# Patient Record
Sex: Female | Born: 1990
Health system: Southern US, Community
[De-identification: ages and names within clinical notes are randomized; demographics above are authoritative.]

## PROBLEM LIST (undated history)

## (undated) DIAGNOSIS — G709 Myoneural disorder, unspecified: Secondary | ICD-10-CM

## (undated) DIAGNOSIS — D573 Sickle-cell trait: Secondary | ICD-10-CM

## (undated) DIAGNOSIS — N189 Chronic kidney disease, unspecified: Secondary | ICD-10-CM

## (undated) DIAGNOSIS — E119 Type 2 diabetes mellitus without complications: Secondary | ICD-10-CM

## (undated) DIAGNOSIS — D649 Anemia, unspecified: Secondary | ICD-10-CM

## (undated) DIAGNOSIS — O1490 Unspecified pre-eclampsia, unspecified trimester: Secondary | ICD-10-CM

## (undated) DIAGNOSIS — O1400 Mild to moderate pre-eclampsia, unspecified trimester: Secondary | ICD-10-CM

## (undated) HISTORY — PX: WISDOM TOOTH EXTRACTION: SHX21

## (undated) HISTORY — DX: Sickle-cell trait: D57.3

## (undated) HISTORY — DX: Mild to moderate pre-eclampsia, unspecified trimester: O14.00

## (undated) HISTORY — DX: Unspecified pre-eclampsia, unspecified trimester: O14.90

## (undated) HISTORY — DX: Type 2 diabetes mellitus without complications: E11.9

---

## 2010-02-20 ENCOUNTER — Emergency Department (HOSPITAL_COMMUNITY): Admission: EM | Admit: 2010-02-20 | Discharge: 2010-02-20 | Payer: Self-pay | Admitting: Emergency Medicine

## 2011-05-17 ENCOUNTER — Inpatient Hospital Stay (HOSPITAL_COMMUNITY): Payer: PRIVATE HEALTH INSURANCE

## 2011-05-17 ENCOUNTER — Inpatient Hospital Stay (HOSPITAL_COMMUNITY)
Admission: EM | Admit: 2011-05-17 | Discharge: 2011-05-23 | DRG: 638 | Disposition: A | Discharge status: Home / Self Care | Payer: PRIVATE HEALTH INSURANCE | Attending: Internal Medicine | Admitting: Internal Medicine

## 2011-05-17 ENCOUNTER — Emergency Department (HOSPITAL_COMMUNITY): Payer: PRIVATE HEALTH INSURANCE

## 2011-05-17 DIAGNOSIS — R112 Nausea with vomiting, unspecified: Secondary | ICD-10-CM | POA: Diagnosis present

## 2011-05-17 DIAGNOSIS — Z794 Long term (current) use of insulin: Secondary | ICD-10-CM

## 2011-05-17 DIAGNOSIS — E871 Hypo-osmolality and hyponatremia: Secondary | ICD-10-CM | POA: Diagnosis present

## 2011-05-17 DIAGNOSIS — E869 Volume depletion, unspecified: Secondary | ICD-10-CM | POA: Diagnosis present

## 2011-05-17 DIAGNOSIS — E875 Hyperkalemia: Secondary | ICD-10-CM | POA: Diagnosis present

## 2011-05-17 DIAGNOSIS — K5900 Constipation, unspecified: Secondary | ICD-10-CM | POA: Diagnosis present

## 2011-05-17 DIAGNOSIS — E1010 Type 1 diabetes mellitus with ketoacidosis without coma: Principal | ICD-10-CM | POA: Diagnosis present

## 2011-05-17 DIAGNOSIS — I10 Essential (primary) hypertension: Secondary | ICD-10-CM | POA: Diagnosis not present

## 2011-05-17 LAB — DIFFERENTIAL
Basophils Absolute: 0 10*3/uL (ref 0.0–0.1)
Basophils Relative: 0 % (ref 0–1)
Lymphocytes Relative: 5 % — ABNORMAL LOW (ref 12–46)
Monocytes Relative: 3 % (ref 3–12)
Neutro Abs: 14.4 10*3/uL — ABNORMAL HIGH (ref 1.7–7.7)
Neutrophils Relative %: 92 % — ABNORMAL HIGH (ref 43–77)

## 2011-05-17 LAB — GLUCOSE, CAPILLARY
Glucose-Capillary: 487 mg/dL — ABNORMAL HIGH (ref 70–99)
Glucose-Capillary: 327 mg/dL — ABNORMAL HIGH (ref 70–99)
Glucose-Capillary: 205 mg/dL — ABNORMAL HIGH (ref 70–99)

## 2011-05-17 LAB — BASIC METABOLIC PANEL
BUN: 8 mg/dL (ref 6–23)
CO2: 12 mEq/L — ABNORMAL LOW (ref 19–32)
Chloride: 105 mEq/L (ref 96–112)
Creatinine, Ser: 0.47 mg/dL — ABNORMAL LOW (ref 0.50–1.10)
GFR calc Af Amer: >60 mL/min (ref >60)
GFR calc non Af Amer: >60 mL/min (ref >60)
Glucose, Bld: 513 mg/dL — ABNORMAL HIGH (ref 70–99)
Potassium: 4.5 mEq/L (ref 3.5–5.1)
Sodium: 131 mEq/L — ABNORMAL LOW (ref 135–145)

## 2011-05-17 LAB — CBC
Hemoglobin: 13.5 g/dL (ref 12.0–15.0)
RBC: 5.03 MIL/uL (ref 3.87–5.11)

## 2011-05-17 LAB — URINALYSIS, ROUTINE W REFLEX MICROSCOPIC
Glucose, UA: >1000 mg/dL — AB
Specific Gravity, Urine: 1.027 (ref 1.005–1.030)
pH: 5 (ref 5.0–8.0)

## 2011-05-17 LAB — URINE MICROSCOPIC-ADD ON

## 2011-05-18 LAB — CARDIAC PANEL(CRET KIN+CKTOT+MB+TROPI)
CK, MB: 1.1 ng/mL (ref 0.3–4.0)
Relative Index: INVALID (ref 0.0–2.5)
Troponin I: <0.3 ng/mL (ref <0.30)

## 2011-05-18 LAB — BASIC METABOLIC PANEL
BUN: 4 mg/dL — ABNORMAL LOW (ref 6–23)
CO2: 19 mEq/L (ref 19–32)
CO2: 22 mEq/L (ref 19–32)
CO2: 23 mEq/L (ref 19–32)
CO2: 24 mEq/L (ref 19–32)
CO2: 24 mEq/L (ref 19–32)
Calcium: 9 mg/dL (ref 8.4–10.5)
Chloride: 101 mEq/L (ref 96–112)
Chloride: 99 mEq/L (ref 96–112)
Chloride: 98 mEq/L (ref 96–112)
Chloride: 98 mEq/L (ref 96–112)
Chloride: 98 mEq/L (ref 96–112)
Creatinine, Ser: <0.47 mg/dL — ABNORMAL LOW (ref 0.50–1.10)
Creatinine, Ser: <0.47 mg/dL — ABNORMAL LOW (ref 0.50–1.10)
Creatinine, Ser: <0.47 mg/dL — ABNORMAL LOW (ref 0.50–1.10)
Glucose, Bld: 138 mg/dL — ABNORMAL HIGH (ref 70–99)
Glucose, Bld: 145 mg/dL — ABNORMAL HIGH (ref 70–99)
Potassium: 3.5 mEq/L (ref 3.5–5.1)
Potassium: 3 mEq/L — ABNORMAL LOW (ref 3.5–5.1)
Potassium: 2.8 mEq/L — ABNORMAL LOW (ref 3.5–5.1)
Sodium: 135 mEq/L (ref 135–145)
Sodium: 134 mEq/L — ABNORMAL LOW (ref 135–145)
Sodium: 133 mEq/L — ABNORMAL LOW (ref 135–145)
Sodium: 132 mEq/L — ABNORMAL LOW (ref 135–145)

## 2011-05-18 LAB — GLUCOSE, CAPILLARY
Glucose-Capillary: 183 mg/dL — ABNORMAL HIGH (ref 70–99)
Glucose-Capillary: 164 mg/dL — ABNORMAL HIGH (ref 70–99)
Glucose-Capillary: 157 mg/dL — ABNORMAL HIGH (ref 70–99)
Glucose-Capillary: 183 mg/dL — ABNORMAL HIGH (ref 70–99)
Glucose-Capillary: 138 mg/dL — ABNORMAL HIGH (ref 70–99)
Glucose-Capillary: 174 mg/dL — ABNORMAL HIGH (ref 70–99)
Glucose-Capillary: 155 mg/dL — ABNORMAL HIGH (ref 70–99)
Glucose-Capillary: 145 mg/dL — ABNORMAL HIGH (ref 70–99)
Glucose-Capillary: 137 mg/dL — ABNORMAL HIGH (ref 70–99)
Glucose-Capillary: 181 mg/dL — ABNORMAL HIGH (ref 70–99)

## 2011-05-18 LAB — HEMOGLOBIN A1C
Hgb A1c MFr Bld: 12.2 % — ABNORMAL HIGH (ref <5.7)
Mean Plasma Glucose: 303 mg/dL — ABNORMAL HIGH (ref <117)

## 2011-05-18 LAB — COMPREHENSIVE METABOLIC PANEL
AST: 19 U/L (ref 0–37)
Albumin: 3.2 g/dL — ABNORMAL LOW (ref 3.5–5.2)
Calcium: 8.6 mg/dL (ref 8.4–10.5)
Creatinine, Ser: 0.52 mg/dL (ref 0.50–1.10)

## 2011-05-18 LAB — DIFFERENTIAL
Basophils Absolute: 0 10*3/uL (ref 0.0–0.1)
Basophils Relative: 0 % (ref 0–1)
Eosinophils Absolute: 0 10*3/uL (ref 0.0–0.7)
Eosinophils Relative: 0 % (ref 0–5)
Lymphocytes Relative: 16 % (ref 12–46)

## 2011-05-18 LAB — TSH: TSH: 0.89 u[IU]/mL (ref 0.350–4.500)

## 2011-05-18 LAB — CBC
Platelets: 285 10*3/uL (ref 150–400)
RDW: 13.3 % (ref 11.5–15.5)
WBC: 11.7 10*3/uL — ABNORMAL HIGH (ref 4.0–10.5)

## 2011-05-18 LAB — MAGNESIUM: Magnesium: 1.8 mg/dL (ref 1.5–2.5)

## 2011-05-19 LAB — GLUCOSE, CAPILLARY
Glucose-Capillary: 113 mg/dL — ABNORMAL HIGH (ref 70–99)
Glucose-Capillary: 131 mg/dL — ABNORMAL HIGH (ref 70–99)
Glucose-Capillary: 145 mg/dL — ABNORMAL HIGH (ref 70–99)
Glucose-Capillary: 146 mg/dL — ABNORMAL HIGH (ref 70–99)
Glucose-Capillary: 150 mg/dL — ABNORMAL HIGH (ref 70–99)
Glucose-Capillary: 158 mg/dL — ABNORMAL HIGH (ref 70–99)
Glucose-Capillary: 207 mg/dL — ABNORMAL HIGH (ref 70–99)
Glucose-Capillary: 271 mg/dL — ABNORMAL HIGH (ref 70–99)
Glucose-Capillary: 315 mg/dL — ABNORMAL HIGH (ref 70–99)
Glucose-Capillary: 41 mg/dL — CL (ref 70–99)
Glucose-Capillary: 183 mg/dL — ABNORMAL HIGH (ref 70–99)
Glucose-Capillary: 142 mg/dL — ABNORMAL HIGH (ref 70–99)
Glucose-Capillary: 143 mg/dL — ABNORMAL HIGH (ref 70–99)
Glucose-Capillary: 162 mg/dL — ABNORMAL HIGH (ref 70–99)
Glucose-Capillary: 175 mg/dL — ABNORMAL HIGH (ref 70–99)
Glucose-Capillary: 187 mg/dL — ABNORMAL HIGH (ref 70–99)
Glucose-Capillary: 190 mg/dL — ABNORMAL HIGH (ref 70–99)
Glucose-Capillary: 257 mg/dL — ABNORMAL HIGH (ref 70–99)

## 2011-05-19 LAB — BASIC METABOLIC PANEL WITH GFR
BUN: 3 mg/dL — ABNORMAL LOW (ref 6–23)
BUN: 3 mg/dL — ABNORMAL LOW (ref 6–23)
BUN: <3 mg/dL — ABNORMAL LOW (ref 6–23)
BUN: <3 mg/dL — ABNORMAL LOW (ref 6–23)
CO2: 24 meq/L (ref 19–32)
CO2: 24 meq/L (ref 19–32)
CO2: 24 meq/L (ref 19–32)
CO2: 25 meq/L (ref 19–32)
Calcium: 8.4 mg/dL (ref 8.4–10.5)
Calcium: 8.5 mg/dL (ref 8.4–10.5)
Calcium: 9.1 mg/dL (ref 8.4–10.5)
Calcium: 9.4 mg/dL (ref 8.4–10.5)
Chloride: 95 meq/L — ABNORMAL LOW (ref 96–112)
Chloride: 96 meq/L (ref 96–112)
Chloride: 96 meq/L (ref 96–112)
Chloride: 98 meq/L (ref 96–112)
Creatinine, Ser: <0.47 mg/dL — ABNORMAL LOW (ref 0.50–1.10)
Creatinine, Ser: <0.47 mg/dL — ABNORMAL LOW (ref 0.50–1.10)
Creatinine, Ser: <0.47 mg/dL — ABNORMAL LOW (ref 0.50–1.10)
Creatinine, Ser: <0.47 mg/dL — ABNORMAL LOW (ref 0.50–1.10)
Glucose, Bld: 150 mg/dL — ABNORMAL HIGH (ref 70–99)
Glucose, Bld: 157 mg/dL — ABNORMAL HIGH (ref 70–99)
Glucose, Bld: 167 mg/dL — ABNORMAL HIGH (ref 70–99)
Glucose, Bld: 176 mg/dL — ABNORMAL HIGH (ref 70–99)
Potassium: 2.9 meq/L — ABNORMAL LOW (ref 3.5–5.1)
Potassium: 3 meq/L — ABNORMAL LOW (ref 3.5–5.1)
Potassium: 3.1 meq/L — ABNORMAL LOW (ref 3.5–5.1)
Potassium: 3.3 meq/L — ABNORMAL LOW (ref 3.5–5.1)
Sodium: 130 meq/L — ABNORMAL LOW (ref 135–145)
Sodium: 132 meq/L — ABNORMAL LOW (ref 135–145)
Sodium: 132 meq/L — ABNORMAL LOW (ref 135–145)
Sodium: 133 meq/L — ABNORMAL LOW (ref 135–145)

## 2011-05-19 LAB — BASIC METABOLIC PANEL
BUN: 3 mg/dL — ABNORMAL LOW (ref 6–23)
BUN: <3 mg/dL — ABNORMAL LOW (ref 6–23)
Chloride: 95 mEq/L — ABNORMAL LOW (ref 96–112)
Chloride: 94 mEq/L — ABNORMAL LOW (ref 96–112)
GFR calc non Af Amer: >60 mL/min (ref >60)
Glucose, Bld: 259 mg/dL — ABNORMAL HIGH (ref 70–99)
Potassium: 2.9 mEq/L — ABNORMAL LOW (ref 3.5–5.1)
Potassium: 2.7 mEq/L — CL (ref 3.5–5.1)
Sodium: 129 mEq/L — ABNORMAL LOW (ref 135–145)

## 2011-05-20 ENCOUNTER — Inpatient Hospital Stay (HOSPITAL_COMMUNITY): Payer: PRIVATE HEALTH INSURANCE

## 2011-05-20 LAB — BASIC METABOLIC PANEL
BUN: <3 mg/dL — ABNORMAL LOW (ref 6–23)
BUN: <3 mg/dL — ABNORMAL LOW (ref 6–23)
BUN: <3 mg/dL — ABNORMAL LOW (ref 6–23)
BUN: <3 mg/dL — ABNORMAL LOW (ref 6–23)
CO2: 23 mEq/L (ref 19–32)
CO2: 23 mEq/L (ref 19–32)
CO2: 20 mEq/L (ref 19–32)
CO2: 23 mEq/L (ref 19–32)
Calcium: 8.2 mg/dL — ABNORMAL LOW (ref 8.4–10.5)
Calcium: 8.2 mg/dL — ABNORMAL LOW (ref 8.4–10.5)
Calcium: 8.4 mg/dL (ref 8.4–10.5)
Chloride: 94 mEq/L — ABNORMAL LOW (ref 96–112)
Chloride: 96 mEq/L (ref 96–112)
Creatinine, Ser: <0.47 mg/dL — ABNORMAL LOW (ref 0.50–1.10)
Creatinine, Ser: <0.47 mg/dL — ABNORMAL LOW (ref 0.50–1.10)
Creatinine, Ser: <0.47 mg/dL — ABNORMAL LOW (ref 0.50–1.10)
Glucose, Bld: 172 mg/dL — ABNORMAL HIGH (ref 70–99)
Glucose, Bld: 208 mg/dL — ABNORMAL HIGH (ref 70–99)
Glucose, Bld: 223 mg/dL — ABNORMAL HIGH (ref 70–99)
Potassium: 3.1 mEq/L — ABNORMAL LOW (ref 3.5–5.1)
Potassium: 3.3 mEq/L — ABNORMAL LOW (ref 3.5–5.1)
Sodium: 131 mEq/L — ABNORMAL LOW (ref 135–145)
Sodium: 132 mEq/L — ABNORMAL LOW (ref 135–145)

## 2011-05-20 LAB — GLUCOSE, CAPILLARY
Glucose-Capillary: 220 mg/dL — ABNORMAL HIGH (ref 70–99)
Glucose-Capillary: 161 mg/dL — ABNORMAL HIGH (ref 70–99)
Glucose-Capillary: 174 mg/dL — ABNORMAL HIGH (ref 70–99)
Glucose-Capillary: 209 mg/dL — ABNORMAL HIGH (ref 70–99)
Glucose-Capillary: 224 mg/dL — ABNORMAL HIGH (ref 70–99)
Glucose-Capillary: 149 mg/dL — ABNORMAL HIGH (ref 70–99)
Glucose-Capillary: 258 mg/dL — ABNORMAL HIGH (ref 70–99)
Glucose-Capillary: 176 mg/dL — ABNORMAL HIGH (ref 70–99)
Glucose-Capillary: 143 mg/dL — ABNORMAL HIGH (ref 70–99)

## 2011-05-20 LAB — CBC
MCH: 26.7 pg (ref 26.0–34.0)
MCV: 74.3 fL — ABNORMAL LOW (ref 78.0–100.0)
Platelets: 305 10*3/uL (ref 150–400)
RDW: 13.2 % (ref 11.5–15.5)

## 2011-05-20 LAB — LIPASE, BLOOD: Lipase: 16 U/L (ref 11–59)

## 2011-05-21 LAB — GLUCOSE, CAPILLARY
Glucose-Capillary: 130 mg/dL — ABNORMAL HIGH (ref 70–99)
Glucose-Capillary: 136 mg/dL — ABNORMAL HIGH (ref 70–99)
Glucose-Capillary: 145 mg/dL — ABNORMAL HIGH (ref 70–99)
Glucose-Capillary: 149 mg/dL — ABNORMAL HIGH (ref 70–99)
Glucose-Capillary: 196 mg/dL — ABNORMAL HIGH (ref 70–99)
Glucose-Capillary: 158 mg/dL — ABNORMAL HIGH (ref 70–99)
Glucose-Capillary: 172 mg/dL — ABNORMAL HIGH (ref 70–99)
Glucose-Capillary: 196 mg/dL — ABNORMAL HIGH (ref 70–99)
Glucose-Capillary: 162 mg/dL — ABNORMAL HIGH (ref 70–99)

## 2011-05-21 LAB — BASIC METABOLIC PANEL
BUN: 4 mg/dL — ABNORMAL LOW (ref 6–23)
BUN: 3 mg/dL — ABNORMAL LOW (ref 6–23)
CO2: 25 mEq/L (ref 19–32)
CO2: 19 mEq/L (ref 19–32)
Calcium: 8.3 mg/dL — ABNORMAL LOW (ref 8.4–10.5)
Calcium: 8.9 mg/dL (ref 8.4–10.5)
Chloride: 97 mEq/L (ref 96–112)
Chloride: 97 mEq/L (ref 96–112)
Chloride: 98 mEq/L (ref 96–112)
Chloride: 97 mEq/L (ref 96–112)
Creatinine, Ser: 0.54 mg/dL (ref 0.50–1.10)
Creatinine, Ser: <0.47 mg/dL — ABNORMAL LOW (ref 0.50–1.10)
Glucose, Bld: 314 mg/dL — ABNORMAL HIGH (ref 70–99)
Potassium: 2.9 mEq/L — ABNORMAL LOW (ref 3.5–5.1)
Potassium: 3.8 mEq/L (ref 3.5–5.1)
Sodium: 131 mEq/L — ABNORMAL LOW (ref 135–145)

## 2011-05-21 LAB — CBC
HCT: 33.8 % — ABNORMAL LOW (ref 36.0–46.0)
MCH: 26.9 pg (ref 26.0–34.0)
MCV: 74.6 fL — ABNORMAL LOW (ref 78.0–100.0)
RDW: 13 % (ref 11.5–15.5)
WBC: 11.1 10*3/uL — ABNORMAL HIGH (ref 4.0–10.5)

## 2011-05-22 LAB — GLUCOSE, CAPILLARY
Glucose-Capillary: 101 mg/dL — ABNORMAL HIGH (ref 70–99)
Glucose-Capillary: 113 mg/dL — ABNORMAL HIGH (ref 70–99)
Glucose-Capillary: 149 mg/dL — ABNORMAL HIGH (ref 70–99)
Glucose-Capillary: 165 mg/dL — ABNORMAL HIGH (ref 70–99)
Glucose-Capillary: 215 mg/dL — ABNORMAL HIGH (ref 70–99)
Glucose-Capillary: 474 mg/dL — ABNORMAL HIGH (ref 70–99)
Glucose-Capillary: 512 mg/dL — ABNORMAL HIGH (ref 70–99)

## 2011-05-22 LAB — BASIC METABOLIC PANEL
CO2: 23 mEq/L (ref 19–32)
Chloride: 101 mEq/L (ref 96–112)
Glucose, Bld: 144 mg/dL — ABNORMAL HIGH (ref 70–99)
Potassium: 2.8 mEq/L — ABNORMAL LOW (ref 3.5–5.1)
Sodium: 136 mEq/L (ref 135–145)

## 2011-05-23 LAB — GLUCOSE, CAPILLARY
Glucose-Capillary: 154 mg/dL — ABNORMAL HIGH (ref 70–99)
Glucose-Capillary: 271 mg/dL — ABNORMAL HIGH (ref 70–99)

## 2011-05-23 LAB — BASIC METABOLIC PANEL
Calcium: 9.1 mg/dL (ref 8.4–10.5)
Calcium: 9 mg/dL (ref 8.4–10.5)
Chloride: 100 mEq/L (ref 96–112)
Creatinine, Ser: <0.47 mg/dL — ABNORMAL LOW (ref 0.50–1.10)
Sodium: 132 mEq/L — ABNORMAL LOW (ref 135–145)
Sodium: 131 mEq/L — ABNORMAL LOW (ref 135–145)

## 2011-05-26 NOTE — H&P (Signed)
Kimberly [name], Kimberly [name]             ACCOUNT NO.:  [id number]  MEDICAL RECORD NO.:  [id number]  LOCATION:  [id number]                         FACILITY:  [facility]  PHYSICIAN:  Tujuana Kilmartin, DO         DATE OF BIRTH:  1990-09-26  DATE OF ADMISSION:  05/17/2011 DATE OF DISCHARGE:                             HISTORY & PHYSICAL   CHIEF COMPLAINT:  Abdominal pain, nausea, and vomiting.  HISTORY OF PRESENT ILLNESS:  Patient is a 20 year old female, who has type 1 diabetes mellitus.  She woke this morning with nausea, vomiting, has developed abdominal pain.  Patient denies any fevers, chills, hematemesis, coffee-ground emesis, constipation, or diarrhea.  PAST MEDICAL HISTORY:  Significant only for type 1 diabetes mellitus.  PAST SURGICAL HISTORY:  None.  MEDICATIONS AT HOME:  Include: 1. Tylenol Extra Strength 500 mg 2 p.o. q.6 hours p.r.n., pain. 2. NovoLog 30 to 40 units subcu 4 times daily. 3. Lantus 26 units subcu at 6 p.m. daily.  ALLERGIES:  NKDA.  SOCIAL:  No tobacco, no alcohol, no recreational drug use.  FAMILY HISTORY:  Positive for type 1 diabetes mellitus.  REVIEW OF SYSTEMS:  CONSTITUTIONAL:  Negative for fever.  Negative for chills.  Negative for weakness.  Negative for fatigue.  CNS:  No headaches, no seizures, or limb weakness.  ENT:  No nasal congestion. No throat pain.  No coryza.  CARDIOVASCULAR:  No chest pain.  No palpitations or orthopnea.  RESPIRATORY:  No cough.  No shortness of breath.  No wheezing.  GASTROINTESTINAL:  Positive for nausea.  Positive for vomiting.  Positive for abdominal pain.  Negative for constipation. Negative for diarrhea.  GENITOURINARY:  No dysuria, no hematuria, no urinary frequency.  RENAL:  No flank pain, no swelling, no pruritus. SKIN:  No rashes, no sores or lesions.  HEMATOLOGIC:  No easy bruising. No purpura or clots.  LYMPHS:  No lymphadenopathy.  No palpable lymph nodes.  No specific lymph swelling.  PSYCHIATRIC:  No anxiety.   No depression or insomnia.  PHYSICAL EXAMINATION:  VITAL SIGNS:  Temperature 98.4, pulse 96, respiratory rate 18, blood pressure 150/92. GENERAL:  Patient is awake, alert, and ordered x3, in no acute distress. EYES:  Pupils are equal, round, and reactive to light and accommodation. External ocular movements are bilaterally intact.  Sclerae nonicteric, noninjected. MOUTH:  Oral mucous is dry.  No lesions or sores.  Pharynx is clean.  No erythema.  No exudate. NECK:  Negative for JVD.  Negative for thyromegaly.  Negative for lymphadenopathy. HEART:  Regular rate and rhythm at 90 beats per minute without murmur, ectopy, or gallops.  No lateral PMI.  No thrills. LUNGS:  Clear to auscultation bilaterally without wheezes, rales, or rhonchi.  No increase work of breathing.  No tactile fremitus. ABDOMEN:  Soft, nontender, nondistended.  Positive bowel sounds.  No hepatosplenomegaly.  No hernias palpated. EXTREMITIES:  Negative for cyanosis, clubbing, or edema.  Patient has positive dorsalis pedis and popliteal pulses bilaterally.  No carotid bruits bilaterally. NEUROLOGIC:  Cranial nerves II through XII gross intact.  Motor and sensory intact.  LABORATORY DATA:  WBC is 15.7, hemoglobin 13.5, hematocrit 38.8, platelets 292.  Sodium 131,  potassium 4.5, chloride 93, CO2 is 12, glucose is 513, creatinine 0.57, calcium 9.1.  Urinalysis shows greater than 1000 urine glucose, 80 ketones, trace of blood, no nitrites, no leukocyte esterase.  Chest x-ray shows no active cardiopulmonary disease.  ASSESSMENT: 1. Diabetic ketoacidosis, patient attributes this to a recent change     and her insulin from Humalog to NovoLog. 2. Severe metabolic acidosis. 3. Volume depletion. 4. Hyponatremia.  PLAN: 1. Admit to Step-Down. 2. IV insulin as per DKA protocol. 3. IV fluids. 4. Monitor electrolytes.  I spent 32 minutes on this admission.          ______________________________ Karie Kirks,  DO     AS/MEDQ  D:  05/17/2011  T:  05/18/2011  Job:  [id number]  cc:   Dr. Toney Rakes. [place], [place].  Electronically Signed by Karie Kirks DO on 05/26/2011 02:21:42 PM

## 2011-06-05 NOTE — Discharge Summary (Addendum)
Kimberly [name], Kimberly [name] NO.:  [id number]  MEDICAL RECORD NO.:  [id number]  LOCATION:  [id number]                         FACILITY:  [facility]  PHYSICIAN:  Leana Gamer, MDDATE OF BIRTH:  1991-01-16  DATE OF ADMISSION:  05/17/2011 DATE OF DISCHARGE:  05/23/2011                        DISCHARGE SUMMARY - REFERRING   PRIMARY CARE PROVIDER:  Jeanann Lewandowsky, MD, with Endocrinology.  DISCHARGE DIAGNOSES: 1. Diabetic ketoacidosis. 2. Persistent nausea, vomiting. 3. Hyperkalemia. 4. Hyponatremia. 5. Metabolic acidosis. 6. Gastric bloating secondary to constipation. 7. Hypertension.  DISCHARGE MEDICATIONS: 1. Insulin NovoLog FlexPen 1 unit per 10 g of carbohydrates meal     coverage.  Subcutaneously t.i.d. with meals. 2. NovoLog FlexPen 1-20 units subcutaneously 3 times a day as follows     CBG 70-120 zero units, CBG 121-150 give 3 units, CBG 151-200 give 4     units, CBG 201-250 give 7 units, CBG 251-300 give 11 units, CBG 301-     350 give 15 units, CBG 351-400 give 20 units, CBG greater than 400     call physician. 3. MiraLax 17 g p.o. daily as needed for constipation. 4. Lantus 26 units subcutaneously at 6 p.m. daily. 5. Tylenol Extra Strength 500 mg 2 tablets p.o. every 6 hours as     needed.  DIAGNOSTIC LABORATORY DATA:  WBC 15.7, hemoglobin 13.5, hematocrit 38.8, MCV 77.1, platelets 292, neutrophils 92, absolute neutrophils 14.4. Sodium 131, potassium 4.5, chloride 93, CO2 of 12, BUN 10, creatinine 0.57, glucose 513.  Urinalysis shows greater than 80 ketones, greater than 1000 glucose, otherwise negative.  Urine pregnancy test is negative.  Magnesium is 1.8, total CK 51, CK-MB 1.1, troponin 1 less than 0.30.  BNP 381.1.  TSH is 0.890.  Hemoglobin A1c is 12.2.  Amylase is 37, lipase is 16.  DIAGNOSTIC IMAGING: 1. Chest x-ray on September 18th, yields no active cardiopulmonary     disease. 2. Acute abdominal series done on September 21st, yields benign-   appearing abdomen.  No evidence to suggest ileus.  CONSULTATIONS:  None.  PROCEDURES:  None.  BRIEF HISTORY:  Kimberly [name] is a 20 year old female with a history of type 1 diabetes.  She awakened on the morning of September 18th feeling nausea, abdominal pain, and reports vomiting several times.  She denied fever, chills, coffee-ground emesis, constipation, or diarrhea.  Her symptoms persisted and worsened, so she came to the emergency room and [facility] were asked to admit for further evaluation and treatment.  HOSPITAL COURSE BY PROBLEM: 1. Diabetic ketoacidosis, presumably secondary to recent change in her     insulin from Humalog to NovoLog due to stipulations of the     insurance company.  The patient's glucose greater than 500 and she     was in metabolic acidosis.  She was admitted to Telemetry, started     on the Glucommander.  Her labs were followed closely.  She remained     on Glucommander until September 22nd secondary to persistent nausea     and vomiting and inability to eat.  On September 23rd, she was     transitioned over to 26 units of Lantus meal coverage of 2 units  per 15 of carb and a moderate sliding scale.  At the time of     discharge, the patient's coverage includes 26 of Lantus     subcutaneous meal coverage of 1 unit per 10 g of carbs and a     resistant sliding scale insulin regimen, dictated above.  The     patient will follow up with Dr. Carlis Abbott in 1 week. 2. Persistent nausea and vomiting secondary to diabetic ketoacidosis     as well as constipation.  On September 22nd, the patient complained     of some bloating and reported no BM for 5 days.  She was provided     with laxatives with good results.  Shortly thereafter, her nausea     subsided and her diet was advanced.  She had no further episodes of     nausea or vomiting. 3. Hypercalcemia secondary to diabetic ketoacidosis.  Potassium was     monitored closely and repleted according to the  DKA protocol. 4. Hyponatremia secondary to diabetic ketoacidosis.  Fluids managed     per DKA protocol.  At the time of discharge, the patient's sodium     is 132. 5. Gastric bloating secondary to constipation.  The patient was     provided with Dulcolax suppository with good results.  She will be     sent home on MiraLax 17 g p.o. p.r.n. constipation.  She had been     educated to proper diet to avoid constipation. 6. Hypertension.  During her hospitalization, the patient had a few     episodes of hypertension.  At the time of discharge that is     resolved.  No antihypertensives are needed at the time of     discharge.  The patient to follow up with Dr. Carlis Abbott for further     monitoring of her blood pressure.  PHYSICAL EXAMINATION:  VITAL SIGNS:  Temperature 98.0, blood pressure 104/68, heart rate 88, respiratory rate 16, saturation 100% on room air. GENERAL:  Awake, alert, well nourished, no acute distress. CV:  Heart, regular rate and rhythm.  No murmur, gallop, or rub.  No lower extremity edema. RESPIRATORY:  Normal effort.  Breath sounds clear to auscultation bilaterally.  No rhonchi, wheezes, or rales. ABDOMEN:  Round, soft, positive bowel sounds throughout, nontender to palpation. NEURO:  Alert and oriented x3.  Speech clear.  Facial symmetry.  DIET:  Carb modified.  ACTIVITY:  As tolerated.  FOLLOWUP:  The patient has an appointment with Dr. Jeanann Lewandowsky on September 26th at 10 a.m.  DISPOSITION:  The patient is medically stable and ready for discharge.  Time spent on this discharge 40 minutes.     Radene Gunning, NP   ______________________________ Leana Gamer, MD    KMB/MEDQ  D:  05/23/2011  T:  05/23/2011  Job:  [id number]  cc:   Jeanann Lewandowsky, M.D. Fax: [phone]  Electronically Signed by Dyanne Carrel  on 06/05/2011 08:25:50 AM Electronically Signed by Liston Alba MD on 06/09/2011 07:27:41 AM

## 2011-06-09 ENCOUNTER — Emergency Department (HOSPITAL_COMMUNITY): Payer: No Typology Code available for payment source

## 2011-06-09 ENCOUNTER — Emergency Department (HOSPITAL_COMMUNITY)
Admission: EM | Admit: 2011-06-09 | Discharge: 2011-06-10 | Disposition: A | Discharge status: Home / Self Care | Payer: No Typology Code available for payment source | Attending: Emergency Medicine | Admitting: Emergency Medicine

## 2011-06-09 DIAGNOSIS — Z794 Long term (current) use of insulin: Secondary | ICD-10-CM | POA: Insufficient documentation

## 2011-06-09 DIAGNOSIS — E119 Type 2 diabetes mellitus without complications: Secondary | ICD-10-CM | POA: Insufficient documentation

## 2011-06-09 DIAGNOSIS — S335XXA Sprain of ligaments of lumbar spine, initial encounter: Secondary | ICD-10-CM | POA: Insufficient documentation

## 2011-06-09 DIAGNOSIS — S139XXA Sprain of joints and ligaments of unspecified parts of neck, initial encounter: Secondary | ICD-10-CM | POA: Insufficient documentation

## 2011-06-09 DIAGNOSIS — Y92410 Unspecified street and highway as the place of occurrence of the external cause: Secondary | ICD-10-CM | POA: Insufficient documentation
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
## 2013-04-30 ENCOUNTER — Ambulatory Visit (INDEPENDENT_AMBULATORY_CARE_PROVIDER_SITE_OTHER): Payer: PRIVATE HEALTH INSURANCE | Admitting: Physician Assistant

## 2013-04-30 VITALS — BP 132/70 | HR 90 | Temp 98.9°F | Resp 18 | Ht 65.0 in | Wt 165.0 lb

## 2013-04-30 DIAGNOSIS — N912 Amenorrhea, unspecified: Secondary | ICD-10-CM

## 2013-04-30 NOTE — Progress Notes (Signed)
  Subjective:    Patient ID: Kimberly [name], female    DOB: [birth year], 22 y.o.   MRN: [id number]  HPI This 22 y.o. female presents for pregnancy test.  LMP was 03/28/2013. She performed 2 tests at home, one was positive, one was negative.  She developed cramping and vaginal bleeding today.    Was not planning pregnancy. Reports consistent condom use.  Isn't sure she wants to stay pregnant.   She has type 1 DM, managed on Lantus 32 units QD and sliding scale Novolog.  Sees Dr. Jeanann Lewandowsky.  No increased fatigue, nausea, breast tenderness.  Review of Systems No chest pain, SOB, HA, dizziness, vision change, diarrhea, constipation, dysuria, urinary urgency or frequency, myalgias, arthralgias or rash.     Objective:   Physical Exam Blood pressure 132/70, pulse 90, temperature 98.9 F (37.2 C), temperature source Oral, resp. rate 18, height 5\' 5"  (1.651 m), weight 165 lb (74.844 kg), last menstrual period 03/28/2013, SpO2 100.00%. Body mass index is 27.46 kg/(m^2). Well-developed, well nourished BF who is awake, alert and oriented, in NAD. HEENT: [place]/AT, sclera and conjunctiva are clear.   Neck: supple, non-tender, no lymphadenopathy, thyromegaly. Heart: RRR, no murmur Lungs: normal effort, CTA Extremities: no cyanosis, clubbing or edema. Skin: warm and dry without rash. Psychologic: good mood and appropriate affect, normal speech and behavior.   Results for orders placed in visit on 04/30/13  POCT URINE PREGNANCY      Result Value Range   Preg Test, Ur Positive         Assessment & Plan:  Amenorrhea - Plan: POCT urine pregnancy, hCG, quantitative, pregnancy; possible early miscarriage.  RTC 48 hours for repeat serum Quant hCG.   Information for pregnancy services and pregnancy termination options. Stop Lantus. Call Dr. Carlis Abbott in the morning. If she chooses to remain pregnant, advised to establish with OBGYN ([facility] recommended) and start prenatal  vitamin.  Fara Chute, PA-C Physician Assistant-Certified [facility]

## 2013-04-30 NOTE — Patient Instructions (Addendum)
Based on the date of your last menstrual period, today you are 4 weeks and 4 days pregnant. The estimated date of delivery is Jan 01, 2014. It is possible that the bleeding that you are experiencing is due to a miscarriage. Some women experience monthly bleeding during pregnancy.  If you choose to remain pregnant, you need to select an OB/GYN. You have many excellent choices in [place], and generally do not need a referral.  The [facility] a the [facility] is the best choice for high risk pregnancies. Please begin taking a daily prenatal vitamin. Stop the Lantus Insulin and use the Novolog sliding scale.  Call Dr. Ainsley Spinner office tomorrow to discuss management of your diabetes.

## 2013-05-04 ENCOUNTER — Ambulatory Visit (INDEPENDENT_AMBULATORY_CARE_PROVIDER_SITE_OTHER): Payer: PRIVATE HEALTH INSURANCE | Admitting: Physician Assistant

## 2013-05-04 VITALS — BP 116/74 | HR 89 | Temp 97.5°F | Resp 16 | Ht 64.25 in | Wt 164.0 lb

## 2013-05-04 DIAGNOSIS — N949 Unspecified condition associated with female genital organs and menstrual cycle: Secondary | ICD-10-CM

## 2013-05-04 DIAGNOSIS — O209 Hemorrhage in early pregnancy, unspecified: Secondary | ICD-10-CM

## 2013-05-04 DIAGNOSIS — O4690 Antepartum hemorrhage, unspecified, unspecified trimester: Secondary | ICD-10-CM

## 2013-05-04 DIAGNOSIS — R102 Pelvic and perineal pain: Secondary | ICD-10-CM

## 2013-05-04 DIAGNOSIS — N912 Amenorrhea, unspecified: Secondary | ICD-10-CM

## 2013-05-04 NOTE — Patient Instructions (Signed)
I will contact you with your lab results as soon as they are available.   If you have not heard from me in 5 days, please contact me.  The fastest way to get your results is to register for My Chart (see the instructions on the last page of this printout).

## 2013-05-04 NOTE — Progress Notes (Signed)
  Subjective:    Patient ID: Kimberly [name], female    DOB: [birth year], 22 y.o.   MRN: [id number]  HPI  This 22 y.o. female presents for evaluation of serum Quant HCG.  Was seen 04/30/2013 with missed menses.  UCG was positive and she noted menstrual cramping and spotting.  She had no nausea, breast tenderness or increased fatigue.  She was advised to stop Lantus and talk with her endocrinologist about alternative treatments, as well as the risks of pregnancy given her type 1 DM.  Results for orders placed in visit on 04/30/13  HCG, QUANTITATIVE, PREGNANCY      Result Value Range   hCG, Beta Chain, Quant, S 71491.1    POCT URINE PREGNANCY      Result Value Range   Preg Test, Ur Positive     She was not planning pregnancy, and did not desire pregnancy.  She has discussed the risk of pregnancy with Dr. Carlis Abbott, who has advised her that she is high risk with pregnancy.  She is still having pelvic cramping and intermittent vaginal bleeding.   No nausea, vomiting, HA, dizziness.  No increased fatigue or breast tenderness.   Review of Systems As above.    Objective:   Physical Exam Blood pressure 116/74, pulse 89, temperature 97.5 F (36.4 C), temperature source Oral, resp. rate 16, height 5' 4.25" (1.632 m), weight 164 lb (74.39 kg), last menstrual period 03/28/2013, SpO2 99.00%. Body mass index is 27.93 kg/(m^2). Well-developed, well nourished BF who is awake, alert and oriented, in NAD. HEENT: [place]/AT, sclera and conjunctiva are clear.   Heart: Regular rate Lungs: normal effort Extremities: no cyanosis, clubbing or edema. Skin: warm and dry without rash. Psychologic: good mood and appropriate affect, normal speech and behavior.        Assessment & Plan:  Amenorrhea - Plan: hCG, quantitative, pregnancy  Pelvic cramping  Vaginal bleeding in pregnancy, first trimester  Suspect miscarriage.  Await repeat serum Quant HCG.  If not doubled, will refer to OBGYN to discuss D&C. If the  level has doubled, she plans to terminate the pregnancy and already has a list of clinics.  Fara Chute, PA-C Physician Assistant-Certified [facility]

## 2013-05-06 NOTE — Addendum Note (Signed)
Addended by: Fara Chute on: 05/06/2013 04:51 PM   Modules accepted: Orders

## 2013-05-07 ENCOUNTER — Encounter: Payer: Self-pay | Admitting: Physician Assistant

## 2013-05-07 ENCOUNTER — Encounter: Payer: Self-pay | Admitting: Obstetrics & Gynecology

## 2013-05-09 ENCOUNTER — Encounter: Payer: Self-pay | Admitting: *Deleted

## 2013-05-20 ENCOUNTER — Ambulatory Visit (HOSPITAL_COMMUNITY)
Admission: RE | Admit: 2013-05-20 | Discharge: 2013-05-20 | Disposition: A | Discharge status: Home / Self Care | Payer: PRIVATE HEALTH INSURANCE | Source: Ambulatory Visit | Attending: Obstetrics & Gynecology | Admitting: Obstetrics & Gynecology

## 2013-05-20 ENCOUNTER — Other Ambulatory Visit: Payer: Self-pay | Admitting: Obstetrics & Gynecology

## 2013-05-20 ENCOUNTER — Ambulatory Visit (INDEPENDENT_AMBULATORY_CARE_PROVIDER_SITE_OTHER): Payer: Medicaid Other | Admitting: Obstetrics & Gynecology

## 2013-05-20 ENCOUNTER — Encounter: Payer: PRIVATE HEALTH INSURANCE | Attending: Obstetrics & Gynecology | Admitting: *Deleted

## 2013-05-20 ENCOUNTER — Encounter: Payer: Self-pay | Admitting: Obstetrics & Gynecology

## 2013-05-20 DIAGNOSIS — E109 Type 1 diabetes mellitus without complications: Secondary | ICD-10-CM | POA: Insufficient documentation

## 2013-05-20 DIAGNOSIS — E119 Type 2 diabetes mellitus without complications: Secondary | ICD-10-CM | POA: Insufficient documentation

## 2013-05-20 DIAGNOSIS — O24311 Unspecified pre-existing diabetes mellitus in pregnancy, first trimester: Secondary | ICD-10-CM

## 2013-05-20 DIAGNOSIS — O24919 Unspecified diabetes mellitus in pregnancy, unspecified trimester: Secondary | ICD-10-CM

## 2013-05-20 DIAGNOSIS — O3680X0 Pregnancy with inconclusive fetal viability, not applicable or unspecified: Secondary | ICD-10-CM | POA: Insufficient documentation

## 2013-05-20 DIAGNOSIS — Z713 Dietary counseling and surveillance: Secondary | ICD-10-CM | POA: Insufficient documentation

## 2013-05-20 DIAGNOSIS — O24319 Unspecified pre-existing diabetes mellitus in pregnancy, unspecified trimester: Secondary | ICD-10-CM | POA: Insufficient documentation

## 2013-05-20 DIAGNOSIS — Z3689 Encounter for other specified antenatal screening: Secondary | ICD-10-CM | POA: Insufficient documentation

## 2013-05-20 LAB — POCT URINALYSIS DIP (DEVICE)
Bilirubin Urine: NEGATIVE
Glucose, UA: 500 mg/dL — AB
Ketones, ur: NEGATIVE mg/dL
Specific Gravity, Urine: >=1.03 (ref 1.005–1.030)

## 2013-05-20 LAB — HIV ANTIBODY (ROUTINE TESTING W REFLEX): HIV: NONREACTIVE

## 2013-05-20 NOTE — Progress Notes (Signed)
DIABETES: Type I DM since age of 22yo. Is presently using Lantus  and Novolog. Has [place] today and met with me to discuss insulin management. We are attempting to go forward with obtaining [employer] Insulin Pump for indefinite DM management. I spoke with Roxann with [employer] and qualifying labs were ordered. Patient spoke with Roxann and insurance information exchanged. Patient is presently covered on her father's insurance. He is not aware of her pregnancy. She intends to discuss pregnancy and pump with him today.I will see her next Monday to discuss management of Lantus. She will need to see Nutrition and Social Work at that time. Reviewed dietary quidelines. Patient states she does the best she can with financial constraints.  Plan:  Aim for 2 Carb Choices per meal (30 grams) +/- 1 either way for breakfast, 3 Carb choices for lunch and dinner Aim for 0-1 Carbs per snack if hungry  Consider reading food labels for Total Carbohydrate and Fat Grams of foods Consider checking BG fasting and 2hpp per day as directed by MD

## 2013-05-20 NOTE — Patient Instructions (Signed)
Return to clinic for any obstetric concerns or go to [facility] for evaluation  

## 2013-05-20 NOTE — Progress Notes (Signed)
New ob packet given  Weight gain 25-35lbs

## 2013-05-20 NOTE — Progress Notes (Signed)
Patient did not see MD today, will be evaluated next visit. DM education, intake, labs today. Of note, she had OB u/s today that showed [redacted]w[redacted]d IUP.

## 2013-05-21 ENCOUNTER — Ambulatory Visit (INDEPENDENT_AMBULATORY_CARE_PROVIDER_SITE_OTHER): Payer: Medicaid Other | Admitting: Obstetrics & Gynecology

## 2013-05-21 ENCOUNTER — Other Ambulatory Visit: Payer: Self-pay | Admitting: Obstetrics & Gynecology

## 2013-05-21 ENCOUNTER — Encounter: Payer: Self-pay | Admitting: Obstetrics & Gynecology

## 2013-05-21 VITALS — BP 127/89 | Temp 98.0°F | Wt 166.9 lb

## 2013-05-21 DIAGNOSIS — O24319 Unspecified pre-existing diabetes mellitus in pregnancy, unspecified trimester: Secondary | ICD-10-CM

## 2013-05-21 DIAGNOSIS — E119 Type 2 diabetes mellitus without complications: Secondary | ICD-10-CM

## 2013-05-21 DIAGNOSIS — O24919 Unspecified diabetes mellitus in pregnancy, unspecified trimester: Secondary | ICD-10-CM

## 2013-05-21 LAB — OBSTETRIC PANEL
Basophils Absolute: 0 10*3/uL (ref 0.0–0.1)
Eosinophils Relative: 1 % (ref 0–5)
Lymphocytes Relative: 22 % (ref 12–46)
MCV: 77.9 fL — ABNORMAL LOW (ref 78.0–100.0)
Neutro Abs: 6.3 10*3/uL (ref 1.7–7.7)
Platelets: 308 10*3/uL (ref 150–400)
RDW: 14.1 % (ref 11.5–15.5)
Rubella: 0.42 Index (ref <0.90)
WBC: 8.9 10*3/uL (ref 4.0–10.5)

## 2013-05-21 LAB — COMPREHENSIVE METABOLIC PANEL
Albumin: 3.5 g/dL (ref 3.5–5.2)
BUN: 9 mg/dL (ref 6–23)
CO2: 22 mEq/L (ref 19–32)
Calcium: 9.1 mg/dL (ref 8.4–10.5)
Chloride: 101 mEq/L (ref 96–112)
Glucose, Bld: 178 mg/dL — ABNORMAL HIGH (ref 70–99)
Potassium: 4.2 mEq/L (ref 3.5–5.3)

## 2013-05-21 LAB — CULTURE, OB URINE: Colony Count: 30000

## 2013-05-21 LAB — HEMOGLOBIN A1C
Hgb A1c MFr Bld: 9 % — ABNORMAL HIGH (ref <5.7)
Mean Plasma Glucose: 212 mg/dL — ABNORMAL HIGH (ref <117)

## 2013-05-21 LAB — POCT URINALYSIS DIP (DEVICE)
Ketones, ur: NEGATIVE mg/dL
Leukocytes, UA: NEGATIVE
Nitrite: NEGATIVE
Protein, ur: 100 mg/dL — AB
Urobilinogen, UA: 1 mg/dL (ref 0.0–1.0)

## 2013-05-21 LAB — OB RESULTS CONSOLE GC/CHLAMYDIA
CHLAMYDIA, DNA PROBE: NEGATIVE
GC PROBE AMP, GENITAL: NEGATIVE

## 2013-05-21 LAB — C-PEPTIDE: C-Peptide: <0.1 ng/mL — ABNORMAL LOW (ref 0.80–3.90)

## 2013-05-21 NOTE — Progress Notes (Signed)
   Subjective:    Kimberly [name] is a G56P0 [redacted]w[redacted]d being seen today for her first obstetrical visit.  Her obstetrical history is significant for Type one DM--difficult to control. Patient does intend to breast feed. Pregnancy history fully reviewed.  Patient reports no complaints.  Filed Vitals:   05/21/13 1010  BP: 127/89  Temp: 98 F (36.7 C)  Weight: 166 lb 14.4 oz (75.705 kg)    HISTORY: OB History  Gravida Para Term Preterm AB SAB TAB Ectopic Multiple Living  1             # Outcome Date GA Lbr Len/2nd Weight Sex Delivery Anes PTL Lv  1 CUR              Past Medical History  Diagnosis Date  . Diabetes mellitus without complication   . Sickle cell trait    Past Surgical History  Procedure Laterality Date  . Wisdom tooth extraction     Family History  Problem Relation Age of Onset  . Lupus Mother   . Aneurysm Mother      Exam    Uterus:     Pelvic Exam:    Perineum: No Hemorrhoids   Vulva: normal   Vagina:  normal mucosa   pH: n/a   Cervix: nulliparous, bleeding after pap smear   Adnexa: normal adnexa   Bony Pelvis: average  System: Breast:  normal appearance, no masses or tenderness   Skin: normal coloration and turgor, no rashes    Neurologic: oriented, normal mood   Extremities: no erythema, induration, or nodules   HEENT sclera clear, anicteric, oropharynx clear, no lesions, neck supple with midline trachea and thyroid without masses   Mouth/Teeth mucous membranes moist, pharynx normal without lesions and dental hygiene good   Neck supple and no masses   Cardiovascular: regular rate and rhythm   Respiratory:  appears well, vitals normal, no respiratory distress, acyanotic, normal RR, chest clear, no wheezing, crepitations, rhonchi, normal symmetric air entry   Abdomen: soft, non-tender; bowel sounds normal; no masses,  no organomegaly   Urinary: urethral meatus normal      Assessment:    Pregnancy: G1P0 Patient Active Problem List   Diagnosis Date Noted  . Preexisting diabetes complicating pregnancy, antepartum 05/20/2013  . Diabetes mellitus type 1 04/30/2013        Plan:     Initial labs drawn. Prenatal vitamins. Problem list reviewed and updated. Genetic Screening discussed First Screen: declined.  Ultrasound discussed; fetal survey: willorder at next visit..  Follow up in 1 weeks. Pap smear, cultures Pt having CBG from 56-286.  Pt does carb counting and adjsutments, but she still remains high.  Pt is applying for a pump and wil meet with Gwen Pounds on Monday or see Dr. Bubba Camp who will manage her pump.   24 hour urine TSH CMP optho Echo at 22 weeks Pt declines genetic testing at this time.   Bina Veenstra H. 05/21/2013

## 2013-05-21 NOTE — Progress Notes (Signed)
Pulse 95 Pt reports she had some spotting a few weeks ago but none since then.  Was seen last week for intake and diabetes education. Not seen by MD.

## 2013-05-22 LAB — TSH: TSH: 1.618 u[IU]/mL (ref 0.350–4.500)

## 2013-05-22 LAB — GC/CHLAMYDIA PROBE AMP: GC Probe RNA: NEGATIVE

## 2013-05-23 ENCOUNTER — Encounter: Payer: Self-pay | Admitting: *Deleted

## 2013-05-24 LAB — PROTEIN, URINE, 24 HOUR: Protein, Urine: 42 mg/dL

## 2013-05-25 ENCOUNTER — Encounter: Payer: Self-pay | Admitting: Obstetrics & Gynecology

## 2013-05-25 DIAGNOSIS — E1021 Type 1 diabetes mellitus with diabetic nephropathy: Secondary | ICD-10-CM | POA: Insufficient documentation

## 2013-05-27 ENCOUNTER — Encounter: Payer: PRIVATE HEALTH INSURANCE | Admitting: *Deleted

## 2013-05-27 ENCOUNTER — Ambulatory Visit: Payer: Medicaid Other | Admitting: *Deleted

## 2013-05-27 ENCOUNTER — Telehealth: Payer: Self-pay | Admitting: General Practice

## 2013-05-27 DIAGNOSIS — O24919 Unspecified diabetes mellitus in pregnancy, unspecified trimester: Secondary | ICD-10-CM

## 2013-05-27 MED ORDER — INSULIN NPH (HUMAN) (ISOPHANE) 100 UNIT/ML ~~LOC~~ SUSP: SUBCUTANEOUS | Status: DC

## 2013-05-27 NOTE — Telephone Encounter (Signed)
Patient called and left message stating she was supposed to get started on NPH insulin tomorrow but when she went to pick it up from her pharmacy she was told the medication needed a pre-auth and to call us. Patient would like a call back once this is done.

## 2013-05-27 NOTE — Progress Notes (Signed)
DIABETES Patient is T1DM since a child.  She awoke at 0400 this date with a glucose reading of 50 mg/dl. She had 8 0z juice, at 0749 her glucose of 55 mg/dl. She ate 2 waffles and had a 2hpp of 253. On other  Dates she treats her low FBS with 8oz of OJ resulting  Glucose of 343. We discussed treating low readings Rule of 15. She verbalized understanding. I consulted Rosita Kea RN, CDE, Diabetes Coordinator and the following changes have been initiated with the approval of Dr. Gala Romney. Referral pending to Dr. Chalmers Cater. Pt to be contacted at [phone] Discontinue Lantus. NPH 10U AM & HS, she will also utilize this with 2hpp readings. 91-120 3u 121-160 4u 161-200 6u 201-240 9u 241-280 12u 281-320 15u 321-360 18u 361-400 21u > 400 24u and Call MD  Cover Meals with 1:8 ratio Nutrition instructions: NO fruit juice except to treat low reading!, No fruit until after lunch, treat lows with 4 0z of juice or regular soda.

## 2013-05-27 NOTE — Progress Notes (Signed)
Phoned Dr. Nash Dimmer office to request appointment.  Unable to speak with anyone.  Left a message to please call me with appointment and I would notify patient.  Records faxed to Dr. Nash Dimmer office for review.  Called back to Dr. Almetta Lovely office and left another message.  Still unable to speak with anyone.  This morning 05/28/13 received a fax with the appointment for the patient.  Appt scheduled for 05/28/13 at 2 pm.  Have phoned patient.  Unable to speak to her.  Left a voicemail message of appointment date/time.  Asked she call me back to let me know she received my message.

## 2013-05-28 ENCOUNTER — Telehealth: Payer: Self-pay | Admitting: *Deleted

## 2013-05-28 ENCOUNTER — Telehealth: Payer: Self-pay | Admitting: General Practice

## 2013-05-28 NOTE — Telephone Encounter (Signed)
Patient called and left another message requesting test results and asking what they mean for her and her pregnancy and the baby. Called patient back and informed her of recent test results as requested. Also told patient that I am working with her insurance company now to get the insulin authorized and we are hoping to hear something back from them soon and were trying to expedite the process as quickly as possible since the insulin is an urgent need for her. Patient verbalized understanding to all and had no further questions

## 2013-05-28 NOTE — Telephone Encounter (Signed)
Patient phoned to make me aware she had received my message regarding appointment with Dr. Chalmers Cater at 2 pm today.  States Dr. Almetta Lovely office had phoned her yesterday with appointment.  Patient states she has a couple of questions.  States we changed her insulin regime yesterday to include NPH.  States she was told by her pharmacy that her insurance requires a pre-authorization.  I explained the pharmacy faxed [place] some paperwork to complete for the pre-authorization and we are working to try to obtain approval for that today.  Patient states she took her lantus last night so she would have coverage.  Asks if she should continue with her previous regime until the NPH is approved.  Encouraged patient to do so.  Patient also requests results from 24 hour urine test.    Spoke with Dr. Elly Modena who reviewed results.  Telephoned patient to review results with her.  Left message for patient to return my call.  Patient returned by call.  Explained I had spoken with Dr. Elly Modena who had reviewed her results.  Explained that she did have some kidney involvement though her kidneys were still functioning very well.  Patient states understanding.  Reminded again of appointment for next week at 9:15 am.  Patient states understanding and agrees to call back if she has additional questions or concerns in the meantime.

## 2013-05-28 NOTE — Telephone Encounter (Signed)
Opened in error

## 2013-05-28 NOTE — Progress Notes (Signed)
Patient returned by call.  Stated Dr. Almetta Lovely office had notified her of appointment yesterday 05/27/13.

## 2013-05-29 ENCOUNTER — Telehealth: Payer: Self-pay | Admitting: Obstetrics and Gynecology

## 2013-05-29 ENCOUNTER — Telehealth: Payer: Self-pay | Admitting: *Deleted

## 2013-05-29 ENCOUNTER — Encounter: Payer: Self-pay | Admitting: *Deleted

## 2013-05-29 NOTE — Telephone Encounter (Signed)
Late Entry -  Spoke to patient via telephone yesterday 05/28/13 to let her know we were still trying to obtain pre-authorization for her NPH insulin.  Patient states she has seen Dr. Debbora Presto and that she intends to contact our office.  Patient states Dr. Debbora Presto wants her to continue Lantus instead of going to NPH.  Told patient I would make her providers aware.  Spoke with Dr. Gala Romney via phone to make her aware of above.  Dr. Gala Romney requests I get in touch with Rosita Kea and ask her to contact Dr. Debbora Presto about management of patient.  Intend of referral to Dr. Debbora Presto was to discuss possibility of insulin pump.    Was able to speak with Rosita Kea this morning 05/29/13.  Lelon Frohlich states she will follow-up with Dr. Debbora Presto and will let us know the outcome of the discussion.

## 2013-05-29 NOTE — Telephone Encounter (Signed)
Patient called requesting advice on which medicine to take for cold symptoms. Returned call and left message on VM that we are returning her call and that we'll try to contact her again.

## 2013-05-30 ENCOUNTER — Encounter: Payer: Self-pay | Admitting: *Deleted

## 2013-05-30 NOTE — Telephone Encounter (Signed)
Called patient stating I was returning her phone call from yesterday and reviewed approved medications with her for cold symptoms & which ones to avoid. Patient verbalized understanding and stated that she saw Dr Bubba Camp yesterday and was told that we could take over during her pregnancy in regards to controlling her blood sugars and whatever we decided was fine for treatment of her blood sugars. Told patient to make sure she reviews this with the provider she sees on Monday 10/6 and to continue with what Dr Bubba Camp had her on till then. Patient verbalized understanding to all and had no further questions

## 2013-05-30 NOTE — Progress Notes (Signed)
Received a call from Rosita Kea, RN.  She was able to speak with Dr. Almetta Lovely nurse.  Dr. Chalmers Cater doesn't want to manage patient through pregnancy.  Will follow-up with patient after pregnancy.  Ann and Ulyses Jarred, RN will follow-up on the patient's pump and supplies for pump to determine when this might be approved.  I explained that we did receive approval for NPH insulin.  I have emailed the [employer] representative, Melinda Crutch and asked her to be in clinic for this patient's appointment on Monday.

## 2013-06-03 ENCOUNTER — Encounter: Payer: Self-pay | Admitting: Obstetrics and Gynecology

## 2013-06-03 ENCOUNTER — Encounter: Payer: PRIVATE HEALTH INSURANCE | Attending: Obstetrics & Gynecology | Admitting: *Deleted

## 2013-06-03 ENCOUNTER — Ambulatory Visit (INDEPENDENT_AMBULATORY_CARE_PROVIDER_SITE_OTHER): Payer: PRIVATE HEALTH INSURANCE | Admitting: Obstetrics and Gynecology

## 2013-06-03 VITALS — BP 129/86 | Temp 98.2°F | Wt 171.7 lb

## 2013-06-03 DIAGNOSIS — E119 Type 2 diabetes mellitus without complications: Secondary | ICD-10-CM | POA: Diagnosis not present

## 2013-06-03 DIAGNOSIS — E1021 Type 1 diabetes mellitus with diabetic nephropathy: Secondary | ICD-10-CM

## 2013-06-03 DIAGNOSIS — N058 Unspecified nephritic syndrome with other morphologic changes: Secondary | ICD-10-CM

## 2013-06-03 DIAGNOSIS — E1029 Type 1 diabetes mellitus with other diabetic kidney complication: Secondary | ICD-10-CM

## 2013-06-03 DIAGNOSIS — O24919 Unspecified diabetes mellitus in pregnancy, unspecified trimester: Secondary | ICD-10-CM

## 2013-06-03 DIAGNOSIS — O24312 Unspecified pre-existing diabetes mellitus in pregnancy, second trimester: Secondary | ICD-10-CM

## 2013-06-03 DIAGNOSIS — Z713 Dietary counseling and surveillance: Secondary | ICD-10-CM | POA: Insufficient documentation

## 2013-06-03 DIAGNOSIS — Z23 Encounter for immunization: Secondary | ICD-10-CM

## 2013-06-03 DIAGNOSIS — E109 Type 1 diabetes mellitus without complications: Secondary | ICD-10-CM

## 2013-06-03 LAB — POCT URINALYSIS DIP (DEVICE)
Bilirubin Urine: NEGATIVE
Ketones, ur: NEGATIVE mg/dL
Leukocytes, UA: NEGATIVE
Protein, ur: 30 mg/dL — AB
Urobilinogen, UA: 0.2 mg/dL (ref 0.0–1.0)
pH: 7 (ref 5.0–8.0)

## 2013-06-03 NOTE — Progress Notes (Signed)
Pulse- 84 Patient reports lower abdominal/pelvic pressure

## 2013-06-03 NOTE — Progress Notes (Signed)
DIABETES: [employer] pump has been approved through [employer]. Orders are complete. Pump to be delivered directly to patient home. Expect by end of the week. Will schedule for appointment next Monday and arrange for Melinda Crutch to initiate pump. Patient to email me as soon as pump arrives. She will also contact [employer] to confirm sensor and infusion set delivery. Patient has supply of Novolog at home. Modifications per Dr. Chalmers Cater: Lantus 28units @ hs Novolog before meals 1 unit for 5gm of carbs meal coverage 1 unit for every 25 pts above 125 (correction) 125-150 + 1unit 151-175 + 2 units 176- 200 + 3 units 201- 225 +4 units

## 2013-06-03 NOTE — Progress Notes (Signed)
Patient doing well. Was seen by Dr. Debbora Presto on 9/30 and he made adjustment to her Lantus. Patient has been approved for insulin pump. He didn't want to put her on NPH due to the fact that she would be started on pump shortly. CBGs vary widely from 86-336. Patient states that she feels she has been running low on this new regimen and attributes the high values to a URI. Patient to meet with diabetic educator regarding pump placement and further management.

## 2013-06-10 ENCOUNTER — Ambulatory Visit (INDEPENDENT_AMBULATORY_CARE_PROVIDER_SITE_OTHER): Payer: PRIVATE HEALTH INSURANCE | Admitting: Obstetrics & Gynecology

## 2013-06-10 VITALS — BP 131/92 | Temp 98.0°F | Wt 175.3 lb

## 2013-06-10 DIAGNOSIS — N058 Unspecified nephritic syndrome with other morphologic changes: Secondary | ICD-10-CM

## 2013-06-10 DIAGNOSIS — O24312 Unspecified pre-existing diabetes mellitus in pregnancy, second trimester: Secondary | ICD-10-CM

## 2013-06-10 DIAGNOSIS — E1021 Type 1 diabetes mellitus with diabetic nephropathy: Secondary | ICD-10-CM

## 2013-06-10 DIAGNOSIS — O24919 Unspecified diabetes mellitus in pregnancy, unspecified trimester: Secondary | ICD-10-CM

## 2013-06-10 DIAGNOSIS — E1029 Type 1 diabetes mellitus with other diabetic kidney complication: Secondary | ICD-10-CM

## 2013-06-10 LAB — POCT URINALYSIS DIP (DEVICE)
Bilirubin Urine: NEGATIVE
Leukocytes, UA: NEGATIVE
Nitrite: NEGATIVE
Protein, ur: 100 mg/dL — AB
Specific Gravity, Urine: 1.025 (ref 1.005–1.030)
Urobilinogen, UA: 1 mg/dL (ref 0.0–1.0)
pH: 8 (ref 5.0–8.0)

## 2013-06-10 MED ORDER — INSULIN ASPART 100 UNIT/ML ~~LOC~~ SOLN: SUBCUTANEOUS | Status: DC

## 2013-06-10 NOTE — Progress Notes (Signed)
P= 89 C/o of intermittent pressure/pain in left lower abdomen/pelvic area.

## 2013-06-10 NOTE — Progress Notes (Signed)
Insulin pump started today. Quad screen.

## 2013-06-10 NOTE — Patient Instructions (Signed)
Pregnancy - Second Trimester The second trimester is the period between 13 to 27 weeks of your pregnancy. It is important to follow your doctor's instructions. HOME CARE   Do not smoke.  Do not drink alcohol or use drugs.  Only take medicine as told by your doctor.  Take prenatal vitamins as told. The vitamin should contain 1 milligram of folic acid.  Exercise.  Eat healthy foods. Eat regular, well-balanced meals.  You can have sex (intercourse) if there are no other problems with the pregnancy.  Do not use hot tubs, steam rooms, or saunas.  Wear a seat belt while driving.  Avoid raw meat, uncooked cheese, and litter boxes and soil used by cats.  Visit your dentist. Adah Salvage are okay. GET HELP RIGHT AWAY IF:   You have a temperature by mouth above 102 F (38.9 C), not controlled by medicine.  Fluid is coming from your vagina.  Blood is coming from your vagina. Light spotting is common, especially after sex (intercourse).  You have a bad smelling fluid (discharge) coming from the vagina. The fluid changes from clear to white.  You still feel sick to your stomach (nauseous).  You throw up (vomit) blood.  You lose or gain more than 2 pounds (0.9 kilograms) of weight in a week, or as suggested by your doctor.  Your face, hands, feet, or legs get puffy (swell).  You get exposed to [place] measles and have never had them.  You get exposed to fifth disease or chickenpox.  You have belly (abdominal) pain.  You have a bad headache that will not go away.  You have watery poop (diarrhea), pain when you pee (urinate), or have shortness of breath.  You start to have problems seeing (blurry or double vision).  You fall, are in a car accident, or have any kind of trauma.  There is mental or physical violence at home.  You have any concerns or worries during your pregnancy. MAKE SURE YOU:   Understand these instructions.  Will watch your condition.  Will get help  right away if you are not doing well or get worse. Document Released: 11/09/2009 Document Revised: 11/07/2011 Document Reviewed: 11/09/2009 [facility] Patient Information 2014 [place], [place].

## 2013-06-11 LAB — AFP, QUAD SCREEN
AFP: 15.8 IU/mL
Curr Gest Age: 15.4 wks.days
HCG, Total: 31818 m[IU]/mL
MoM for AFP: 0.67
MoM for INH: 0.76
Open Spina bifida: NEGATIVE
Tri 18 Scr Risk Est: NEGATIVE
Trisomy 18 (Edward) Syndrome Interp.: 1:3140 {titer}
uE3 Value: 0.2 ng/mL

## 2013-06-14 ENCOUNTER — Encounter: Payer: Self-pay | Admitting: *Deleted

## 2013-06-14 DIAGNOSIS — E1021 Type 1 diabetes mellitus with diabetic nephropathy: Secondary | ICD-10-CM

## 2013-06-14 DIAGNOSIS — O24319 Unspecified pre-existing diabetes mellitus in pregnancy, unspecified trimester: Secondary | ICD-10-CM

## 2013-06-17 ENCOUNTER — Ambulatory Visit (INDEPENDENT_AMBULATORY_CARE_PROVIDER_SITE_OTHER): Payer: PRIVATE HEALTH INSURANCE | Admitting: Obstetrics and Gynecology

## 2013-06-17 DIAGNOSIS — O24912 Unspecified diabetes mellitus in pregnancy, second trimester: Secondary | ICD-10-CM

## 2013-06-17 DIAGNOSIS — O24919 Unspecified diabetes mellitus in pregnancy, unspecified trimester: Secondary | ICD-10-CM

## 2013-06-17 MED ORDER — BAYER CONTOUR NEXT EZ W/DEVICE KIT: 1.0000 | PACK | Freq: Every day | Status: AC

## 2013-06-17 MED ORDER — GLUCOSE BLOOD VI STRP: ORAL_STRIP | Status: DC

## 2013-06-17 NOTE — Progress Notes (Signed)
DIABETES:  Pump f/u with Melinda Crutch, RN, BSN, CDE. Only change was to weaken sensitivity from 1:35 to 1: 39 due to lows. CareLink upload scanned to chart by Claiborne Billings L.

## 2013-06-17 NOTE — Progress Notes (Signed)
Patient met with Melinda Crutch, [employer] Insulin Pump Representative.  Insulin Pump Update for today 06/17/13 scanned to patient chart under media tab.

## 2013-06-17 NOTE — Progress Notes (Signed)
Patient seen bt Becky hardy for insulin pump management

## 2013-06-17 NOTE — Progress Notes (Signed)
DIABETES:  Patient started on [employer] insulin pump per Flossie Dibble. Jacqlyn Larsen will continue to manage pump at [facility] until delivery.

## 2013-06-21 ENCOUNTER — Telehealth: Payer: Self-pay | Admitting: Obstetrics and Gynecology

## 2013-06-21 NOTE — Telephone Encounter (Signed)
Patient called in with small rectal bleed after a hard bowel movement. Advised patient to increase fluid intake and may take fibercon or citrucil (sugar free) to soften her bowels. Patient states understanding and satisfied. She also mentions that her test strips are low and pharmacy couldn't dispense until we get a PRE-AUTHORIZATION on it. Spoke to pharmacy on file, they will fax the authorization form to [place] to fill. Awaiting for this fax.

## 2013-06-24 ENCOUNTER — Ambulatory Visit (INDEPENDENT_AMBULATORY_CARE_PROVIDER_SITE_OTHER): Payer: PRIVATE HEALTH INSURANCE | Admitting: Obstetrics and Gynecology

## 2013-06-24 ENCOUNTER — Ambulatory Visit: Payer: PRIVATE HEALTH INSURANCE | Admitting: Obstetrics and Gynecology

## 2013-06-24 ENCOUNTER — Encounter: Payer: Self-pay | Admitting: Obstetrics and Gynecology

## 2013-06-24 VITALS — BP 120/84 | Temp 97.4°F | Wt 177.9 lb

## 2013-06-24 DIAGNOSIS — O24312 Unspecified pre-existing diabetes mellitus in pregnancy, second trimester: Secondary | ICD-10-CM

## 2013-06-24 DIAGNOSIS — N058 Unspecified nephritic syndrome with other morphologic changes: Secondary | ICD-10-CM

## 2013-06-24 DIAGNOSIS — E1029 Type 1 diabetes mellitus with other diabetic kidney complication: Secondary | ICD-10-CM

## 2013-06-24 DIAGNOSIS — O24919 Unspecified diabetes mellitus in pregnancy, unspecified trimester: Secondary | ICD-10-CM

## 2013-06-24 DIAGNOSIS — E1021 Type 1 diabetes mellitus with diabetic nephropathy: Secondary | ICD-10-CM

## 2013-06-24 LAB — POCT URINALYSIS DIP (DEVICE)
Bilirubin Urine: NEGATIVE
Glucose, UA: NEGATIVE mg/dL
Ketones, ur: NEGATIVE mg/dL
Leukocytes, UA: NEGATIVE
Specific Gravity, Urine: >=1.03 (ref 1.005–1.030)
pH: 5.5 (ref 5.0–8.0)

## 2013-06-24 NOTE — Progress Notes (Signed)
Ultrasound for anatomy scheduled for 7:30 am on 07/08/13.

## 2013-06-24 NOTE — Progress Notes (Signed)
Patient doing well without complaints. Has been suffering for URI symptoms. CBG fasting 60's to 100's. 2hr pp most within range but a few as high as 350. Patient to meet with Izora Gala today for pump adjustement. Will schedule anatomy ultrasound for week of Nov 3

## 2013-06-24 NOTE — Progress Notes (Signed)
DIABETES:  Pump f/u with Melinda Crutch, RN, BSN, CDE with [employer]. Weakened sensitivity from 1:39 to 1:41 due to lows. CareLink upload scanned to chart by Claiborne Billings L. Working on Biochemist, clinical for glucometer strips. Sample of #25 strips given per [facility]. Upload on [facility]Link scheduled for 06/26/13 for evaluation by [facility]. Scheduled for Sensor Start Class on 07/02/13. Pt scheduled to return to clinic in 2 weeks.

## 2013-06-24 NOTE — Progress Notes (Signed)
Pulse: 89

## 2013-06-25 ENCOUNTER — Telehealth: Payer: Self-pay | Admitting: *Deleted

## 2013-06-25 NOTE — Telephone Encounter (Signed)
Telephoned patient and left message.  Explained that patient may receive a call from [employer].  Explained I am working with them to get approval for the CBG testing supplies needed for her medtronic insulin pump.    Completed paperwork from [employer] and faxed back.  Copy scanned under media tab.

## 2013-06-26 NOTE — Telephone Encounter (Signed)
Yesterday 06/25/13 spoke with Delana Meyer at [employer] to try to get emergency pre-authorization for patient's testing supplies.  Jasmine stated that within in 24 hours we would receive a form that must be completed and faxed back to [employer] and then it could take up to 72 hours after they receive the fax before a determination is made.    Received a voicemail today from [place] with [employer].  Fredric Dine said she had gotten the pre-authorization form from [employer] and was faxing to me to complete and get MD signature.  Asked that I fax back to [employer] or to her and she would fax.  I received the form completed it and faxed to both [place].  Copies of the form and the fax confirmation have been scanned to the patient's chart under the media tab.  Will await further communication from Engineer, materials.

## 2013-06-28 ENCOUNTER — Encounter: Payer: Self-pay | Admitting: *Deleted

## 2013-06-28 ENCOUNTER — Telehealth: Payer: Self-pay | Admitting: *Deleted

## 2013-06-28 NOTE — Telephone Encounter (Signed)
Contacted [employer] for pre-authorization for testing supplies.  At approximately 3pm got approval.  Notified patient via phone to go to her pharmacy to pickup supplies.  Contacted [facility] to be sure pre-authorization had been received.  Pharmacist reports prescription is authorized.  Explained patient had been notified to come in to pickup.

## 2013-06-28 NOTE — Telephone Encounter (Signed)
See Tel Enc from [employer].

## 2013-07-08 ENCOUNTER — Other Ambulatory Visit: Payer: Self-pay | Admitting: Obstetrics and Gynecology

## 2013-07-08 ENCOUNTER — Ambulatory Visit (INDEPENDENT_AMBULATORY_CARE_PROVIDER_SITE_OTHER): Payer: PRIVATE HEALTH INSURANCE | Admitting: Family Medicine

## 2013-07-08 ENCOUNTER — Ambulatory Visit (HOSPITAL_COMMUNITY)
Admission: RE | Admit: 2013-07-08 | Discharge: 2013-07-08 | Disposition: A | Discharge status: Home / Self Care | Payer: PRIVATE HEALTH INSURANCE | Source: Ambulatory Visit | Attending: Obstetrics and Gynecology | Admitting: Obstetrics and Gynecology

## 2013-07-08 ENCOUNTER — Encounter: Payer: Medicaid Other | Attending: Obstetrics & Gynecology | Admitting: *Deleted

## 2013-07-08 VITALS — BP 136/92 | Wt 176.6 lb

## 2013-07-08 DIAGNOSIS — O24312 Unspecified pre-existing diabetes mellitus in pregnancy, second trimester: Secondary | ICD-10-CM

## 2013-07-08 DIAGNOSIS — E1021 Type 1 diabetes mellitus with diabetic nephropathy: Secondary | ICD-10-CM

## 2013-07-08 DIAGNOSIS — O24919 Unspecified diabetes mellitus in pregnancy, unspecified trimester: Secondary | ICD-10-CM | POA: Insufficient documentation

## 2013-07-08 DIAGNOSIS — O358XX0 Maternal care for other (suspected) fetal abnormality and damage, not applicable or unspecified: Secondary | ICD-10-CM | POA: Insufficient documentation

## 2013-07-08 DIAGNOSIS — Z1389 Encounter for screening for other disorder: Secondary | ICD-10-CM | POA: Insufficient documentation

## 2013-07-08 DIAGNOSIS — E1129 Type 2 diabetes mellitus with other diabetic kidney complication: Secondary | ICD-10-CM | POA: Insufficient documentation

## 2013-07-08 DIAGNOSIS — Z363 Encounter for antenatal screening for malformations: Secondary | ICD-10-CM | POA: Insufficient documentation

## 2013-07-08 DIAGNOSIS — N058 Unspecified nephritic syndrome with other morphologic changes: Secondary | ICD-10-CM

## 2013-07-08 DIAGNOSIS — E1029 Type 1 diabetes mellitus with other diabetic kidney complication: Secondary | ICD-10-CM

## 2013-07-08 DIAGNOSIS — E109 Type 1 diabetes mellitus without complications: Secondary | ICD-10-CM | POA: Insufficient documentation

## 2013-07-08 DIAGNOSIS — Z713 Dietary counseling and surveillance: Secondary | ICD-10-CM | POA: Insufficient documentation

## 2013-07-08 LAB — POCT URINALYSIS DIP (DEVICE)
Glucose, UA: NEGATIVE mg/dL
Ketones, ur: NEGATIVE mg/dL
Leukocytes, UA: NEGATIVE
Specific Gravity, Urine: 1.02 (ref 1.005–1.030)
Urobilinogen, UA: 0.2 mg/dL (ref 0.0–1.0)

## 2013-07-08 NOTE — Progress Notes (Signed)
Fetal Echo scheduled 07/29/13 at 9 am with Dr. Filbert Schilder.

## 2013-07-08 NOTE — Progress Notes (Signed)
DIABETES: Patient see by Levada Schilling, RN, BSN, CDE ([employer](. Doing very well. Avg BG last 7 days 129-/-69. Total daily dose average 37units . Using Sensor. Alerts set today. She inserted new sensor. Reminded to bolus ALL carbs. To upload pump 06-09-13. Documents to be uploaded.

## 2013-07-08 NOTE — Progress Notes (Signed)
Pulse: 88

## 2013-07-08 NOTE — Progress Notes (Signed)
Discussed pumps--nephropathy--need for tight control so this doesn't progress. BS doing well--usually around 120 To see opthalmology in January Anatomy u/s today WNL except didn't get good views of heart Needs fetal ECHO--will schedule

## 2013-07-08 NOTE — Patient Instructions (Addendum)
Second Trimester of Pregnancy The second trimester is from week 13 through week 28, months 4 through 6. The second trimester is often a time when you feel your best. Your body has also adjusted to being pregnant, and you begin to feel better physically. Usually, morning sickness has lessened or quit completely, you may have more energy, and you may have an increase in appetite. The second trimester is also a time when the fetus is growing rapidly. At the end of the sixth month, the fetus is about 9 inches long and weighs about 1 pounds. You will likely begin to feel the baby move (quickening) between 18 and 20 weeks of the pregnancy. BODY CHANGES Your body goes through many changes during pregnancy. The changes vary from woman to woman.   Your weight will continue to increase. You will notice your lower abdomen bulging out.  You may begin to get stretch marks on your hips, abdomen, and breasts.  You may develop headaches that can be relieved by medicines approved by your caregiver.  You may urinate more often because the fetus is pressing on your bladder.  You may develop or continue to have heartburn as a result of your pregnancy.  You may develop constipation because certain hormones are causing the muscles that push waste through your intestines to slow down.  You may develop hemorrhoids or swollen, bulging veins (varicose veins).  You may have back pain because of the weight gain and pregnancy hormones relaxing your joints between the bones in your pelvis and as a result of a shift in weight and the muscles that support your balance.  Your breasts will continue to grow and be tender.  Your gums may bleed and may be sensitive to brushing and flossing.  Dark spots or blotches (chloasma, mask of pregnancy) may develop on your face. This will likely fade after the baby is born.  A dark line from your belly button to the pubic area (linea nigra) may appear. This will likely fade after  the baby is born. WHAT TO EXPECT AT YOUR PRENATAL VISITS During a routine prenatal visit:  You will be weighed to make sure you and the fetus are growing normally.  Your blood pressure will be taken.  Your abdomen will be measured to track your baby's growth.  The fetal heartbeat will be listened to.  Any test results from the previous visit will be discussed. Your caregiver may ask you:  How you are feeling.  If you are feeling the baby move.  If you have had any abnormal symptoms, such as leaking fluid, bleeding, severe headaches, or abdominal cramping.  If you have any questions. Other tests that may be performed during your second trimester include:  Blood tests that check for:  Low iron levels (anemia).  Gestational diabetes (between 24 and 28 weeks).  Rh antibodies.  Urine tests to check for infections, diabetes, or protein in the urine.  An ultrasound to confirm the proper growth and development of the baby.  An amniocentesis to check for possible genetic problems.  Fetal screens for spina bifida and Down syndrome. HOME CARE INSTRUCTIONS   Avoid all smoking, herbs, alcohol, and unprescribed drugs. These chemicals affect the formation and growth of the baby.  Follow your caregiver's instructions regarding medicine use. There are medicines that are either safe or unsafe to take during pregnancy.  Exercise only as directed by your caregiver. Experiencing uterine cramps is a good sign to stop exercising.  Continue to eat regular,  healthy meals.  Wear a good support bra for breast tenderness.  Do not use hot tubs, steam rooms, or saunas.  Wear your seat belt at all times when driving.  Avoid raw meat, uncooked cheese, cat litter boxes, and soil used by cats. These carry germs that can cause birth defects in the baby.  Take your prenatal vitamins.  Try taking a stool softener (if your caregiver approves) if you develop constipation. Eat more high-fiber  foods, such as fresh vegetables or fruit and whole grains. Drink plenty of fluids to keep your urine clear or pale yellow.  Take warm sitz baths to soothe any pain or discomfort caused by hemorrhoids. Use hemorrhoid cream if your caregiver approves.  If you develop varicose veins, wear support hose. Elevate your feet for 15 minutes, 3 4 times a day. Limit salt in your diet.  Avoid heavy lifting, wear low heel shoes, and practice good posture.  Rest with your legs elevated if you have leg cramps or low back pain.  Visit your dentist if you have not gone yet during your pregnancy. Use a soft toothbrush to brush your teeth and be gentle when you floss.  A sexual relationship may be continued unless your caregiver directs you otherwise.  Continue to go to all your prenatal visits as directed by your caregiver. SEEK MEDICAL CARE IF:   You have dizziness.  You have mild pelvic cramps, pelvic pressure, or nagging pain in the abdominal area.  You have persistent nausea, vomiting, or diarrhea.  You have a bad smelling vaginal discharge.  You have pain with urination. SEEK IMMEDIATE MEDICAL CARE IF:   You have a fever.  You are leaking fluid from your vagina.  You have spotting or bleeding from your vagina.  You have severe abdominal cramping or pain.  You have rapid weight gain or loss.  You have shortness of breath with chest pain.  You notice sudden or extreme swelling of your face, hands, ankles, feet, or legs.  You have not felt your baby move in over an hour.  You have severe headaches that do not go away with medicine.  You have vision changes. Document Released: 08/09/2001 Document Revised: 04/17/2013 Document Reviewed: 10/16/2012 [facility] Patient Information 2014 [place].  Breastfeeding Deciding to breastfeed is one of the best choices you can make for you and your baby. A change in hormones during pregnancy causes your breast tissue to grow and increases the  number and size of your milk ducts. These hormones also allow proteins, sugars, and fats from your blood supply to make breast milk in your milk-producing glands. Hormones prevent breast milk from being released before your baby is born as well as prompt milk flow after birth. Once breastfeeding has begun, thoughts of your baby, as well as his or her sucking or crying, can stimulate the release of milk from your milk-producing glands.  BENEFITS OF BREASTFEEDING For Your Baby  Your first milk (colostrum) helps your baby's digestive system function better.   There are antibodies in your milk that help your baby fight off infections.   Your baby has a lower incidence of asthma, allergies, and sudden infant death syndrome.   The nutrients in breast milk are better for your baby than infant formulas and are designed uniquely for your baby's needs.   Breast milk improves your baby's brain development.   Your baby is less likely to develop other conditions, such as childhood obesity, asthma, or type 2 diabetes mellitus.  For  You   Breastfeeding helps to create a very special bond between you and your baby.   Breastfeeding is convenient. Breast milk is always available at the correct temperature and costs nothing.   Breastfeeding helps to burn calories and helps you lose the weight gained during pregnancy.   Breastfeeding makes your uterus contract to its prepregnancy size faster and slows bleeding (lochia) after you give birth.   Breastfeeding helps to lower your risk of developing type 2 diabetes mellitus, osteoporosis, and breast or ovarian cancer later in life. SIGNS THAT YOUR BABY IS HUNGRY Early Signs of Hunger  Increased alertness or activity.  Stretching.  Movement of the head from side to side.  Movement of the head and opening of the mouth when the corner of the mouth or cheek is stroked (rooting).  Increased sucking sounds, smacking lips, cooing, sighing, or  squeaking.  Hand-to-mouth movements.  Increased sucking of fingers or hands. Late Signs of Hunger  Fussing.  Intermittent crying. Extreme Signs of Hunger Signs of extreme hunger will require calming and consoling before your baby will be able to breastfeed successfully. Do not wait for the following signs of extreme hunger to occur before you initiate breastfeeding:   Restlessness.  A loud, strong cry.   Screaming. BREASTFEEDING BASICS Breastfeeding Initiation  Find a comfortable place to sit or lie down, with your neck and back well supported.  Place a pillow or rolled up blanket under your baby to bring him or her to the level of your breast (if you are seated). Nursing pillows are specially designed to help support your arms and your baby while you breastfeed.  Make sure that your baby's abdomen is facing your abdomen.   Gently massage your breast. With your fingertips, massage from your chest wall toward your nipple in a circular motion. This encourages milk flow. You may need to continue this action during the feeding if your milk flows slowly.  Support your breast with 4 fingers underneath and your thumb above your nipple. Make sure your fingers are well away from your nipple and your baby's mouth.   Stroke your baby's lips gently with your finger or nipple.   When your baby's mouth is open wide enough, quickly bring your baby to your breast, placing your entire nipple and as much of the colored area around your nipple (areola) as possible into your baby's mouth.   More areola should be visible above your baby's upper lip than below the lower lip.   Your baby's tongue should be between his or her lower gum and your breast.   Ensure that your baby's mouth is correctly positioned around your nipple (latched). Your baby's lips should create a seal on your breast and be turned out (everted).  It is common for your baby to suck about 2 3 minutes in order to start the  flow of breast milk. Latching Teaching your baby how to latch on to your breast properly is very important. An improper latch can cause nipple pain and decreased milk supply for you and poor weight gain in your baby. Also, if your baby is not latched onto your nipple properly, he or she may swallow some air during feeding. This can make your baby fussy. Burping your baby when you switch breasts during the feeding can help to get rid of the air. However, teaching your baby to latch on properly is still the best way to prevent fussiness from swallowing air while breastfeeding. Signs that your baby has  successfully latched on to your nipple:    Silent tugging or silent sucking, without causing you pain.   Swallowing heard between every 3 4 sucks.    Muscle movement above and in front of his or her ears while sucking.  Signs that your baby has not successfully latched on to nipple:   Sucking sounds or smacking sounds from your baby while breastfeeding.  Nipple pain. If you think your baby has not latched on correctly, slip your finger into the corner of your baby's mouth to break the suction and place it between your baby's gums. Attempt breastfeeding initiation again. Signs of Successful Breastfeeding Signs from your baby:   A gradual decrease in the number of sucks or complete cessation of sucking.   Falling asleep.   Relaxation of his or her body.   Retention of a small amount of milk in his or her mouth.   Letting go of your breast by himself or herself. Signs from you:  Breasts that have increased in firmness, weight, and size 1 3 hours after feeding.   Breasts that are softer immediately after breastfeeding.  Increased milk volume, as well as a change in milk consistency and color by the 5th day of breastfeeding.   Nipples that are not sore, cracked, or bleeding. Signs That Your [employer] is Getting Enough Milk  Wetting at least 3 diapers in a 24-hour period. The urine  should be clear and pale yellow by age 64 days.  At least 3 stools in a 24-hour period by age 64 days. The stool should be soft and yellow.  At least 3 stools in a 24-hour period by age 616 days. The stool should be seedy and yellow.  No loss of weight greater than 10% of birth weight during the first 91 days of age.  Average weight gain of 4 7 ounces (120 210 mL) per week after age 61 days.  Consistent daily weight gain by age 65 days, without weight loss after the age of 2 weeks. After a feeding, your baby may spit up a small amount. This is common. BREASTFEEDING FREQUENCY AND DURATION Frequent feeding will help you make more milk and can prevent sore nipples and breast engorgement. Breastfeed when you feel the need to reduce the fullness of your breasts or when your baby shows signs of hunger. This is called "breastfeeding on demand." Avoid introducing a pacifier to your baby while you are working to establish breastfeeding (the first 4 6 weeks after your baby is born). After this time you may choose to use a pacifier. Research has shown that pacifier use during the first year of a baby's life decreases the risk of sudden infant death syndrome (SIDS). Allow your baby to feed on each breast as long as he or she wants. Breastfeed until your baby is finished feeding. When your baby unlatches or falls asleep while feeding from the first breast, offer the second breast. Because newborns are often sleepy in the first few weeks of life, you may need to awaken your baby to get him or her to feed. Breastfeeding times will vary from baby to baby. However, the following rules can serve as a guide to help you ensure that your baby is properly fed:  Newborns (babies 53 weeks of age or younger) may breastfeed every 1 3 hours.  Newborns should not go longer than 3 hours during the day or 5 hours during the night without breastfeeding.  You should breastfeed your baby a minimum of  8 times in a 24-hour period until  you begin to introduce solid foods to your baby at around 55 months of age. BREAST MILK PUMPING Pumping and storing breast milk allows you to ensure that your baby is exclusively fed your breast milk, even at times when you are unable to breastfeed. This is especially important if you are going back to work while you are still breastfeeding or when you are not able to be present during feedings. Your lactation consultant can give you guidelines on how long it is safe to store breast milk.  A breast pump is a machine that allows you to pump milk from your breast into a sterile bottle. The pumped breast milk can then be stored in a refrigerator or freezer. Some breast pumps are operated by hand, while others use electricity. Ask your lactation consultant which type will work best for you. Breast pumps can be purchased, but some hospitals and breastfeeding support groups lease breast pumps on a monthly basis. A lactation consultant can teach you how to hand express breast milk, if you prefer not to use a pump.  CARING FOR YOUR BREASTS WHILE YOU BREASTFEED Nipples can become dry, cracked, and sore while breastfeeding. The following recommendations can help keep your breasts moisturized and healthy:  Avoid using soap on your nipples.   Wear a supportive bra. Although not required, special nursing bras and tank tops are designed to allow access to your breasts for breastfeeding without taking off your entire bra or top. Avoid wearing underwire style bras or extremely tight bras.  Air dry your nipples for 3 4minutes after each feeding.   Use only cotton bra pads to absorb leaked breast milk. Leaking of breast milk between feedings is normal.   Use lanolin on your nipples after breastfeeding. Lanolin helps to maintain your skin's normal moisture barrier. If you use pure lanolin you do not need to wash it off before feeding your baby again. Pure lanolin is not toxic to your baby. You may also hand express a  few drops of breast milk and gently massage that milk into your nipples and allow the milk to air dry. In the first few weeks after giving birth, some women experience extremely full breasts (engorgement). Engorgement can make your breasts feel heavy, warm, and tender to the touch. Engorgement peaks within 3 5 days after you give birth. The following recommendations can help ease engorgement:  Completely empty your breasts while breastfeeding or pumping. You may want to start by applying warm, moist heat (in the shower or with warm water-soaked hand towels) just before feeding or pumping. This increases circulation and helps the milk flow. If your baby does not completely empty your breasts while breastfeeding, pump any extra milk after he or she is finished.  Wear a snug bra (nursing or regular) or tank top for 1 2 days to signal your body to slightly decrease milk production.  Apply ice packs to your breasts, unless this is too uncomfortable for you.  Make sure that your baby is latched on and positioned properly while breastfeeding. If engorgement persists after 48 hours of following these recommendations, contact your health care provider or a Science writer. OVERALL HEALTH CARE RECOMMENDATIONS WHILE BREASTFEEDING  Eat healthy foods. Alternate between meals and snacks, eating 3 of each per day. Because what you eat affects your breast milk, some of the foods may make your baby more irritable than usual. Avoid eating these foods if you are sure that they are  negatively affecting your baby.  Drink milk, fruit juice, and water to satisfy your thirst (about 10 glasses a day).   Rest often, relax, and continue to take your prenatal vitamins to prevent fatigue, stress, and anemia.  Continue breast self-awareness checks.  Avoid chewing and smoking tobacco.  Avoid alcohol and drug use. Some medicines that may be harmful to your baby can pass through breast milk. It is important to ask your  health care provider before taking any medicine, including all over-the-counter and prescription medicine as well as vitamin and herbal supplements. It is possible to become pregnant while breastfeeding. If birth control is desired, ask your health care provider about options that will be safe for your baby. SEEK MEDICAL CARE IF:   You feel like you want to stop breastfeeding or have become frustrated with breastfeeding.  You have painful breasts or nipples.  Your nipples are cracked or bleeding.  Your breasts are red, tender, or warm.  You have a swollen area on either breast.  You have a fever or chills.  You have nausea or vomiting.  You have drainage other than breast milk from your nipples.  Your breasts do not become full before feedings by the 5th day after you give birth.  You feel sad and depressed.  Your baby is too sleepy to eat well.  Your baby is having trouble sleeping.   Your baby is wetting less than 3 diapers in a 24-hour period.  Your baby has less than 3 stools in a 24-hour period.  Your baby's skin or the white part of his or her eyes becomes yellow.   Your baby is not gaining weight by 75 days of age. SEEK IMMEDIATE MEDICAL CARE IF:   Your baby is overly tired (lethargic) and does not want to wake up and feed.  Your baby develops an unexplained fever. Document Released: 08/15/2005 Document Revised: 04/17/2013 Document Reviewed: 02/06/2013 [facility] Patient Information 2014 [place]. Diabetic Nephropathy Diabetic nephropathy is a complication of diabetes that leads to damaged kidneys. It develops slowly. The function of healthy kidneys is to filter and clean blood. Kidneys also get rid of body waste products and extra fluid. When the kidney filters are damaged, there is protein loss in the urine, a decline in kidney function, a buildup of kidney waste products and fluid, and high blood pressure. The damage progresses until the kidneys fail.  RISK  FACTORS  High blood pressure (hypertension).  High blood sugar (hyperglycemia).  Family history.  Aging.  Obstruction problems affecting the kidneys, the tubes that drain the kidneys (ureters), or the bladder.  Taking certain drugs or medicines. SYMPTOMS  Symptoms may not be seen or felt for many years. You may not notice any signs of kidney failure until your kidneys have lost much of their ability to function. An early sign of damage is when small amounts of protein (albumin) leak into the urine. However, this can only be found through a urine test. Without physical symptoms, a urine test is often not performed. When the kidneys fail, you may feel one or more of the following:  Swelling of the hands and feet from the extra fluid in your body.  Constant upset stomach.  Constant fatigue. DIAGNOSIS When someone has diabetes, screening tests are done to look for any early signs of problems before symptoms develop and before damage has already been done. These tests may include:   Annual urine tests to screen for trace amounts of protein in the urine (  microalbuminuria).  Urine collectionover 24 hours to measure kidney function.  Blood tests that measure kidney function. Your caregiver is aware that problems other than diabetes can damage kidneys. If screening tests show early kidney damage, but it is thought that a different problem is causing the damage, other tests may be performed. Examples of these tests include:  An ultrasound of your kidney.  Taking a tissue sample (biopsy) from the kidney. TREATMENT The goal of treatment is to prevent or slow down damage to your kidneys. Controlling hypertension and hyperglycemia is critical. Your goal is to maintain a blood pressure below 120/80. If you have certain other medical problems, this goal may be different. Talk to your caregiver to make sure that your blood pressure goal is right for your needs. Regular testing of your blood  glucose at home is important. Your goal is to have a normal blood glucose (110 or less when fasting) as often as possible.  In addition, maintaining your hemoglobin A1c level at less than 7% reduces your risk for complications, including kidney damage. Common treatments include:  Dieting by controlling what you eat as well as the portion sizes.  Exercising to control blood pressure and blood glucose.  Taking medicines.  Giving yourself insulin injections if your caregiver feels that it is necessary.  Getting early treatment for urinary tract infections.  Regularly following up with your caregiver. If your disease progresses to end-stage kidney failure, you will need dialysis or a transplant. Dialysis can be done in 1 of 2 ways:  Hemodialysis. Your blood flows from a tube in your arm through a machine. The machine filters waste and extra fluid. The clean blood flows back into your arm.  Peritoneal dialysis. Your abdomen is filled with a special fluid. The fluid collects waste products and extra fluid from your blood. The fluid is then drained from your abdomen and discarded. SEEK MEDICAL CARE IF:   You are having problems keeping your blood glucose in the goal range.  You have swelling of the hands or feet.  You have weakness.  You have muscles spasms.  You have a constant upset stomach.  You feel tired all the time and this is not normal for you. SEEK IMMEDIATE MEDICAL CARE IF:  You have unusual dizziness or weakness.  You have excessive sleepiness.  You have a seizure or convulsion.  You have severe, painful muscle spasms.  You have shortness of breath or trouble breathing.  You pass out or have a fainting episode.  You have chest pains. MAKE SURE YOU:  Understand these instructions.  Will watch your condition.  Will get help right away if you are not doing well or get worse. Document Released: 09/04/2007 Document Revised: 04/17/2013 Document Reviewed:  04/06/2011 [facility] Patient Information 2014 [place], [place].

## 2013-07-12 ENCOUNTER — Encounter: Payer: Self-pay | Admitting: *Deleted

## 2013-07-17 ENCOUNTER — Encounter: Payer: Self-pay | Admitting: *Deleted

## 2013-07-22 ENCOUNTER — Ambulatory Visit (INDEPENDENT_AMBULATORY_CARE_PROVIDER_SITE_OTHER): Payer: PRIVATE HEALTH INSURANCE | Admitting: Obstetrics & Gynecology

## 2013-07-22 VITALS — BP 129/87 | Wt 182.9 lb

## 2013-07-22 DIAGNOSIS — E1029 Type 1 diabetes mellitus with other diabetic kidney complication: Secondary | ICD-10-CM

## 2013-07-22 DIAGNOSIS — N058 Unspecified nephritic syndrome with other morphologic changes: Secondary | ICD-10-CM

## 2013-07-22 DIAGNOSIS — O24919 Unspecified diabetes mellitus in pregnancy, unspecified trimester: Secondary | ICD-10-CM

## 2013-07-22 DIAGNOSIS — O24312 Unspecified pre-existing diabetes mellitus in pregnancy, second trimester: Secondary | ICD-10-CM

## 2013-07-22 DIAGNOSIS — E1021 Type 1 diabetes mellitus with diabetic nephropathy: Secondary | ICD-10-CM

## 2013-07-22 LAB — POCT URINALYSIS DIP (DEVICE)
Bilirubin Urine: NEGATIVE
Glucose, UA: NEGATIVE mg/dL
Ketones, ur: NEGATIVE mg/dL
Protein, ur: 30 mg/dL — AB
Specific Gravity, Urine: 1.025 (ref 1.005–1.030)

## 2013-07-22 NOTE — Progress Notes (Signed)
U/S scheduled 08/26/13 at 830 am with MFM.

## 2013-07-22 NOTE — Progress Notes (Signed)
Pulse: 89

## 2013-07-22 NOTE — Progress Notes (Signed)
Insulin pump managed by Melinda Crutch, CDE, RN; evaluated weekly Fetal ECHO on 07/29/13 OB follow up ultrasound with MFM ordered No other complaints or concerns.  Routine obstetric precautions reviewed.

## 2013-07-22 NOTE — Patient Instructions (Signed)
Return to clinic for any obstetric concerns or go to [facility] for evaluation  

## 2013-08-05 ENCOUNTER — Encounter: Payer: Self-pay | Admitting: Obstetrics and Gynecology

## 2013-08-05 ENCOUNTER — Encounter: Payer: Medicaid Other | Attending: Obstetrics & Gynecology | Admitting: *Deleted

## 2013-08-05 ENCOUNTER — Ambulatory Visit (INDEPENDENT_AMBULATORY_CARE_PROVIDER_SITE_OTHER): Payer: Medicaid Other | Admitting: Obstetrics and Gynecology

## 2013-08-05 VITALS — BP 123/85 | Temp 98.1°F | Wt 184.1 lb

## 2013-08-05 DIAGNOSIS — E109 Type 1 diabetes mellitus without complications: Secondary | ICD-10-CM | POA: Insufficient documentation

## 2013-08-05 DIAGNOSIS — O24312 Unspecified pre-existing diabetes mellitus in pregnancy, second trimester: Secondary | ICD-10-CM

## 2013-08-05 DIAGNOSIS — O24919 Unspecified diabetes mellitus in pregnancy, unspecified trimester: Secondary | ICD-10-CM | POA: Insufficient documentation

## 2013-08-05 LAB — POCT URINALYSIS DIP (DEVICE)
Bilirubin Urine: NEGATIVE
Glucose, UA: NEGATIVE mg/dL
Ketones, ur: NEGATIVE mg/dL
Protein, ur: NEGATIVE mg/dL
Specific Gravity, Urine: 1.025 (ref 1.005–1.030)
Urobilinogen, UA: 0.2 mg/dL (ref 0.0–1.0)

## 2013-08-05 NOTE — Patient Instructions (Signed)
Second Trimester of Pregnancy The second trimester is from week 13 through week 28, months 4 through 6. The second trimester is often a time when you feel your best. Your body has also adjusted to being pregnant, and you begin to feel better physically. Usually, morning sickness has lessened or quit completely, you may have more energy, and you may have an increase in appetite. The second trimester is also a time when the fetus is growing rapidly. At the end of the sixth month, the fetus is about 9 inches long and weighs about 1 pounds. You will likely begin to feel the baby move (quickening) between 18 and 20 weeks of the pregnancy. BODY CHANGES Your body goes through many changes during pregnancy. The changes vary from woman to woman.   Your weight will continue to increase. You will notice your lower abdomen bulging out.  You may begin to get stretch marks on your hips, abdomen, and breasts.  You may develop headaches that can be relieved by medicines approved by your caregiver.  You may urinate more often because the fetus is pressing on your bladder.  You may develop or continue to have heartburn as a result of your pregnancy.  You may develop constipation because certain hormones are causing the muscles that push waste through your intestines to slow down.  You may develop hemorrhoids or swollen, bulging veins (varicose veins).  You may have back pain because of the weight gain and pregnancy hormones relaxing your joints between the bones in your pelvis and as a result of a shift in weight and the muscles that support your balance.  Your breasts will continue to grow and be tender.  Your gums may bleed and may be sensitive to brushing and flossing.  Dark spots or blotches (chloasma, mask of pregnancy) may develop on your face. This will likely fade after the baby is born.  A dark line from your belly button to the pubic area (linea nigra) may appear. This will likely fade after the  baby is born. WHAT TO EXPECT AT YOUR PRENATAL VISITS During a routine prenatal visit:  You will be weighed to make sure you and the fetus are growing normally.  Your blood pressure will be taken.  Your abdomen will be measured to track your baby's growth.  The fetal heartbeat will be listened to.  Any test results from the previous visit will be discussed. Your caregiver may ask you:  How you are feeling.  If you are feeling the baby move.  If you have had any abnormal symptoms, such as leaking fluid, bleeding, severe headaches, or abdominal cramping.  If you have any questions. Other tests that may be performed during your second trimester include:  Blood tests that check for:  Low iron levels (anemia).  Gestational diabetes (between 24 and 28 weeks).  Rh antibodies.  Urine tests to check for infections, diabetes, or protein in the urine.  An ultrasound to confirm the proper growth and development of the baby.  An amniocentesis to check for possible genetic problems.  Fetal screens for spina bifida and Down syndrome. HOME CARE INSTRUCTIONS   Avoid all smoking, herbs, alcohol, and unprescribed drugs. These chemicals affect the formation and growth of the baby.  Follow your caregiver's instructions regarding medicine use. There are medicines that are either safe or unsafe to take during pregnancy.  Exercise only as directed by your caregiver. Experiencing uterine cramps is a good sign to stop exercising.  Continue to eat regular,   healthy meals.  Wear a good support bra for breast tenderness.  Do not use hot tubs, steam rooms, or saunas.  Wear your seat belt at all times when driving.  Avoid raw meat, uncooked cheese, cat litter boxes, and soil used by cats. These carry germs that can cause birth defects in the baby.  Take your prenatal vitamins.  Try taking a stool softener (if your caregiver approves) if you develop constipation. Eat more high-fiber foods,  such as fresh vegetables or fruit and whole grains. Drink plenty of fluids to keep your urine clear or pale yellow.  Take warm sitz baths to soothe any pain or discomfort caused by hemorrhoids. Use hemorrhoid cream if your caregiver approves.  If you develop varicose veins, wear support hose. Elevate your feet for 15 minutes, 3 4 times a day. Limit salt in your diet.  Avoid heavy lifting, wear low heel shoes, and practice good posture.  Rest with your legs elevated if you have leg cramps or low back pain.  Visit your dentist if you have not gone yet during your pregnancy. Use a soft toothbrush to brush your teeth and be gentle when you floss.  A sexual relationship may be continued unless your caregiver directs you otherwise.  Continue to go to all your prenatal visits as directed by your caregiver. SEEK MEDICAL CARE IF:   You have dizziness.  You have mild pelvic cramps, pelvic pressure, or nagging pain in the abdominal area.  You have persistent nausea, vomiting, or diarrhea.  You have a bad smelling vaginal discharge.  You have pain with urination. SEEK IMMEDIATE MEDICAL CARE IF:   You have a fever.  You are leaking fluid from your vagina.  You have spotting or bleeding from your vagina.  You have severe abdominal cramping or pain.  You have rapid weight gain or loss.  You have shortness of breath with chest pain.  You notice sudden or extreme swelling of your face, hands, ankles, feet, or legs.  You have not felt your baby move in over an hour.  You have severe headaches that do not go away with medicine.  You have vision changes. Document Released: 08/09/2001 Document Revised: 04/17/2013 Document Reviewed: 10/16/2012 ExitCare Patient Information 2014 [employer].  

## 2013-08-05 NOTE — Progress Notes (Signed)
P= 86

## 2013-08-05 NOTE — Progress Notes (Signed)
Trace heme on dipsticks: C&S sent. Fetal echo normal. Per endocrine, CBGs improving well, still adjusting. Reviewed note with Dr. Kennon Rounds. No complaints. Good FM. 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

## 2013-08-09 LAB — CULTURE, OB URINE: Colony Count: >=100000

## 2013-08-19 ENCOUNTER — Ambulatory Visit (INDEPENDENT_AMBULATORY_CARE_PROVIDER_SITE_OTHER): Payer: Medicaid Other | Admitting: Obstetrics & Gynecology

## 2013-08-19 ENCOUNTER — Encounter: Payer: Medicaid Other | Admitting: *Deleted

## 2013-08-19 VITALS — BP 127/83 | Temp 97.2°F | Wt 185.2 lb

## 2013-08-19 DIAGNOSIS — E1021 Type 1 diabetes mellitus with diabetic nephropathy: Secondary | ICD-10-CM

## 2013-08-19 DIAGNOSIS — N058 Unspecified nephritic syndrome with other morphologic changes: Secondary | ICD-10-CM

## 2013-08-19 DIAGNOSIS — O24919 Unspecified diabetes mellitus in pregnancy, unspecified trimester: Secondary | ICD-10-CM

## 2013-08-19 DIAGNOSIS — O24312 Unspecified pre-existing diabetes mellitus in pregnancy, second trimester: Secondary | ICD-10-CM

## 2013-08-19 DIAGNOSIS — E1029 Type 1 diabetes mellitus with other diabetic kidney complication: Secondary | ICD-10-CM

## 2013-08-19 LAB — POCT URINALYSIS DIP (DEVICE)
Bilirubin Urine: NEGATIVE
Glucose, UA: NEGATIVE mg/dL
Nitrite: NEGATIVE
Specific Gravity, Urine: 1.025 (ref 1.005–1.030)
Urobilinogen, UA: 0.2 mg/dL (ref 0.0–1.0)

## 2013-08-19 NOTE — Progress Notes (Signed)
DIABETES: Pump uploaded, reviewed by Melinda Crutch RN, CDE [employer]. Kimberly [name] is only testing on an average of 3X daily. This is not providing adequate information for good management. 1. 8AM alert set to check FBG 2. Ck BG Reminder turned on so she can get it after bolus to ck 2 hpp 3. She is goint to ck FBS, before mealds, 2hpp, HS and occassional midsleep this week. She returns to clinic on 08/26/13 @ 8AM will upload then and decide on any rate adjustments. Darla Lesches, RN, CDE

## 2013-08-19 NOTE — Patient Instructions (Signed)
Return to clinic for any obstetric concerns or go to [facility] for evaluation  

## 2013-08-19 NOTE — Progress Notes (Signed)
On insulin pump, Ulyses Jarred RN, CDE will download blood sugars to evaluate them and help with titrating regimen of insulin pump.  No other complaints or concerns.  Fetal movement and labor precautions reviewed.

## 2013-08-19 NOTE — Progress Notes (Signed)
Pulse: 99 

## 2013-08-24 ENCOUNTER — Encounter: Payer: Self-pay | Admitting: *Deleted

## 2013-08-26 ENCOUNTER — Encounter: Payer: Self-pay | Admitting: Obstetrics & Gynecology

## 2013-08-26 ENCOUNTER — Telehealth: Payer: Self-pay | Admitting: *Deleted

## 2013-08-26 ENCOUNTER — Ambulatory Visit (HOSPITAL_COMMUNITY)
Admission: RE | Admit: 2013-08-26 | Discharge: 2013-08-26 | Disposition: A | Discharge status: Home / Self Care | Payer: PRIVATE HEALTH INSURANCE | Source: Ambulatory Visit | Attending: Obstetrics & Gynecology | Admitting: Obstetrics & Gynecology

## 2013-08-26 VITALS — BP 120/69 | HR 102 | Wt 186.5 lb

## 2013-08-26 DIAGNOSIS — Z3689 Encounter for other specified antenatal screening: Secondary | ICD-10-CM | POA: Insufficient documentation

## 2013-08-26 DIAGNOSIS — O24919 Unspecified diabetes mellitus in pregnancy, unspecified trimester: Secondary | ICD-10-CM | POA: Insufficient documentation

## 2013-08-26 DIAGNOSIS — E1021 Type 1 diabetes mellitus with diabetic nephropathy: Secondary | ICD-10-CM

## 2013-08-26 DIAGNOSIS — E1129 Type 2 diabetes mellitus with other diabetic kidney complication: Secondary | ICD-10-CM | POA: Insufficient documentation

## 2013-08-26 DIAGNOSIS — O24312 Unspecified pre-existing diabetes mellitus in pregnancy, second trimester: Secondary | ICD-10-CM

## 2013-08-26 NOTE — Telephone Encounter (Signed)
Left message about restrictions concerning restriction regarding anyone under the age of 54 due to the flu.

## 2013-08-29 NOTE — L&D Delivery Note (Signed)
Operative Delivery Note At 5:19 PM a viable female was delivered via Vaginal, Vacuum Neurosurgeon).  Presentation: vertex; Position: Left,, Occiput,, Anterior; Station: +2.  Verbal consent: obtained from patient.  Risks and benefits discussed in detail.  Risks include, but are not limited to the risks of anesthesia, bleeding, infection, damage to maternal tissues, fetal cephalhematoma.  There is also the risk of inability to effect vaginal delivery of the head, or shoulder dystocia that cannot be resolved by established maneuvers, leading to the need for emergency cesarean section.  APGAR: 6, 8; weight 5 lb 3.6 oz (2370 g).   Placenta status: Intact, Spontaneous.   Cord: 3 vessels with the following complications: None.  Cord pH: 7.15  Anesthesia: Local Epidural  Instruments: KIWI Vacuum Episiotomy: none Lacerations: partial 3rd degree Suture Repair: 3.0 vicryl Est. Blood Loss (mL): 300  Mom to postpartum.  Baby to Couplet care / Skin to Skin.  Patient was examined and found to be fully dilated with fetal station of +2.  Persistent late decels noted on the tracing increasing in length with minimal variability.  Given this, it was felt necessary to proceed with vacuum delivery.  NICU team called and was standing by.  The soft vacuum soft cup was positioned over the sagittal suture 3 cm anterior to posterior fontanelle.  Pressure was then increased to 500 mmHg, and the patient was instructed to push.  Pulling was administered along the pelvic curve.  3 pulls were administered over 3 contractions, one popoffs.  The infant was then delivered atraumatically, noted to be a viable female infant, Apgars of 6 and 8, weight was 5lb  pounds 3.6 ounces.  There was spontaneous placental delivery, intact with three-vessel cord.  Partial 3rd degree tear repaired in the usual fashion using alice clamps to grasp the capsule. A rectal exam at the end of the repair was normal with good sphincter tone. EBL300, epidural  anesthesia.   Sponge, instrument and needle counts were correct x2.  The patient and baby were stable after delivery.  Dr. Ihor Dow aws present for delivery of this newborn and Dr. Glo Herring was present for the repair.   Dayna Alia L 10/23/2013, 6:56 PM

## 2013-08-29 NOTE — L&D Delivery Note (Signed)
`````  Attestation of Attending Supervision of Advanced Practitioner: Evaluation and management procedures were performed by the PA/NP/CNM/OB Fellow under my supervision/collaboration. Chart reviewed and agree with management and plan.  Jonnie Kind 10/23/2013 7:47 PM

## 2013-09-02 ENCOUNTER — Encounter: Payer: Self-pay | Admitting: *Deleted

## 2013-09-02 ENCOUNTER — Ambulatory Visit (INDEPENDENT_AMBULATORY_CARE_PROVIDER_SITE_OTHER): Payer: PRIVATE HEALTH INSURANCE | Admitting: Obstetrics & Gynecology

## 2013-09-02 ENCOUNTER — Encounter: Payer: PRIVATE HEALTH INSURANCE | Attending: Obstetrics & Gynecology | Admitting: *Deleted

## 2013-09-02 VITALS — BP 120/84 | Temp 97.3°F | Wt 190.6 lb

## 2013-09-02 DIAGNOSIS — O24919 Unspecified diabetes mellitus in pregnancy, unspecified trimester: Secondary | ICD-10-CM

## 2013-09-02 DIAGNOSIS — Z713 Dietary counseling and surveillance: Secondary | ICD-10-CM | POA: Insufficient documentation

## 2013-09-02 DIAGNOSIS — E1021 Type 1 diabetes mellitus with diabetic nephropathy: Secondary | ICD-10-CM

## 2013-09-02 DIAGNOSIS — Z23 Encounter for immunization: Secondary | ICD-10-CM

## 2013-09-02 DIAGNOSIS — O99810 Abnormal glucose complicating pregnancy: Secondary | ICD-10-CM | POA: Insufficient documentation

## 2013-09-02 DIAGNOSIS — N058 Unspecified nephritic syndrome with other morphologic changes: Secondary | ICD-10-CM

## 2013-09-02 DIAGNOSIS — E1029 Type 1 diabetes mellitus with other diabetic kidney complication: Secondary | ICD-10-CM

## 2013-09-02 DIAGNOSIS — O24312 Unspecified pre-existing diabetes mellitus in pregnancy, second trimester: Secondary | ICD-10-CM

## 2013-09-02 LAB — HIV ANTIBODY (ROUTINE TESTING W REFLEX): HIV: NONREACTIVE

## 2013-09-02 LAB — COMPREHENSIVE METABOLIC PANEL
AST: 11 U/L (ref 0–37)
Albumin: 3.1 g/dL — ABNORMAL LOW (ref 3.5–5.2)
Alkaline Phosphatase: 66 U/L (ref 39–117)
BILIRUBIN TOTAL: 0.3 mg/dL (ref 0.3–1.2)
BUN: 8 mg/dL (ref 6–23)
CHLORIDE: 105 meq/L (ref 96–112)
CO2: 22 meq/L (ref 19–32)
Calcium: 8 mg/dL — ABNORMAL LOW (ref 8.4–10.5)
Creat: 0.39 mg/dL — ABNORMAL LOW (ref 0.50–1.10)
Glucose, Bld: 172 mg/dL — ABNORMAL HIGH (ref 70–99)
Potassium: 4.1 mEq/L (ref 3.5–5.3)
Sodium: 136 mEq/L (ref 135–145)
TOTAL PROTEIN: 5.8 g/dL — AB (ref 6.0–8.3)

## 2013-09-02 LAB — POCT URINALYSIS DIP (DEVICE)
BILIRUBIN URINE: NEGATIVE
Glucose, UA: NEGATIVE mg/dL
KETONES UR: NEGATIVE mg/dL
NITRITE: NEGATIVE
PH: 5.5 (ref 5.0–8.0)
Protein, ur: NEGATIVE mg/dL
Specific Gravity, Urine: 1.02 (ref 1.005–1.030)
Urobilinogen, UA: 0.2 mg/dL (ref 0.0–1.0)

## 2013-09-02 LAB — RPR

## 2013-09-02 LAB — CBC
HCT: 32.2 % — ABNORMAL LOW (ref 36.0–46.0)
HEMOGLOBIN: 11.1 g/dL — AB (ref 12.0–15.0)
MCH: 25.8 pg — AB (ref 26.0–34.0)
MCHC: 34.5 g/dL (ref 30.0–36.0)
MCV: 74.9 fL — AB (ref 78.0–100.0)
Platelets: 206 10*3/uL (ref 150–400)
RBC: 4.3 MIL/uL (ref 3.87–5.11)
RDW: 14.5 % (ref 11.5–15.5)
WBC: 9.2 10*3/uL (ref 4.0–10.5)

## 2013-09-02 LAB — HEMOGLOBIN A1C
Hgb A1c MFr Bld: 6.9 % — ABNORMAL HIGH (ref <5.7)
MEAN PLASMA GLUCOSE: 151 mg/dL — AB (ref <117)

## 2013-09-02 MED ORDER — TETANUS-DIPHTH-ACELL PERTUSSIS 5-2.5-18.5 LF-MCG/0.5 IM SUSP
0.5000 mL | Freq: Once | INTRAMUSCULAR | Status: AC
  Administered 2013-09-02: 0.5 mL via INTRAMUSCULAR

## 2013-09-02 NOTE — Progress Notes (Signed)
Pulse: 93

## 2013-09-02 NOTE — Progress Notes (Signed)
DIABETES: Pump upload and reviewed.Advised to change infusion set every 3 days. Monitor FBS. If consistently >100 for next three days, upload to [place] and text Izora Gala for review. If up during the night.testing BG before having a snack and always add protein to snack.

## 2013-09-02 NOTE — Progress Notes (Signed)
Patient reports BS in 120-150 range, but had elevated value of 205 this morning.  Insulin pump in place, readings uploaded to [employer] and will be reviewed/managed by Melinda Crutch, RN.  08/26/13 EFW 49%.  Will start weekly BPP at 28-32 weeks, then twice/week testing.  Third trimester labs, CMET, HgAIC, surveillance urine protein/Cr to be checked today.  Counseled about Tdap, will be given today.   No other complaints or concerns.  Fetal movement and labor precautions reviewed.

## 2013-09-02 NOTE — Patient Instructions (Signed)
Return to clinic for any obstetric concerns or go to [facility] for evaluation  

## 2013-09-02 NOTE — Progress Notes (Signed)
BPP scheduled 09/09/13 at 9 am in [facility].

## 2013-09-03 LAB — PROTEIN / CREATININE RATIO, URINE
CREATININE, URINE: 87.9 mg/dL
Protein Creatinine Ratio: 0.23 — ABNORMAL HIGH (ref <0.15)
Total Protein, Urine: 20 mg/dL

## 2013-09-05 ENCOUNTER — Telehealth: Payer: Self-pay | Admitting: *Deleted

## 2013-09-09 ENCOUNTER — Other Ambulatory Visit: Payer: Self-pay | Admitting: Obstetrics & Gynecology

## 2013-09-09 ENCOUNTER — Ambulatory Visit (HOSPITAL_COMMUNITY)
Admission: RE | Admit: 2013-09-09 | Discharge: 2013-09-09 | Disposition: A | Discharge status: Home / Self Care | Payer: PRIVATE HEALTH INSURANCE | Source: Ambulatory Visit | Attending: Obstetrics & Gynecology | Admitting: Obstetrics & Gynecology

## 2013-09-09 DIAGNOSIS — E1129 Type 2 diabetes mellitus with other diabetic kidney complication: Secondary | ICD-10-CM | POA: Insufficient documentation

## 2013-09-09 DIAGNOSIS — O24919 Unspecified diabetes mellitus in pregnancy, unspecified trimester: Secondary | ICD-10-CM | POA: Insufficient documentation

## 2013-09-09 DIAGNOSIS — N058 Unspecified nephritic syndrome with other morphologic changes: Secondary | ICD-10-CM | POA: Insufficient documentation

## 2013-09-09 DIAGNOSIS — O24312 Unspecified pre-existing diabetes mellitus in pregnancy, second trimester: Secondary | ICD-10-CM

## 2013-09-09 NOTE — Consult Note (Signed)
DIABETES: pump upload and review by Melinda Crutch, RN, BSN, CDE ([employer]) Encouraged increase of glucose checking 1-2 more times daily to a total of 5-6 per day in order to better adjust rates. -No more low readings Recent A1c 6.9% Basal Change: 0:00   0.90u 4:00   1.10u 8:00    0.950u 18:00  0.900u Sensitivity change to 45 from 5:00p-MN Report scanned to chart

## 2013-09-10 ENCOUNTER — Encounter: Payer: Self-pay | Admitting: *Deleted

## 2013-09-12 ENCOUNTER — Other Ambulatory Visit: Payer: Self-pay | Admitting: Obstetrics & Gynecology

## 2013-09-12 DIAGNOSIS — O24312 Unspecified pre-existing diabetes mellitus in pregnancy, second trimester: Secondary | ICD-10-CM

## 2013-09-12 DIAGNOSIS — O24919 Unspecified diabetes mellitus in pregnancy, unspecified trimester: Secondary | ICD-10-CM

## 2013-09-16 ENCOUNTER — Ambulatory Visit (INDEPENDENT_AMBULATORY_CARE_PROVIDER_SITE_OTHER): Payer: PRIVATE HEALTH INSURANCE | Admitting: Obstetrics & Gynecology

## 2013-09-16 VITALS — BP 133/83 | Temp 97.3°F | Wt 190.3 lb

## 2013-09-16 DIAGNOSIS — O24319 Unspecified pre-existing diabetes mellitus in pregnancy, unspecified trimester: Secondary | ICD-10-CM

## 2013-09-16 DIAGNOSIS — E119 Type 2 diabetes mellitus without complications: Secondary | ICD-10-CM

## 2013-09-16 DIAGNOSIS — O24919 Unspecified diabetes mellitus in pregnancy, unspecified trimester: Secondary | ICD-10-CM

## 2013-09-16 LAB — POCT URINALYSIS DIP (DEVICE)
Bilirubin Urine: NEGATIVE
Glucose, UA: NEGATIVE mg/dL
KETONES UR: NEGATIVE mg/dL
Nitrite: NEGATIVE
PROTEIN: 30 mg/dL — AB
Urobilinogen, UA: 1 mg/dL (ref 0.0–1.0)
pH: 6 (ref 5.0–8.0)

## 2013-09-16 NOTE — Progress Notes (Signed)
Pulse: 97

## 2013-09-16 NOTE — Progress Notes (Signed)
DIABETES: Pump upload, reviewed, modifications in regimen made by Melinda Crutch, RN, BSN, CDE ([employer]) Reports and modifications scanned to chart by [employer].

## 2013-09-16 NOTE — Progress Notes (Signed)
Encouraged pt to pick pediatrician.  Pump analysis by diabetes education. Has eye exam scheduled for March.  Reviewed birth control options--nexplanon and mirena encouraged.

## 2013-09-16 NOTE — Patient Instructions (Signed)
Etonogestrel implant What is this medicine? ETONOGESTREL (et oh noe JES trel) is a contraceptive (birth control) device. It is used to prevent pregnancy. It can be used for up to 3 years. This medicine may be used for other purposes; ask your health care provider or pharmacist if you have questions. COMMON BRAND NAME(S): Implanon, Nexplanon  What should I tell my health care provider before I take this medicine? They need to know if you have any of these conditions: -abnormal vaginal bleeding -blood vessel disease or blood clots -cancer of the breast, cervix, or liver -depression -diabetes -gallbladder disease -headaches -heart disease or recent heart attack -high blood pressure -high cholesterol -kidney disease -liver disease -renal disease -seizures -tobacco smoker -an unusual or allergic reaction to etonogestrel, other hormones, anesthetics or antiseptics, medicines, foods, dyes, or preservatives -pregnant or trying to get pregnant -breast-feeding How should I use this medicine? This device is inserted just under the skin on the inner side of your upper arm by a health care professional. Talk to your pediatrician regarding the use of this medicine in children. Special care may be needed. Overdosage: If you think you've taken too much of this medicine contact a poison control center or emergency room at once. Overdosage: If you think you have taken too much of this medicine contact a poison control center or emergency room at once. NOTE: This medicine is only for you. Do not share this medicine with others. What if I miss a dose? This does not apply. What may interact with this medicine? Do not take this medicine with any of the following medications: -amprenavir -bosentan -fosamprenavir This medicine may also interact with the following medications: -barbiturate medicines for inducing sleep or treating seizures -certain medicines for fungal infections like ketoconazole and  itraconazole -griseofulvin -medicines to treat seizures like carbamazepine, felbamate, oxcarbazepine, phenytoin, topiramate -modafinil -phenylbutazone -rifampin -some medicines to treat HIV infection like atazanavir, indinavir, lopinavir, nelfinavir, tipranavir, ritonavir -St. John's wort This list may not describe all possible interactions. Give your health care provider a list of all the medicines, herbs, non-prescription drugs, or dietary supplements you use. Also tell them if you smoke, drink alcohol, or use illegal drugs. Some items may interact with your medicine. What should I watch for while using this medicine? This product does not protect you against HIV infection (AIDS) or other sexually transmitted diseases. You should be able to feel the implant by pressing your fingertips over the skin where it was inserted. Tell your doctor if you cannot feel the implant. What side effects may I notice from receiving this medicine? Side effects that you should report to your doctor or health care professional as soon as possible: -allergic reactions like skin rash, itching or hives, swelling of the face, lips, or tongue -breast lumps -changes in vision -confusion, trouble speaking or understanding -dark urine -depressed mood -general ill feeling or flu-like symptoms -light-colored stools -loss of appetite, nausea -right upper belly pain -severe headaches -severe pain, swelling, or tenderness in the abdomen -shortness of breath, chest pain, swelling in a leg -signs of pregnancy -sudden numbness or weakness of the face, arm or leg -trouble walking, dizziness, loss of balance or coordination -unusual vaginal bleeding, discharge -unusually weak or tired -yellowing of the eyes or skin Side effects that usually do not require medical attention (Report these to your doctor or health care professional if they continue or are bothersome.): -acne -breast pain -changes in  weight -cough -fever or chills -headache -irregular menstrual bleeding -itching, burning,   and vaginal discharge -pain or difficulty passing urine -sore throat This list may not describe all possible side effects. Call your doctor for medical advice about side effects. You may report side effects to FDA at 1-800-FDA-1088. Where should I keep my medicine? This drug is given in a hospital or clinic and will not be stored at home. NOTE: This sheet is a summary. It may not cover all possible information. If you have questions about this medicine, talk to your doctor, pharmacist, or health care provider.  2014, Elsevier/Gold Standard. (2012-02-20 15:37:45) Levonorgestrel intrauterine device (IUD) What is this medicine? LEVONORGESTREL IUD (LEE voe nor jes trel) is a contraceptive (birth control) device. The device is placed inside the uterus by a healthcare professional. It is used to prevent pregnancy and can also be used to treat heavy bleeding that occurs during your period. Depending on the device, it can be used for 3 to 5 years. This medicine may be used for other purposes; ask your health care provider or pharmacist if you have questions. COMMON BRAND NAME(S): Jerral Bonito What should I tell my health care provider before I take this medicine? They need to know if you have any of these conditions: -abnormal Pap smear -cancer of the breast, uterus, or cervix -diabetes -endometritis -genital or pelvic infection now or in the past -have more than one sexual partner or your partner has more than one partner -heart disease -history of an ectopic or tubal pregnancy -immune system problems -IUD in place -liver disease or tumor -problems with blood clots or take blood-thinners -use intravenous drugs -uterus of unusual shape -vaginal bleeding that has not been explained -an unusual or allergic reaction to levonorgestrel, other hormones, silicone, or polyethylene, medicines, foods, dyes,  or preservatives -pregnant or trying to get pregnant -breast-feeding How should I use this medicine? This device is placed inside the uterus by a health care professional. Talk to your pediatrician regarding the use of this medicine in children. Special care may be needed. Overdosage: If you think you have taken too much of this medicine contact a poison control center or emergency room at once. NOTE: This medicine is only for you. Do not share this medicine with others. What if I miss a dose? This does not apply. What may interact with this medicine? Do not take this medicine with any of the following medications: -amprenavir -bosentan -fosamprenavir This medicine may also interact with the following medications: -aprepitant -barbiturate medicines for inducing sleep or treating seizures -bexarotene -griseofulvin -medicines to treat seizures like carbamazepine, ethotoin, felbamate, oxcarbazepine, phenytoin, topiramate -modafinil -pioglitazone -rifabutin -rifampin -rifapentine -some medicines to treat HIV infection like atazanavir, indinavir, lopinavir, nelfinavir, tipranavir, ritonavir -St. John's wort -warfarin This list may not describe all possible interactions. Give your health care provider a list of all the medicines, herbs, non-prescription drugs, or dietary supplements you use. Also tell them if you smoke, drink alcohol, or use illegal drugs. Some items may interact with your medicine. What should I watch for while using this medicine? Visit your doctor or health care professional for regular check ups. See your doctor if you or your partner has sexual contact with others, becomes HIV positive, or gets a sexual transmitted disease. This product does not protect you against HIV infection (AIDS) or other sexually transmitted diseases. You can check the placement of the IUD yourself by reaching up to the top of your vagina with clean fingers to feel the threads. Do not pull on  the threads. It is a good  habit to check placement after each menstrual period. Call your doctor right away if you feel more of the IUD than just the threads or if you cannot feel the threads at all. The IUD may come out by itself. You may become pregnant if the device comes out. If you notice that the IUD has come out use a backup birth control method like condoms and call your health care provider. Using tampons will not change the position of the IUD and are okay to use during your period. What side effects may I notice from receiving this medicine? Side effects that you should report to your doctor or health care professional as soon as possible: -allergic reactions like skin rash, itching or hives, swelling of the face, lips, or tongue -fever, flu-like symptoms -genital sores -high blood pressure -no menstrual period for 6 weeks during use -pain, swelling, warmth in the leg -pelvic pain or tenderness -severe or sudden headache -signs of pregnancy -stomach cramping -sudden shortness of breath -trouble with balance, talking, or walking -unusual vaginal bleeding, discharge -yellowing of the eyes or skin Side effects that usually do not require medical attention (report to your doctor or health care professional if they continue or are bothersome): -acne -breast pain -change in sex drive or performance -changes in weight -cramping, dizziness, or faintness while the device is being inserted -headache -irregular menstrual bleeding within first 3 to 6 months of use -nausea This list may not describe all possible side effects. Call your doctor for medical advice about side effects. You may report side effects to FDA at 1-800-FDA-1088. Where should I keep my medicine? This does not apply. NOTE: This sheet is a summary. It may not cover all possible information. If you have questions about this medicine, talk to your doctor, pharmacist, or health care provider.  2014, Elsevier/Gold  Standard. (2011-09-15 13:54:04)

## 2013-09-17 ENCOUNTER — Ambulatory Visit (HOSPITAL_COMMUNITY)
Admission: RE | Admit: 2013-09-17 | Discharge: 2013-09-17 | Disposition: A | Discharge status: Home / Self Care | Payer: PRIVATE HEALTH INSURANCE | Source: Ambulatory Visit | Attending: Obstetrics & Gynecology | Admitting: Obstetrics & Gynecology

## 2013-09-17 DIAGNOSIS — O24919 Unspecified diabetes mellitus in pregnancy, unspecified trimester: Secondary | ICD-10-CM | POA: Insufficient documentation

## 2013-09-17 DIAGNOSIS — N058 Unspecified nephritic syndrome with other morphologic changes: Secondary | ICD-10-CM | POA: Insufficient documentation

## 2013-09-17 DIAGNOSIS — E1129 Type 2 diabetes mellitus with other diabetic kidney complication: Secondary | ICD-10-CM | POA: Insufficient documentation

## 2013-09-23 ENCOUNTER — Ambulatory Visit (INDEPENDENT_AMBULATORY_CARE_PROVIDER_SITE_OTHER): Payer: PRIVATE HEALTH INSURANCE | Admitting: Obstetrics & Gynecology

## 2013-09-23 ENCOUNTER — Ambulatory Visit (HOSPITAL_COMMUNITY)
Admission: RE | Admit: 2013-09-23 | Discharge: 2013-09-23 | Disposition: A | Discharge status: Home / Self Care | Payer: PRIVATE HEALTH INSURANCE | Source: Ambulatory Visit | Attending: Obstetrics & Gynecology | Admitting: Obstetrics & Gynecology

## 2013-09-23 VITALS — BP 133/84 | Wt 192.7 lb

## 2013-09-23 DIAGNOSIS — E1021 Type 1 diabetes mellitus with diabetic nephropathy: Secondary | ICD-10-CM

## 2013-09-23 DIAGNOSIS — O24919 Unspecified diabetes mellitus in pregnancy, unspecified trimester: Secondary | ICD-10-CM | POA: Insufficient documentation

## 2013-09-23 DIAGNOSIS — O24319 Unspecified pre-existing diabetes mellitus in pregnancy, unspecified trimester: Secondary | ICD-10-CM

## 2013-09-23 DIAGNOSIS — O24312 Unspecified pre-existing diabetes mellitus in pregnancy, second trimester: Secondary | ICD-10-CM

## 2013-09-23 DIAGNOSIS — N058 Unspecified nephritic syndrome with other morphologic changes: Secondary | ICD-10-CM

## 2013-09-23 DIAGNOSIS — E1029 Type 1 diabetes mellitus with other diabetic kidney complication: Secondary | ICD-10-CM

## 2013-09-23 DIAGNOSIS — E119 Type 2 diabetes mellitus without complications: Secondary | ICD-10-CM

## 2013-09-23 LAB — POCT URINALYSIS DIP (DEVICE)
Bilirubin Urine: NEGATIVE
Glucose, UA: 250 mg/dL — AB
Ketones, ur: NEGATIVE mg/dL
Leukocytes, UA: NEGATIVE
Nitrite: NEGATIVE
PH: 6 (ref 5.0–8.0)
PROTEIN: 100 mg/dL — AB
UROBILINOGEN UA: 0.2 mg/dL (ref 0.0–1.0)

## 2013-09-23 NOTE — Progress Notes (Signed)
On insulin pump, CBG analysis done with help of diabetes education 09/23/13 scan at [facility] EFW 45%, BPP 8/8, AFI 9 cm. Continue antenatal testing, weekly BPP until 32 weeks then 2x/week testing No other complaints or concerns.  Fetal movement and labor precautions reviewed.

## 2013-09-23 NOTE — Patient Instructions (Signed)
Return to clinic for any obstetric concerns or go to [facility] for evaluation  

## 2013-09-23 NOTE — Progress Notes (Signed)
Pulse-105 

## 2013-09-23 NOTE — Progress Notes (Signed)
DIABETES: Pump uploaded by Dara Lords, reviewed by Melinda Crutch, RN, BSN, CDE ([employer]) Patient notified by [facility] of pump setting changes to address 0400 hypoglycemia. Report scanned to chart by Danton Sewer RN

## 2013-09-25 ENCOUNTER — Encounter: Payer: Self-pay | Admitting: *Deleted

## 2013-09-26 ENCOUNTER — Other Ambulatory Visit (HOSPITAL_COMMUNITY): Payer: Self-pay | Admitting: Maternal and Fetal Medicine

## 2013-09-26 DIAGNOSIS — E109 Type 1 diabetes mellitus without complications: Secondary | ICD-10-CM

## 2013-09-26 DIAGNOSIS — O24019 Pre-existing diabetes mellitus, type 1, in pregnancy, unspecified trimester: Secondary | ICD-10-CM

## 2013-09-30 ENCOUNTER — Encounter: Payer: PRIVATE HEALTH INSURANCE | Attending: Obstetrics & Gynecology | Admitting: *Deleted

## 2013-09-30 ENCOUNTER — Ambulatory Visit (INDEPENDENT_AMBULATORY_CARE_PROVIDER_SITE_OTHER): Payer: PRIVATE HEALTH INSURANCE | Admitting: Obstetrics & Gynecology

## 2013-09-30 VITALS — BP 127/86 | Temp 97.7°F | Wt 192.7 lb

## 2013-09-30 DIAGNOSIS — O24919 Unspecified diabetes mellitus in pregnancy, unspecified trimester: Secondary | ICD-10-CM

## 2013-09-30 DIAGNOSIS — E1029 Type 1 diabetes mellitus with other diabetic kidney complication: Secondary | ICD-10-CM

## 2013-09-30 DIAGNOSIS — O99810 Abnormal glucose complicating pregnancy: Secondary | ICD-10-CM | POA: Insufficient documentation

## 2013-09-30 DIAGNOSIS — E1021 Type 1 diabetes mellitus with diabetic nephropathy: Secondary | ICD-10-CM

## 2013-09-30 DIAGNOSIS — O24319 Unspecified pre-existing diabetes mellitus in pregnancy, unspecified trimester: Secondary | ICD-10-CM

## 2013-09-30 DIAGNOSIS — N058 Unspecified nephritic syndrome with other morphologic changes: Secondary | ICD-10-CM

## 2013-09-30 DIAGNOSIS — E119 Type 2 diabetes mellitus without complications: Secondary | ICD-10-CM

## 2013-09-30 DIAGNOSIS — Z713 Dietary counseling and surveillance: Secondary | ICD-10-CM | POA: Insufficient documentation

## 2013-09-30 LAB — POCT URINALYSIS DIP (DEVICE)
Glucose, UA: 500 mg/dL — AB
Ketones, ur: NEGATIVE mg/dL
Leukocytes, UA: NEGATIVE
NITRITE: NEGATIVE
Protein, ur: 100 mg/dL — AB
Specific Gravity, Urine: >=1.03 (ref 1.005–1.030)
UROBILINOGEN UA: 1 mg/dL (ref 0.0–1.0)
pH: 6 (ref 5.0–8.0)

## 2013-09-30 NOTE — Patient Instructions (Signed)
Return to clinic for any obstetric concerns or go to [facility] for evaluation  

## 2013-09-30 NOTE — Progress Notes (Signed)
P-89 

## 2013-09-30 NOTE — Progress Notes (Signed)
Scheduled for BPP tomorrow (10/01/13), will start 2x/week testing next week here in clinic.   On insulin pump, BS analysis done with help of diabetes education (Ulyses Jarred, RN, CDE)  and Melinda Crutch ([employer]) No other complaints or concerns.  Fetal movement and labor precautions reviewed.

## 2013-09-30 NOTE — Progress Notes (Signed)
DIABETES: Pump uploaded, No setting changes were made. Report scanned to chart by Danton Sewer RN

## 2013-10-01 ENCOUNTER — Other Ambulatory Visit (HOSPITAL_COMMUNITY): Payer: Self-pay | Admitting: Maternal and Fetal Medicine

## 2013-10-01 ENCOUNTER — Ambulatory Visit (HOSPITAL_COMMUNITY)
Admission: RE | Admit: 2013-10-01 | Discharge: 2013-10-01 | Disposition: A | Discharge status: Home / Self Care | Payer: PRIVATE HEALTH INSURANCE | Source: Ambulatory Visit | Attending: Obstetrics & Gynecology | Admitting: Obstetrics & Gynecology

## 2013-10-01 DIAGNOSIS — O24919 Unspecified diabetes mellitus in pregnancy, unspecified trimester: Secondary | ICD-10-CM | POA: Insufficient documentation

## 2013-10-01 DIAGNOSIS — E109 Type 1 diabetes mellitus without complications: Secondary | ICD-10-CM

## 2013-10-01 DIAGNOSIS — E1129 Type 2 diabetes mellitus with other diabetic kidney complication: Secondary | ICD-10-CM | POA: Insufficient documentation

## 2013-10-07 ENCOUNTER — Ambulatory Visit (HOSPITAL_COMMUNITY)
Admission: RE | Admit: 2013-10-07 | Discharge: 2013-10-07 | Disposition: A | Discharge status: Home / Self Care | Payer: PRIVATE HEALTH INSURANCE | Source: Ambulatory Visit | Attending: Family Medicine | Admitting: Family Medicine

## 2013-10-07 ENCOUNTER — Ambulatory Visit (INDEPENDENT_AMBULATORY_CARE_PROVIDER_SITE_OTHER): Payer: PRIVATE HEALTH INSURANCE | Admitting: Obstetrics & Gynecology

## 2013-10-07 VITALS — BP 142/92 | Temp 98.7°F | Wt 200.9 lb

## 2013-10-07 DIAGNOSIS — E1021 Type 1 diabetes mellitus with diabetic nephropathy: Secondary | ICD-10-CM

## 2013-10-07 DIAGNOSIS — E1029 Type 1 diabetes mellitus with other diabetic kidney complication: Secondary | ICD-10-CM | POA: Diagnosis not present

## 2013-10-07 DIAGNOSIS — O24319 Unspecified pre-existing diabetes mellitus in pregnancy, unspecified trimester: Secondary | ICD-10-CM

## 2013-10-07 DIAGNOSIS — N058 Unspecified nephritic syndrome with other morphologic changes: Secondary | ICD-10-CM

## 2013-10-07 DIAGNOSIS — O139 Gestational [pregnancy-induced] hypertension without significant proteinuria, unspecified trimester: Secondary | ICD-10-CM

## 2013-10-07 DIAGNOSIS — E119 Type 2 diabetes mellitus without complications: Secondary | ICD-10-CM

## 2013-10-07 DIAGNOSIS — O24919 Unspecified diabetes mellitus in pregnancy, unspecified trimester: Secondary | ICD-10-CM

## 2013-10-07 DIAGNOSIS — O163 Unspecified maternal hypertension, third trimester: Secondary | ICD-10-CM

## 2013-10-07 DIAGNOSIS — O1400 Mild to moderate pre-eclampsia, unspecified trimester: Secondary | ICD-10-CM | POA: Insufficient documentation

## 2013-10-07 DIAGNOSIS — E109 Type 1 diabetes mellitus without complications: Secondary | ICD-10-CM

## 2013-10-07 DIAGNOSIS — E1129 Type 2 diabetes mellitus with other diabetic kidney complication: Secondary | ICD-10-CM | POA: Insufficient documentation

## 2013-10-07 LAB — POCT URINALYSIS DIP (DEVICE)
BILIRUBIN URINE: NEGATIVE
GLUCOSE, UA: NEGATIVE mg/dL
KETONES UR: NEGATIVE mg/dL
LEUKOCYTES UA: NEGATIVE
Nitrite: NEGATIVE
PH: 7 (ref 5.0–8.0)
Protein, ur: 100 mg/dL — AB
Specific Gravity, Urine: >=1.03 (ref 1.005–1.030)
Urobilinogen, UA: 1 mg/dL (ref 0.0–1.0)

## 2013-10-07 LAB — COMPREHENSIVE METABOLIC PANEL
ALT: 13 U/L (ref 0–35)
AST: 25 U/L (ref 0–37)
Albumin: 2.9 g/dL — ABNORMAL LOW (ref 3.5–5.2)
Alkaline Phosphatase: 98 U/L (ref 39–117)
BILIRUBIN TOTAL: 0.2 mg/dL (ref 0.2–1.2)
BUN: 11 mg/dL (ref 6–23)
CO2: 18 mEq/L — ABNORMAL LOW (ref 19–32)
CREATININE: 0.48 mg/dL — AB (ref 0.50–1.10)
Calcium: 8.3 mg/dL — ABNORMAL LOW (ref 8.4–10.5)
Chloride: 105 mEq/L (ref 96–112)
GLUCOSE: 133 mg/dL — AB (ref 70–99)
Potassium: 4.2 mEq/L (ref 3.5–5.3)
SODIUM: 134 meq/L — AB (ref 135–145)
TOTAL PROTEIN: 5.5 g/dL — AB (ref 6.0–8.3)

## 2013-10-07 LAB — CBC
HCT: 33.8 % — ABNORMAL LOW (ref 36.0–46.0)
Hemoglobin: 11.5 g/dL — ABNORMAL LOW (ref 12.0–15.0)
MCH: 25.8 pg — ABNORMAL LOW (ref 26.0–34.0)
MCHC: 34 g/dL (ref 30.0–36.0)
MCV: 75.8 fL — ABNORMAL LOW (ref 78.0–100.0)
Platelets: 214 10*3/uL (ref 150–400)
RBC: 4.46 MIL/uL (ref 3.87–5.11)
RDW: 13.8 % (ref 11.5–15.5)
WBC: 9.2 10*3/uL (ref 4.0–10.5)

## 2013-10-07 NOTE — Progress Notes (Signed)
[facility] ultrasound  Indication: 23 yr old G1P0 at [redacted]w[redacted]d with type I diabetes for BPP.  Findings: 1. Single intrauterine pregnancy. 2. Posterior placenta without evidence of previa. 3. Normal amniotic fluid volume. 4. Normal biophysical profile of 8/8.  Recommendations: 1. Diabetes: - previously counseled - on insulin pump - had normal fetal echocardiogram - recommend fetal growth every 4 weeks; due in 2 weeks - recommend continue antenatal testing; normal BPP today; to start twice weekly NSTs and weekly AFI next week - recommend delivery by estimated due date 2. Patient had elevated BP today: - was seen by primary OB who ordered labs - patient is asymptomatic - per review of the chart patient had elevated diastolic blood pressures early in the second trimester therefore suspect this is chronic hypertension - however, recommend close surveillance for the development of signs/symptoms of preeclampsia - management based on diagnosis  [place], MD

## 2013-10-07 NOTE — Progress Notes (Signed)
P= 88 BP done manually.  Edema in hands.

## 2013-10-07 NOTE — Patient Instructions (Signed)
Return to clinic for any obstetric concerns or go to [facility] for evaluation  

## 2013-10-07 NOTE — Progress Notes (Signed)
Diabetes: pump uploaded. Changes made to settings. Reports and recommendations scanned to chart per Arcola Jansky RN

## 2013-10-07 NOTE — ED Notes (Signed)
Pt seen in clinic this morning.  Kimberly [name] labs drawn.

## 2013-10-07 NOTE — Progress Notes (Signed)
BP 142/92, denies any preeclmapsia symptoms.  Will check labs today.  Continue antenatal testing as per [facility].   On insulin pump, BS analysis done with help of Ulyses Jarred, RN, CDE (Diabetes education) and Melinda Crutch, RN ([employer])  No other complaints or concerns. Preeclampsia, fetal movement and labor precautions reviewed

## 2013-10-08 LAB — PROTEIN / CREATININE RATIO, URINE
Creatinine, Urine: 124 mg/dL
Protein Creatinine Ratio: 0.75 — ABNORMAL HIGH (ref <0.15)
TOTAL PROTEIN, URINE: 93 mg/dL

## 2013-10-10 ENCOUNTER — Encounter: Payer: Self-pay | Admitting: Obstetrics & Gynecology

## 2013-10-14 ENCOUNTER — Ambulatory Visit (INDEPENDENT_AMBULATORY_CARE_PROVIDER_SITE_OTHER): Payer: PRIVATE HEALTH INSURANCE | Admitting: Obstetrics & Gynecology

## 2013-10-14 ENCOUNTER — Ambulatory Visit (HOSPITAL_COMMUNITY)
Admission: RE | Admit: 2013-10-14 | Discharge: 2013-10-14 | Disposition: A | Discharge status: Home / Self Care | Payer: PRIVATE HEALTH INSURANCE | Source: Ambulatory Visit | Attending: Obstetrics & Gynecology | Admitting: Obstetrics & Gynecology

## 2013-10-14 VITALS — BP 137/91 | Wt 202.0 lb

## 2013-10-14 DIAGNOSIS — E1129 Type 2 diabetes mellitus with other diabetic kidney complication: Secondary | ICD-10-CM | POA: Insufficient documentation

## 2013-10-14 DIAGNOSIS — O24919 Unspecified diabetes mellitus in pregnancy, unspecified trimester: Secondary | ICD-10-CM | POA: Insufficient documentation

## 2013-10-14 DIAGNOSIS — IMO0002 Reserved for concepts with insufficient information to code with codable children: Secondary | ICD-10-CM

## 2013-10-14 DIAGNOSIS — O368390 Maternal care for abnormalities of the fetal heart rate or rhythm, unspecified trimester, not applicable or unspecified: Secondary | ICD-10-CM

## 2013-10-14 DIAGNOSIS — O24319 Unspecified pre-existing diabetes mellitus in pregnancy, unspecified trimester: Secondary | ICD-10-CM

## 2013-10-14 DIAGNOSIS — N058 Unspecified nephritic syndrome with other morphologic changes: Secondary | ICD-10-CM

## 2013-10-14 DIAGNOSIS — E119 Type 2 diabetes mellitus without complications: Secondary | ICD-10-CM

## 2013-10-14 DIAGNOSIS — E1029 Type 1 diabetes mellitus with other diabetic kidney complication: Secondary | ICD-10-CM

## 2013-10-14 DIAGNOSIS — E1021 Type 1 diabetes mellitus with diabetic nephropathy: Secondary | ICD-10-CM

## 2013-10-14 DIAGNOSIS — O1400 Mild to moderate pre-eclampsia, unspecified trimester: Secondary | ICD-10-CM

## 2013-10-14 DIAGNOSIS — O289 Unspecified abnormal findings on antenatal screening of mother: Secondary | ICD-10-CM | POA: Insufficient documentation

## 2013-10-14 LAB — POCT URINALYSIS DIP (DEVICE)
BILIRUBIN URINE: NEGATIVE
Glucose, UA: NEGATIVE mg/dL
Ketones, ur: NEGATIVE mg/dL
LEUKOCYTES UA: NEGATIVE
NITRITE: NEGATIVE
Protein, ur: 100 mg/dL — AB
Specific Gravity, Urine: >=1.03 (ref 1.005–1.030)
UROBILINOGEN UA: 0.2 mg/dL (ref 0.0–1.0)
pH: 5.5 (ref 5.0–8.0)

## 2013-10-14 NOTE — Progress Notes (Signed)
Diabetes: upload pump, reviewed by Melinda Crutch RN, CDE ([employer]) report scanned to chart by Danton Sewer. With recommendations noted.

## 2013-10-14 NOTE — Progress Notes (Signed)
Pulse: 93

## 2013-10-14 NOTE — Progress Notes (Signed)
BP 137/91, denies any preeclampsia symptoms. Meets criteria for preeclampsia with no severe features for now.  Labs on 10/07/13 showed urine Pr:Cr 0.75, normal CBC, normal CMET. If remains without severe features, delivery indicated at 37 weeks.  If severe features, may need delivery earlier.  NST is not reactive today, had prolonged variable deceleration,  will obtain BPP and continue 2x/week testing. On insulin pump, BS analysis done with help of Ulyses Jarred, RN, CDE (Diabetes education) and Melinda Crutch, RN ([employer])  No other complaints or concerns. Preeclampsia, fetal movement and labor precautions reviewed Addendum: BPP 8/8, AFI 18 cm. Return for NST later this week.

## 2013-10-14 NOTE — Patient Instructions (Signed)
Preeclampsia and Eclampsia Preeclampsia is a condition of high blood pressure during pregnancy. It can happen at 20 weeks or later in pregnancy. If high blood pressure occurs in the second half of pregnancy with no other symptoms, it is called gestational hypertension and goes away after the baby is born. If any of the symptoms listed below develop with gestational hypertension, it is then called preeclampsia. Eclampsia (convulsions) may follow preeclampsia. This is one of the reasons for regular prenatal checkups. Early diagnosis and treatment are very important to prevent eclampsia. CAUSES  There is no known cause of preeclampsia/eclampsia in pregnancy. There are several known conditions that may put the pregnant woman at risk, such as:  The first pregnancy.  Having preeclampsia in a past pregnancy.  Having lasting (chronic) high blood pressure.  Having multiples (twins, triplets).  Being age 35 or older.  African American ethnic background.  Having kidney disease or diabetes.  Medical conditions such as lupus or blood diseases.  Being overweight (obese). SYMPTOMS   High blood pressure.  Headaches.  Sudden weight gain.  Swelling of hands, face, legs, and feet.  Protein in the urine.  Feeling sick to your stomach (nauseous) and throwing up (vomiting).  Vision problems (blurred or double vision).  Numbness in the face, arms, legs, and feet.  Dizziness.  Slurred speech.  Preeclampsia can cause growth retardation in the fetus.  Separation (abruption) of the placenta.  Not enough fluid in the amniotic sac (oligohydramnios).  Sensitivity to bright lights.  Belly (abdominal) pain. DIAGNOSIS  If protein is found in the urine in the second half of pregnancy, this is considered preeclampsia. Other symptoms mentioned above may also be present. TREATMENT  It is necessary to treat this.  Your caregiver may prescribe bed rest early in this condition. Plenty of rest and  salt restriction may be all that is needed.  Medicines may be necessary to lower blood pressure if the condition does not respond to more conservative measures.  In more severe cases, hospitalization may be needed:  For treatment of blood pressure.  To control fluid retention.  To monitor the baby to see if the condition is causing harm to the baby.  Hospitalization is the best way to treat the first sign of preeclampsia. This is so the mother and baby can be watched closely and blood tests can be done effectively and correctly.  If the condition becomes severe, it may be necessary to induce labor or to remove the infant by surgical means (cesarean section). The best cure for preeclampsia/eclampsia is to deliver the baby. Preeclampsia and eclampsia involve risks to mother and infant. Your caregiver will discuss these risks with you. Together, you can work out the best possible approach to your problems. Make sure you keep your prenatal visits as scheduled. Not keeping appointments could result in a chronic or permanent injury, pain, disability to you, and death or injury to you or your unborn baby. If there is any problem keeping the appointment, you must call to reschedule. HOME CARE INSTRUCTIONS   Keep your prenatal appointments and tests as scheduled.  Tell your caregiver if you have any of the above risk factors.  Get plenty of rest and sleep.  Eat a balanced diet that is low in salt, and do not add salt to your food.  Avoid stressful situations.  Only take over-the-counter and prescriptions medicines for pain, discomfort, or fever as directed by your caregiver. SEEK IMMEDIATE MEDICAL CARE IF:   You develop severe swelling   anywhere in the body. This usually occurs in the legs.  You gain 05 lb/2.3 kg or more in a week.  You develop a severe headache, dizziness, problems with your vision, or confusion.  You have abdominal pain, nausea, or vomiting.  You have a seizure.  You  have trouble moving any part of your body, or you develop numbness or problems speaking.  You have bruising or abnormal bleeding from anywhere in the body.  You develop a stiff neck.  You pass out. MAKE SURE YOU:   Understand these instructions.  Will watch your condition.  Will get help right away if you are not doing well or get worse. Document Released: 08/12/2000 Document Revised: 11/07/2011 Document Reviewed: 03/28/2008 [employer] Patient Information 2014 [place].   Hypertension During Pregnancy Hypertension is also called high blood pressure. It can occur at any time in life and during pregnancy. When you have hypertension, there is extra pressure inside your blood vessels that carry blood from the heart to the rest of your body (arteries). Hypertension during pregnancy can cause problems for you and your baby. Your baby might not weigh as much as it should at birth or might be born early (premature). Very bad cases of hypertension during pregnancy can be life threatening.  Different types of hypertension can occur during pregnancy.   Chronic hypertension. This happens when a woman has hypertension before pregnancy and it continues during pregnancy.  Gestational hypertension. This is when hypertension develops during pregnancy.  Preeclampsia or toxemia of pregnancy. This is a very serious type of hypertension that develops only during pregnancy. It is a disease that affects the whole body (systemic) and can be very dangerous for both mother and baby.  Gestational hypertension and preeclampsia usually go away after your baby is born. Blood pressure generally stabilizes within 6 weeks. Women who have hypertension during pregnancy have a greater chance of developing hypertension later in life or with future pregnancies. RISK FACTORS Some factors make you more likely to develop hypertension during pregnancy. Risk factors include:  Having hypertension before  pregnancy.  Having hypertension during a previous pregnancy.  Being overweight.  Being older than 40.  Being pregnant with more than one baby (multiples).  Having diabetes or kidney problems. SIGNS AND SYMPTOMS Chronic and gestational hypertension rarely cause symptoms. Preeclampsia has symptoms, which may include:  Increased protein in your urine. Your health care provider will check for this at every prenatal visit.  Swelling of your hands and face.  Rapid weight gain.  Headaches.  Visual changes.  Being bothered by light.  Abdominal pain, especially in the right upper area.  Chest pain.  Shortness of breath.  Increased reflexes.  Seizures. Seizures occur with a more severe form of preeclampsia, called eclampsia. DIAGNOSIS   You may be diagnosed with hypertension during a regular prenatal exam. At each visit, tests may include:  Blood pressure checks.  A urine test to check for protein in your urine.  The type of hypertension you are diagnosed with depends on when you developed it. It also depends on your specific blood pressure reading.  Developing hypertension before 20 weeks of pregnancy is consistent with chronic hypertension.  Developing hypertension after 20 weeks of pregnancy is consistent with gestational hypertension.  Hypertension with increased urinary protein is diagnosed as preeclampsia.  Blood pressure measurements that stay above 0000000 systolic or [id number] diastolic are a sign of severe preeclampsia. TREATMENT Treatment for hypertension during pregnancy varies. Treatment depends on the type of hypertension  and how serious it is.  If you take medicine for chronic hypertension, you may need to switch medicines.  Drugs called ACE inhibitors should not be taken during pregnancy.  Low-dose aspirin may be suggested for women who have risk factors for preeclampsia.  If you have gestational hypertension, you may need to take a blood pressure medicine  that is safe during pregnancy. Your health care provider will recommend the appropriate medicine.  If you have severe preeclampsia, you may need to be in the hospital. Health care providers will watch you and the baby very closely. You also may need to take medicine (magnesium sulfate) to prevent seizures and lower blood pressure.  Sometimes an early delivery is needed. This may be the case if the condition worsens. It would be done to protect you and the baby. The only cure for preeclampsia is delivery. HOME CARE INSTRUCTIONS  Schedule and keep all of your regular appointments for prenatal care.  Only take over-the-counter or prescription medicines as directed by your health care provider. Tell your health care provider about all medicines you take.  Eat as little salt as possible.  Get regular exercise.  Do not drink alcohol.  Do not use tobacco products.  Do not drink products with caffeine.  Lie on your left side when resting. SEEK IMMEDIATE MEDICAL CARE IF:  You have severe abdominal pain.  You have sudden swelling in the hands, ankles, or face.  You gain 4 pounds (1.8 kg) or more in 1 week.  You vomit repeatedly.  You have vaginal bleeding.  You do not feel the baby moving as much.  You have a headache.  You have blurred or double vision.  You have muscle twitching or spasms.  You have shortness of breath.  You have blue fingernails and lips.  You have blood in your urine. MAKE SURE YOU:  Understand these instructions.  Will watch your condition.  Will get help right away if you are not doing well or get worse. Document Released: 05/03/2011 Document Revised: 06/05/2013 Document Reviewed: 03/14/2013 [facility] Patient Information 2014 [place], [place].

## 2013-10-15 ENCOUNTER — Encounter: Payer: Self-pay | Admitting: *Deleted

## 2013-10-17 ENCOUNTER — Other Ambulatory Visit (HOSPITAL_COMMUNITY): Payer: Self-pay | Admitting: Maternal and Fetal Medicine

## 2013-10-17 ENCOUNTER — Ambulatory Visit (INDEPENDENT_AMBULATORY_CARE_PROVIDER_SITE_OTHER): Payer: PRIVATE HEALTH INSURANCE | Admitting: *Deleted

## 2013-10-17 ENCOUNTER — Other Ambulatory Visit: Payer: PRIVATE HEALTH INSURANCE

## 2013-10-17 DIAGNOSIS — O1400 Mild to moderate pre-eclampsia, unspecified trimester: Secondary | ICD-10-CM

## 2013-10-17 DIAGNOSIS — E119 Type 2 diabetes mellitus without complications: Secondary | ICD-10-CM

## 2013-10-17 DIAGNOSIS — O24319 Unspecified pre-existing diabetes mellitus in pregnancy, unspecified trimester: Secondary | ICD-10-CM

## 2013-10-17 DIAGNOSIS — O24919 Unspecified diabetes mellitus in pregnancy, unspecified trimester: Secondary | ICD-10-CM

## 2013-10-17 DIAGNOSIS — E109 Type 1 diabetes mellitus without complications: Secondary | ICD-10-CM

## 2013-10-17 DIAGNOSIS — IMO0002 Reserved for concepts with insufficient information to code with codable children: Secondary | ICD-10-CM

## 2013-10-17 NOTE — Progress Notes (Signed)
Pt has [place] @ MFM on 2/23 for growth & BPP

## 2013-10-17 NOTE — Progress Notes (Signed)
NST performed today was reviewed and was found to be reactive.  Continue recommended antenatal testing and prenatal care.  

## 2013-10-21 ENCOUNTER — Encounter: Payer: Self-pay | Admitting: *Deleted

## 2013-10-21 ENCOUNTER — Other Ambulatory Visit: Payer: PRIVATE HEALTH INSURANCE

## 2013-10-21 ENCOUNTER — Inpatient Hospital Stay (HOSPITAL_COMMUNITY)
Admission: AD | Admit: 2013-10-21 | Discharge: 2013-10-25 | DRG: 774 | Disposition: A | Discharge status: Home / Self Care | Payer: PRIVATE HEALTH INSURANCE | Source: Ambulatory Visit | Attending: Obstetrics & Gynecology | Admitting: Obstetrics & Gynecology

## 2013-10-21 ENCOUNTER — Encounter (HOSPITAL_COMMUNITY): Payer: Self-pay | Admitting: *Deleted

## 2013-10-21 ENCOUNTER — Ambulatory Visit (HOSPITAL_COMMUNITY)
Admission: RE | Admit: 2013-10-21 | Discharge: 2013-10-21 | Disposition: A | Discharge status: Home / Self Care | Payer: PRIVATE HEALTH INSURANCE | Source: Ambulatory Visit | Attending: Maternal and Fetal Medicine | Admitting: Maternal and Fetal Medicine

## 2013-10-21 ENCOUNTER — Ambulatory Visit (HOSPITAL_COMMUNITY)
Admission: RE | Admit: 2013-10-21 | Discharge: 2013-10-21 | Disposition: A | Discharge status: Home / Self Care | Payer: PRIVATE HEALTH INSURANCE | Source: Ambulatory Visit | Attending: Family Medicine | Admitting: Family Medicine

## 2013-10-21 ENCOUNTER — Encounter: Payer: PRIVATE HEALTH INSURANCE | Admitting: Family Medicine

## 2013-10-21 DIAGNOSIS — O24319 Unspecified pre-existing diabetes mellitus in pregnancy, unspecified trimester: Secondary | ICD-10-CM

## 2013-10-21 DIAGNOSIS — O9902 Anemia complicating childbirth: Secondary | ICD-10-CM | POA: Diagnosis present

## 2013-10-21 DIAGNOSIS — O99892 Other specified diseases and conditions complicating childbirth: Secondary | ICD-10-CM | POA: Diagnosis present

## 2013-10-21 DIAGNOSIS — Z3490 Encounter for supervision of normal pregnancy, unspecified, unspecified trimester: Secondary | ICD-10-CM

## 2013-10-21 DIAGNOSIS — E109 Type 1 diabetes mellitus without complications: Secondary | ICD-10-CM | POA: Diagnosis present

## 2013-10-21 DIAGNOSIS — Z794 Long term (current) use of insulin: Secondary | ICD-10-CM

## 2013-10-21 DIAGNOSIS — D649 Anemia, unspecified: Secondary | ICD-10-CM | POA: Diagnosis present

## 2013-10-21 DIAGNOSIS — Z22330 Carrier of Group B streptococcus: Secondary | ICD-10-CM

## 2013-10-21 DIAGNOSIS — O9989 Other specified diseases and conditions complicating pregnancy, childbirth and the puerperium: Secondary | ICD-10-CM

## 2013-10-21 DIAGNOSIS — IMO0002 Reserved for concepts with insufficient information to code with codable children: Secondary | ICD-10-CM | POA: Diagnosis present

## 2013-10-21 DIAGNOSIS — O2432 Unspecified pre-existing diabetes mellitus in childbirth: Secondary | ICD-10-CM | POA: Diagnosis present

## 2013-10-21 DIAGNOSIS — O1400 Mild to moderate pre-eclampsia, unspecified trimester: Secondary | ICD-10-CM

## 2013-10-21 DIAGNOSIS — O1490 Unspecified pre-eclampsia, unspecified trimester: Secondary | ICD-10-CM | POA: Diagnosis present

## 2013-10-21 DIAGNOSIS — D573 Sickle-cell trait: Secondary | ICD-10-CM | POA: Diagnosis present

## 2013-10-21 LAB — COMPREHENSIVE METABOLIC PANEL
ALK PHOS: 119 U/L — AB (ref 39–117)
ALT: 12 U/L (ref 0–35)
AST: 27 U/L (ref 0–37)
Albumin: 2.4 g/dL — ABNORMAL LOW (ref 3.5–5.2)
BUN: 9 mg/dL (ref 6–23)
CO2: 23 meq/L (ref 19–32)
Calcium: 8.4 mg/dL (ref 8.4–10.5)
Chloride: 104 mEq/L (ref 96–112)
Creatinine, Ser: 0.56 mg/dL (ref 0.50–1.10)
GFR calc non Af Amer: >90 mL/min (ref >90)
Glucose, Bld: 53 mg/dL — ABNORMAL LOW (ref 70–99)
POTASSIUM: 4 meq/L (ref 3.7–5.3)
SODIUM: 137 meq/L (ref 137–147)
Total Bilirubin: <0.2 mg/dL — ABNORMAL LOW (ref 0.3–1.2)
Total Protein: 6.8 g/dL (ref 6.0–8.3)

## 2013-10-21 LAB — OB RESULTS CONSOLE GBS: STREP GROUP B AG: POSITIVE

## 2013-10-21 LAB — TYPE AND SCREEN
ABO/RH(D): A POS
Antibody Screen: NEGATIVE

## 2013-10-21 LAB — GLUCOSE, CAPILLARY
GLUCOSE-CAPILLARY: 129 mg/dL — AB (ref 70–99)
GLUCOSE-CAPILLARY: 132 mg/dL — AB (ref 70–99)
GLUCOSE-CAPILLARY: 179 mg/dL — AB (ref 70–99)
Glucose-Capillary: 102 mg/dL — ABNORMAL HIGH (ref 70–99)
Glucose-Capillary: 106 mg/dL — ABNORMAL HIGH (ref 70–99)
Glucose-Capillary: 193 mg/dL — ABNORMAL HIGH (ref 70–99)
Glucose-Capillary: 164 mg/dL — ABNORMAL HIGH (ref 70–99)
Glucose-Capillary: 85 mg/dL (ref 70–99)
Glucose-Capillary: 84 mg/dL (ref 70–99)
Glucose-Capillary: 77 mg/dL (ref 70–99)
Glucose-Capillary: 79 mg/dL (ref 70–99)

## 2013-10-21 LAB — PROTEIN / CREATININE RATIO, URINE
Creatinine, Urine: 194.6 mg/dL
Protein Creatinine Ratio: 1.72 — ABNORMAL HIGH (ref 0.00–0.15)
Total Protein, Urine: 335.1 mg/dL

## 2013-10-21 LAB — CBC
HCT: 36.1 % (ref 36.0–46.0)
HEMOGLOBIN: 12.7 g/dL (ref 12.0–15.0)
MCH: 25.9 pg — ABNORMAL LOW (ref 26.0–34.0)
MCHC: 35.2 g/dL (ref 30.0–36.0)
MCV: 73.7 fL — ABNORMAL LOW (ref 78.0–100.0)
PLATELETS: 174 10*3/uL (ref 150–400)
RBC: 4.9 MIL/uL (ref 3.87–5.11)
RDW: 13.3 % (ref 11.5–15.5)
WBC: 9.5 10*3/uL (ref 4.0–10.5)

## 2013-10-21 LAB — GROUP B STREP BY PCR: Group B strep by PCR: POSITIVE — AB

## 2013-10-21 LAB — RPR: RPR Ser Ql: NONREACTIVE

## 2013-10-21 LAB — ABO/RH: ABO/RH(D): A POS

## 2013-10-21 MED ORDER — MISOPROSTOL 25 MCG QUARTER TABLET
25.0000 ug | ORAL_TABLET | ORAL | Status: DC | PRN
Start: 2013-10-21 — End: 2013-10-23
  Administered 2013-10-21 (×2): 25 ug via VAGINAL
  Filled 2013-10-21: qty 1
  Filled 2013-10-21 (×3): qty 0.25

## 2013-10-21 MED ORDER — DEXTROSE IN LACTATED RINGERS 5 % IV SOLN
INTRAVENOUS | Status: DC
  Administered 2013-10-21 – 2013-10-23 (×5): via INTRAVENOUS

## 2013-10-21 MED ORDER — ACETAMINOPHEN 325 MG PO TABS: 650.0000 mg | ORAL_TABLET | ORAL | Status: DC | PRN

## 2013-10-21 MED ORDER — OXYTOCIN 40 UNITS IN LACTATED RINGERS INFUSION - SIMPLE MED: 62.5000 mL/h | INTRAVENOUS | Status: DC

## 2013-10-21 MED ORDER — MAGNESIUM SULFATE 40 G IN LACTATED RINGERS - SIMPLE
2.0000 g/h | INTRAVENOUS | Status: DC
  Administered 2013-10-21 – 2013-10-23 (×3): 2 g/h via INTRAVENOUS
  Filled 2013-10-21 (×4): qty 500

## 2013-10-21 MED ORDER — TERBUTALINE SULFATE 1 MG/ML IJ SOLN: 0.2500 mg | Freq: Once | INTRAMUSCULAR | Status: AC | PRN

## 2013-10-21 MED ORDER — CITRIC ACID-SODIUM CITRATE 334-500 MG/5ML PO SOLN: 30.0000 mL | ORAL | Status: DC | PRN

## 2013-10-21 MED ORDER — SODIUM CHLORIDE 0.9 % IV SOLN
INTRAVENOUS | Status: DC
  Administered 2013-10-21: 0.4 [IU]/h via INTRAVENOUS
  Administered 2013-10-22: 1.1 [IU]/h via INTRAVENOUS
  Administered 2013-10-22: 1.7 [IU]/h via INTRAVENOUS
  Administered 2013-10-23: 2.7 [IU]/h via INTRAVENOUS
  Filled 2013-10-21 (×2): qty 1

## 2013-10-21 MED ORDER — PENICILLIN G POTASSIUM 5000000 UNITS IJ SOLR
5.0000 10*6.[IU] | Freq: Once | INTRAVENOUS | Status: AC
  Administered 2013-10-21: 5 10*6.[IU] via INTRAVENOUS
  Filled 2013-10-21: qty 5

## 2013-10-21 MED ORDER — IBUPROFEN 600 MG PO TABS: 600.0000 mg | ORAL_TABLET | Freq: Four times a day (QID) | ORAL | Status: DC | PRN

## 2013-10-21 MED ORDER — LACTATED RINGERS IV SOLN
INTRAVENOUS | Status: DC
  Administered 2013-10-21: 13:00:00 via INTRAVENOUS

## 2013-10-21 MED ORDER — MAGNESIUM SULFATE BOLUS VIA INFUSION
4.0000 g | Freq: Once | INTRAVENOUS | Status: AC
  Administered 2013-10-21: 4 g via INTRAVENOUS
  Filled 2013-10-21: qty 500

## 2013-10-21 MED ORDER — LACTATED RINGERS IV SOLN: 500.0000 mL | INTRAVENOUS | Status: DC | PRN

## 2013-10-21 MED ORDER — LIDOCAINE HCL (PF) 1 % IJ SOLN
30.0000 mL | INTRAMUSCULAR | Status: DC | PRN
Start: 2013-10-21 — End: 2013-10-21

## 2013-10-21 MED ORDER — ONDANSETRON HCL 4 MG/2ML IJ SOLN: 4.0000 mg | Freq: Four times a day (QID) | INTRAMUSCULAR | Status: DC | PRN

## 2013-10-21 MED ORDER — OXYTOCIN 40 UNITS IN LACTATED RINGERS INFUSION - SIMPLE MED
62.5000 mL/h | INTRAVENOUS | Status: DC
  Administered 2013-10-23: 62.5 mL/h via INTRAVENOUS
  Filled 2013-10-21: qty 1000

## 2013-10-21 MED ORDER — LACTATED RINGERS IV SOLN
INTRAVENOUS | Status: DC
  Administered 2013-10-22 (×2): via INTRAVENOUS

## 2013-10-21 MED ORDER — LABETALOL HCL 5 MG/ML IV SOLN
20.0000 mg | INTRAVENOUS | Status: DC | PRN
  Administered 2013-10-21 – 2013-10-23 (×5): 20 mg via INTRAVENOUS
  Filled 2013-10-21 (×5): qty 4

## 2013-10-21 MED ORDER — LIDOCAINE HCL (PF) 1 % IJ SOLN
30.0000 mL | INTRAMUSCULAR | Status: AC | PRN
  Administered 2013-10-23 (×2): 5 mL via SUBCUTANEOUS

## 2013-10-21 MED ORDER — OXYCODONE-ACETAMINOPHEN 5-325 MG PO TABS: 1.0000 | ORAL_TABLET | ORAL | Status: DC | PRN

## 2013-10-21 MED ORDER — DEXTROSE 5 % IV SOLN
2.5000 10*6.[IU] | INTRAVENOUS | Status: DC
  Administered 2013-10-21 – 2013-10-23 (×12): 2.5 10*6.[IU] via INTRAVENOUS
  Filled 2013-10-21 (×16): qty 2.5

## 2013-10-21 MED ORDER — ONDANSETRON HCL 4 MG/2ML IJ SOLN
4.0000 mg | Freq: Four times a day (QID) | INTRAMUSCULAR | Status: DC | PRN
  Administered 2013-10-23: 4 mg via INTRAVENOUS
  Filled 2013-10-21: qty 2

## 2013-10-21 MED ORDER — OXYTOCIN BOLUS FROM INFUSION: 500.0000 mL | INTRAVENOUS | Status: DC

## 2013-10-21 NOTE — MAU Provider Note (Signed)
History     CSN: [id number]  Arrival date and time: 10/21/13 1011      Chief Complaint  Patient presents with  . NR NST, BPP 6/8, PIH    HPI  Kimberly [name] was transferred to MAU from MFM following a non-reactive NST, and a BPP of 6/8. Pr:Cr = 0.75 on 10/07/13. She is having elevated BP (145/100), MFM sent her to MAU for evaluation. Previous diagnosis of pre-eclampsia last week. She denies contractions, vaginal bleeding, LOF, and reports good fetal movement today. She denies dizziness, HA, N/V, fever, abdominal pain, and dysuria. She is DM with an insulin pump (novalog). Oral medications include prenatal vitamins. No history of abdominal surgeries.   OB History   Grav Para Term Preterm Abortions TAB SAB Ect Mult Living   1         0      Past Medical History  Diagnosis Date  . Diabetes mellitus without complication   . Sickle cell trait     Past Surgical History  Procedure Laterality Date  . Wisdom tooth extraction      Family History  Problem Relation Age of Onset  . Lupus Mother   . Aneurysm Mother     History  Substance Use Topics  . Smoking status: Never Smoker   . Smokeless tobacco: Never Used  . Alcohol Use: No    Allergies: No Known Allergies  Prescriptions prior to admission  Medication Sig Dispense Refill  . Blood Glucose Monitoring Suppl (CONTOUR NEXT EZ MONITOR) W/DEVICE KIT 1 each by Does not apply route 6 (six) times daily. Provide lancets for testing six times daily. Type I DM with diabetes on Insulin Pump  DX 648.03  1 kit  6  . glucose blood (BAYER CONTOUR NEXT TEST) test strip Use as instructed  100 each  12  . insulin aspart (NOVOLOG) 100 UNIT/ML injection Use as directed for insulin pump  3 vial  5  . Prenatal Vit-Fe Fumarate-FA (MULTIVITAMIN-PRENATAL) 27-0.8 MG TABS tablet Take 1 tablet by mouth daily at 12 noon.        Review of Systems  Constitutional: Negative for fever and chills.  Eyes: Negative for blurred vision and double vision.   Respiratory: Negative for shortness of breath and wheezing.   Cardiovascular: Positive for leg swelling. Negative for chest pain and palpitations.  Gastrointestinal: Negative for heartburn, nausea, vomiting, abdominal pain, diarrhea and constipation.  Genitourinary: Negative for dysuria, urgency and hematuria.  Neurological: Negative for dizziness, tingling, sensory change, weakness and headaches.   Physical Exam   Blood pressure 167/99, pulse 82, temperature 98 F (36.7 C), temperature source Oral, resp. rate 18, last menstrual period 03/28/2013.  Physical Exam  Constitutional: She is oriented to person, place, and time. She appears well-developed and well-nourished.  Eyes: EOM are normal.  Neck: No JVD present.  Cardiovascular: Normal rate and regular rhythm.   Respiratory: Effort normal and breath sounds normal.  GI: Soft. Bowel sounds are normal.  Musculoskeletal: She exhibits edema.  Mild pitting edema in LE bilaterally  Neurological: She is alert and oriented to person, place, and time.  Skin: Skin is warm and dry.  Psychiatric: She has a normal mood and affect.    FHM: baseline 130s, occasional accel, no decels or variables Ctx pattern: 4-5 min apart  MAU Course  Procedures  MDM   Assessment and Plan  1. Pregnancy 2. Pre-eclampsia with HTN in the severe range and elevated Pr:Cr 3. DM Type 1 - insulin pump  with novalog  Kimberly [name] is a 23 y.o. G1P0 at 13w4din MAU for observation for pre-eclampsia and DM complicating pregnancy.  Fetal monitor.    DMinette Brine2/23/2015, 10:37 AM    Evaluation and management procedures were performed by PA-s under my supervision/collaboration. Chart reviewed, patient examined by me and I agree with management and plan.  G1 at 34.4 wks EFM now reactive with baseline 125-130. UCs irregular, mild (BPP 8/10). Last growth scan 42%ile, normal AFV, symmetric on 10/14/13 Baseline BP 1st trimester 127/89 Baseline 24 hr protein 9549m  2/9/ P:C .75 Recent CBGs low and now 59> given juice (24 Gm carb) and pump off  Filed Vitals:   10/21/13 1027 10/21/13 1033 10/21/13 1037  BP: 158/102 152/109 167/99  Pulse: 84 92 82  Temp: 98 F (36.7 C)    TempSrc: Oral    Resp: 18

## 2013-10-21 NOTE — H&P (Signed)
Attestation of Attending Supervision of Advanced Practitioner (CNM/NP): Evaluation and management procedures were performed by the Advanced Practitioner under my supervision and collaboration.  I have reviewed the Advanced Practitioner's note and chart, and I agree with the management and plan.  Case was discussed with Dr. Nelda Severe (MFM attending) and decision was made to proceed to delivery secondary to preeclampsia with severe features  Kimberly [name] 10/21/2013 2:25 PM

## 2013-10-21 NOTE — Progress Notes (Signed)
CBG 61 per patient with her equipment. Diabetic RN at bedside with patient.

## 2013-10-21 NOTE — Progress Notes (Signed)
Kimberly [name] is a 23 y.o. G1P0 at [redacted]w[redacted]d  admitted for induction of labor due to Diabetes and Pre-eclamptic toxemia of pregnancy..  Subjective:   Objective: BP 154/95  Pulse 84  Temp(Src) 98.2 F (36.8 C) (Oral)  Resp 16  Ht 5\' 5"  (1.651 m)  Wt 94.802 kg (209 lb)  BMI 34.78 kg/m2  LMP 03/28/2013 I/O last 3 completed shifts: In: 1802.2 [P.O.:240; I.V.:1212.2; IV Piggyback:350] Out: 575 [Urine:575] Total I/O In: -  Out: 42 [Urine:725]  FHT:  FHR: 145 bpm, variability: moderate,  accelerations:  Abscent,  decelerations:  Present variables.  UC:   Irregular q 2-8 mins SVE:   Dilation: 1 Effacement (%): 70 Station: -2 Exam by:: Dr. Elly Modena  Labs: Lab Results  Component Value Date   WBC 9.5 10/21/2013   HGB 12.7 10/21/2013   HCT 36.1 10/21/2013   MCV 73.7* 10/21/2013   PLT 174 10/21/2013    Assessment / Plan: Induction of labor due to preeclampsia and gestational diabetes,  progressing well on pitocin  Labor: Progressing normally Preeclampsia:  on magnesium sulfate Fetal Wellbeing:  Category II Pain Control:  Labor support without medications I/D:  n/a Anticipated MOD:  NSVD  Will hold this dose of cytotec at this time. Consider foley bulb once FHR pattern looks better.  Mathis Bud 10/21/2013, 10:07 PM

## 2013-10-21 NOTE — MAU Provider Note (Signed)
Attestation of Attending Supervision of Advanced Practitioner (CNM/NP): Evaluation and management procedures were performed by the Advanced Practitioner under my supervision and collaboration.  I have reviewed the Advanced Practitioner's note and chart, and I agree with the management and plan.  Shalan Neault 10/21/2013 2:27 PM

## 2013-10-21 NOTE — H&P (Signed)
Kimberly [name] is a 23 y.o. female G1 at [redacted]w[redacted]d  presenting to MAU from MFM where she had NR-NST, BPP 6/10 (-2FBM) . PNC at [facility] due to: Type 1 DM with baseline proteinuria 956 mg, now on pump. CBGs low<70-80 over past week. GTN/PreE with baseline BP 127/89 in early pregnancy and P:C ratio .75 on 10/07/13.   Maternal Medical History:  Reason for admission: Nausea.  Fetal activity: Last perceived fetal movement was within the past hour.      OB History   Grav Para Term Preterm Abortions TAB SAB Ect Mult Living   1         0     Past Medical History  Diagnosis Date  . Diabetes mellitus without complication   . Sickle cell trait    Past Surgical History  Procedure Laterality Date  . Wisdom tooth extraction     Family History: family history includes Aneurysm in her mother; Lupus in her mother. Social History:  reports that she has never smoked. She has never used smokeless tobacco. She reports that she does not drink alcohol or use illicit drugs.   Prenatal Transfer Tool  Maternal Diabetes: Yes:  Diabetes Type:  Insulin/Medication controlled Genetic Screening: Normal Maternal Ultrasounds/Referrals: Normal 42nd%ile with nl AFV today Fetal Ultrasounds or other Referrals:  Fetal echo normal Maternal Substance Abuse:  No Significant Maternal Medications:  Meds include: Other: Glucose stabilizer, Magnesium sulfate Significant Maternal Lab Results:  Lab values include: Other: Rapid GBS pending Other Comments:  None  Review of Systems  Constitutional: Negative for fever and malaise/fatigue.  Eyes: Negative for blurred vision, double vision, photophobia and pain.  Respiratory: Negative for cough.   Cardiovascular: Positive for leg swelling. Negative for chest pain.  Gastrointestinal: Negative for nausea, vomiting and abdominal pain.  Genitourinary: Negative for dysuria.  Neurological: Negative for dizziness, weakness and headaches.  Psychiatric/Behavioral: Negative for depression  and substance abuse. The patient is not nervous/anxious.     Dilation: Closed Effacement (%): Thick Exam by:: D. Darold Miley, CNM Blood pressure 166/105, pulse 89, temperature 98 F (36.7 C), temperature source Oral, resp. rate 18, last menstrual period 03/28/2013. Cx soft post, ext FTP, head ballotable  Maternal Exam:  Uterine Assessment: Contraction strength is mild.  Contraction frequency is rare.   Abdomen: Estimated fetal weight is EFW 5#.   Fetal presentation: vertex  Introitus: Normal vulva. Ferning test: not done.  Nitrazine test: not done. Amniotic fluid character: not assessed.  Pelvis: adequate for delivery.   Cervix: Cervix evaluated by digital exam.     Fetal Exam Fetal Monitor Review: Mode: ultrasound.   Baseline rate: 125-130.  Variability: moderate (6-25 bpm).   Pattern: accelerations present and no decelerations.    Fetal State Assessment: Category I - tracings are normal.     Filed Vitals:   10/21/13 1042 10/21/13 1052 10/21/13 1102 10/21/13 1218  BP: 166/98 165/99 168/105 166/105  Pulse: 86 82 88 89  Temp:      TempSrc:      Resp:        Results for orders placed during the hospital encounter of 10/21/13 (from the past 24 hour(s))  CBC     Status: Abnormal   Collection Time    10/21/13 11:05 AM      Result Value Ref Range   WBC 9.5  4.0 - 10.5 K/uL   RBC 4.90  3.87 - 5.11 MIL/uL   Hemoglobin 12.7  12.0 - 15.0 g/dL   HCT 36.1  36.0 - 46.0 %   MCV 73.7 (*) 78.0 - 100.0 fL   MCH 25.9 (*) 26.0 - 34.0 pg   MCHC 35.2  30.0 - 36.0 g/dL   RDW 13.3  11.5 - 15.5 %   Platelets 174  150 - 400 K/uL  COMPREHENSIVE METABOLIC PANEL     Status: Abnormal   Collection Time    10/21/13 11:05 AM      Result Value Ref Range   Sodium 137  137 - 147 mEq/L   Potassium 4.0  3.7 - 5.3 mEq/L   Chloride 104  96 - 112 mEq/L   CO2 23  19 - 32 mEq/L   Glucose, Bld 53 (*) 70 - 99 mg/dL   BUN 9  6 - 23 mg/dL   Creatinine, Ser 0.56  0.50 - 1.10 mg/dL   Calcium 8.4  8.4 -  10.5 mg/dL   Total Protein 6.8  6.0 - 8.3 g/dL   Albumin 2.4 (*) 3.5 - 5.2 g/dL   AST 27  0 - 37 U/L   ALT 12  0 - 35 U/L   Alkaline Phosphatase 119 (*) 39 - 117 U/L   Total Bilirubin <0.2 (*) 0.3 - 1.2 mg/dL   GFR calc non Af Amer >90  >90 mL/min   GFR calc Af Amer >90  >90 mL/min   Physical Exam  Constitutional: She appears well-developed and well-nourished.  HENT:  Head: Normocephalic.  Eyes: Pupils are equal, round, and reactive to light.  Neck: Normal range of motion. Neck supple. No thyromegaly present.  Cardiovascular: Normal rate, regular rhythm and normal heart sounds.   Respiratory: Effort normal and breath sounds normal.  GI: There is no tenderness.  Genitourinary: Vagina normal.  Musculoskeletal: Normal range of motion. She exhibits edema.  2+ pretibial edema; DTRs 2+  Neurological: She has normal reflexes.  Skin: Skin is warm and dry.  Psychiatric: She has a normal mood and affect. Her behavior is normal. Judgment and thought content normal.    Prenatal labs: ABO, Rh: A/POS/-- (09/22 1224) Antibody: NEG (09/22 1224) Rubella: 0.42 (09/22 1224) RPR: NON REAC (01/05 0859)  HBsAg: NEGATIVE (09/22 1224)  HIV: NON REACTIVE (01/05 0859)  GBS:   Rapid GBS sent CBG 59 in MAU> juice (24 Gm) given and pump off  Assessment/Plan: C/WDr. Constant, severe range SBPs and hypoglycemic, FHR now reactive> She conferred with MFM and decision made to proceed with IOL Plan: Cytotec, rapid GBS, MgSO4, glucose stabilizer  Aldred Mase 10/21/2013, 12:21 PM

## 2013-10-21 NOTE — MAU Note (Signed)
Scheduled appointment at [facility]. Elevated BP, BPP 6/8. Sent to MAU for further eval.

## 2013-10-22 ENCOUNTER — Ambulatory Visit (HOSPITAL_COMMUNITY): Payer: PRIVATE HEALTH INSURANCE

## 2013-10-22 LAB — GLUCOSE, CAPILLARY
GLUCOSE-CAPILLARY: 102 mg/dL — AB (ref 70–99)
GLUCOSE-CAPILLARY: 127 mg/dL — AB (ref 70–99)
GLUCOSE-CAPILLARY: 164 mg/dL — AB (ref 70–99)
GLUCOSE-CAPILLARY: 89 mg/dL (ref 70–99)
GLUCOSE-CAPILLARY: 64 mg/dL — AB (ref 70–99)
GLUCOSE-CAPILLARY: 125 mg/dL — AB (ref 70–99)
GLUCOSE-CAPILLARY: 148 mg/dL — AB (ref 70–99)
GLUCOSE-CAPILLARY: 125 mg/dL — AB (ref 70–99)
GLUCOSE-CAPILLARY: 145 mg/dL — AB (ref 70–99)
GLUCOSE-CAPILLARY: 141 mg/dL — AB (ref 70–99)
Glucose-Capillary: 105 mg/dL — ABNORMAL HIGH (ref 70–99)
Glucose-Capillary: 106 mg/dL — ABNORMAL HIGH (ref 70–99)
Glucose-Capillary: 113 mg/dL — ABNORMAL HIGH (ref 70–99)
Glucose-Capillary: 116 mg/dL — ABNORMAL HIGH (ref 70–99)
Glucose-Capillary: 136 mg/dL — ABNORMAL HIGH (ref 70–99)
Glucose-Capillary: 149 mg/dL — ABNORMAL HIGH (ref 70–99)
Glucose-Capillary: 167 mg/dL — ABNORMAL HIGH (ref 70–99)
Glucose-Capillary: 55 mg/dL — ABNORMAL LOW (ref 70–99)
Glucose-Capillary: 71 mg/dL (ref 70–99)
Glucose-Capillary: 75 mg/dL (ref 70–99)
Glucose-Capillary: 79 mg/dL (ref 70–99)
Glucose-Capillary: 89 mg/dL (ref 70–99)
Glucose-Capillary: 90 mg/dL (ref 70–99)
Glucose-Capillary: 97 mg/dL (ref 70–99)

## 2013-10-22 MED ORDER — TERBUTALINE SULFATE 1 MG/ML IJ SOLN: 0.2500 mg | Freq: Once | INTRAMUSCULAR | Status: AC | PRN

## 2013-10-22 MED ORDER — OXYTOCIN 40 UNITS IN LACTATED RINGERS INFUSION - SIMPLE MED
1.0000 m[IU]/min | INTRAVENOUS | Status: DC
  Administered 2013-10-22: 10 m[IU]/min via INTRAVENOUS
  Administered 2013-10-22: 12 m[IU]/min via INTRAVENOUS
  Administered 2013-10-22: 14 m[IU]/min via INTRAVENOUS
  Administered 2013-10-22: 2 m[IU]/min via INTRAVENOUS
  Filled 2013-10-22: qty 1000

## 2013-10-22 NOTE — Progress Notes (Signed)
Kimberly [name] is a 23 y.o. G1P0 at [redacted]w[redacted]d  admitted for induction of labor due to Diabetes and Pre-eclamptic toxemia of pregnancy..  Subjective: No complaints, contractions mostly gone now.   Objective: BP 130/91  Pulse 89  Temp(Src) 98.1 F (36.7 C) (Oral)  Resp 16  Ht 5\' 5"  (1.651 m)  Wt 94.802 kg (209 lb)  BMI 34.78 kg/m2  LMP 03/28/2013 I/O last 3 completed shifts: In: 1802.2 [P.O.:240; I.V.:1212.2; IV Piggyback:350] Out: 575 [Urine:575] Total I/O In: 468.7 [P.O.:240; I.V.:128.7; IV Piggyback:100] Out: 1025 [Urine:1025]  FHT:  FHR: 130 bpm, variability: minimal ,  accelerations:  Abscent,  decelerations:  Absent UC:   [place] SVE:   Dilation: 1 Effacement (%): 70 Station: -2 Exam by:: Kenn File  Labs: Lab Results  Component Value Date   WBC 9.5 10/21/2013   HGB 12.7 10/21/2013   HCT 36.1 10/21/2013   MCV 73.7* 10/21/2013   PLT 174 10/21/2013    Assessment / Plan: Induction of labor due to preeclampsia and gestational diabetes  Labor: cytotec held for prolonged decels which ahve now resolved, foley bulb now placed.  Preeclampsia:  on magnesium sulfate Fetal Wellbeing:  Category II Pain Control:  Labor support without medications I/D:  n/a Anticipated MOD:  NSVD   Kenn File 10/22/2013, 12:23 AM

## 2013-10-22 NOTE — Progress Notes (Signed)
Kimberly [name] is a 23 y.o. G1P0 at [redacted]w[redacted]d  admitted for induction of labor due to Diabetes and Pre-eclamptic toxemia of pregnancy..  Subjective: No complaints, contractions mostly gone now. FB still in place.  Objective: BP 151/92  Pulse 87  Temp(Src) 97.5 F (36.4 C) (Oral)  Resp 20  Ht 5\' 5"  (1.651 m)  Wt 94.802 kg (209 lb)  BMI 34.78 kg/m2  LMP 03/28/2013 I/O last 3 completed shifts: In: [id number] [P.O.:480; I.V.:3191; IV Piggyback:650] Out: 2500 [Urine:2500] Total I/O In: 1842.9 [P.O.:360; I.V.:1382.9; IV Piggyback:100] Out: 1400 [id number]  Filed Vitals:   10/22/13 1014 10/22/13 1015 10/22/13 1108 10/22/13 1201  BP: 144/102 142/97  151/92  Pulse: 87 87  87  Temp:    97.5 F (36.4 C)  TempSrc:    Oral  Resp:   18 20  Height:      Weight:         FHT:  FHR: 120s bpm, variability: moderate,  accelerations:  Abscent,  decelerations:  Absent UC:   None  SVE:   Dilation: 1 Effacement (%): 70 Station: -2 Exam by:: Kenn File  Labs: Lab Results  Component Value Date   WBC 9.5 10/21/2013   HGB 12.7 10/21/2013   HCT 36.1 10/21/2013   MCV 73.7* 10/21/2013   PLT 174 10/21/2013    Assessment / Plan: Induction of labor due to preeclampsia and gestational diabetes  Labor:  foley bulb now placed. Start on Pit 2x2 up to 6 Preeclampsia:  on magnesium sulfate Fetal Wellbeing:  Cat I Pain Control:  Labor support without medications I/D:  GBS+ on PCN Anticipated MOD:  NSVD   Kimberly [name] 10/22/2013, 1:38 PM

## 2013-10-22 NOTE — Progress Notes (Signed)
Kimberly [name] is a 23 y.o. G1P0 at [redacted]w[redacted]d  admitted for induction of labor due to Diabetes and Pre-eclamptic toxemia of pregnancy..  Subjective: No complaints, contractions mostly gone now. FB now out Objective: BP 148/98  Pulse 89  Temp(Src) 97.6 F (36.4 C) (Oral)  Resp 20  Ht 5\' 5"  (1.651 m)  Wt 94.802 kg (209 lb)  BMI 34.78 kg/m2  LMP 03/28/2013 I/O last 3 completed shifts: In: [id number] [P.O.:480; I.V.:3191; IV Piggyback:650] Out: 2500 [Urine:2500] Total I/O In: 3360.4 [P.O.:840; I.V.:2320.4; IV Piggyback:200] Out: 2550 [Urine:2550]  Filed Vitals:   10/22/13 1605 10/22/13 1606 10/22/13 1709 10/22/13 1803  BP:  165/92 153/96 148/98  Pulse:  81 94 89  Temp:    97.6 F (36.4 C)  TempSrc:    Oral  Resp: 20  20 20   Height:      Weight:         FHT:  FHR: 120s bpm, variability: moderate,  accelerations:  Abscent,  decelerations:  Absent UC:   q4-71min on pit  SVE:   Dilation: 4 Effacement (%): 50 Station: -2 Exam by:: dr. Leslie Andrea  Labs: Lab Results  Component Value Date   WBC 9.5 10/21/2013   HGB 12.7 10/21/2013   HCT 36.1 10/21/2013   MCV 73.7* 10/21/2013   PLT 174 10/21/2013    Assessment / Plan: Induction of labor due to preeclampsia and gestational diabetes  Labor:  FB, continue pit for adequate ctx Preeclampsia:  on magnesium sulfate Fetal Wellbeing:  Cat I Pain Control:  Labor support without medications I/D:  GBS+ on PCN Anticipated MOD:  NSVD DM1F: cont glucostablaizer  Kimberly [name] RYAN 10/22/2013, 6:16 PM

## 2013-10-22 NOTE — Progress Notes (Signed)
10/22/13 1200  Clinical Encounter Type  Visited With Patient  Visit Type Initial;Spiritual support;Social support  Spiritual Encounters  Spiritual Needs Emotional;Prayer;Sacred text (Bible per pt request)  Stress Factors  Patient Stress Factors Health changes (preeclampsia-->induction/early delivery)   Kimberly [name] was in good spirits this morning, welcoming prayer for baby Raelen Quincy's safe arrival and for her health as she begins induction.  Introduced [facility] and chaplain availability throughout her stay (and baby's, if in NICU), offering emotional support and providing Bible per request.  She reports good support from boyfriend Aileen Pilot (spelling?), mom, dad, and MGM, all of whom plan to be here later today (weather permitting; roads are snowy).  Please page as further needs arise:  [phone].  Thank you.  [place], [place]

## 2013-10-22 NOTE — Progress Notes (Signed)
Kimberly [name] is a 23 y.o. G1P0 at [redacted]w[redacted]d  admitted for induction of labor due to Diabetes and Pre-eclamptic toxemia of pregnancy..  Subjective: No complaints, contractions mostly gone now.   Objective: BP 142/97  Pulse 87  Temp(Src) 97.9 F (36.6 C) (Oral)  Resp 18  Ht 5\' 5"  (1.651 m)  Wt 94.802 kg (209 lb)  BMI 34.78 kg/m2  LMP 03/28/2013 I/O last 3 completed shifts: In: [id number] [P.O.:480; I.V.:3191; IV Piggyback:650] Out: 2500 [Urine:2500] Total I/O In: 1378.7 [P.O.:360; I.V.:918.7; IV Piggyback:100] Out: 650 [Urine:650]  Filed Vitals:   10/22/13 1008 10/22/13 1014 10/22/13 1015 10/22/13 1108  BP:  144/102 142/97   Pulse:  87 87   Temp: 97.9 F (36.6 C)     TempSrc: Oral     Resp: 20   18  Height:      Weight:         FHT:  FHR: 130 bpm, variability: minimal ,  accelerations:  Abscent,  decelerations:  Absent UC:   None  SVE:   Dilation: 1 Effacement (%): 70 Station: -2 Exam by:: Kenn File  Labs: Lab Results  Component Value Date   WBC 9.5 10/21/2013   HGB 12.7 10/21/2013   HCT 36.1 10/21/2013   MCV 73.7* 10/21/2013   PLT 174 10/21/2013    Assessment / Plan: Induction of labor due to preeclampsia and gestational diabetes  Labor:  foley bulb now placed.  Preeclampsia:  on magnesium sulfate Fetal Wellbeing:  Category II Pain Control:  Labor support without medications I/D:  GBS+ on PCN Anticipated MOD:  NSVD   Davie Claud RYAN 10/22/2013, 11:52 AM

## 2013-10-22 NOTE — Progress Notes (Signed)
Added PRN labetalol to treatment for any severe range B/Ps  I have seen the patient with the resident/student and agree with the above.  Mathis Bud

## 2013-10-23 ENCOUNTER — Encounter (HOSPITAL_COMMUNITY): Payer: Self-pay | Admitting: *Deleted

## 2013-10-23 ENCOUNTER — Inpatient Hospital Stay (HOSPITAL_COMMUNITY): Payer: PRIVATE HEALTH INSURANCE | Admitting: Anesthesiology

## 2013-10-23 ENCOUNTER — Encounter (HOSPITAL_COMMUNITY): Payer: PRIVATE HEALTH INSURANCE | Admitting: Anesthesiology

## 2013-10-23 ENCOUNTER — Encounter: Payer: Self-pay | Admitting: *Deleted

## 2013-10-23 DIAGNOSIS — IMO0002 Reserved for concepts with insufficient information to code with codable children: Secondary | ICD-10-CM

## 2013-10-23 DIAGNOSIS — Z3490 Encounter for supervision of normal pregnancy, unspecified, unspecified trimester: Secondary | ICD-10-CM

## 2013-10-23 DIAGNOSIS — O2432 Unspecified pre-existing diabetes mellitus in childbirth: Secondary | ICD-10-CM

## 2013-10-23 LAB — GLUCOSE, CAPILLARY
GLUCOSE-CAPILLARY: 119 mg/dL — AB (ref 70–99)
GLUCOSE-CAPILLARY: 120 mg/dL — AB (ref 70–99)
GLUCOSE-CAPILLARY: 129 mg/dL — AB (ref 70–99)
GLUCOSE-CAPILLARY: 136 mg/dL — AB (ref 70–99)
GLUCOSE-CAPILLARY: 155 mg/dL — AB (ref 70–99)
GLUCOSE-CAPILLARY: 186 mg/dL — AB (ref 70–99)
GLUCOSE-CAPILLARY: 64 mg/dL — AB (ref 70–99)
GLUCOSE-CAPILLARY: 80 mg/dL (ref 70–99)
GLUCOSE-CAPILLARY: 81 mg/dL (ref 70–99)
GLUCOSE-CAPILLARY: 87 mg/dL (ref 70–99)
GLUCOSE-CAPILLARY: 91 mg/dL (ref 70–99)
GLUCOSE-CAPILLARY: 93 mg/dL (ref 70–99)
Glucose-Capillary: 88 mg/dL (ref 70–99)
Glucose-Capillary: 199 mg/dL — ABNORMAL HIGH (ref 70–99)
Glucose-Capillary: 152 mg/dL — ABNORMAL HIGH (ref 70–99)
Glucose-Capillary: 128 mg/dL — ABNORMAL HIGH (ref 70–99)
Glucose-Capillary: 91 mg/dL (ref 70–99)
Glucose-Capillary: 93 mg/dL (ref 70–99)
Glucose-Capillary: 107 mg/dL — ABNORMAL HIGH (ref 70–99)
Glucose-Capillary: 179 mg/dL — ABNORMAL HIGH (ref 70–99)
Glucose-Capillary: 186 mg/dL — ABNORMAL HIGH (ref 70–99)
Glucose-Capillary: 175 mg/dL — ABNORMAL HIGH (ref 70–99)
Glucose-Capillary: 167 mg/dL — ABNORMAL HIGH (ref 70–99)
Glucose-Capillary: 87 mg/dL (ref 70–99)

## 2013-10-23 LAB — CBC
HCT: 31.9 % — ABNORMAL LOW (ref 36.0–46.0)
HCT: 36.8 % (ref 36.0–46.0)
HEMOGLOBIN: 11.6 g/dL — AB (ref 12.0–15.0)
Hemoglobin: 13.4 g/dL (ref 12.0–15.0)
MCH: 26.7 pg (ref 26.0–34.0)
MCH: 26.7 pg (ref 26.0–34.0)
MCHC: 36.4 g/dL — AB (ref 30.0–36.0)
MCHC: 36.4 g/dL — AB (ref 30.0–36.0)
MCV: 73.5 fL — ABNORMAL LOW (ref 78.0–100.0)
MCV: 73.5 fL — ABNORMAL LOW (ref 78.0–100.0)
Platelets: 160 10*3/uL (ref 150–400)
Platelets: 164 10*3/uL (ref 150–400)
RBC: 4.34 MIL/uL (ref 3.87–5.11)
RBC: 5.01 MIL/uL (ref 3.87–5.11)
RDW: 13.3 % (ref 11.5–15.5)
RDW: 13.5 % (ref 11.5–15.5)
WBC: 12.8 10*3/uL — AB (ref 4.0–10.5)
WBC: 15.5 10*3/uL — ABNORMAL HIGH (ref 4.0–10.5)

## 2013-10-23 LAB — MRSA PCR SCREENING: MRSA by PCR: NEGATIVE

## 2013-10-23 MED ORDER — LANOLIN HYDROUS EX OINT: TOPICAL_OINTMENT | CUTANEOUS | Status: DC | PRN

## 2013-10-23 MED ORDER — EPHEDRINE 5 MG/ML INJ
10.0000 mg | INTRAVENOUS | Status: DC | PRN
  Filled 2013-10-23: qty 2

## 2013-10-23 MED ORDER — PHENYLEPHRINE 40 MCG/ML (10ML) SYRINGE FOR IV PUSH (FOR BLOOD PRESSURE SUPPORT)
80.0000 ug | PREFILLED_SYRINGE | INTRAVENOUS | Status: DC | PRN
  Filled 2013-10-23: qty 2

## 2013-10-23 MED ORDER — INSULIN ASPART 100 UNIT/ML ~~LOC~~ SOLN: 0.0000 [IU] | Freq: Three times a day (TID) | SUBCUTANEOUS | Status: DC

## 2013-10-23 MED ORDER — ONDANSETRON HCL 4 MG/2ML IJ SOLN: 4.0000 mg | INTRAMUSCULAR | Status: DC | PRN

## 2013-10-23 MED ORDER — BENZOCAINE-MENTHOL 20-0.5 % EX AERO
1.0000 "application " | INHALATION_SPRAY | CUTANEOUS | Status: DC | PRN
  Administered 2013-10-23: 1 via TOPICAL
  Filled 2013-10-23 (×2): qty 56

## 2013-10-23 MED ORDER — OXYCODONE-ACETAMINOPHEN 5-325 MG PO TABS: 1.0000 | ORAL_TABLET | ORAL | Status: DC | PRN

## 2013-10-23 MED ORDER — DIBUCAINE 1 % RE OINT: 1.0000 "application " | TOPICAL_OINTMENT | RECTAL | Status: DC | PRN

## 2013-10-23 MED ORDER — PHENYLEPHRINE 40 MCG/ML (10ML) SYRINGE FOR IV PUSH (FOR BLOOD PRESSURE SUPPORT)
80.0000 ug | PREFILLED_SYRINGE | INTRAVENOUS | Status: DC | PRN
  Filled 2013-10-23: qty 10
  Filled 2013-10-23: qty 2

## 2013-10-23 MED ORDER — LIDOCAINE HCL (PF) 1 % IJ SOLN
INTRAMUSCULAR | Status: AC
  Filled 2013-10-23: qty 30

## 2013-10-23 MED ORDER — EPHEDRINE 5 MG/ML INJ
10.0000 mg | INTRAVENOUS | Status: DC | PRN
  Filled 2013-10-23: qty 2
  Filled 2013-10-23: qty 4

## 2013-10-23 MED ORDER — MEASLES, MUMPS & RUBELLA VAC ~~LOC~~ INJ
0.5000 mL | INJECTION | Freq: Once | SUBCUTANEOUS | Status: AC
  Administered 2013-10-25: 0.5 mL via SUBCUTANEOUS
  Filled 2013-10-23: qty 0.5

## 2013-10-23 MED ORDER — MISOPROSTOL 200 MCG PO TABS
ORAL_TABLET | ORAL | Status: AC
  Filled 2013-10-23: qty 2

## 2013-10-23 MED ORDER — LACTATED RINGERS IV SOLN: 500.0000 mL | Freq: Once | INTRAVENOUS | Status: DC

## 2013-10-23 MED ORDER — LACTATED RINGERS IV SOLN
500.0000 mL | Freq: Once | INTRAVENOUS | Status: AC
  Administered 2013-10-23: 500 mL via INTRAVENOUS

## 2013-10-23 MED ORDER — SENNOSIDES-DOCUSATE SODIUM 8.6-50 MG PO TABS
2.0000 | ORAL_TABLET | ORAL | Status: DC
  Administered 2013-10-23 – 2013-10-24 (×2): 2 via ORAL
  Filled 2013-10-23 (×2): qty 2

## 2013-10-23 MED ORDER — WITCH HAZEL-GLYCERIN EX PADS: 1.0000 "application " | MEDICATED_PAD | CUTANEOUS | Status: DC | PRN

## 2013-10-23 MED ORDER — DIPHENHYDRAMINE HCL 50 MG/ML IJ SOLN: 12.5000 mg | INTRAMUSCULAR | Status: DC | PRN

## 2013-10-23 MED ORDER — DIPHENHYDRAMINE HCL 25 MG PO CAPS: 25.0000 mg | ORAL_CAPSULE | Freq: Four times a day (QID) | ORAL | Status: DC | PRN

## 2013-10-23 MED ORDER — FENTANYL 2.5 MCG/ML BUPIVACAINE 1/10 % EPIDURAL INFUSION (WH - ANES)
14.0000 mL/h | INTRAMUSCULAR | Status: DC | PRN
  Administered 2013-10-23: 14 mL/h via EPIDURAL
  Filled 2013-10-23 (×2): qty 125

## 2013-10-23 MED ORDER — MISOPROSTOL 200 MCG PO TABS
ORAL_TABLET | ORAL | Status: AC
  Filled 2013-10-23: qty 1

## 2013-10-23 MED ORDER — TETANUS-DIPHTH-ACELL PERTUSSIS 5-2.5-18.5 LF-MCG/0.5 IM SUSP
0.5000 mL | Freq: Once | INTRAMUSCULAR | Status: DC
  Filled 2013-10-23: qty 0.5

## 2013-10-23 MED ORDER — SIMETHICONE 80 MG PO CHEW: 80.0000 mg | CHEWABLE_TABLET | ORAL | Status: DC | PRN

## 2013-10-23 MED ORDER — FENTANYL CITRATE 0.05 MG/ML IJ SOLN
100.0000 ug | INTRAMUSCULAR | Status: DC | PRN
  Administered 2013-10-23 (×2): 100 ug via INTRAVENOUS
  Filled 2013-10-23 (×2): qty 2

## 2013-10-23 MED ORDER — PRENATAL MULTIVITAMIN CH
1.0000 | ORAL_TABLET | Freq: Every day | ORAL | Status: DC
  Administered 2013-10-24 – 2013-10-25 (×2): 1 via ORAL
  Filled 2013-10-23 (×2): qty 1

## 2013-10-23 MED ORDER — ZOLPIDEM TARTRATE 5 MG PO TABS: 5.0000 mg | ORAL_TABLET | Freq: Every evening | ORAL | Status: DC | PRN

## 2013-10-23 MED ORDER — ONDANSETRON HCL 4 MG PO TABS: 4.0000 mg | ORAL_TABLET | ORAL | Status: DC | PRN

## 2013-10-23 MED ORDER — MEASLES, MUMPS & RUBELLA VAC ~~LOC~~ INJ
0.5000 mL | INJECTION | Freq: Once | SUBCUTANEOUS | Status: DC
  Filled 2013-10-23: qty 0.5

## 2013-10-23 MED ORDER — IBUPROFEN 600 MG PO TABS
600.0000 mg | ORAL_TABLET | Freq: Four times a day (QID) | ORAL | Status: DC
  Administered 2013-10-23 – 2013-10-25 (×7): 600 mg via ORAL
  Filled 2013-10-23 (×7): qty 1

## 2013-10-23 MED ORDER — FENTANYL 2.5 MCG/ML BUPIVACAINE 1/10 % EPIDURAL INFUSION (WH - ANES)
14.0000 mL/h | INTRAMUSCULAR | Status: DC | PRN
  Administered 2013-10-23: 14 mL/h via EPIDURAL

## 2013-10-23 NOTE — Progress Notes (Signed)
Kimberly [name] is a 23 y.o. G1P0 at [redacted]w[redacted]d.  Subjective: Cramping, increased pressure.   Objective: BP 160/93  Pulse 74  Temp(Src) 97.9 F (36.6 C) (Oral)  Resp 20  Ht 5\' 5"  (1.651 m)  Wt 94.802 kg (209 lb)  BMI 34.78 kg/m2  LMP 03/28/2013 I/O last 3 completed shifts: In: 7933.4 [P.O.:1320; I.V.:5663.4; IV Piggyback:950] Out: [id number] [Urine:5450] Total I/O In: 478.9 [I.V.:478.9] Out: 1800 [Urine:1800]  FHT:  FHR: 120 bpm, variability: minimal,  accelerations:  absent,  decelerations: repetative lates from 4:08 to 4:20, Now having one as well but none in the interim UC:   irregular, every 2-5 minutes, mild. Pitocin on 18 SVE:   Dilation: 4 Effacement (%): 60 Station: -1 Exam by:: L.Stubbs, RN  Labs: Lab Results  Component Value Date   WBC 9.5 10/21/2013   HGB 12.7 10/21/2013   HCT 36.1 10/21/2013   MCV 73.7* 10/21/2013   PLT 174 10/21/2013    Assessment / Plan: Induction of labor due to preeclampsia,  With protracted latent phase  Labor: protracted latent phase , continue pit, dose reduced after previous late decels,  Preeclampsia:  on magnesium sulfate and no signs or symptoms of toxicity Fetal Wellbeing:  Category II Pain Control:  Fentanyl I/D:  n/a Anticipated MOD:  NSVD Increase pitocin.   Kenn File 10/23/2013, 5:43 AM

## 2013-10-23 NOTE — Progress Notes (Signed)
Kimberly [name] is a 23 y.o. G1P0 at [redacted]w[redacted]d.  Subjective: Very uncomfortable w/ UC's. Requesting pain meds.   Objective: BP 160/93  Pulse 74  Temp(Src) 97.9 F (36.6 C) (Oral)  Resp 20  Ht 5\' 5"  (1.651 m)  Wt 94.802 kg (209 lb)  BMI 34.78 kg/m2  LMP 03/28/2013 I/O last 3 completed shifts: In: 7933.4 [P.O.:1320; I.V.:5663.4; IV Piggyback:950] Out: [id number] [Urine:5450] Total I/O In: 531.7 [I.V.:531.7] Out: 1800 [Urine:1800]  FHT:  FHR: 120 bpm, variability: minimal ,  accelerations:  Present,  decelerations:  Present: intermittent lates. Improved w/ position changes.  UC:   irregular, every 2-5 minutes, moderate SVE:   Dilation: 5 Effacement (%): 80 Station: -1-2 Exam by:: Marlou Porch, CNM  Labs: Lab Results  Component Value Date   WBC 9.5 10/21/2013   HGB 12.7 10/21/2013   HCT 36.1 10/21/2013   MCV 73.7* 10/21/2013   PLT 174 10/21/2013    Assessment / Plan: Induction of labor due to gestational diabetes,  progressing well on pitocin  Labor: Progressing on Pitocin, will continue to increase then AROM Preeclampsia:  I&O +1100 Fetal Wellbeing:  Category II Pain Control:  May have epidural I/D:  n/a Anticipated MOD:  NSVD  Elizabethanne Lusher 10/23/2013, 6:18 AM

## 2013-10-23 NOTE — Anesthesia Preprocedure Evaluation (Signed)
Anesthesia Evaluation  Patient identified by MRN, date of birth, ID band Patient awake    Reviewed: Allergy & Precautions, H&P , NPO status , Patient's Chart, lab work & pertinent test results  History of Anesthesia Complications Negative for: history of anesthetic complications  Airway Mallampati: III TM Distance: >3 FB Neck ROM: Full    Dental  (+) Teeth Intact   Pulmonary neg pulmonary ROS,    Pulmonary exam normal       Cardiovascular hypertension, Pt. on medications Rhythm:Regular Rate:Normal  Pre-eclampsia with normal plts and no symptoms   Neuro/Psych negative neurological ROS  negative psych ROS   GI/Hepatic negative GI ROS, Neg liver ROS,   Endo/Other  diabetes, Type 1, Insulin Dependent  Renal/GU negative Renal ROS  negative genitourinary   Musculoskeletal negative musculoskeletal ROS (+)   Abdominal   Peds  Hematology  (+) anemia ,   Anesthesia Other Findings   Reproductive/Obstetrics (+) Pregnancy                           Anesthesia Physical Anesthesia Plan  ASA: III  Anesthesia Plan: Epidural   Post-op Pain Management:    Induction:   Airway Management Planned: Natural Airway  Additional Equipment: None  Intra-op Plan:   Post-operative Plan:   Informed Consent: I have reviewed the patients History and Physical, chart, labs and discussed the procedure including the risks, benefits and alternatives for the proposed anesthesia with the patient or authorized representative who has indicated his/her understanding and acceptance.   Dental advisory given  Plan Discussed with: Anesthesiologist  Anesthesia Plan Comments:         Anesthesia Quick Evaluation

## 2013-10-23 NOTE — Progress Notes (Signed)
Kimberly [name] is a 23 y.o. G1P0 at [redacted]w[redacted]d.  Subjective: Cramping, increased pressure.   Objective: BP 158/98  Pulse 90  Temp(Src) 97.9 F (36.6 C) (Oral)  Resp 16  Ht 5\' 5"  (1.651 m)  Wt 94.802 kg (209 lb)  BMI 34.78 kg/m2  LMP 03/28/2013 I/O last 3 completed shifts: In: 7933.4 [P.O.:1320; I.V.:5663.4; IV Piggyback:950] Out: [id number] [Urine:5450] Total I/O In: 209.6 [I.V.:209.6] Out: 825 [Urine:825]  FHT:  FHR: 125 bpm, variability: min-moderate,  accelerations:  Present,  decelerations:  Variable x 1 UC:   irregular, every 2-5 minutes, mild. Pitocin on 16 SVE:   Dilation: 4 Effacement (%): 60 Station: -1 Exam by:: V.Jessaca Philippi, CNM  Labs: Lab Results  Component Value Date   WBC 9.5 10/21/2013   HGB 12.7 10/21/2013   HCT 36.1 10/21/2013   MCV 73.7* 10/21/2013   PLT 174 10/21/2013    Assessment / Plan: Induction of labor due to preeclampsia,  progressing well on pitocin  Labor: Progressing on Pitocin, will continue to increase then AROM Preeclampsia:  on magnesium sulfate and no signs or symptoms of toxicity Fetal Wellbeing:  Category I and Category II Pain Control:  Labor support without medications I/D:  n/a Anticipated MOD:  NSVD Increase pitocin.   Kimberly [name] 10/23/2013, 1:42 AM

## 2013-10-23 NOTE — Progress Notes (Signed)
`````  Attestation of Attending Supervision of Advanced Practitioner: Evaluation and management procedures were performed by the PA/NP/CNM/OB Fellow under my supervision/collaboration. Chart reviewed and agree with management and plan.  Jonnie Kind 10/23/2013 7:47 PM

## 2013-10-23 NOTE — Anesthesia Procedure Notes (Signed)
Epidural Patient location during procedure: OB Start time: 10/23/2013 8:11 AM End time: 10/23/2013 8:26 AM  Staffing Anesthesiologist: Jadarian Mckay, CHRIS Performed by: anesthesiologist   Preanesthetic Checklist Completed: patient identified, surgical consent, pre-op evaluation, timeout performed, IV checked, risks and benefits discussed and monitors and equipment checked  Epidural Patient position: sitting Prep: site prepped and draped and DuraPrep Patient monitoring: heart rate, cardiac monitor, continuous pulse ox and blood pressure Approach: midline Location: L3-L4 Injection technique: LOR saline  Needle:  Needle type: Tuohy  Needle gauge: 17 G Needle length: 9 cm Needle insertion depth: 8 cm Catheter type: closed end flexible Catheter size: 19 Gauge Catheter at skin depth: 14 cm Test dose: Other  Assessment Events: blood not aspirated, injection not painful, no injection resistance, negative IV test and no paresthesia  Additional Notes H+P and labs checked, risks and benefits discussed with the patient, consent obtained, procedure tolerated well and without complications.  Reason for block:procedure for pain

## 2013-10-23 NOTE — Progress Notes (Signed)
Kimberly [name] is a 23 y.o. G1P0 at [redacted]w[redacted]d.  Subjective: Mild cramping.  Objective:  Temp:  [97.5 F (36.4 C)-98.3 F (36.8 C)] 97.9 F (36.6 C) (02/25 0001) Pulse Rate:  [79-94] 85 (02/25 0001) Resp:  [16-20] 16 (02/25 0001) BP: (134-171)/(86-102) 161/97 mmHg (02/25 0001)  I/O last 3 completed shifts: In: 7933.4 [P.O.:1320; I.V.:5663.4; IV Piggyback:950] Out: [id number] [Urine:5450] Total I/O In: 161.3 [I.V.:161.3] Out: 825 [Urine:825]  2 doses labetalol this evening  FHT:  FHR: 125 bpm, variability: min-moderate,  accelerations:  Present,  decelerations:  Few mild variables UC:   irregular, every 2-7 minutes, mild SVE:   Dilation: 4 Effacement (%): 50 Station: -2 Exam by:: Kimberly [name], CNM AROM moderate amount of clear fluid.  Labs: Lab Results  Component Value Date   WBC 9.5 10/21/2013   HGB 12.7 10/21/2013   HCT 36.1 10/21/2013   MCV 73.7* 10/21/2013   PLT 174 10/21/2013  CBGs 89-145  Assessment / Plan: Induction of labor due to preeclampsia,  progressing well on pitocin Few out-of-range BP's.    Labor: Progressing normally Preeclampsia:  on magnesium sulfate, no signs or symptoms of toxicity and labs stable Fetal Wellbeing:  Category I and Category II Pain Control:  Labor support without medications I/D:  n/a Anticipated MOD:  NSVD RN encouraged to increase pitocin on schedule.  May discuss starting PO Labetalol if BPs consistently stay out of range.   Kimberly [name] 10/23/2013, 12:07 AM

## 2013-10-23 NOTE — Progress Notes (Signed)
Patient will be converted postpartum back to her own insulin pump, with basal dosing halved from what she was on late in pregnancy. Basal rates of 0.80- 1.0 unit/hr will be halved over the same time ranges, and CHO adjustment dosing switched to 1u: 20g CHO, Sensitivity set at 75% and Target blood glucose levels adjusted up to 120/120. Pt will have partner go retrieve infusion tubing from home, and 2 hours after initiating insulin pump, we will d/c Glucose stabilizer.

## 2013-10-23 NOTE — Progress Notes (Signed)
Kimberly [name] is a 23 y.o. G1P0 at [redacted]w[redacted]d  admitted for induction of labor due to Pre-eclamptic toxemia of pregnancy..  Subjective:  Pt doing well. Comfortable.   Objective: BP 125/73  Pulse 82  Temp(Src) 98.1 F (36.7 C) (Oral)  Resp 18  Ht 5\' 5"  (1.651 m)  Wt 94.802 kg (209 lb)  BMI 34.78 kg/m2  SpO2 100%  LMP 03/28/2013 I/O last 3 completed shifts: In: 7016.6 [P.O.:1080; I.V.:5036.6; IV Piggyback:900] Out: 7125 [Urine:7125] Total I/O In: 3008.2 [I.V.:2908.2; IV Piggyback:100] Out: 350 [Urine:350]  FHT:  FHR: 120 bpm, variability: minimal to moderate,  accelerations:  Present,  decelerations:  Absent UC:   regular, every 3-4 minutes SVE:   Dilation: 5 Effacement (%): 80 Station: -2;-1 Exam by:: Manya Silvas, CNM  Labs: Lab Results  Component Value Date   WBC 12.8* 10/23/2013   HGB 11.6* 10/23/2013   HCT 31.9* 10/23/2013   MCV 73.5* 10/23/2013   PLT 160 10/23/2013    Assessment / Plan: IOl progressing slowly.  Cervix checked by [facility]raway-Smith and 5/50/-2  Labor: IUPC and FSE placed without difficulty around 10am.  Doing well. MVU just now adequate Preeclampsia:  on magnesium sulfate, no signs or symptoms of toxicity, intake and ouput balanced and labs stable Fetal Wellbeing:  Category II Pain Control:  Epidural I/D:  gbs + on PCN Anticipated MOD:  NSVD although discussed c/s as an option for fetal intolerance to labor and if cervix does not continue to change.  Leeanna Slaby L 10/23/2013, 12:59 PM

## 2013-10-24 ENCOUNTER — Other Ambulatory Visit: Payer: PRIVATE HEALTH INSURANCE

## 2013-10-24 LAB — GLUCOSE, CAPILLARY
GLUCOSE-CAPILLARY: 119 mg/dL — AB (ref 70–99)
GLUCOSE-CAPILLARY: 148 mg/dL — AB (ref 70–99)
GLUCOSE-CAPILLARY: 162 mg/dL — AB (ref 70–99)
GLUCOSE-CAPILLARY: 143 mg/dL — AB (ref 70–99)
Glucose-Capillary: 145 mg/dL — ABNORMAL HIGH (ref 70–99)
Glucose-Capillary: 152 mg/dL — ABNORMAL HIGH (ref 70–99)
Glucose-Capillary: 38 mg/dL — CL (ref 70–99)
Glucose-Capillary: 42 mg/dL — CL (ref 70–99)
Glucose-Capillary: 71 mg/dL (ref 70–99)

## 2013-10-24 MED ORDER — INSULIN PUMP
Freq: Three times a day (TID) | SUBCUTANEOUS | Status: DC
  Administered 2013-10-24: 1.1 via SUBCUTANEOUS
  Administered 2013-10-24: 0.8 via SUBCUTANEOUS
  Administered 2013-10-24: 0.5 via SUBCUTANEOUS
  Administered 2013-10-24: 0.75 via SUBCUTANEOUS
  Administered 2013-10-24: 1.8 via SUBCUTANEOUS
  Administered 2013-10-24 (×2): 0.7 via SUBCUTANEOUS
  Administered 2013-10-25: 1 via SUBCUTANEOUS
  Filled 2013-10-24: qty 1

## 2013-10-24 MED ORDER — SODIUM CHLORIDE 0.9 % IJ SOLN
3.0000 mL | Freq: Two times a day (BID) | INTRAMUSCULAR | Status: DC
  Administered 2013-10-24 (×2): 3 mL via INTRAVENOUS

## 2013-10-24 MED ORDER — SODIUM CHLORIDE 0.9 % IJ SOLN: 3.0000 mL | INTRAMUSCULAR | Status: DC | PRN

## 2013-10-24 MED ORDER — LACTATED RINGERS IV SOLN
INTRAVENOUS | Status: AC
  Administered 2013-10-24: 100 mL via INTRAVENOUS

## 2013-10-24 MED ORDER — MAGNESIUM SULFATE 40 G IN LACTATED RINGERS - SIMPLE
2.0000 g/h | INTRAVENOUS | Status: AC
  Administered 2013-10-24: 2 g/h via INTRAVENOUS
  Filled 2013-10-24: qty 500

## 2013-10-24 NOTE — Lactation Note (Signed)
This note was copied from the chart of Kimberly [name]. Lactation Consultation Note  Breastfeeding consultation services and support information given to patient.  Providing Breastmilk for Your Baby in NICU booklet given.  Mom is currently pumping.  Not obtaining colostrum yet.  Mom reassured and taught hand expression.  Reviewed pumping schedule, pump use and cleaning.  Encouraged to call with any concerns.  She has a medela DEBP at home.  Patient Name: Kimberly Johneshia Ark [id number] Date: 10/24/2013 Reason for consult: Initial assessment   Maternal Data Formula Feeding for Exclusion: Yes Reason for exclusion: Admission to Intensive Care Unit (ICU) post-partum Infant to breast within first hour of birth: No Breastfeeding delayed due to:: Infant status Has patient been taught Hand Expression?: Yes Does the patient have breastfeeding experience prior to this delivery?: No  Feeding Feeding Type: Formula Length of feed: 30 min  LATCH Score/Interventions                      Lactation Tools Discussed/Used Pump Review: Setup, frequency, and cleaning;Milk Storage Initiated by:: AICU RN Date initiated:: 10/23/13   Consult Status Consult Status: Follow-up Date: 10/25/13 Follow-up type: In-patient    Franki Monte 10/24/2013, 10:08 AM

## 2013-10-24 NOTE — Anesthesia Postprocedure Evaluation (Signed)
  Anesthesia Post-op Note  Patient: Kimberly [name]  Procedure(s) Performed: * No procedures listed *  Patient Location: PACU and A-ICU  Anesthesia Type:Epidural  Level of Consciousness: awake, alert , oriented and patient cooperative  Airway and Oxygen Therapy: Patient Spontanous Breathing  Post-op Pain: none  Post-op Assessment: Post-op Vital signs reviewed, Patient's Cardiovascular Status Stable and Respiratory Function Stable  Post-op Vital Signs: Reviewed and stable  Complications: No apparent anesthesia complications

## 2013-10-24 NOTE — Progress Notes (Signed)
CBG 145.  Patient insulin pump dosed .9 units

## 2013-10-24 NOTE — Progress Notes (Signed)
CBG 42.  15gms CHD given

## 2013-10-24 NOTE — Progress Notes (Signed)
CBG 71.  Patient stated feels much better

## 2013-10-24 NOTE — Progress Notes (Signed)
CBG 38, 15 gms CHD repeated per pt's request

## 2013-10-24 NOTE — Progress Notes (Signed)
Post Partum Day 1 Subjective: no complaints and tolerating PO  Objective: Blood pressure 155/90, pulse 91, temperature 98.2 F (36.8 C), temperature source Oral, resp. rate 18, height 5\' 5"  (1.651 m), weight 214 lb 0.4 oz (97.081 kg), last menstrual period 03/28/2013, SpO2 100.00%, unknown if currently breastfeeding.  Physical Exam:  General: alert, cooperative and no distress Lochia: appropriate Uterine Fundus: firm Incision: n/a DVT Evaluation: No evidence of DVT seen on physical exam. 2+ leg edema bilateral  Recent Labs  10/23/13 0640 10/23/13 1830  HGB 11.6* 13.4  HCT 31.9* 36.8    Assessment/Plan: PPD 1 preeclampsia, progressing well, follow BP, treat if severe   LOS: 3 days   Kimberly [name] 10/24/2013, 10:07 AM

## 2013-10-25 LAB — GLUCOSE, CAPILLARY: Glucose-Capillary: 60 mg/dL — ABNORMAL LOW (ref 70–99)

## 2013-10-25 MED ORDER — LISINOPRIL 5 MG PO TABS: 5.0000 mg | ORAL_TABLET | Freq: Every day | ORAL | Status: DC

## 2013-10-25 MED ORDER — LISINOPRIL 5 MG PO TABS
5.0000 mg | ORAL_TABLET | Freq: Every day | ORAL | Status: DC
  Administered 2013-10-25: 5 mg via ORAL
  Filled 2013-10-25 (×2): qty 1

## 2013-10-25 NOTE — Discharge Summary (Signed)
Obstetric Discharge Summary Reason for Admission: induction of labor for preeclampsia Prenatal Procedures: NST and ultrasound Intrapartum Procedures: vacuum Postpartum Procedures: none Complications-Operative and Postpartum: none Hemoglobin  Date Value Ref Range Status  10/23/2013 13.4  12.0 - 15.0 g/dL Final     HCT  Date Value Ref Range Status  10/23/2013 36.8  36.0 - 46.0 % Final    Filed Vitals:   10/24/13 1935 10/24/13 2107 10/25/13 0150 10/25/13 0558  BP: 147/92 155/89 150/87 162/96  Pulse: 86 81 81 80  Temp: 98.4 F (36.9 C) 98.5 F (36.9 C) 98.1 F (36.7 C) 98.1 F (36.7 C)  TempSrc: Oral Oral Oral Oral  Resp: 20 20  18   Height:      Weight:    94.348 kg (208 lb)  SpO2: 98% 98% 100% 100%     Physical Exam:  General: alert, cooperative and no distress Lochia: appropriate Uterine Fundus: firm Incision: n/a DVT Evaluation: No evidence of DVT seen on physical exam.  Discharge Diagnoses: Term Pregnancy-delivered Preeclampsia with lingering hypertension.  Will start on Lisinopril 10mg  today.  Plan discharge this afternoon after adequate response.    Discharge Information: Date: 10/25/2013 Activity: pelvic rest Diet: routine Medications: PNV and Ibuprofen Condition: stable Instructions: refer to practice specific booklet Discharge to: home Follow-up Information   Follow up with [facility]. Schedule an appointment as soon as possible for a visit in 3 days. (blood pressure check)    Contact information:   [place] [address]       Newborn Data: Live born female  Birth Weight: 5 lb 3.6 oz (2370 g) APGAR: 6, 8  Home with mother.  Kimberly [name],Kimberly [name] 10/25/2013, 7:59 AM

## 2013-10-25 NOTE — Progress Notes (Signed)
Pt is discharged in the care of friend. Downstairs with N.T. Escort. Denies pain  Or discomfort. Infant to remain  In [place]. Discharge instructions with Rx were given to pt. Understands all instructions well. Questions were asked and answered Stable.

## 2013-10-25 NOTE — Lactation Note (Signed)
This note was copied from the chart of Kimberly [name]. Lactation Consultation Note  Patient Name: Kimberly [name] [id number] Date: 10/25/2013 Reason for consult: Follow-up assessment;NICU baby;Late preterm infant;Infant < 6lbs  Mom reports that she is pumping every 3 hours for 15 minutes, has not started to receive EBM yet, reassured Mom this to be normal. Mom will be d/c today, she reports being active with [facility], referral faxed. Gave Mom the number for the BF hotline to call to arrange for pump. Mom may need [facility] loaner before d/c.   Maternal Data    Feeding Feeding Type: Formula Length of feed: 20 min  LATCH Score/Interventions                      Lactation Tools Discussed/Used Tools: Pump Breast pump type: Double-Electric Breast Pump WIC Program: Yes   Consult Status Consult Status: Complete Date: 10/25/13 Follow-up type: In-patient    Katrine Coho 10/25/2013, 11:59 AM

## 2013-10-28 NOTE — Progress Notes (Signed)
Post discharge review completed. 

## 2013-10-28 NOTE — Progress Notes (Addendum)
Clinical Social Work Department PSYCHOSOCIAL ASSESSMENT - MATERNAL/CHILD 10/25/2013  Patient:  Kimberly [name], Kimberly [name]  Account Number:  [id number]  [place] Date:  10/23/2013  Ardine Eng Name:   Park Meo    Clinical Social Worker:  Terri Piedra, LCSW   Date/Time:  10/25/2013 01:30 PM  Date Referred:        Other referral source:   No referral/NICU admission    I:  FAMILY / Vol[place] legal guardian:  PARENT  Guardian - Name Guardian - Age Guardian - Address  Kimberly [name] [address] [place], [place], [place] [address]  Darryl Lent  same   Other household support members/support persons Other support:   Parents report having a good support system, however, MOB states her family lives on the other side of [place].    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  FOB works at [employer], [employer].  He states he will go back to work Sunday and will have a week off when baby come home from the [place].   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 If Medicaid - County:  [place] (baby will have Medicaid only.)  School / Grade: MOB-[employer] in [employer] for Nursing.  Maternity Care Coordinator / Child Services Coordination / Early Interventions:  Cultural issues impacting care:   None stated    III  STRENGTHS Strengths  Adequate Resources  Compliance with medical plan  Other - See comment  Supportive family/friends  Understanding of illness   Strength comment:  Pediatric follow up will be at [facility].   IV  RISK FACTORS AND CURRENT PROBLEMS Current Problem:  None   Risk Factor & Current Problem Patient Issue Family Issue Risk Factor / Current Problem Comment   N N     V  SOCIAL WORK ASSESSMENT  CSW met with parents at baby's  bedside to introduce myself, complete assessment, and evaluate how parents are coping with baby's premature birth and admission to NICU.  Parents were very friendly and welcoming of CSW's visit.  They state baby is doing well and report no questions or needs at this time.  They appear to have a good understanding of baby's medical condition.  CSW discussed signs and symptoms of PPD and asked MOB to talk with CSW or her doctor if she has concerns at any time.  She agreed.  Parents report that they do not have all necessary baby supplies yet, as baby was "6 weeks early" but think they should be able to get what they need by the time baby is ready to come home.  Parents report being in a positive relationship and are currently living together.  They state they have good supports.  CSW explained ongoing support services offered by NICU CSW and gave contact information.  They report no issues with transportation once MOB is discharged today.  Parents thanked CSW for visiting.   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

## 2013-10-30 ENCOUNTER — Ambulatory Visit: Payer: Self-pay

## 2013-10-30 NOTE — Lactation Note (Signed)
This note was copied from the chart of Kimberly [name]. Lactation Consultation Note      Follow up consult with this mom of a NICU baby, now 63 days old, and 35 6/7 weeks corrected gestation. Mom has been pumping every 3 hours, and her milk just started coming in on day 6. She is a diabetic, and has high blood pressure, for which she is now on Lisinipril. Mom is expressing about 10 mls at a time . i advised her to pump every 4 hours at night, and increase to every 2-3 during the day, and to power pump once a day. I am hoping a little more sleep will help her supply. I asked her to ask her doctor about starting herbs to increase her supply. I also told mom to think about latching her baby - she is thinking to just pump and bottle feed. I also advised her to add skin to skin, which should also help. I will follow this mom in the NICU.  Patient Name: Kimberly Jayln Liv [id number] Date: 10/30/2013 Reason for consult: Follow-up assessment;NICU baby   Maternal Data    Feeding Feeding Type: Formula Length of feed: 45 min (25 PO/ 5 min NG)  LATCH Score/Interventions                      Lactation Tools Discussed/Used     Consult Status Consult Status: Follow-up Follow-up type:  (prn in NICU)    Tonna Corner 10/30/2013, 4:09 PM

## 2013-11-01 ENCOUNTER — Ambulatory Visit: Payer: Self-pay

## 2013-11-01 NOTE — Lactation Note (Signed)
This note was copied from the chart of Kimberly Jamae Straus. Lactation Consultation Note       Follow up consult with this mom of a NICU baby, now 38 days old, and 36 1/7 weeks corrected gestation. Mom reports she is feeling more rested with pumping every 4 hours at night. I encouraged her to still get in 8 pumpings a day. She has not tried power pumping yet. She is willing to start herbs to increase her supply, so I gave her information on moringa and fenugreek, and suggested she speak to her doctor before starting, since mom has diabetes and high blood pressure. I will follow this family in the nICU.  Patient Name: Kimberly [name] [id number] Date: 11/01/2013 Reason for consult: Follow-up assessment;NICU baby   Maternal Data    Feeding Feeding Type: Formula Nipple Type: Slow - flow Length of feed: 30 min  LATCH Score/Interventions                      Lactation Tools Discussed/Used     Consult Status Consult Status: PRN Follow-up type:  (in NICU)    Tonna Corner 11/01/2013, 12:34 PM

## 2013-11-13 ENCOUNTER — Encounter: Payer: Self-pay | Admitting: *Deleted

## 2013-12-06 ENCOUNTER — Encounter: Payer: Self-pay | Admitting: Family Medicine

## 2013-12-06 ENCOUNTER — Ambulatory Visit (INDEPENDENT_AMBULATORY_CARE_PROVIDER_SITE_OTHER): Payer: PRIVATE HEALTH INSURANCE | Admitting: Family Medicine

## 2013-12-06 VITALS — BP 125/84 | HR 105 | Temp 97.5°F | Ht 65.0 in | Wt 179.3 lb

## 2013-12-06 DIAGNOSIS — IMO0001 Reserved for inherently not codable concepts without codable children: Secondary | ICD-10-CM

## 2013-12-06 DIAGNOSIS — Z30017 Encounter for initial prescription of implantable subdermal contraceptive: Secondary | ICD-10-CM

## 2013-12-06 DIAGNOSIS — Z01812 Encounter for preprocedural laboratory examination: Secondary | ICD-10-CM

## 2013-12-06 LAB — POCT PREGNANCY, URINE: PREG TEST UR: NEGATIVE

## 2013-12-06 MED ORDER — ETONOGESTREL 68 MG ~~LOC~~ IMPL
68.0000 mg | DRUG_IMPLANT | Freq: Once | SUBCUTANEOUS | Status: AC
  Administered 2013-12-06: 68 mg via SUBCUTANEOUS

## 2013-12-06 NOTE — Patient Instructions (Signed)
Etonogestrel implant What is this medicine? ETONOGESTREL (et oh noe JES trel) is a contraceptive (birth control) device. It is used to prevent pregnancy. It can be used for up to 3 years. This medicine may be used for other purposes; ask your health care provider or pharmacist if you have questions. COMMON BRAND NAME(S): Implanon, Nexplanon  What should I tell my health care provider before I take this medicine? They need to know if you have any of these conditions: -abnormal vaginal bleeding -blood vessel disease or blood clots -cancer of the breast, cervix, or liver -depression -diabetes -gallbladder disease -headaches -heart disease or recent heart attack -high blood pressure -high cholesterol -kidney disease -liver disease -renal disease -seizures -tobacco smoker -an unusual or allergic reaction to etonogestrel, other hormones, anesthetics or antiseptics, medicines, foods, dyes, or preservatives -pregnant or trying to get pregnant -breast-feeding How should I use this medicine? This device is inserted just under the skin on the inner side of your upper arm by a health care professional. Talk to your pediatrician regarding the use of this medicine in children. Special care may be needed. Overdosage: If you think you've taken too much of this medicine contact a poison control center or emergency room at once. Overdosage: If you think you have taken too much of this medicine contact a poison control center or emergency room at once. NOTE: This medicine is only for you. Do not share this medicine with others. What if I miss a dose? This does not apply. What may interact with this medicine? Do not take this medicine with any of the following medications: -amprenavir -bosentan -fosamprenavir This medicine may also interact with the following medications: -barbiturate medicines for inducing sleep or treating seizures -certain medicines for fungal infections like ketoconazole and  itraconazole -griseofulvin -medicines to treat seizures like carbamazepine, felbamate, oxcarbazepine, phenytoin, topiramate -modafinil -phenylbutazone -rifampin -some medicines to treat HIV infection like atazanavir, indinavir, lopinavir, nelfinavir, tipranavir, ritonavir -St. John's wort This list may not describe all possible interactions. Give your health care provider a list of all the medicines, herbs, non-prescription drugs, or dietary supplements you use. Also tell them if you smoke, drink alcohol, or use illegal drugs. Some items may interact with your medicine. What should I watch for while using this medicine? This product does not protect you against HIV infection (AIDS) or other sexually transmitted diseases. You should be able to feel the implant by pressing your fingertips over the skin where it was inserted. Tell your doctor if you cannot feel the implant. What side effects may I notice from receiving this medicine? Side effects that you should report to your doctor or health care professional as soon as possible: -allergic reactions like skin rash, itching or hives, swelling of the face, lips, or tongue -breast lumps -changes in vision -confusion, trouble speaking or understanding -dark urine -depressed mood -general ill feeling or flu-like symptoms -light-colored stools -loss of appetite, nausea -right upper belly pain -severe headaches -severe pain, swelling, or tenderness in the abdomen -shortness of breath, chest pain, swelling in a leg -signs of pregnancy -sudden numbness or weakness of the face, arm or leg -trouble walking, dizziness, loss of balance or coordination -unusual vaginal bleeding, discharge -unusually weak or tired -yellowing of the eyes or skin Side effects that usually do not require medical attention (Report these to your doctor or health care professional if they continue or are bothersome.): -acne -breast pain -changes in  weight -cough -fever or chills -headache -irregular menstrual bleeding -itching, burning,   and vaginal discharge -pain or difficulty passing urine -sore throat This list may not describe all possible side effects. Call your doctor for medical advice about side effects. You may report side effects to FDA at 1-800-FDA-1088. Where should I keep my medicine? This drug is given in a hospital or clinic and will not be stored at home. NOTE: This sheet is a summary. It may not cover all possible information. If you have questions about this medicine, talk to your doctor, pharmacist, or health care provider.  2014, Elsevier/Gold Standard. (2012-02-20 15:37:45)  

## 2013-12-06 NOTE — Progress Notes (Signed)
Patient ID: [name], female   DOB: Apr 10, 1991, 23 y.o.   MRN: [id number] Subjective:    DEVINN STEENBERG is a 23 y.o. G35P0101 African [place] female who presents for a postpartum visit. She is 7 weeks postpartum following a vacuum assisted vaginal delivery and partial 3rd degree tear. I have fully reviewed the prenatal and intrapartum course. The delivery was at [redacted]w[redacted]d gestational weeks due to pre-E. Outcome: vacuum, outlet. Anesthesia: epidural. Postpartum course has been complicated by HTN for which she was on lisinopril but has been off it for the last 3 weeks. She also has type 1 DM for which she is now still on the pump and following up with Dr. Michiel Sites.  Baby's course has been uncomplicated. Baby is feeding by breast. Bleeding no bleeding. Bowel function is normal. Bladder function is normal. Patient is not sexually active. Contraception method is Nexplanon. Postpartum depression screening: negative.  The following portions of the patient's history were reviewed and updated as appropriate: allergies, current medications, past medical history, past surgical history and problem list.  Review of Systems Pertinent items are noted in HPI.   Filed Vitals:   12/06/13 1110  BP: 125/84  Pulse: 105  Temp: 97.5 F (36.4 C)  TempSrc: Oral  Height: 5\' 5"  (1.651 m)  Weight: 179 lb 4.8 oz (81.33 kg)    Objective:     General:  alert, cooperative and no distress   Breasts:  deferred, no complaints  Lungs: clear to auscultation bilaterally  Heart:  regular rate and rhythm  Abdomen: soft, nontender   Vulva: normal  Vagina: normal vagina. Well healed third. Small area of skin breakdown on the perineum where a suture was coming through. Suture removed without difficulty with improvement in her pain immediately.   Cervix:  closed  Corpus: Well-involuted  Adnexa:  Non-palpable  Rectal Exam: no hemorrhoids, good sphincter tone.         Assessment:   normal postpartum exam 7 wks s/p  VAVD Depression screening Contraception counseling   Plan:   Contraception: Nexplanon. Placed. See procedure below.   Follow up in: 1 year for routine Gyn exam and f/u with PCP for DM or as needed.   NEXPLANON PROCEDURE NOTE  Risks/benefits/side effects of Nexplanon have been discussed and her questions have been answered.  Specifically, a failure rate of 08/998 has been reported, with an increased failure rate if pt takes Ingram and/or antiseizure medicaitons.  Nai N Spayd is aware of the common side effect of irregular bleeding, which the incidence of decreases over time.  Her left arm, approximatly 4 inches proximal from the elbow, was cleansed with alcohol and anesthetized with 2cc of 2% Lidocaine.  The area was cleansed again and the Nexplanon was inserted without difficulty.  A pressure bandage was applied.  Pt was instructed to remove pressure bandage in a few hours, and keep insertion site covered with a bandaid for 3 days.  Back up contraception was recommended for 2 weeks.  Follow-up scheduled PRN problems  Kassie Mends, MD  12/06/2013 11:42 AM

## 2013-12-16 ENCOUNTER — Encounter: Payer: Self-pay | Admitting: *Deleted

## 2013-12-17 NOTE — Telephone Encounter (Signed)
Telephone encounter.

## 2014-02-10 ENCOUNTER — Telehealth: Payer: Self-pay | Admitting: *Deleted

## 2014-02-10 ENCOUNTER — Telehealth: Payer: Self-pay | Admitting: General Practice

## 2014-02-10 NOTE — Telephone Encounter (Signed)
Attempted to call patient, no answer, left message that patient needs to come to office and take form to her primary care physician.

## 2014-02-10 NOTE — Telephone Encounter (Signed)
Called patient to informed her that I received her phone call and will speak with Dr Harolyn Rutherford tomorrow since she saw her often during her pregnancy to see if she can fill the form out and I will give her a call back tomorrow. Patient verbalized understanding and had no further questions

## 2014-02-10 NOTE — Telephone Encounter (Signed)
Patient called and left message stating she sent [place] a form over from the [facility] and needs it filled out. States she doesn't have a pcp since she was pregnant recently and seen here and managed for her diabetes she was dropped from her pcp. Also states that the [facility] told her they need it filled out by whoever has been managing her diabetes for the past year which would be [place] since she was pregnant and high risk. Patient states she needs this form filled out as soon as possible otherwise she will get her license taken.

## 2014-02-11 NOTE — Telephone Encounter (Signed)
Forms completed by Dr Harolyn Rutherford and faxed to [facility] number supplied on ROI. Called patient and informed her of completion and fax. Patient verbalized understanding and had no questions

## 2014-03-26 ENCOUNTER — Encounter (HOSPITAL_COMMUNITY): Payer: Self-pay | Admitting: Emergency Medicine

## 2014-03-26 ENCOUNTER — Emergency Department (HOSPITAL_COMMUNITY)
Admission: EM | Admit: 2014-03-26 | Discharge: 2014-03-26 | Disposition: A | Discharge status: Home / Self Care | Payer: PRIVATE HEALTH INSURANCE | Attending: Emergency Medicine | Admitting: Emergency Medicine

## 2014-03-26 DIAGNOSIS — E119 Type 2 diabetes mellitus without complications: Secondary | ICD-10-CM | POA: Insufficient documentation

## 2014-03-26 DIAGNOSIS — I10 Essential (primary) hypertension: Secondary | ICD-10-CM | POA: Insufficient documentation

## 2014-03-26 DIAGNOSIS — Z794 Long term (current) use of insulin: Secondary | ICD-10-CM | POA: Insufficient documentation

## 2014-03-26 DIAGNOSIS — Z79899 Other long term (current) drug therapy: Secondary | ICD-10-CM | POA: Insufficient documentation

## 2014-03-26 DIAGNOSIS — Z862 Personal history of diseases of the blood and blood-forming organs and certain disorders involving the immune mechanism: Secondary | ICD-10-CM | POA: Insufficient documentation

## 2014-03-26 DIAGNOSIS — L0231 Cutaneous abscess of buttock: Secondary | ICD-10-CM | POA: Insufficient documentation

## 2014-03-26 DIAGNOSIS — L03317 Cellulitis of buttock: Secondary | ICD-10-CM

## 2014-03-26 MED ORDER — CLINDAMYCIN HCL 150 MG PO CAPS: 300.0000 mg | ORAL_CAPSULE | Freq: Three times a day (TID) | ORAL | Status: DC

## 2014-03-26 MED ORDER — HYDROCODONE-ACETAMINOPHEN 5-325 MG PO TABS: 1.0000 | ORAL_TABLET | ORAL | Status: DC | PRN

## 2014-03-26 NOTE — ED Notes (Signed)
Pt c/o abscess on lt butt cheek since Monday.  C/o pain.

## 2014-03-26 NOTE — ED Provider Notes (Signed)
CSN: [id number]     Arrival date & time 03/26/14  1043 History   First MD Initiated Contact with Patient 03/26/14 1124     Chief Complaint  Patient presents with  . Abscess     (Consider location/radiation/quality/duration/timing/severity/associated sxs/prior Treatment) HPI Comments: Patient is a 23 year old female with a past medical history of diabetes who presents with left buttock for the past 3 days. Symptoms started gradually and progressively worsened since the onset. The pain is throbbing and severe without radiation. Patient tried OTC medication without relief. Patient reports associated swelling and tenderness to palpation. No alleviating factors.    Past Medical History  Diagnosis Date  . Diabetes mellitus without complication   . Sickle cell trait   . Hypertension    Past Surgical History  Procedure Laterality Date  . Wisdom tooth extraction     Family History  Problem Relation Age of Onset  . Lupus Mother   . Aneurysm Mother    History  Substance Use Topics  . Smoking status: Never Smoker   . Smokeless tobacco: Never Used  . Alcohol Use: No   OB History   Grav Para Term Preterm Abortions TAB SAB Ect Mult Living   _0 Review of Systems  Constitutional: Negative for fever, chills and fatigue.  HENT: Negative for trouble swallowing.   Eyes: Negative for visual disturbance.  Respiratory: Negative for shortness of breath.   Cardiovascular: Negative for chest pain and palpitations.  Gastrointestinal: Negative for nausea, vomiting, abdominal pain and diarrhea.  Genitourinary: Negative for dysuria and difficulty urinating.  Musculoskeletal: Negative for arthralgias and neck pain.  Skin: Positive for wound. Negative for color change.  Neurological: Negative for dizziness and weakness.  Psychiatric/Behavioral: Negative for dysphoric mood.      Allergies  Review of patient's allergies indicates no known allergies.  Home Medications   Prior  to Admission medications   Medication Sig Start Date End Date Taking? Authorizing Provider  Blood Glucose Monitoring Suppl (CONTOUR NEXT EZ MONITOR) W/DEVICE KIT 1 each by Does not apply route 6 (six) times daily. Provide lancets for testing six times daily. Type I DM with diabetes on Insulin Pump  DX 648.03 06/17/13  Yes Woodroe Mode, MD  etonogestrel (NEXPLANON) 68 MG IMPL implant Inject 1 each into the skin once.   Yes Historical Provider, MD  glucose blood (BAYER CONTOUR NEXT TEST) test strip Use as instructed 06/17/13  Yes Woodroe Mode, MD  Insulin Human (INSULIN PUMP) SOLN Inject into the skin. Medtronic 530 series with "bolus wizard" dispensing Novolog.   Yes Historical Provider, MD   BP 147/85  Pulse 97  Temp(Src) 98.4 F (36.9 C) (Oral)  Resp 16  SpO2 100%  LMP 03/25/2014  Breastfeeding? No Physical Exam  Nursing note and vitals reviewed. Constitutional: She is oriented to person, place, and time. She appears well-developed and well-nourished. No distress.  HENT:  Head: Normocephalic and atraumatic.  Eyes: Conjunctivae are normal.  Neck: Normal range of motion.  Cardiovascular: Normal rate and regular rhythm.  Exam reveals no gallop and no friction rub.   No murmur heard. Pulmonary/Chest: Effort normal and breath sounds normal. She has no wheezes. She has no rales. She exhibits no tenderness.  Abdominal: Soft. There is no tenderness.  Genitourinary:  Left buttock, near labia majora is indurated, erythematous and tender to palpation.   Musculoskeletal: Normal range of motion.  Neurological: She is alert  and oriented to person, place, and time.  Speech is goal-oriented. Moves limbs without ataxia.   Skin: Skin is warm and dry.  Psychiatric: She has a normal mood and affect. Her behavior is normal.    ED Course  Procedures (including critical care time) Labs Review Labs Reviewed - No data to display  INCISION AND DRAINAGE Performed by: Alvina Chou Consent:  Verbal consent obtained. Risks and benefits: risks, benefits and alternatives were discussed Type: abscess  Body area: left buttock  Anesthesia: local infiltration  Incision was made with a scalpel.  Local anesthetic: lidocaine 1% without epinephrine  Anesthetic total: 2 ml  Complexity: complex Blunt dissection to break up loculations  Drainage: sanguinous  Drainage amount: scant amount  Packing material: none  Patient tolerance: Patient tolerated the procedure well with no immediate complications.     Imaging Review No results found.   EKG Interpretation None      MDM   Final diagnoses:  Cellulitis of left buttock    11:49 AM Vitals stable and patient afebrile. The incision and drainage produced no purulent drainage. Patient likely has cellulitis of left buttock. Patient will be discharged with antibiotics and pain medication. Patient instructed to return with worsening or concerning symptoms.    Alvina Chou, PA-C 03/26/14 1210

## 2014-03-26 NOTE — Discharge Instructions (Signed)
Take Clindamycin as directed until gone. Take Vicodin as needed for pain. Refer to attached documents for more information. Follow up with your doctor as needed. Return to the ED with worsening or concerning symptoms.

## 2014-03-29 NOTE — ED Provider Notes (Signed)
Medical screening examination/treatment/procedure(s) were performed by non-physician practitioner and as supervising physician I was immediately available for consultation/collaboration.   EKG Interpretation None        Alfonzo Feller, DO 03/29/14 [phone]

## 2014-04-22 ENCOUNTER — Encounter: Payer: Self-pay | Admitting: General Practice

## 2014-04-23 ENCOUNTER — Encounter: Payer: Self-pay | Admitting: *Deleted

## 2014-05-15 ENCOUNTER — Other Ambulatory Visit: Payer: Self-pay | Admitting: Obstetrics & Gynecology

## 2014-06-30 ENCOUNTER — Encounter (HOSPITAL_COMMUNITY): Payer: Self-pay | Admitting: Emergency Medicine

## 2014-07-29 ENCOUNTER — Other Ambulatory Visit: Payer: Self-pay | Admitting: Obstetrics & Gynecology

## 2014-08-01 ENCOUNTER — Telehealth: Payer: Self-pay | Admitting: *Deleted

## 2014-08-01 NOTE — Telephone Encounter (Signed)
Kimberly [name] called and left a message that she had immunizations earlier this year and wants to know if she needs titers or boosters- not sure what she got. Called Kimberly [name] and left a message I am returning her call- please call back Monday as we are closed for the weekend.

## 2014-08-04 NOTE — Telephone Encounter (Signed)
Called patient, no answer- left message stating we are trying to return her phone call, if she is still needing assistance please call us back at the clinics

## 2014-08-26 ENCOUNTER — Other Ambulatory Visit: Payer: Self-pay | Admitting: Obstetrics & Gynecology

## 2014-09-30 ENCOUNTER — Telehealth: Payer: Self-pay | Admitting: General Practice

## 2014-09-30 ENCOUNTER — Other Ambulatory Visit: Payer: Self-pay | Admitting: Obstetrics & Gynecology

## 2014-09-30 DIAGNOSIS — O24311 Unspecified pre-existing diabetes mellitus in pregnancy, first trimester: Secondary | ICD-10-CM

## 2014-09-30 NOTE — Telephone Encounter (Signed)
Patient called and left message stating she was a patient here last year when she had her son and she is currently trying to get in with an endocrinologist but they are saying she needs a referral. Patient states please refer her to Dr Debbora Presto and also she needs a refill on her novolog and her test strips. Please call her back. Per chart review, patient is already a patient at Dr Nash Dimmer office. Contacted doctor's office and requested a nurse contact patient as she has questions and problems getting an appt. Called patient, no answer- left message that we are trying to contact her to return her phone call and we have requested a nurse from her other doctor's office to contact her. Note: patient needs to see a pcp for these concerns and they should refer her if a referral is needed due to change in insurance

## 2014-10-01 ENCOUNTER — Telehealth: Payer: Self-pay | Admitting: *Deleted

## 2014-10-01 NOTE — Telephone Encounter (Signed)
Kimberly [name] left a message she appreciates [place] sending in her order for insulin but that she also needs prescription for bayer contour 100 test strips so insurance will cover it - sent to [facility]- states she asked them to fax it. Per chart review and discussion with Derinda Late, RN  Patient had previously called for refills- patient is no longer pregnant and goes to Dr. Debbora Presto and  His office nurse is supposed to call her.  She needs refills to go thru Dr. Chalmers Cater.  Called Natasha and left a message we are calling her back, but her refills need to go thru her medical provider- may call us back to discuss.

## 2014-10-02 NOTE — Telephone Encounter (Signed)
Called Albina and left another message that we are returning your call. Please call your endocrinologist for your refills. Call us if you have questions.

## 2014-11-26 ENCOUNTER — Other Ambulatory Visit: Payer: Self-pay | Admitting: Gastroenterology

## 2014-11-26 ENCOUNTER — Ambulatory Visit
Admission: RE | Admit: 2014-11-26 | Discharge: 2014-11-26 | Disposition: A | Discharge status: Home / Self Care | Payer: BLUE CROSS/BLUE SHIELD | Source: Ambulatory Visit | Attending: Gastroenterology | Admitting: Gastroenterology

## 2014-11-26 DIAGNOSIS — R7989 Other specified abnormal findings of blood chemistry: Secondary | ICD-10-CM

## 2014-11-26 DIAGNOSIS — R945 Abnormal results of liver function studies: Secondary | ICD-10-CM

## 2014-12-04 ENCOUNTER — Other Ambulatory Visit: Payer: PRIVATE HEALTH INSURANCE

## 2015-03-17 ENCOUNTER — Encounter (HOSPITAL_COMMUNITY): Payer: Self-pay

## 2015-03-17 ENCOUNTER — Inpatient Hospital Stay (HOSPITAL_COMMUNITY)
Admission: EM | Admit: 2015-03-17 | Discharge: 2015-03-22 | DRG: 638 | Disposition: A | Discharge status: Home / Self Care | Payer: BLUE CROSS/BLUE SHIELD | Attending: Family Medicine | Admitting: Family Medicine

## 2015-03-17 DIAGNOSIS — D72829 Elevated white blood cell count, unspecified: Secondary | ICD-10-CM

## 2015-03-17 DIAGNOSIS — Z9114 Patient's other noncompliance with medication regimen: Secondary | ICD-10-CM | POA: Diagnosis present

## 2015-03-17 DIAGNOSIS — R110 Nausea: Secondary | ICD-10-CM

## 2015-03-17 DIAGNOSIS — I10 Essential (primary) hypertension: Secondary | ICD-10-CM | POA: Diagnosis present

## 2015-03-17 DIAGNOSIS — E873 Alkalosis: Secondary | ICD-10-CM | POA: Diagnosis present

## 2015-03-17 DIAGNOSIS — E876 Hypokalemia: Secondary | ICD-10-CM | POA: Diagnosis not present

## 2015-03-17 DIAGNOSIS — R112 Nausea with vomiting, unspecified: Secondary | ICD-10-CM | POA: Diagnosis present

## 2015-03-17 DIAGNOSIS — R1012 Left upper quadrant pain: Secondary | ICD-10-CM

## 2015-03-17 DIAGNOSIS — E162 Hypoglycemia, unspecified: Secondary | ICD-10-CM | POA: Diagnosis not present

## 2015-03-17 DIAGNOSIS — E1010 Type 1 diabetes mellitus with ketoacidosis without coma: Secondary | ICD-10-CM | POA: Diagnosis present

## 2015-03-17 DIAGNOSIS — R109 Unspecified abdominal pain: Secondary | ICD-10-CM

## 2015-03-17 DIAGNOSIS — D573 Sickle-cell trait: Secondary | ICD-10-CM | POA: Diagnosis present

## 2015-03-17 DIAGNOSIS — E1021 Type 1 diabetes mellitus with diabetic nephropathy: Secondary | ICD-10-CM

## 2015-03-17 DIAGNOSIS — Z9641 Presence of insulin pump (external) (internal): Secondary | ICD-10-CM | POA: Diagnosis present

## 2015-03-17 DIAGNOSIS — R1011 Right upper quadrant pain: Secondary | ICD-10-CM

## 2015-03-17 DIAGNOSIS — Z794 Long term (current) use of insulin: Secondary | ICD-10-CM

## 2015-03-17 DIAGNOSIS — Z8719 Personal history of other diseases of the digestive system: Secondary | ICD-10-CM

## 2015-03-17 LAB — GLUCOSE, CAPILLARY
Glucose-Capillary: 474 mg/dL — ABNORMAL HIGH (ref 65–99)
Glucose-Capillary: 260 mg/dL — ABNORMAL HIGH (ref 65–99)
Glucose-Capillary: 109 mg/dL — ABNORMAL HIGH (ref 65–99)

## 2015-03-17 LAB — MRSA PCR SCREENING: MRSA BY PCR: NEGATIVE

## 2015-03-17 LAB — CBC
HCT: 35.8 % — ABNORMAL LOW (ref 36.0–46.0)
Hemoglobin: 12.3 g/dL (ref 12.0–15.0)
MCH: 28 pg (ref 26.0–34.0)
MCHC: 34.4 g/dL (ref 30.0–36.0)
MCV: 81.4 fL (ref 78.0–100.0)
PLATELETS: 325 10*3/uL (ref 150–400)
RBC: 4.4 MIL/uL (ref 3.87–5.11)
RDW: 16.5 % — AB (ref 11.5–15.5)
WBC: 17.6 10*3/uL — AB (ref 4.0–10.5)

## 2015-03-17 LAB — URINE MICROSCOPIC-ADD ON

## 2015-03-17 LAB — CBC WITH DIFFERENTIAL/PLATELET
Basophils Absolute: 0.1 10*3/uL (ref 0.0–0.1)
Basophils Relative: 1 % (ref 0–1)
Eosinophils Absolute: 0 10*3/uL (ref 0.0–0.7)
Eosinophils Relative: 0 % (ref 0–5)
HCT: 36.6 % (ref 36.0–46.0)
Hemoglobin: 12.5 g/dL (ref 12.0–15.0)
LYMPHS ABS: 1.2 10*3/uL (ref 0.7–4.0)
Lymphocytes Relative: 12 % (ref 12–46)
MCH: 27.4 pg (ref 26.0–34.0)
MCHC: 34.2 g/dL (ref 30.0–36.0)
MCV: 80.3 fL (ref 78.0–100.0)
Monocytes Absolute: 0.2 10*3/uL (ref 0.1–1.0)
Monocytes Relative: 2 % — ABNORMAL LOW (ref 3–12)
Neutro Abs: 8.8 10*3/uL — ABNORMAL HIGH (ref 1.7–7.7)
Neutrophils Relative %: 85 % — ABNORMAL HIGH (ref 43–77)
PLATELETS: 341 10*3/uL (ref 150–400)
RBC: 4.56 MIL/uL (ref 3.87–5.11)
RDW: 16.7 % — AB (ref 11.5–15.5)
WBC: 10.3 10*3/uL (ref 4.0–10.5)

## 2015-03-17 LAB — BASIC METABOLIC PANEL
Anion gap: 20 — ABNORMAL HIGH (ref 5–15)
BUN: 11 mg/dL (ref 6–20)
CALCIUM: 8.6 mg/dL — AB (ref 8.9–10.3)
CO2: 6 mmol/L — ABNORMAL LOW (ref 22–32)
CREATININE: 0.98 mg/dL (ref 0.44–1.00)
Chloride: 114 mmol/L — ABNORMAL HIGH (ref 101–111)
GFR calc Af Amer: >60 mL/min (ref >60)
GFR calc non Af Amer: >60 mL/min (ref >60)
Glucose, Bld: 169 mg/dL — ABNORMAL HIGH (ref 65–99)
POTASSIUM: 4.8 mmol/L (ref 3.5–5.1)
Sodium: 140 mmol/L (ref 135–145)

## 2015-03-17 LAB — COMPREHENSIVE METABOLIC PANEL
ALBUMIN: 4.3 g/dL (ref 3.5–5.0)
ALT: 23 U/L (ref 14–54)
AST: 44 U/L — AB (ref 15–41)
Alkaline Phosphatase: 164 U/L — ABNORMAL HIGH (ref 38–126)
Anion gap: 24 — ABNORMAL HIGH (ref 5–15)
BILIRUBIN TOTAL: 2 mg/dL — AB (ref 0.3–1.2)
BUN: 11 mg/dL (ref 6–20)
CHLORIDE: 104 mmol/L (ref 101–111)
CO2: 8 mmol/L — ABNORMAL LOW (ref 22–32)
Calcium: 9.4 mg/dL (ref 8.9–10.3)
Creatinine, Ser: 1.02 mg/dL — ABNORMAL HIGH (ref 0.44–1.00)
Glucose, Bld: 501 mg/dL — ABNORMAL HIGH (ref 65–99)
Potassium: 4.9 mmol/L (ref 3.5–5.1)
Sodium: 136 mmol/L (ref 135–145)
Total Protein: 8.8 g/dL — ABNORMAL HIGH (ref 6.5–8.1)

## 2015-03-17 LAB — BLOOD GAS, VENOUS
Acid-base deficit: 24.7 mmol/L — ABNORMAL HIGH (ref 0.0–2.0)
Bicarbonate: 5 mEq/L — ABNORMAL LOW (ref 20.0–24.0)
O2 Saturation: 59.8 %
PATIENT TEMPERATURE: 98.6
PCO2 VEN: 18.7 mmHg — AB (ref 45.0–50.0)
TCO2: 5 mmol/L (ref 0–100)
pH, Ven: 7.057 — CL (ref 7.250–7.300)
pO2, Ven: 41.7 mmHg (ref 30.0–45.0)

## 2015-03-17 LAB — I-STAT BETA HCG BLOOD, ED (MC, WL, AP ONLY)

## 2015-03-17 LAB — URINALYSIS, ROUTINE W REFLEX MICROSCOPIC
Bilirubin Urine: NEGATIVE
Glucose, UA: >1000 mg/dL — AB
Leukocytes, UA: NEGATIVE
NITRITE: NEGATIVE
PH: 5.5 (ref 5.0–8.0)
PROTEIN: 30 mg/dL — AB
Specific Gravity, Urine: 1.018 (ref 1.005–1.030)
Urobilinogen, UA: 0.2 mg/dL (ref 0.0–1.0)

## 2015-03-17 LAB — LIPASE, BLOOD: LIPASE: 14 U/L — AB (ref 22–51)

## 2015-03-17 LAB — CBG MONITORING, ED: Glucose-Capillary: 506 mg/dL — ABNORMAL HIGH (ref 65–99)

## 2015-03-17 LAB — PHOSPHORUS: PHOSPHORUS: 3.9 mg/dL (ref 2.5–4.6)

## 2015-03-17 LAB — BETA-HYDROXYBUTYRIC ACID: Beta-Hydroxybutyric Acid: 5.88 mmol/L — ABNORMAL HIGH (ref 0.05–0.27)

## 2015-03-17 LAB — MAGNESIUM: Magnesium: 1.9 mg/dL (ref 1.7–2.4)

## 2015-03-17 MED ORDER — ONDANSETRON HCL 4 MG/2ML IJ SOLN: 4.0000 mg | Freq: Once | INTRAMUSCULAR | Status: DC

## 2015-03-17 MED ORDER — POTASSIUM CHLORIDE 10 MEQ/100ML IV SOLN
10.0000 meq | INTRAVENOUS | Status: AC
  Administered 2015-03-17 (×2): 10 meq via INTRAVENOUS
  Filled 2015-03-17 (×2): qty 100

## 2015-03-17 MED ORDER — INSULIN REGULAR HUMAN 100 UNIT/ML IJ SOLN
INTRAMUSCULAR | Status: DC
  Administered 2015-03-18: 0.8 [IU]/h via INTRAVENOUS
  Administered 2015-03-19: 0.6 [IU]/h via INTRAVENOUS
  Filled 2015-03-17 (×3): qty 2.5

## 2015-03-17 MED ORDER — INSULIN REGULAR BOLUS VIA INFUSION
0.0000 [IU] | Freq: Three times a day (TID) | INTRAVENOUS | Status: DC
  Filled 2015-03-17: qty 10

## 2015-03-17 MED ORDER — HEPARIN SODIUM (PORCINE) 5000 UNIT/ML IJ SOLN
5000.0000 [IU] | Freq: Three times a day (TID) | INTRAMUSCULAR | Status: DC
  Administered 2015-03-17 – 2015-03-22 (×14): 5000 [IU] via SUBCUTANEOUS
  Filled 2015-03-17 (×13): qty 1

## 2015-03-17 MED ORDER — DEXTROSE-NACL 5-0.45 % IV SOLN: INTRAVENOUS | Status: DC

## 2015-03-17 MED ORDER — SODIUM CHLORIDE 0.9 % IV SOLN
INTRAVENOUS | Status: DC
Start: 2015-03-17 — End: 2015-03-17
  Administered 2015-03-17: 21:00:00 via INTRAVENOUS

## 2015-03-17 MED ORDER — INSULIN REGULAR HUMAN 100 UNIT/ML IJ SOLN
INTRAMUSCULAR | Status: DC
  Administered 2015-03-17: 4.5 [IU]/h via INTRAVENOUS
  Filled 2015-03-17: qty 2.5

## 2015-03-17 MED ORDER — ONDANSETRON HCL 4 MG/2ML IJ SOLN
4.0000 mg | Freq: Once | INTRAMUSCULAR | Status: AC
  Administered 2015-03-17: 4 mg via INTRAVENOUS
  Filled 2015-03-17: qty 2

## 2015-03-17 MED ORDER — METOCLOPRAMIDE HCL 5 MG/ML IJ SOLN
10.0000 mg | Freq: Once | INTRAMUSCULAR | Status: AC
  Administered 2015-03-17: 10 mg via INTRAVENOUS
  Filled 2015-03-17: qty 2

## 2015-03-17 MED ORDER — PNEUMOCOCCAL VAC POLYVALENT 25 MCG/0.5ML IJ INJ
0.5000 mL | INJECTION | INTRAMUSCULAR | Status: DC
  Filled 2015-03-17 (×2): qty 0.5

## 2015-03-17 MED ORDER — DEXTROSE-NACL 5-0.45 % IV SOLN
INTRAVENOUS | Status: DC
  Administered 2015-03-17 – 2015-03-19 (×4): via INTRAVENOUS

## 2015-03-17 MED ORDER — SODIUM CHLORIDE 0.9 % IV BOLUS (SEPSIS)
1000.0000 mL | Freq: Once | INTRAVENOUS | Status: AC
  Administered 2015-03-17: 1000 mL via INTRAVENOUS

## 2015-03-17 MED ORDER — SODIUM CHLORIDE 0.9 % IV SOLN: INTRAVENOUS | Status: DC

## 2015-03-17 MED ORDER — HYDROMORPHONE HCL 1 MG/ML IJ SOLN
0.5000 mg | Freq: Once | INTRAMUSCULAR | Status: AC
  Administered 2015-03-17: 0.5 mg via INTRAVENOUS
  Filled 2015-03-17: qty 1

## 2015-03-17 MED ORDER — DEXTROSE 50 % IV SOLN: 25.0000 mL | INTRAVENOUS | Status: DC | PRN

## 2015-03-17 NOTE — ED Notes (Signed)
ED PA at bedside

## 2015-03-17 NOTE — H&P (Signed)
[facility] History and Physical  Kimberly [name] MPN:[id number] DOB: Oct 30, 1990 DOA: 03/17/2015  Referring physician: Jeannett Senior, PA PCP: Foye Spurling, MD   Chief Complaint: Nausea Vomiting  HPI: Kimberly [name] is a 24 y.o. female with history of Type I diabetes presents with nausea and vomiting and DKA. Patient states that she has had nausea and vomiting going on for the last few hours. She states that she has had no blood in her vomit and that it basically looked like green bilious material. Patient states that she has had no diarrhea noted. She states that she has been compliant with her insulin regimen as ordered. Patient does note that she had recent blood work which was abnormal for elevated LFTs and this has been worked up as negative. She has no urinary complaints she denies having fevers or chills. She does admit to having abdominal pain at this time mainly located in the left side. Patient states that she does have a history of gallstones currently has no symptoms related to her gallbladder. Patient does not have frequent DKA episodes and thinks due to change in her insulin regimen this may have occurred but other than that she states she is compliant. Patient also has sickle cell trait and HTN by history.   Review of Systems:  Systems reviewed and are unremarkable other than noted in HPI  Past Medical History  Diagnosis Date  . Diabetes mellitus without complication   . Sickle cell trait   . Hypertension    Past Surgical History  Procedure Laterality Date  . Wisdom tooth extraction     Social History:  reports that she has never smoked. She has never used smokeless tobacco. She reports that she does not drink alcohol or use illicit drugs.  No Known Allergies  Family History  Problem Relation Age of Onset  . Lupus Mother   . Aneurysm Mother      Prior to Admission medications   Medication Sig Start Date End Date Taking? Authorizing Provider    BAYER CONTOUR NEXT TEST test strip USE AS DIRECTED TO TEST 6 TO 8 TIMES DAILY 10/01/14  Yes Woodroe Mode, MD  Blood Glucose Monitoring Suppl (CONTOUR NEXT EZ MONITOR) W/DEVICE KIT 1 each by Does not apply route 6 (six) times daily. Provide lancets for testing six times daily. Type I DM with diabetes on Insulin Pump  DX 648.03 06/17/13  Yes Woodroe Mode, MD  etonogestrel (NEXPLANON) 68 MG IMPL implant Inject 1 each into the skin once.   Yes Historical Provider, MD  Insulin Human (INSULIN PUMP) SOLN Inject into the skin continuous. Medtronic 530 series with "bolus wizard" dispensing Novolog.   Yes Historical Provider, MD  NOVOLOG 100 UNIT/ML injection INJECT UNDER THE SKIN VIA INSULIN PUMP AS DIRECTED Patient not taking: Reported on 03/17/2015 09/30/14   Osborne Oman, MD   Physical Exam: Filed Vitals:   03/17/15 1550 03/17/15 1735  BP: 147/80   Pulse: 107   Temp: 97.8 F (36.6 C)   TempSrc: Oral   Resp: 24   Height:  5' 5" (1.651 m)  Weight:  81.194 kg (179 lb)  SpO2: 100%     Wt Readings from Last 3 Encounters:  03/17/15 81.194 kg (179 lb)  12/06/13 81.33 kg (179 lb 4.8 oz)  10/25/13 94.348 kg (208 lb)    General:  Somnolent but comfortable Eyes: PERRL, normal lids ENT: grossly normal hearing mouth dry mucus membranes Neck: no LAD, masses or thyromegaly Cardiovascular: RRR,  no m/r/g. No LE edema. Respiratory: CTA bilaterally, no w/r/r. Normal respiratory effort. Abdomen: soft, non specific tenderness reported on exam Skin: no rash or induration seen Musculoskeletal: grossly normal tone BUE/BLE Psychiatric: somnolent so unable to assess fully. Appears to be appropriate however on gross exam Neurologic: grossly non-focal able to move all four exteremities          Labs on Admission:  Basic Metabolic Panel:  Recent Labs Lab 03/17/15 1600  NA 136  K 4.9  CL 104  CO2 8*  GLUCOSE 501*  BUN 11  CREATININE 1.02*  CALCIUM 9.4   Liver Function Tests:  Recent  Labs Lab 03/17/15 1600  AST 44*  ALT 23  ALKPHOS 164*  BILITOT 2.0*  PROT 8.8*  ALBUMIN 4.3    Recent Labs Lab 03/17/15 1600  LIPASE 14*   No results for input(s): AMMONIA in the last 168 hours. CBC:  Recent Labs Lab 03/17/15 1600  WBC 10.3  NEUTROABS 8.8*  HGB 12.5  HCT 36.6  MCV 80.3  PLT 341   Cardiac Enzymes: No results for input(s): CKTOTAL, CKMB, CKMBINDEX, TROPONINI in the last 168 hours.  BNP (last 3 results) No results for input(s): BNP in the last 8760 hours.  ProBNP (last 3 results) No results for input(s): PROBNP in the last 8760 hours.  CBG:  Recent Labs Lab 03/17/15 1737  GLUCAP 506*    Radiological Exams on Admission: No results found.    Assessment/Plan Principal Problem:   DKA, type 1 Active Problems:   Diabetic nephropathy associated with type 1 diabetes mellitus   1. DKA with Type 1 DM -will be admitted to step down unit -started on aggressive fluid hydration -will start on DKA protocol and insulin drip -will monitor Met B Q4h -will get A1C now -there does not seem to be any infectious cause so would hold off on antibiotics however cultures  Have been ordered and will be followed up  2. Diabetic Nephropathy associated with Type 1 DM -will hydrate as ordered above -follow labs  3. Nausea Vomiting -due to DKA -will treat symptomatically as needed  4. HTN -she is currently not on any treatment for hypertension -will monitor pressures  5. Sickle Cell Trait -will monitor   Code Status: Full Code (must indicate code status--if unknown or must be presumed, indicate so) DVT Prophylaxis:Heparin Family Communication: none (indicate person spoken with, if applicable, with phone number if by telephone) Disposition Plan: home (indicate anticipated LOS)  Time spent: 70min  KHAN,SAADAT A [facility] Pager [phone]    

## 2015-03-17 NOTE — ED Notes (Signed)
Bed: [id number] Expected date:  Expected time:  Means of arrival:  Comments: EMS- 24yo F, abdominal pain, n/v/d

## 2015-03-17 NOTE — ED Notes (Signed)
OSCAR RN TRANSFERRED THIS PT

## 2015-03-17 NOTE — ED Notes (Signed)
Pt [facility]- Pt c/o of BIL rib pain, BIL UQ stomach pain N/V without fever pain X 1 day. CBG 421. HX of gallstones. Emesis bile color. C/o of chest wall pain from vomiting

## 2015-03-17 NOTE — ED Provider Notes (Signed)
CSN: [id number]     Arrival date & time 03/17/15  1515 History   First MD Initiated Contact with Patient 03/17/15 1519     Chief Complaint  Patient presents with  . Abdominal Pain  . Hyperglycemia  . Nausea  . Emesis     (Consider location/radiation/quality/duration/timing/severity/associated sxs/prior Treatment) HPI Kimberly [name] is a 24 y.o. female with history of diabetes, type I, hypertension, presents to emergency department complaining of abdominal pain, nausea, vomiting. Patient states she felt nauseated yesterday, today started having left upper quadrant abdominal pain that radiates to the back. States pain is sharp. She hasn't been able to keep anything down today. She states her emesis is yellow and green in color. She denies blood in her stool and her emesis. She denies history of abdominal surgeries. She denies being pregnant. She states about a month ago she has had some blood work done and was told that her LFTs are elevated. She states she had a full workup for elevated LFTs including hepatitis panel and everything came back negative. She also states that she had ultrasound which showed gallstones in her gallbladder. She denies problems with her gallbladder since. She states her blood sugar is elevated today as well, states her glucose was in 400s today. She states it normally runs 200s. She admits to taking out all of her insulin as prescribed by her physician.  Past Medical History  Diagnosis Date  . Diabetes mellitus without complication   . Sickle cell trait   . Hypertension    Past Surgical History  Procedure Laterality Date  . Wisdom tooth extraction     Family History  Problem Relation Age of Onset  . Lupus Mother   . Aneurysm Mother    History  Substance Use Topics  . Smoking status: Never Smoker   . Smokeless tobacco: Never Used  . Alcohol Use: No   OB History    Gravida Para Term Preterm AB TAB SAB Ectopic Multiple Living   _0 Review of Systems  Constitutional: Negative for fever and chills.  Respiratory: Negative for cough, chest tightness and shortness of breath.   Cardiovascular: Negative for chest pain, palpitations and leg swelling.  Gastrointestinal: Positive for nausea, vomiting and abdominal pain. Negative for diarrhea, constipation and blood in stool.  Genitourinary: Negative for dysuria, flank pain, vaginal bleeding, vaginal discharge, vaginal pain and pelvic pain.  Musculoskeletal: Negative for myalgias, arthralgias, neck pain and neck stiffness.  Skin: Negative for rash.  Neurological: Negative for dizziness, weakness and headaches.  All other systems reviewed and are negative.     Allergies  Review of patient's allergies indicates no known allergies.  Home Medications   Prior to Admission medications   Medication Sig Start Date End Date Taking? Authorizing Provider  BAYER CONTOUR NEXT TEST test strip USE AS DIRECTED TO TEST 6 TO 8 TIMES DAILY 10/01/14  Yes Woodroe Mode, MD  Blood Glucose Monitoring Suppl (CONTOUR NEXT EZ MONITOR) W/DEVICE KIT 1 each by Does not apply route 6 (six) times daily. Provide lancets for testing six times daily. Type I DM with diabetes on Insulin Pump  DX 648.03 06/17/13  Yes Woodroe Mode, MD  etonogestrel (NEXPLANON) 68 MG IMPL implant Inject 1 each into the skin once.   Yes Historical Provider, MD  Insulin Human (INSULIN PUMP) SOLN Inject into the skin continuous. Medtronic 530 series with "bolus wizard" dispensing Novolog.  Yes Historical Provider, MD  NOVOLOG 100 UNIT/ML injection INJECT UNDER THE SKIN VIA INSULIN PUMP AS DIRECTED Patient not taking: Reported on 03/17/2015 09/30/14   Laray Anger A Anyanwu, MD   BP 147/80 mmHg  Pulse 107  Temp(Src) 97.8 F (36.6 C) (Oral)  Resp 24  SpO2 100% Physical Exam  Constitutional: She is oriented to person, place, and time. She appears well-developed and well-nourished. No distress.  HENT:  Head: Normocephalic.  Eyes:  Conjunctivae are normal.  Neck: Neck supple.  Cardiovascular: Regular rhythm and normal heart sounds.  Tachycardia present.   Pulmonary/Chest: Effort normal and breath sounds normal. No respiratory distress. She has no wheezes. She has no rales.  Abdominal: Soft. Bowel sounds are normal. She exhibits no distension. There is tenderness. There is no rebound.  LUQ tenderness, epigastric tenderenss  Musculoskeletal: She exhibits no edema.  Neurological: She is alert and oriented to person, place, and time.  Skin: Skin is warm and dry.  Psychiatric: She has a normal mood and affect. Her behavior is normal.  Nursing note and vitals reviewed.   ED Course  Procedures (including critical care time) Labs Review Labs Reviewed  CBC WITH DIFFERENTIAL/PLATELET  COMPREHENSIVE METABOLIC PANEL  LIPASE, BLOOD  URINALYSIS, ROUTINE W REFLEX MICROSCOPIC (NOT AT [facility])  I-STAT BETA HCG BLOOD, ED ([facility], [facility], [facility] ONLY)    Imaging Review No results found.   EKG Interpretation None      MDM   Final diagnoses:  Type 1 diabetes mellitus with ketoacidosis and without coma    patient with abdominal pain, nausea, vomiting. Glucose over 500. She slowly tachycardic at 107. Blood pressure is normal. Will start IV fluids, concerning for DKA. Pain medications and antiemetics ordered.  Patient's urine showing elevated and 80 ketones, bicarbonate is 8, gap is 24. Patient was placed on glucose stabilizer. She received 2 L of saline now. She continues to be tachycardic. Blood pressure remains normal. She is afebrile. Will call for admission.  Spoke with [facility] they will admit.    Jeannett Senior, PA-C 03/18/15 0147  Gareth Morgan, MD 03/18/15 [phone]

## 2015-03-17 NOTE — ED Notes (Signed)
MD at bedside. 

## 2015-03-18 ENCOUNTER — Encounter (HOSPITAL_COMMUNITY): Payer: Self-pay | Admitting: Radiology

## 2015-03-18 ENCOUNTER — Inpatient Hospital Stay (HOSPITAL_COMMUNITY): Payer: BLUE CROSS/BLUE SHIELD

## 2015-03-18 ENCOUNTER — Ambulatory Visit (HOSPITAL_COMMUNITY): Payer: BLUE CROSS/BLUE SHIELD

## 2015-03-18 DIAGNOSIS — D72829 Elevated white blood cell count, unspecified: Secondary | ICD-10-CM

## 2015-03-18 DIAGNOSIS — R112 Nausea with vomiting, unspecified: Secondary | ICD-10-CM

## 2015-03-18 DIAGNOSIS — R109 Unspecified abdominal pain: Secondary | ICD-10-CM

## 2015-03-18 DIAGNOSIS — E1010 Type 1 diabetes mellitus with ketoacidosis without coma: Principal | ICD-10-CM

## 2015-03-18 LAB — BASIC METABOLIC PANEL
ANION GAP: 17 — AB (ref 5–15)
ANION GAP: 16 — AB (ref 5–15)
Anion gap: 20 — ABNORMAL HIGH (ref 5–15)
Anion gap: 19 — ABNORMAL HIGH (ref 5–15)
Anion gap: 18 — ABNORMAL HIGH (ref 5–15)
Anion gap: 18 — ABNORMAL HIGH (ref 5–15)
Anion gap: 16 — ABNORMAL HIGH (ref 5–15)
BUN: 11 mg/dL (ref 6–20)
BUN: 9 mg/dL (ref 6–20)
BUN: 8 mg/dL (ref 6–20)
BUN: 6 mg/dL (ref 6–20)
BUN: 5 mg/dL — AB (ref 6–20)
CALCIUM: 8 mg/dL — AB (ref 8.9–10.3)
CALCIUM: 8.4 mg/dL — AB (ref 8.9–10.3)
CALCIUM: 8.2 mg/dL — AB (ref 8.9–10.3)
CHLORIDE: 116 mmol/L — AB (ref 101–111)
CHLORIDE: 115 mmol/L — AB (ref 101–111)
CHLORIDE: 116 mmol/L — AB (ref 101–111)
CHLORIDE: 112 mmol/L — AB (ref 101–111)
CHLORIDE: 115 mmol/L — AB (ref 101–111)
CO2: 6 mmol/L — AB (ref 22–32)
CO2: 6 mmol/L — ABNORMAL LOW (ref 22–32)
CO2: 8 mmol/L — ABNORMAL LOW (ref 22–32)
CO2: 6 mmol/L — ABNORMAL LOW (ref 22–32)
CO2: 9 mmol/L — ABNORMAL LOW (ref 22–32)
CO2: 10 mmol/L — ABNORMAL LOW (ref 22–32)
CO2: 9 mmol/L — ABNORMAL LOW (ref 22–32)
CREATININE: 0.98 mg/dL (ref 0.44–1.00)
CREATININE: 0.87 mg/dL (ref 0.44–1.00)
Calcium: 8.4 mg/dL — ABNORMAL LOW (ref 8.9–10.3)
Calcium: 8.5 mg/dL — ABNORMAL LOW (ref 8.9–10.3)
Calcium: 7.6 mg/dL — ABNORMAL LOW (ref 8.9–10.3)
Calcium: 8.3 mg/dL — ABNORMAL LOW (ref 8.9–10.3)
Chloride: 111 mmol/L (ref 101–111)
Chloride: 115 mmol/L — ABNORMAL HIGH (ref 101–111)
Creatinine, Ser: 0.92 mg/dL (ref 0.44–1.00)
Creatinine, Ser: 0.84 mg/dL (ref 0.44–1.00)
Creatinine, Ser: 0.79 mg/dL (ref 0.44–1.00)
Creatinine, Ser: 0.78 mg/dL (ref 0.44–1.00)
Creatinine, Ser: 0.86 mg/dL (ref 0.44–1.00)
GFR calc Af Amer: >60 mL/min (ref >60)
GFR calc Af Amer: >60 mL/min (ref >60)
GFR calc non Af Amer: >60 mL/min (ref >60)
GFR calc non Af Amer: >60 mL/min (ref >60)
GFR calc non Af Amer: >60 mL/min (ref >60)
GFR calc non Af Amer: >60 mL/min (ref >60)
GFR calc non Af Amer: >60 mL/min (ref >60)
GLUCOSE: 184 mg/dL — AB (ref 65–99)
GLUCOSE: 153 mg/dL — AB (ref 65–99)
GLUCOSE: 100 mg/dL — AB (ref 65–99)
Glucose, Bld: 345 mg/dL — ABNORMAL HIGH (ref 65–99)
Glucose, Bld: 175 mg/dL — ABNORMAL HIGH (ref 65–99)
Glucose, Bld: 174 mg/dL — ABNORMAL HIGH (ref 65–99)
Glucose, Bld: 106 mg/dL — ABNORMAL HIGH (ref 65–99)
POTASSIUM: 4.5 mmol/L (ref 3.5–5.1)
POTASSIUM: 4.2 mmol/L (ref 3.5–5.1)
POTASSIUM: 3.6 mmol/L (ref 3.5–5.1)
POTASSIUM: 3 mmol/L — AB (ref 3.5–5.1)
Potassium: 5.1 mmol/L (ref 3.5–5.1)
Potassium: 4 mmol/L (ref 3.5–5.1)
Potassium: 3.6 mmol/L (ref 3.5–5.1)
SODIUM: 137 mmol/L (ref 135–145)
SODIUM: 141 mmol/L (ref 135–145)
SODIUM: 141 mmol/L (ref 135–145)
Sodium: 140 mmol/L (ref 135–145)
Sodium: 140 mmol/L (ref 135–145)
Sodium: 139 mmol/L (ref 135–145)
Sodium: 140 mmol/L (ref 135–145)

## 2015-03-18 LAB — HEPATIC FUNCTION PANEL
ALBUMIN: 3.6 g/dL (ref 3.5–5.0)
ALT: 26 U/L (ref 14–54)
AST: 55 U/L — ABNORMAL HIGH (ref 15–41)
Alkaline Phosphatase: 133 U/L — ABNORMAL HIGH (ref 38–126)
Bilirubin, Direct: <0.1 mg/dL — ABNORMAL LOW (ref 0.1–0.5)
TOTAL PROTEIN: 7.5 g/dL (ref 6.5–8.1)
Total Bilirubin: 0.9 mg/dL (ref 0.3–1.2)

## 2015-03-18 LAB — URINALYSIS, ROUTINE W REFLEX MICROSCOPIC
Bilirubin Urine: NEGATIVE
GLUCOSE, UA: 500 mg/dL — AB
LEUKOCYTES UA: NEGATIVE
Nitrite: NEGATIVE
PROTEIN: 30 mg/dL — AB
Specific Gravity, Urine: 1.01 (ref 1.005–1.030)
UROBILINOGEN UA: 0.2 mg/dL (ref 0.0–1.0)
pH: 5 (ref 5.0–8.0)

## 2015-03-18 LAB — GLUCOSE, CAPILLARY
GLUCOSE-CAPILLARY: 280 mg/dL — AB (ref 65–99)
GLUCOSE-CAPILLARY: 174 mg/dL — AB (ref 65–99)
GLUCOSE-CAPILLARY: 154 mg/dL — AB (ref 65–99)
GLUCOSE-CAPILLARY: 174 mg/dL — AB (ref 65–99)
GLUCOSE-CAPILLARY: 188 mg/dL — AB (ref 65–99)
GLUCOSE-CAPILLARY: 99 mg/dL (ref 65–99)
GLUCOSE-CAPILLARY: 141 mg/dL — AB (ref 65–99)
GLUCOSE-CAPILLARY: 149 mg/dL — AB (ref 65–99)
Glucose-Capillary: 103 mg/dL — ABNORMAL HIGH (ref 65–99)
Glucose-Capillary: 115 mg/dL — ABNORMAL HIGH (ref 65–99)
Glucose-Capillary: 120 mg/dL — ABNORMAL HIGH (ref 65–99)
Glucose-Capillary: 129 mg/dL — ABNORMAL HIGH (ref 65–99)
Glucose-Capillary: 139 mg/dL — ABNORMAL HIGH (ref 65–99)
Glucose-Capillary: 147 mg/dL — ABNORMAL HIGH (ref 65–99)
Glucose-Capillary: 153 mg/dL — ABNORMAL HIGH (ref 65–99)
Glucose-Capillary: 175 mg/dL — ABNORMAL HIGH (ref 65–99)
Glucose-Capillary: 175 mg/dL — ABNORMAL HIGH (ref 65–99)
Glucose-Capillary: 177 mg/dL — ABNORMAL HIGH (ref 65–99)
Glucose-Capillary: 180 mg/dL — ABNORMAL HIGH (ref 65–99)
Glucose-Capillary: 207 mg/dL — ABNORMAL HIGH (ref 65–99)
Glucose-Capillary: 207 mg/dL — ABNORMAL HIGH (ref 65–99)
Glucose-Capillary: 221 mg/dL — ABNORMAL HIGH (ref 65–99)
Glucose-Capillary: 248 mg/dL — ABNORMAL HIGH (ref 65–99)
Glucose-Capillary: 309 mg/dL — ABNORMAL HIGH (ref 65–99)

## 2015-03-18 LAB — LIPASE, BLOOD: Lipase: 12 U/L — ABNORMAL LOW (ref 22–51)

## 2015-03-18 LAB — LACTIC ACID, PLASMA
LACTIC ACID, VENOUS: 5.5 mmol/L — AB (ref 0.5–2.0)
LACTIC ACID, VENOUS: 7.7 mmol/L — AB (ref 0.5–2.0)
Lactic Acid, Venous: 4.1 mmol/L (ref 0.5–2.0)
Lactic Acid, Venous: 6.6 mmol/L (ref 0.5–2.0)

## 2015-03-18 LAB — CBC WITH DIFFERENTIAL/PLATELET
BASOS ABS: 0 10*3/uL (ref 0.0–0.1)
Basophils Relative: 0 % (ref 0–1)
EOS ABS: 0 10*3/uL (ref 0.0–0.7)
EOS PCT: 0 % (ref 0–5)
HCT: 38.8 % (ref 36.0–46.0)
Hemoglobin: 13 g/dL (ref 12.0–15.0)
LYMPHS ABS: 2.2 10*3/uL (ref 0.7–4.0)
LYMPHS PCT: 13 % (ref 12–46)
MCH: 27.7 pg (ref 26.0–34.0)
MCHC: 33.5 g/dL (ref 30.0–36.0)
MCV: 82.7 fL (ref 78.0–100.0)
MONO ABS: 0.7 10*3/uL (ref 0.1–1.0)
Monocytes Relative: 4 % (ref 3–12)
NEUTROS ABS: 14 10*3/uL — AB (ref 1.7–7.7)
Neutrophils Relative %: 83 % — ABNORMAL HIGH (ref 43–77)
Platelets: 363 10*3/uL (ref 150–400)
RBC: 4.69 MIL/uL (ref 3.87–5.11)
RDW: 16.6 % — ABNORMAL HIGH (ref 11.5–15.5)
WBC: 16.9 10*3/uL — ABNORMAL HIGH (ref 4.0–10.5)

## 2015-03-18 LAB — PROTIME-INR
INR: 1.12 (ref 0.00–1.49)
Prothrombin Time: 14.6 seconds (ref 11.6–15.2)

## 2015-03-18 LAB — BLOOD GAS, ARTERIAL
Acid-base deficit: 19.3 mmol/L — ABNORMAL HIGH (ref 0.0–2.0)
Bicarbonate: 6 mEq/L — ABNORMAL LOW (ref 20.0–24.0)
Drawn by: 422461
O2 Content: 2 L/min
O2 Saturation: 98.3 %
Patient temperature: 97.9
TCO2: 5.7 mmol/L (ref 0–100)
pCO2 arterial: 12.5 mmHg — CL (ref 35.0–45.0)
pH, Arterial: 7.301 — ABNORMAL LOW (ref 7.350–7.450)
pO2, Arterial: 119 mmHg — ABNORMAL HIGH (ref 80.0–100.0)

## 2015-03-18 LAB — URINE MICROSCOPIC-ADD ON

## 2015-03-18 LAB — AMYLASE: Amylase: 30 U/L (ref 28–100)

## 2015-03-18 LAB — HIV ANTIBODY (ROUTINE TESTING W REFLEX): HIV Screen 4th Generation wRfx: NONREACTIVE

## 2015-03-18 LAB — APTT: APTT: 22 s — AB (ref 24–37)

## 2015-03-18 LAB — TROPONIN I

## 2015-03-18 LAB — PREGNANCY, URINE: PREG TEST UR: NEGATIVE

## 2015-03-18 MED ORDER — PIPERACILLIN-TAZOBACTAM 3.375 G IVPB
3.3750 g | Freq: Three times a day (TID) | INTRAVENOUS | Status: DC
  Administered 2015-03-19 – 2015-03-20 (×5): 3.375 g via INTRAVENOUS
  Filled 2015-03-18 (×5): qty 50

## 2015-03-18 MED ORDER — METOPROLOL TARTRATE 1 MG/ML IV SOLN
INTRAVENOUS | Status: AC
  Filled 2015-03-18: qty 10

## 2015-03-18 MED ORDER — METOPROLOL TARTRATE 1 MG/ML IV SOLN
10.0000 mg | Freq: Once | INTRAVENOUS | Status: AC
Start: 2015-03-18 — End: 2015-03-18
  Administered 2015-03-18: 10 mg via INTRAVENOUS

## 2015-03-18 MED ORDER — ACETAMINOPHEN 325 MG PO TABS: 650.0000 mg | ORAL_TABLET | Freq: Four times a day (QID) | ORAL | Status: DC | PRN

## 2015-03-18 MED ORDER — MORPHINE SULFATE 2 MG/ML IJ SOLN
1.0000 mg | INTRAMUSCULAR | Status: DC | PRN
  Administered 2015-03-18 – 2015-03-19 (×6): 1 mg via INTRAVENOUS
  Filled 2015-03-18 (×6): qty 1

## 2015-03-18 MED ORDER — TRAMADOL HCL 50 MG PO TABS
50.0000 mg | ORAL_TABLET | Freq: Four times a day (QID) | ORAL | Status: DC | PRN
  Administered 2015-03-18: 50 mg via ORAL
  Filled 2015-03-18 (×2): qty 1

## 2015-03-18 MED ORDER — IOHEXOL 300 MG/ML  SOLN
100.0000 mL | Freq: Once | INTRAMUSCULAR | Status: AC | PRN
  Administered 2015-03-18: 100 mL via INTRAVENOUS

## 2015-03-18 MED ORDER — LACTATED RINGERS IV BOLUS (SEPSIS)
2000.0000 mL | Freq: Once | INTRAVENOUS | Status: AC
Start: 2015-03-18 — End: 2015-03-19
  Administered 2015-03-18: 2000 mL via INTRAVENOUS

## 2015-03-18 MED ORDER — POTASSIUM CHLORIDE 10 MEQ/100ML IV SOLN
10.0000 meq | INTRAVENOUS | Status: AC
  Administered 2015-03-18 (×2): 10 meq via INTRAVENOUS
  Filled 2015-03-18 (×2): qty 100

## 2015-03-18 MED ORDER — METOCLOPRAMIDE HCL 5 MG/ML IJ SOLN
5.0000 mg | Freq: Three times a day (TID) | INTRAMUSCULAR | Status: DC | PRN
  Administered 2015-03-18 – 2015-03-19 (×3): 5 mg via INTRAVENOUS
  Filled 2015-03-18 (×3): qty 2

## 2015-03-18 MED ORDER — STERILE WATER FOR INJECTION IV SOLN
INTRAVENOUS | Status: DC
  Administered 2015-03-18: 04:00:00 via INTRAVENOUS
  Filled 2015-03-18 (×2): qty 850

## 2015-03-18 MED ORDER — PROMETHAZINE HCL 25 MG/ML IJ SOLN
12.5000 mg | Freq: Four times a day (QID) | INTRAMUSCULAR | Status: AC | PRN
  Administered 2015-03-18 (×2): 12.5 mg via INTRAVENOUS
  Filled 2015-03-18 (×2): qty 1

## 2015-03-18 MED ORDER — SODIUM CHLORIDE 0.9 % IV BOLUS (SEPSIS)
1000.0000 mL | Freq: Once | INTRAVENOUS | Status: AC
  Administered 2015-03-18: 1000 mL via INTRAVENOUS

## 2015-03-18 MED ORDER — IOHEXOL 300 MG/ML  SOLN
25.0000 mL | INTRAMUSCULAR | Status: AC
  Administered 2015-03-18: 25 mL via ORAL

## 2015-03-18 MED ORDER — ONDANSETRON HCL 4 MG/2ML IJ SOLN
4.0000 mg | Freq: Once | INTRAMUSCULAR | Status: AC
  Administered 2015-03-18: 4 mg via INTRAVENOUS
  Filled 2015-03-18: qty 2

## 2015-03-18 MED ORDER — POTASSIUM CHLORIDE 10 MEQ/100ML IV SOLN
10.0000 meq | INTRAVENOUS | Status: AC
  Administered 2015-03-18 – 2015-03-19 (×4): 10 meq via INTRAVENOUS
  Filled 2015-03-18: qty 100

## 2015-03-18 MED ORDER — FLUCONAZOLE 100 MG PO TABS
150.0000 mg | ORAL_TABLET | Freq: Once | ORAL | Status: AC
  Administered 2015-03-18: 150 mg via ORAL
  Filled 2015-03-18: qty 1.5

## 2015-03-18 MED ORDER — HYDRALAZINE HCL 20 MG/ML IJ SOLN
20.0000 mg | Freq: Four times a day (QID) | INTRAMUSCULAR | Status: DC | PRN
  Administered 2015-03-18 (×2): 20 mg via INTRAVENOUS
  Filled 2015-03-18 (×2): qty 1

## 2015-03-18 MED ORDER — ONDANSETRON HCL 4 MG/2ML IJ SOLN
4.0000 mg | Freq: Four times a day (QID) | INTRAMUSCULAR | Status: DC | PRN
  Administered 2015-03-18 – 2015-03-19 (×6): 4 mg via INTRAVENOUS
  Filled 2015-03-18 (×6): qty 2

## 2015-03-18 MED ORDER — VANCOMYCIN HCL IN DEXTROSE 1-5 GM/200ML-% IV SOLN
1000.0000 mg | Freq: Three times a day (TID) | INTRAVENOUS | Status: DC
  Administered 2015-03-18 – 2015-03-20 (×5): 1000 mg via INTRAVENOUS
  Filled 2015-03-18 (×5): qty 200

## 2015-03-18 MED ORDER — SODIUM CHLORIDE 0.9 % IV BOLUS (SEPSIS): 2000.0000 mL | Freq: Once | INTRAVENOUS | Status: DC

## 2015-03-18 MED ORDER — HYDRALAZINE HCL 20 MG/ML IJ SOLN
5.0000 mg | INTRAMUSCULAR | Status: DC | PRN
  Administered 2015-03-18: 5 mg via INTRAVENOUS
  Filled 2015-03-18: qty 1

## 2015-03-18 MED ORDER — HYDRALAZINE HCL 20 MG/ML IJ SOLN
20.0000 mg | Freq: Four times a day (QID) | INTRAMUSCULAR | Status: DC | PRN
  Administered 2015-03-18 – 2015-03-19 (×4): 20 mg via INTRAVENOUS
  Filled 2015-03-18 (×4): qty 1

## 2015-03-18 NOTE — Progress Notes (Signed)
Chaplain spoke with family member in the lobby and visited patient. Patient was asleep and chaplain did not awake patient. Chaplain will continue to follow.   03/18/15 1600  Clinical Encounter Type  Visited With Family  Visit Type Initial;Spiritual support;Social support  Referral From [facility]

## 2015-03-18 NOTE — Progress Notes (Addendum)
Shift event: pt with DKA with severe metabolic acidosis with lactate that continues to rise. Tonight, in following BMPs, her bicarb is still only 9 and gap is now 16-slow improvement. K is now low at 3, so perhaps there is some intercellular exchange. CT abd was neg today and no obvious signs of infection clinically or with labs/tests. Per chart, I&O is- IN 7400cc plus maintenance IVF at 150-200cc/hr since admission. OUT 4,000cc. Given her slow progress, this NP gave PCCM a call for a phone consult. Suggested starting empiric abx since leukocytosis, using Lactated Ringer's instead of Saline boluses for now, checking coags and LFTs. Orders placed. Additional serial Lactate ordered. BMPs q4. K being repleted. Per RN, pt wakes up easily and is oriented. Will follow.  Clance Boll, NP [facility] Addendum: Pt's LA is falling. Bicarb is rising now. Repleting K. PCCM weighing in during night with some orders. Last note per PCCM is "doubt sepsis". ? D/c antibiotics now. Will defer to attending. Pt has pending labs this am also. VSS. KJKG, NP

## 2015-03-18 NOTE — Progress Notes (Signed)
Date:  March 18, 2015 U.R. performed for needs and level of care. Will continue to follow for Case Management needs.  Velva Harman, RN, BSN, [place]   [phone]

## 2015-03-18 NOTE — Progress Notes (Signed)
HPI: Please see admission H&P.Ms. Molony has a hx of Type I DM on insulin pump and was admitted overnight by [facility] after a few hours of n/v and abdominal pain. She was found to be in DKA and was admitted to the SDU and started on aggressive IVF resuscitation and insulin drip with DKA protocol. Bicarb 8 with Anion Gap of 20.  Labs were drawn and blood cultures, urine culture, and HbA1c are still pending at time of this note.  During this shift, she has become very tachycardic and hypertensive. She apparently has a hx of HTN "when her son was born" (2015) and is on no medications for this. She says her BP is usually 120s at doctor's office. In review of chart, one visit for DKA back in 2012 here at [facility] documented intermittent HTN.  Tonight, her HR and BP have responded to Metoprolol and Hydralazine. 12 Lead EKG showed ST. Troponin neg. She has continued to have n/v at times. She has tachypnea but no hypoxia on room air. UO is good.  BMPs have shown slow improvement in her gap, now at 19. Bicarb stayed at 6 even with multiple boluses, so bicarb drip added around 0330. At this point, she has had 4300cc in and 2100cc out since admission to SDU.  Due to events above, more labs were added. R/p CBC still with leukocytosis (16-18000) which was normal on admission. Pt fever 99.87F once which has decreased. UA with + ketones and yeast. Lipase and amylase added for abd pain and hx of gallstones per pt. Lactate 5.5.  At this point, given above, have suspicion of an infection contributing to and/or causing the DKA. Discussed case in detail with Dr. Claria Dice of [facility] who agrees to plan of checking CT abd/pelvis.   Pt's endocrinologist is Dr. Chalmers Cater.   Subjective: Feels horrible. + n/v. Last BM 7/18. No diarrhea. + upper abdominal pain started at home. Mild low back pain. No chest pain. Feels a little SOB. No cough or sputum production. No fever at home of which she is aware. Says she recently had a negative  workup for elevated liver enzymes.  Objective: Vital signs in last 24 hours: Temp:  [97.7 F (36.5 C)-99.9 F (37.7 C)] 99.9 F (37.7 C) (07/20 0400) Pulse Rate:  [104-151] 104 (07/20 0500) Resp:  [16-31] 23 (07/20 0500) BP: (96-192)/(50-109) 120/74 mmHg (07/20 0500) SpO2:  [100 %] 100 % (07/20 0500) Weight:  [81.194 kg (179 lb)] 81.194 kg (179 lb) (07/19 1735) Weight change:     Intake/Output from previous day: 07/19 0701 - 07/20 0700 In: 4288.8 [I.V.:2088.8; IV Piggyback:2200] Out: 2100 [Urine:2100] Intake/Output this shift: Total I/O In: 3288.8 [I.V.:1088.8; IV Piggyback:2200] Out: 1500 [Urine:1500]  PE: Poor appearing, acutely ill, young AAF in no distress. Appears fatigued. Alert and oriented.  Card: S1S2, ST. Lungs: CTA with good air exchange. Appears to be hyperventilating. SaO2 100% on RA. Abd: BS + x 4 quads. Tender to RUQ but no Murphy's sign noted. No rebound or guarding. Neuro: no focal deficits noted.   Lab Results:  Recent Labs  03/17/15 2130 03/18/15 0355  WBC 17.6* 16.9*  HGB 12.3 13.0  HCT 35.8* 38.8  PLT 325 363   BMET  Recent Labs  03/18/15 0123 03/18/15 0355  NA 137 141  K 5.1 4.5  CL 111 116*  CO2 6* 6*  GLUCOSE 345* 175*  BUN 11 9  CREATININE 0.92 0.98  CALCIUM 8.4* 8.5*    Assessment/Plan: 1. DKA with severe  metabolic acidosis-continue aggressive fluid resuscitation. Continue Bicarb drip. BMPs changed to q2 hours until more stable. Continue insulin drip and glucomander. Will need diabetic consultant input for transitioning back to insulin pump.   2. Hypertension-continue Hydralazine prn. When able to take po's, may need med started. EKG OK except ST. Troponin neg.  3. Tachycardia-compensating for acute illness. Was febrile. HR was in the 140s, now 100s. Will follow.  4. Metabolic acidosis-Follow lactate q3hrs. IVF and Bicarb. Checked ABG which confirmed.  5. Leukocytosis with ? Infection. Check HIV. UA neg except yeast. Blood cx  pending. Urine cx pending. Trend CBCs. Follow fever curve. Will hold abx for now. Stat CT abd and pelvis. Further plan after test.  6. Candida-Diflucan x i.  7. Abdominal pain-may just be the DKA, but given other factors, will check Amylase, Lipase and CT. 8. Hx non compliance with meds.     LOS: 1 day   KIRBY-GRAHAM, Adarian Bur 03/18/2015, 5:43 AM

## 2015-03-18 NOTE — Progress Notes (Signed)
[facility] PROGRESS NOTE  Kimberly [name] [id number] DOB: 05/19/1991 DOA: 03/17/2015 PCP: Foye Spurling, MD  Assessment/Plan: Principal Problem:   DKA, type 1 -Patient is currently on insulin drip. We'll plan on continuing until there is improvement in acidosis. -Continue BMP every 4 hours. While on insulin drip - Continue supportive therapy - PH on arterial blood gas 7.3 as such will discontinue sodium bicarbonate drip  Abdominal discomfort -I suspect that this is secondary to increased bouts of emesis secondary to acidosis. - Continue supportive therapy with pain medications and anti-medics. -Obtain CT scan of abdomen  Nausea and vomiting -Secondary to acidosis. We'll continue insulin drip and continue anti-medics as needed.  Leukocytosis -Currently working up 1 Will obtain CT scan of abdomen as mentioned above. -No fevers within a 24-hour period - Follow-up with urine and blood culture. - Not currently on antibiotics. If patient spikes fever will plan on covering currently no clear source of   Active Problems:   Diabetic nephropathy associated with type 1 diabetes mellitus - Serum creatinine 0.84 on last check  Code Status: Full Family Communication: No family at bedside Disposition Plan: Stepdown unit while on insulin drip   Consultants:  None  Procedures:  None  Antibiotics:  None  HPI/Subjective: Patient states she feels about the same. States that her discomfort comes in waves.  Objective: Filed Vitals:   03/18/15 0735  BP:   Pulse:   Temp: 99.3 F (37.4 C)  Resp:     Intake/Output Summary (Last 24 hours) at 03/18/15 0839 Last data filed at 03/18/15 0600  Gross per 24 hour  Intake 4515.66 ml  Output   2500 ml  Net 2015.66 ml   Filed Weights   03/17/15 1735  Weight: 81.194 kg (179 lb)    Exam:   General:  Patient in no acute distress, awake and alert  Cardiovascular: Regular rate and rhythm, no murmurs or  rubs  Respiratory: Clear to auscultation bilaterally, equal chest rise, no wheezes  Abdomen: Soft, nondistended, generalized discomfort with deep palpation, no guarding, no rebound tenderness  Musculoskeletal: No cyanosis or clubbing   Data Reviewed: Basic Metabolic Panel:  Recent Labs Lab 03/17/15 1600 03/17/15 2130 03/18/15 0123 03/18/15 0355 03/18/15 0550  NA 136 140 137 141 140  K 4.9 4.8 5.1 4.5 4.2  CL 104 114* 111 116* 115*  CO2 8* 6* 6* 6* 8*  GLUCOSE 501* 169* 345* 175* 184*  BUN 11 11 11 9 8   CREATININE 1.02* 0.98 0.92 0.98 0.84  CALCIUM 9.4 8.6* 8.4* 8.5* 7.6*  MG  --  1.9  --   --   --   PHOS  --  3.9  --   --   --    Liver Function Tests:  Recent Labs Lab 03/17/15 1600  AST 44*  ALT 23  ALKPHOS 164*  BILITOT 2.0*  PROT 8.8*  ALBUMIN 4.3    Recent Labs Lab 03/17/15 1600 03/18/15 0600  LIPASE 14* 12*  AMYLASE  --  30   No results for input(s): AMMONIA in the last 168 hours. CBC:  Recent Labs Lab 03/17/15 1600 03/17/15 2130 03/18/15 0355  WBC 10.3 17.6* 16.9*  NEUTROABS 8.8*  --  14.0*  HGB 12.5 12.3 13.0  HCT 36.6 35.8* 38.8  MCV 80.3 81.4 82.7  PLT 341 325 363   Cardiac Enzymes:  Recent Labs Lab 03/18/15 0355  TROPONINI <0.03   BNP (last 3 results) No results for input(s): BNP in the last 8760  hours.  ProBNP (last 3 results) No results for input(s): PROBNP in the last 8760 hours.  CBG:  Recent Labs Lab 03/17/15 2052 03/17/15 2202 03/17/15 2305 03/18/15 0006 03/18/15 0159  GLUCAP 260* 109* 120* 180* 280*    Recent Results (from the past 240 hour(s))  MRSA PCR Screening     Status: None   Collection Time: 03/17/15  6:51 PM  Result Value Ref Range Status   MRSA by PCR NEGATIVE NEGATIVE Final    Comment:        The GeneXpert MRSA Assay (FDA approved for NASAL specimens only), is one component of a comprehensive MRSA colonization surveillance program. It is not intended to diagnose MRSA infection nor to guide  or monitor treatment for MRSA infections.      Studies: No results found.  Scheduled Meds: . heparin  5,000 Units Subcutaneous 3 times per day  . pneumococcal 23 valent vaccine  0.5 mL Intramuscular Tomorrow-1000   Continuous Infusions: . sodium chloride Stopped (03/17/15 2204)  . dextrose 5 % and 0.45% NaCl 150 mL/hr at 03/18/15 0832  . insulin (NOVOLIN-R) infusion 2.3 Units/hr (03/18/15 0733)    Time spent: > 35 minutes  Velvet Bathe  [facility] Pager [phone] If 7PM-7AM, please contact night-coverage at [email or web address], password [facility] 03/18/2015, 8:39 AM  LOS: 1 day

## 2015-03-18 NOTE — Progress Notes (Signed)
eLink Physician-Brief Progress Note Patient Name: Kimberly [name] DOB: 1990/10/14 MRN: [id number]   Date of Service  03/18/2015  HPI/Events of Note  Case discussed in brief with [facility] NP. Rising Lactic & metabolic acidosis. Still has anion gap. Patient oriented & hemodynamically stable.  eICU Interventions  None. Continuing to follow electrolytes, LFTs, & LA. Switch to LR for fluids.     Intervention Category Minor Interventions: Communication with other healthcare providers and/or family  Tera Partridge 03/18/2015, 10:41 PM

## 2015-03-18 NOTE — Progress Notes (Signed)
Nutrition Brief Note  Patient identified on the Malnutrition Screening Tool (MST) Report.  Pt reports that the last time she was able to keep down food and drink was Wednesday (03/11/15). She states before this current acute illness she had a good appetite which she reports as "normal." She indicates that she did have episodes of emesis this AM. She states UBW of 157 lbs. This is not consistent with weight hx review.  Wt Readings from Last 15 Encounters:  03/17/15 179 lb (81.194 kg)  12/06/13 179 lb 4.8 oz (81.33 kg)  10/25/13 208 lb (94.348 kg)  10/21/13 209 lb 12 oz (95.142 kg)  10/14/13 202 lb (91.627 kg)  10/07/13 202 lb (91.627 kg)  10/07/13 200 lb 14.4 oz (91.128 kg)  10/01/13 196 lb 8 oz (89.132 kg)  09/30/13 192 lb 11.2 oz (87.408 kg)  09/23/13 193 lb 12 oz (87.884 kg)  09/23/13 192 lb 11.2 oz (87.408 kg)  09/17/13 193 lb (87.544 kg)  09/16/13 190 lb 4.8 oz (86.32 kg)  09/02/13 190 lb 9.6 oz (86.456 kg)  08/26/13 186 lb 8 oz (84.596 kg)    Body mass index is 29.79 kg/(m^2). Patient meets criteria for overweight status based on current BMI.   Current diet order is NPO. Labs and medications reviewed.   No nutrition interventions warranted at this time. If nutrition issues arise, please consult RD.     Jarome Matin, RD, LDN Inpatient Clinical Dietitian Pager # [phone] After hours/weekend pager # [phone]

## 2015-03-18 NOTE — Care Management Note (Signed)
Case Management Note  Patient Details  Name: Kimberly [name] MRN: [id number] Date of Birth: 1990/09/26  Subjective/Objective:                 dka   Action/Plan: home   Expected Discharge Date:   (unknown)       [id number]        Expected Discharge Plan:  Home/Self Care  In-House Referral:  NA  Discharge planning Services  CM Consult  Post Acute Care Choice:  NA Choice offered to:  NA  DME Arranged:  N/A DME Agency:  NA  HH Arranged:  NA HH Agency:  NA  Status of Service:  In process, will continue to follow  Medicare Important Message Given:    Date Medicare IM Given:    Medicare IM give by:    Date Additional Medicare IM Given:    Additional Medicare Important Message give by:     If discussed at [place] of Stay Meetings, dates discussed:    Additional Comments:  Leeroy Cha, RN 03/18/2015, 9:47 AM

## 2015-03-18 NOTE — Progress Notes (Signed)
CRITICAL VALUE ALERT  Critical value received:  Lactic acid 4.1  Date of notification:  03/18/2015  Time of notification:  0906  Critical value read back:Yes.    Nurse who received alert:  Dorrene German, RN  MD notified (1st page):  Velvet Bathe, MD  Time of first page: [phone]  MD notified (2nd page):  Time of second page:  Responding MD:  Velvet Bathe, MD  Time MD responded:  [phone]

## 2015-03-18 NOTE — Progress Notes (Signed)
MD notified due to patient being hypertensive, tachypnic and extremely tachycardic. ECG preformed. Orders placed for fluids and other interventions.

## 2015-03-18 NOTE — Progress Notes (Signed)
CRITICAL VALUE ALERT  Critical value received:  Lactic acid 7.7  Date of notification:  7/20  Time of notification:  2100  Critical value read back:Yes.    Nurse who received alert:  Darrin Nipper, RN  MD notified (1st page):  [facility] on call

## 2015-03-18 NOTE — Progress Notes (Signed)
Inpatient Diabetes Program Recommendations  AACE/ADA: New Consensus Statement on Inpatient Glycemic Control (2013)  Target Ranges:  Prepandial:   less than 140 mg/dL      Peak postprandial:   less than 180 mg/dL (1-2 hours)      Critically ill patients:  140 - 180 mg/dL   Reason for Visit: DKA  Diabetes history: DM1 Outpatient Diabetes medications: Insulin Pump Current orders for Inpatient glycemic control: IV insulin per GlucoStabilizer  Results for [name], [name] (MRN [id number]) as of 03/18/2015 16:11  Ref. Range 03/18/2015 11:30  Sodium Latest Ref Range: 135-145 mmol/L 139  Potassium Latest Ref Range: 3.5-5.1 mmol/L 4.0  Chloride Latest Ref Range: 101-111 mmol/L 112 (H)  CO2 Latest Ref Range: 22-32 mmol/L 9 (L)  BUN Latest Ref Range: 6-20 mg/dL 5 (L)  Creatinine Latest Ref Range: 0.44-1.00 mg/dL 0.78  Calcium Latest Ref Range: 8.9-10.3 mg/dL 8.4 (L)  EGFR (Non-African Amer.) Latest Ref Range: >60 mL/min >60  EGFR (African American) Latest Ref Range: >60 mL/min >60  Glucose Latest Ref Range: 65-99 mg/dL 153 (H)  Anion gap Latest Ref Range: 5-15  18 (H)    Results for [name], [name] (MRN [id number]) as of 03/18/2015 16:11  Ref. Range 03/18/2015 07:31 03/18/2015 09:38 03/18/2015 10:42 03/18/2015 11:46 03/18/2015 12:59  Glucose-Capillary Latest Ref Range: 65-99 mg/dL 174 (H) 174 (H) 175 (H) 147 (H) 139 (H)  Results for [name], [name] (MRN [id number]) as of 03/18/2015 16:11  Ref. Range 03/18/2015 01:23 03/18/2015 03:55 03/18/2015 05:50 03/18/2015 08:13 03/18/2015 11:30  Glucose Latest Ref Range: 65-99 mg/dL 345 (H) 175 (H) 184 (H) 174 (H) 153 (H)  Results for [name], [name] (MRN [id number]) as of 03/18/2015 16:11  Ref. Range 03/18/2015 11:30  Sodium Latest Ref Range: 135-145 mmol/L 139  Potassium Latest Ref Range: 3.5-5.1 mmol/L 4.0  Chloride Latest Ref Range: 101-111 mmol/L 112 (H)  CO2 Latest Ref Range: 22-32 mmol/L 9 (L)  BUN Latest Ref Range: 6-20 mg/dL 5 (L)  Creatinine  Latest Ref Range: 0.44-1.00 mg/dL 0.78  Calcium Latest Ref Range: 8.9-10.3 mg/dL 8.4 (L)  EGFR (Non-African Amer.) Latest Ref Range: >60 mL/min >60  EGFR (African American) Latest Ref Range: >60 mL/min >60  Glucose Latest Ref Range: 65-99 mg/dL 153 (H)  Anion gap Latest Ref Range: 5-15  18 (H)   AG - 18 and CO2 - 9. Not ready to transition to SQ or pump.  Recommendations: Continue with DKA order set with IV insulin. When criteria met for transition, give Lantus 1-2 hours prior to discontinuation of drip.  Pt will need to do safety check with insulin pump (call 1-800 number on pump) prior to restarting. Must have all of supplies.  Will follow-up in am.  Thank you. Lorenda Peck, RD, LDN, CDE Inpatient Diabetes Coordinator [phone]

## 2015-03-18 NOTE — Progress Notes (Signed)
ANTIBIOTIC CONSULT NOTE - INITIAL  Pharmacy Consult for vanc and zosyn Indication: sepsis  No Known Allergies  Patient Measurements: Height: 5\' 5"  (165.1 cm) Weight: 179 lb (81.194 kg) IBW/kg (Calculated) : 57   Vital Signs: Temp: 99.3 F (37.4 C) (07/20 2000) Temp Source: Oral (07/20 2000) BP: 149/59 mmHg (07/20 2100) Pulse Rate: 126 (07/20 2100) Intake/Output from previous day: 07/19 0701 - 07/20 0700 In: 4515.7 [I.V.:2315.7; IV Piggyback:2200] Out: 2500 [Urine:2500] Intake/Output from this shift: Total I/O In: 105.5 [I.V.:5.5; IV Piggyback:100] Out: -   Labs:  Recent Labs  03/17/15 1600 03/17/15 2130  03/18/15 0355  03/18/15 1130 03/18/15 1620 03/18/15 2026  WBC 10.3 17.6*  --  16.9*  --   --   --   --   HGB 12.5 12.3  --  13.0  --   --   --   --   PLT 341 325  --  363  --   --   --   --   CREATININE 1.02* 0.98  < > 0.98  < > 0.78 0.87 0.86  < > = values in this interval not displayed. Estimated Creatinine Clearance: 104.4 mL/min (by C-G formula based on Cr of 0.86). No results for input(s): VANCOTROUGH, VANCOPEAK, VANCORANDOM, GENTTROUGH, GENTPEAK, GENTRANDOM, TOBRATROUGH, TOBRAPEAK, TOBRARND, AMIKACINPEAK, AMIKACINTROU, AMIKACIN in the last 72 hours.   Microbiology: Recent Results (from the past 720 hour(s))  MRSA PCR Screening     Status: None   Collection Time: 03/17/15  6:51 PM  Result Value Ref Range Status   MRSA by PCR NEGATIVE NEGATIVE Final    Comment:        The GeneXpert MRSA Assay (FDA approved for NASAL specimens only), is one component of a comprehensive MRSA colonization surveillance program. It is not intended to diagnose MRSA infection nor to guide or monitor treatment for MRSA infections.   Culture, blood (routine x 2)     Status: None (Preliminary result)   Collection Time: 03/17/15  9:55 PM  Result Value Ref Range Status   Specimen Description BLOOD LEFT HAND  Final   Special Requests PEDIATRICS 4ML  Final   Culture   Final     NO GROWTH < 24 HOURS Performed at [facility] ([facility]    Report Status PENDING  Incomplete    Medical History: Past Medical History  Diagnosis Date  . Sickle cell trait   . Hypertension   . Diabetes mellitus without complication     Medications:  Scheduled:  . heparin  5,000 Units Subcutaneous 3 times per day  . lactated ringers  2,000 mL Intravenous Once  . pneumococcal 23 valent vaccine  0.5 mL Intramuscular Tomorrow-1000  . potassium chloride  10 mEq Intravenous Q1 Hr x 4   Assessment: 24 y.o. female with history of Type I diabetes presents with nausea and vomiting and DKA. Pharmacy consulted to dose vancomycin and zosyn for sepsis.  Goal of Therapy:  Vancomycin trough level 15-20 mcg/ml vanc and zosyn per renal function  Plan:  Vancomycin 1gm IV q8h Zosyn 3.375mg  IV q8h extended interval Follow up renal function, cultures vanc trough at steady state   Dolly Rias RPh 03/18/2015, 10:04 PM Pager [phone]

## 2015-03-18 NOTE — Progress Notes (Signed)
CRITICAL VALUE ALERT  Critical value received: Lactic acid 6.6  Date of notification:  03/18/2015  Time of notification:  [id number]  Critical value read back:Yes.    Nurse who received alert:  Dorrene German, RN  MD notified (1st page):  Hillary Bow  Time of first page:  [phone]  MD notified (2nd page):  Time of second page:  Responding MD:  Hillary Bow  Time MD responded:  [phone]

## 2015-03-19 ENCOUNTER — Inpatient Hospital Stay (HOSPITAL_COMMUNITY): Payer: BLUE CROSS/BLUE SHIELD

## 2015-03-19 DIAGNOSIS — I10 Essential (primary) hypertension: Secondary | ICD-10-CM

## 2015-03-19 LAB — GLUCOSE, CAPILLARY
GLUCOSE-CAPILLARY: 225 mg/dL — AB (ref 65–99)
GLUCOSE-CAPILLARY: 104 mg/dL — AB (ref 65–99)
GLUCOSE-CAPILLARY: 148 mg/dL — AB (ref 65–99)
GLUCOSE-CAPILLARY: 201 mg/dL — AB (ref 65–99)
GLUCOSE-CAPILLARY: 194 mg/dL — AB (ref 65–99)
GLUCOSE-CAPILLARY: 124 mg/dL — AB (ref 65–99)
GLUCOSE-CAPILLARY: 210 mg/dL — AB (ref 65–99)
GLUCOSE-CAPILLARY: 158 mg/dL — AB (ref 65–99)
GLUCOSE-CAPILLARY: 216 mg/dL — AB (ref 65–99)
Glucose-Capillary: 104 mg/dL — ABNORMAL HIGH (ref 65–99)
Glucose-Capillary: 115 mg/dL — ABNORMAL HIGH (ref 65–99)
Glucose-Capillary: 118 mg/dL — ABNORMAL HIGH (ref 65–99)
Glucose-Capillary: 122 mg/dL — ABNORMAL HIGH (ref 65–99)
Glucose-Capillary: 151 mg/dL — ABNORMAL HIGH (ref 65–99)
Glucose-Capillary: 152 mg/dL — ABNORMAL HIGH (ref 65–99)
Glucose-Capillary: 229 mg/dL — ABNORMAL HIGH (ref 65–99)
Glucose-Capillary: 243 mg/dL — ABNORMAL HIGH (ref 65–99)
Glucose-Capillary: 97 mg/dL (ref 65–99)

## 2015-03-19 LAB — BASIC METABOLIC PANEL
ANION GAP: 13 (ref 5–15)
Anion gap: 16 — ABNORMAL HIGH (ref 5–15)
Anion gap: 15 (ref 5–15)
Anion gap: 16 — ABNORMAL HIGH (ref 5–15)
Anion gap: 13 (ref 5–15)
Anion gap: 14 (ref 5–15)
BUN: <5 mg/dL — ABNORMAL LOW (ref 6–20)
BUN: <5 mg/dL — ABNORMAL LOW (ref 6–20)
BUN: <5 mg/dL — ABNORMAL LOW (ref 6–20)
BUN: <5 mg/dL — ABNORMAL LOW (ref 6–20)
BUN: <5 mg/dL — ABNORMAL LOW (ref 6–20)
CALCIUM: 7.9 mg/dL — AB (ref 8.9–10.3)
CALCIUM: 7.6 mg/dL — AB (ref 8.9–10.3)
CHLORIDE: 110 mmol/L (ref 101–111)
CHLORIDE: 113 mmol/L — AB (ref 101–111)
CHLORIDE: 110 mmol/L (ref 101–111)
CHLORIDE: 108 mmol/L (ref 101–111)
CO2: 11 mmol/L — AB (ref 22–32)
CO2: 9 mmol/L — ABNORMAL LOW (ref 22–32)
CO2: 9 mmol/L — AB (ref 22–32)
CO2: 12 mmol/L — ABNORMAL LOW (ref 22–32)
CO2: 9 mmol/L — ABNORMAL LOW (ref 22–32)
CO2: 13 mmol/L — ABNORMAL LOW (ref 22–32)
CREATININE: 0.72 mg/dL (ref 0.44–1.00)
Calcium: 8.3 mg/dL — ABNORMAL LOW (ref 8.9–10.3)
Calcium: 7.5 mg/dL — ABNORMAL LOW (ref 8.9–10.3)
Calcium: 8.1 mg/dL — ABNORMAL LOW (ref 8.9–10.3)
Calcium: 7.9 mg/dL — ABNORMAL LOW (ref 8.9–10.3)
Chloride: 110 mmol/L (ref 101–111)
Chloride: 110 mmol/L (ref 101–111)
Creatinine, Ser: 0.65 mg/dL (ref 0.44–1.00)
Creatinine, Ser: 0.73 mg/dL (ref 0.44–1.00)
Creatinine, Ser: 0.76 mg/dL (ref 0.44–1.00)
Creatinine, Ser: 0.86 mg/dL (ref 0.44–1.00)
Creatinine, Ser: 0.71 mg/dL (ref 0.44–1.00)
GFR calc Af Amer: >60 mL/min (ref >60)
GFR calc Af Amer: >60 mL/min (ref >60)
GFR calc Af Amer: >60 mL/min (ref >60)
GFR calc Af Amer: >60 mL/min (ref >60)
GFR calc Af Amer: >60 mL/min (ref >60)
GFR calc Af Amer: >60 mL/min (ref >60)
GFR calc non Af Amer: >60 mL/min (ref >60)
GFR calc non Af Amer: >60 mL/min (ref >60)
GFR calc non Af Amer: >60 mL/min (ref >60)
GFR calc non Af Amer: >60 mL/min (ref >60)
GLUCOSE: 170 mg/dL — AB (ref 65–99)
GLUCOSE: 213 mg/dL — AB (ref 65–99)
Glucose, Bld: 209 mg/dL — ABNORMAL HIGH (ref 65–99)
Glucose, Bld: 196 mg/dL — ABNORMAL HIGH (ref 65–99)
Glucose, Bld: 165 mg/dL — ABNORMAL HIGH (ref 65–99)
Glucose, Bld: 210 mg/dL — ABNORMAL HIGH (ref 65–99)
POTASSIUM: 3.9 mmol/L (ref 3.5–5.1)
Potassium: 3.3 mmol/L — ABNORMAL LOW (ref 3.5–5.1)
Potassium: 3 mmol/L — ABNORMAL LOW (ref 3.5–5.1)
Potassium: 3.4 mmol/L — ABNORMAL LOW (ref 3.5–5.1)
Potassium: 2.8 mmol/L — ABNORMAL LOW (ref 3.5–5.1)
Potassium: 3 mmol/L — ABNORMAL LOW (ref 3.5–5.1)
SODIUM: 137 mmol/L (ref 135–145)
SODIUM: 137 mmol/L (ref 135–145)
SODIUM: 135 mmol/L (ref 135–145)
Sodium: 135 mmol/L (ref 135–145)
Sodium: 133 mmol/L — ABNORMAL LOW (ref 135–145)
Sodium: 134 mmol/L — ABNORMAL LOW (ref 135–145)

## 2015-03-19 LAB — HEPATIC FUNCTION PANEL
ALT: 21 U/L (ref 14–54)
AST: 34 U/L (ref 15–41)
Albumin: 2.8 g/dL — ABNORMAL LOW (ref 3.5–5.0)
Alkaline Phosphatase: 117 U/L (ref 38–126)
BILIRUBIN INDIRECT: 0.6 mg/dL (ref 0.3–0.9)
Bilirubin, Direct: 0.2 mg/dL (ref 0.1–0.5)
Total Bilirubin: 0.8 mg/dL (ref 0.3–1.2)
Total Protein: 6 g/dL — ABNORMAL LOW (ref 6.5–8.1)

## 2015-03-19 LAB — MAGNESIUM: Magnesium: 1.3 mg/dL — ABNORMAL LOW (ref 1.7–2.4)

## 2015-03-19 LAB — BLOOD GAS, ARTERIAL
Acid-base deficit: 10.2 mmol/L — ABNORMAL HIGH (ref 0.0–2.0)
Bicarbonate: 12.9 mEq/L — ABNORMAL LOW (ref 20.0–24.0)
Drawn by: 422461
O2 CONTENT: 2 L/min
O2 Saturation: 99.3 %
PATIENT TEMPERATURE: 98.9
TCO2: 12 mmol/L (ref 0–100)
pCO2 arterial: 21.5 mmHg — ABNORMAL LOW (ref 35.0–45.0)
pH, Arterial: 7.397 (ref 7.350–7.450)
pO2, Arterial: 140 mmHg — ABNORMAL HIGH (ref 80.0–100.0)

## 2015-03-19 LAB — AMYLASE: AMYLASE: 49 U/L (ref 28–100)

## 2015-03-19 LAB — HEMOGLOBIN A1C
Hgb A1c MFr Bld: 9.5 % — ABNORMAL HIGH (ref 4.8–5.6)
Mean Plasma Glucose: 226 mg/dL

## 2015-03-19 LAB — URINE CULTURE: Culture: NO GROWTH

## 2015-03-19 LAB — LIPASE, BLOOD: Lipase: 25 U/L (ref 22–51)

## 2015-03-19 LAB — LACTIC ACID, PLASMA: Lactic Acid, Venous: 3.8 mmol/L (ref 0.5–2.0)

## 2015-03-19 MED ORDER — HYDRALAZINE HCL 20 MG/ML IJ SOLN
20.0000 mg | INTRAMUSCULAR | Status: DC | PRN
  Administered 2015-03-19 – 2015-03-20 (×3): 20 mg via INTRAVENOUS
  Filled 2015-03-19 (×3): qty 1

## 2015-03-19 MED ORDER — POTASSIUM CHLORIDE 10 MEQ/100ML IV SOLN: 10.0000 meq | INTRAVENOUS | Status: DC

## 2015-03-19 MED ORDER — LACTATED RINGERS IV BOLUS (SEPSIS)
1000.0000 mL | Freq: Once | INTRAVENOUS | Status: AC
  Administered 2015-03-19: 1000 mL via INTRAVENOUS

## 2015-03-19 MED ORDER — DEXTROSE IN LACTATED RINGERS 5 % IV SOLN
INTRAVENOUS | Status: DC
  Administered 2015-03-19 – 2015-03-20 (×2): via INTRAVENOUS

## 2015-03-19 MED ORDER — PROMETHAZINE HCL 25 MG RE SUPP
25.0000 mg | Freq: Four times a day (QID) | RECTAL | Status: DC | PRN
  Administered 2015-03-19 – 2015-03-20 (×3): 25 mg via RECTAL
  Filled 2015-03-19 (×3): qty 1

## 2015-03-19 MED ORDER — POTASSIUM CHLORIDE CRYS ER 20 MEQ PO TBCR
40.0000 meq | EXTENDED_RELEASE_TABLET | Freq: Once | ORAL | Status: AC
  Administered 2015-03-19: 40 meq via ORAL
  Filled 2015-03-19: qty 2

## 2015-03-19 MED ORDER — POTASSIUM CHLORIDE 10 MEQ/100ML IV SOLN
10.0000 meq | INTRAVENOUS | Status: AC
  Administered 2015-03-19 – 2015-03-20 (×6): 10 meq via INTRAVENOUS
  Filled 2015-03-19 (×5): qty 100

## 2015-03-19 MED ORDER — SODIUM CHLORIDE 0.9 % IV BOLUS (SEPSIS)
750.0000 mL | Freq: Once | INTRAVENOUS | Status: AC
  Administered 2015-03-19: 750 mL via INTRAVENOUS

## 2015-03-19 MED ORDER — SODIUM CHLORIDE 0.9 % IV BOLUS (SEPSIS): 1000.0000 mL | Freq: Once | INTRAVENOUS | Status: DC

## 2015-03-19 MED ORDER — POTASSIUM CHLORIDE 10 MEQ/100ML IV SOLN
10.0000 meq | INTRAVENOUS | Status: AC
  Administered 2015-03-19 (×3): 10 meq via INTRAVENOUS
  Filled 2015-03-19 (×3): qty 100

## 2015-03-19 MED ORDER — POTASSIUM CHLORIDE 10 MEQ/100ML IV SOLN
10.0000 meq | INTRAVENOUS | Status: AC
  Administered 2015-03-19 (×4): 10 meq via INTRAVENOUS
  Filled 2015-03-19 (×4): qty 100

## 2015-03-19 MED ORDER — MORPHINE SULFATE 2 MG/ML IJ SOLN
2.0000 mg | INTRAMUSCULAR | Status: DC | PRN
  Administered 2015-03-19 – 2015-03-20 (×5): 2 mg via INTRAVENOUS
  Filled 2015-03-19 (×5): qty 1

## 2015-03-19 NOTE — Progress Notes (Signed)
eLink Physician-Brief Progress Note Patient Name: Kimberly [name] DOB: 1990/10/28 MRN: [id number]   Date of Service  03/19/2015  HPI/Events of Note  RN notified that pt having "chest pain" feels like someone "sitting on her chest" & different from prior pain. Pt also notes this pain in her LUQ. Continues to have some nausea. No dyspnea or pleurisy. No back pain. Trachycardic & hypertensive. EKG shows sinus tachycardia.  eICU Interventions  Hepatic Function Panel, Amylase, & Lipase stat. [place] O2. RN to notify hospitalist service.     Intervention Category Intermediate Interventions: Pain - evaluation and management  Tera Partridge 03/19/2015, 7:34 PM

## 2015-03-19 NOTE — Progress Notes (Signed)
CRITICAL VALUE ALERT  Critical value received:  Lactic acid 3.8  Date of notification:  7/21  Time of notification:  [id number]  Critical value read back:Yes.    Nurse who received alert:  Darrin Nipper, RN  MD notified (1st page):  [facility] on call

## 2015-03-19 NOTE — Progress Notes (Signed)
[facility] PROGRESS NOTE  ANDEE FITTS [id number] DOB: 06/01/1991 DOA: 03/17/2015 PCP: Foye Spurling, MD  Assessment/Plan: Principal Problem:   DKA, type 1 -Patient is currently on insulin drip. We'll plan on continuing until there is improvement in acidosis. Only minimal improvement with CO2 at 9. Will replace K and continue insulin gtt. Continue fluids and bolus with Lactated ringers. -Continue BMP every 4 hours. While on insulin drip - Continue supportive therapy  Abdominal discomfort -I suspect that this is secondary to increased bouts of emesis secondary to acidosis. - Continue supportive therapy with pain medications and anti-emetics. -CT scan of abdomen reports no small bowel obstruction.  Nausea and vomiting -Secondary to acidosis. - Continue current insulin gtt management and supportive therapy.  Leukocytosis -Antibiotics added overnight. Patient afebrile and no clear source of infection. Low threshold to discontinue.  Active Problems:   Diabetic nephropathy associated with type 1 diabetes mellitus - Serum creatinine 0.84 on last check  Code Status: Full Family Communication: No family at bedside Disposition Plan: Stepdown unit while on insulin drip   Consultants:  PCCM  Procedures:  None  Antibiotics: Vanc and Zosyn  HPI/Subjective: Patient states she feels better today.  Objective: Filed Vitals:   03/19/15 0800  BP:   Pulse:   Temp: 98.6 F (37 C)  Resp:     Intake/Output Summary (Last 24 hours) at 03/19/15 0927 Last data filed at 03/19/15 0620  Gross per 24 hour  Intake 4988.47 ml  Output   2052 ml  Net 2936.47 ml   Filed Weights   03/17/15 1735  Weight: 81.194 kg (179 lb)    Exam:   General:  Patient in no acute distress, awake and alert  Cardiovascular: Regular rate and rhythm, no murmurs or rubs  Respiratory: Clear to auscultation bilaterally, equal chest rise, no wheezes  Abdomen: Soft, nondistended,  generalized discomfort with deep palpation, no guarding, no rebound tenderness  Musculoskeletal: No cyanosis or clubbing   Data Reviewed: Basic Metabolic Panel:  Recent Labs Lab 03/17/15 2130  03/18/15 1620 03/18/15 2026 03/19/15 0100 03/19/15 0252 03/19/15 0810  NA 140  < > 141 140 137 137 135  K 4.8  < > 3.6 3.0* 3.3* 3.0* 3.4*  CL 114*  < > 115* 115* 110 113* 110  CO2 6*  < > 10* 9* 11* 9* 9*  GLUCOSE 169*  < > 106* 100* 209* 196* 165*  BUN 11  < > <5* <5* <5* <5* <5*  CREATININE 0.98  < > 0.87 0.86 0.65 0.73 0.76  CALCIUM 8.6*  < > 8.2* 8.3* 8.3* 7.5* 8.1*  MG 1.9  --   --   --   --   --   --   PHOS 3.9  --   --   --   --   --   --   < > = values in this interval not displayed. Liver Function Tests:  Recent Labs Lab 03/17/15 1600 03/18/15 2300  AST 44* 55*  ALT 23 26  ALKPHOS 164* 133*  BILITOT 2.0* 0.9  PROT 8.8* 7.5  ALBUMIN 4.3 3.6    Recent Labs Lab 03/17/15 1600 03/18/15 0600  LIPASE 14* 12*  AMYLASE  --  30   No results for input(s): AMMONIA in the last 168 hours. CBC:  Recent Labs Lab 03/17/15 1600 03/17/15 2130 03/18/15 0355  WBC 10.3 17.6* 16.9*  NEUTROABS 8.8*  --  14.0*  HGB 12.5 12.3 13.0  HCT 36.6 35.8* 38.8  MCV 80.3 81.4 82.7  PLT 341 325 363   Cardiac Enzymes:  Recent Labs Lab 03/18/15 0355  TROPONINI <0.03   BNP (last 3 results) No results for input(s): BNP in the last 8760 hours.  ProBNP (last 3 results) No results for input(s): PROBNP in the last 8760 hours.  CBG:  Recent Labs Lab 03/19/15 0337 03/19/15 0445 03/19/15 0548 03/19/15 0648 03/19/15 0741  GLUCAP 151* 104* 148* 201* 194*    Recent Results (from the past 240 hour(s))  MRSA PCR Screening     Status: None   Collection Time: 03/17/15  6:51 PM  Result Value Ref Range Status   MRSA by PCR NEGATIVE NEGATIVE Final    Comment:        The GeneXpert MRSA Assay (FDA approved for NASAL specimens only), is one component of a comprehensive MRSA  colonization surveillance program. It is not intended to diagnose MRSA infection nor to guide or monitor treatment for MRSA infections.   Culture, blood (routine x 2)     Status: None (Preliminary result)   Collection Time: 03/17/15  9:55 PM  Result Value Ref Range Status   Specimen Description BLOOD LEFT HAND  Final   Special Requests PEDIATRICS 4ML  Final   Culture   Final    NO GROWTH < 24 HOURS Performed at [facility]    Report Status PENDING  Incomplete     Studies: Ct Abdomen Pelvis W Contrast  03/18/2015   CLINICAL DATA:  Upper abdominal pain for 2 days.  EXAM: CT ABDOMEN AND PELVIS WITH CONTRAST  TECHNIQUE: Multidetector CT imaging of the abdomen and pelvis was performed using the standard protocol following bolus administration of intravenous contrast.  CONTRAST:  160mL OMNIPAQUE IOHEXOL 300 MG/ML  SOLN  COMPARISON:  None.  FINDINGS: Sagittal images of the spine are unremarkable. No acute fractures are noted.  Enhanced liver, pancreas, spleen and adrenal glands are unremarkable. Tiny gallstones are noted within dependent gallbladder the largest measures 2.7 mm.  Kidneys are symmetrical in size and enhancement. No aortic aneurysm. No hydronephrosis or hydroureter.  There is no small bowel obstruction. No ascites or free air. No adenopathy.  There is mild thickening of the skin and mild subdermal subcutaneous stranding in left anterior abdominal wall supraumbilical region see axial image 46. No definite subcutaneous hematoma. Mild cellulitis or recent subdermal injection cannot be excluded. Clinical correlation is necessary.  There is no pericecal inflammation. The terminal ileum is unremarkable. The appendix is not identified. Moderate stool noted in distal sigmoid colon and rectum. There is a retroflexed uterus. No adnexal mass. There is a Foley catheter within decompressed urinary bladder. No inguinal adenopathy. No destructive bony lesions are noted within pelvis.  There is a  dominant follicle within right ovary measures 1.7 cm  Delayed renal images shows bilateral renal symmetrical excretion.  IMPRESSION: 1. Mild skin thickening and stranding of subdermal subcutaneous fat in left anterior abdominal wall see axial image 46. Mild dermatitis or recent subdermal injection cannot be excluded. Clinical correlation is necessary. 2. Tiny gallstones are noted within gallbladder the largest measures 2.7 mm. No pericholecystic fluid. 3. There is no pericecal inflammation. The appendix is not identified. 4. No hydronephrosis or hydroureter. 5. Retroflexed uterus. Dominant follicle within right ovary measures 1.7 cm. 6. No small bowel obstruction. 7. Moderate stool noted in distal sigmoid colon and rectum. 8. There is a Foley catheter within decompressed urinary bladder.   Electronically Signed   By: Orlean Bradford.D.  On: 03/18/2015 11:40   Dg Chest Port 1 View  03/18/2015   CLINICAL DATA:  Leukocytosis.  EXAM: PORTABLE CHEST - 1 VIEW  COMPARISON:  May 17, 2011.  FINDINGS: The heart size and mediastinal contours are within normal limits. Both lungs are clear. No pneumothorax or pleural effusion is noted. The visualized skeletal structures are unremarkable.  IMPRESSION: No acute cardiopulmonary abnormality seen.   Electronically Signed   By: Marijo Conception, M.D.   On: 03/18/2015 10:17    Scheduled Meds: . heparin  5,000 Units Subcutaneous 3 times per day  . lactated ringers  1,000 mL Intravenous Once  . piperacillin-tazobactam (ZOSYN)  IV  3.375 g Intravenous 3 times per day  . pneumococcal 23 valent vaccine  0.5 mL Intramuscular Tomorrow-1000  . potassium chloride  10 mEq Intravenous Q1 Hr x 4  . potassium chloride  40 mEq Oral Once  . vancomycin  1,000 mg Intravenous Q8H   Continuous Infusions: . dextrose 5 % and 0.45% NaCl 150 mL/hr at 03/18/15 1723  . insulin (NOVOLIN-R) infusion 1.9 Units/hr (03/19/15 0851)    Time spent: > 35 minutes  Velvet Bathe  [facility] Pager [phone] If 7PM-7AM, please contact night-coverage at [email or web address], password [facility] 03/19/2015, 9:27 AM  LOS: 2 days

## 2015-03-19 NOTE — Clinical Documentation Improvement (Signed)
    Abnormal Lab and/or Testing Results: Component     Latest Ref Rng 03/18/2015 03/18/2015 03/18/2015 03/19/2015 03/19/2015        11:30 AM  4:20 PM  8:26 PM  1:00 AM  2:52 AM  Potassium     3.5 - 5.1 mmol/L 4.0 3.6 3.0 (L) 3.3 (L) 3.0 (L)   Treatment provided: BMP q4h 7/20: IV potassium 51mEq q1h x 2 doses 7/21: IV potassium 75mEq q1h x 3 does   Possible Clinical Conditions:  Hypokalemia Other Unable to determine  Thank you, Saabir Blyth T. Pricilla Handler, MSN, MBA/MHA Clinical Documentation Specialist [email or web address] Office # [phone]

## 2015-03-19 NOTE — Progress Notes (Signed)
eLink Physician-Brief Progress Note Patient Name: Kimberly [name] DOB: 05/06/1991 MRN: [id number]   Date of Service  03/19/2015  HPI/Events of Note  Lactic acid falling. Pt improving. Doubt sepsis  eICU Interventions  Cont fluids IV and DKA protocol No ccm intervention required      Intervention Category Major Interventions: Acid-Base disturbance - evaluation and management  Asencion Noble 03/19/2015, 3:24 AM

## 2015-03-19 NOTE — Progress Notes (Signed)
[place] Progress Note Patient Name: Kimberly [name] DOB: [birth year] MRN: [id number]   Date of Service  03/19/2015  HPI/Events of Note  Lactate high, gap still high  eICU Interventions  Give NS bolus May need formal ccm consultation later, f/u lactate     Intervention Category Major Interventions: Acid-Base disturbance - evaluation and management  Asencion Noble 03/19/2015, 12:18 AM

## 2015-03-19 NOTE — Progress Notes (Signed)
Pt well-known to this NP for past several nights in hospital. Today, her bicarb has actually decreased again and gap is not closed. She had additional LR 1L bolus on days and IVF has been changed to D5LR due to her NA & CL trending upward.  Tonight, she had episode of chest pain described as "feeling like someone was sitting on her chest". This is accompanied by LUQ tonight. Pt placed on O2 2LNC and later remained 100% even after O2 removed. EKG at that time showed sinus tach, no acute changes except for prolonged Qtc.  NP to bedside.  S: pt states her pain is dull and located in the LUQ and underneath breast. Not really "chest pain" now. Says it comes and goes and is accompanied by nausea. The only thing that relieves either one is medicine. When the medicine "wears off" the pain and nausea return. No back pain. No pain with deep breath. Says she just feels bad, even worse than yesterday or one hour ago. Says she is "puffy".  O: Acutely ill, fatigued appearing young AAF in no acute distress. Calm. Alert and oriented. VS 160s/90s. HR 120s and ST, RR 18-20, O2 sat 100% on RA. ENT: Face is puffy including eyes. Card: S1S2 without MRG. Tachycardic. Resp: CTA, normal effort. Abd: No bowel sounds heard. Soft. Generalized tenderness, but worse in RU and LU quadrants. She grimaces but no guarding. No rebound. Do not see a fluid wave. Ext: 1+ pedal edema. MOE x 4. Neuro: no focal deficits. Skin: warm to hot. Dry.  A/P: 1. DKA with continued severe acidosis. ABG confirms metabolic acidosis but is slightly improved since ABG 2 days ago. Her Bicarb has actually decreased today (12 to 9). Continue insulin drip. DKA protocol. BMPs q4hrs-one is due now. Will follow labs. Curious case.  2. Chest pain-atypical-associated with abd pain and nausea. EKG OK. Don't think trops are necessary.  3. N/v-no bowel sounds but has had nothing to eat. Check KUB to r/o ileus. Discussed with pt that NGT may need to be placed. Stop Zofran  due to prolonged Qtc. Added rectal phenergan.  4. HTN-continues. Hydralazine prn. Creat is OK.  5. Tachycardic-likely compensation for acute illness.  6. Warm to hot skin-RN to check rectal temp.  7. Leukocytosis-? Infection. Started broad spectrum empiric abx yesterday. So far, no infectious process identified.  Will continue Vanc/Zosyn tonight, but may be able to d/c. Previous CT abd and pelvis showed tiny gallstones but no cholecystitis or obstruction of duct. Blood cxs and urine cx NGTD. Will follow.  This NP again discussed case with Dr. Ashok Cordia of PCCM. Labs placed to recheck amylase, lipase, and LFTs.   Clance Boll, NP [facility]

## 2015-03-19 NOTE — Progress Notes (Signed)
Entered pt's room to find pt sitting up in bed tachypneic w/ increased WOB and HR elevated to 130s (baseline this admit 110s-120s). Pt c/o of 10/10 chest pain making it difficult to breathe and LUQ pain. 1mg  Morphine given, stat EKG obtained, CBG checked, 2L O2 applied (sat 100% on RA) and [facility] consulted. New orders received. [facility] on call notified of change in condition, additional orders received. Shift report given to [place], Therapist, sports. Pt currently w/ decreased WOB and improvement in pain.   Dorrene German, RN

## 2015-03-19 NOTE — Progress Notes (Signed)
eLink Physician-Brief Progress Note Patient Name: NEVAE FRIGON DOB: November 12, 1990 MRN: [id number]   Date of Service  03/19/2015  HPI/Events of Note  Reviewed labs. Respiratory alkalosis compensating for metabolic acidosis. Hypokalemia being addressed with IV KCl. Normal LFTs, Amylase, & Lipase.  eICU Interventions  None.     Intervention Category Major Interventions: Acid-Base disturbance - evaluation and management  Tera Partridge 03/19/2015, 9:38 PM

## 2015-03-20 DIAGNOSIS — E876 Hypokalemia: Secondary | ICD-10-CM

## 2015-03-20 DIAGNOSIS — E1021 Type 1 diabetes mellitus with diabetic nephropathy: Secondary | ICD-10-CM

## 2015-03-20 LAB — GLUCOSE, CAPILLARY
GLUCOSE-CAPILLARY: 108 mg/dL — AB (ref 65–99)
GLUCOSE-CAPILLARY: 277 mg/dL — AB (ref 65–99)
GLUCOSE-CAPILLARY: 116 mg/dL — AB (ref 65–99)
GLUCOSE-CAPILLARY: 184 mg/dL — AB (ref 65–99)
GLUCOSE-CAPILLARY: 223 mg/dL — AB (ref 65–99)
GLUCOSE-CAPILLARY: 112 mg/dL — AB (ref 65–99)
GLUCOSE-CAPILLARY: 49 mg/dL — AB (ref 65–99)
Glucose-Capillary: 109 mg/dL — ABNORMAL HIGH (ref 65–99)
Glucose-Capillary: 129 mg/dL — ABNORMAL HIGH (ref 65–99)
Glucose-Capillary: 141 mg/dL — ABNORMAL HIGH (ref 65–99)
Glucose-Capillary: 145 mg/dL — ABNORMAL HIGH (ref 65–99)
Glucose-Capillary: 152 mg/dL — ABNORMAL HIGH (ref 65–99)
Glucose-Capillary: 161 mg/dL — ABNORMAL HIGH (ref 65–99)
Glucose-Capillary: 176 mg/dL — ABNORMAL HIGH (ref 65–99)
Glucose-Capillary: 198 mg/dL — ABNORMAL HIGH (ref 65–99)
Glucose-Capillary: 198 mg/dL — ABNORMAL HIGH (ref 65–99)
Glucose-Capillary: 203 mg/dL — ABNORMAL HIGH (ref 65–99)
Glucose-Capillary: 217 mg/dL — ABNORMAL HIGH (ref 65–99)
Glucose-Capillary: 219 mg/dL — ABNORMAL HIGH (ref 65–99)
Glucose-Capillary: 43 mg/dL — CL (ref 65–99)
Glucose-Capillary: 54 mg/dL — ABNORMAL LOW (ref 65–99)
Glucose-Capillary: 96 mg/dL (ref 65–99)
Glucose-Capillary: 96 mg/dL (ref 65–99)

## 2015-03-20 LAB — BASIC METABOLIC PANEL
ANION GAP: 15 (ref 5–15)
ANION GAP: 7 (ref 5–15)
Anion gap: 14 (ref 5–15)
Anion gap: 14 (ref 5–15)
Anion gap: 13 (ref 5–15)
Anion gap: 15 (ref 5–15)
Anion gap: 8 (ref 5–15)
BUN: <5 mg/dL — ABNORMAL LOW (ref 6–20)
BUN: <5 mg/dL — ABNORMAL LOW (ref 6–20)
BUN: <5 mg/dL — ABNORMAL LOW (ref 6–20)
BUN: <5 mg/dL — ABNORMAL LOW (ref 6–20)
BUN: <5 mg/dL — ABNORMAL LOW (ref 6–20)
CALCIUM: 7.9 mg/dL — AB (ref 8.9–10.3)
CALCIUM: 8.4 mg/dL — AB (ref 8.9–10.3)
CALCIUM: 7.9 mg/dL — AB (ref 8.9–10.3)
CALCIUM: 8.1 mg/dL — AB (ref 8.9–10.3)
CHLORIDE: 108 mmol/L (ref 101–111)
CHLORIDE: 108 mmol/L (ref 101–111)
CO2: 15 mmol/L — ABNORMAL LOW (ref 22–32)
CO2: 13 mmol/L — AB (ref 22–32)
CO2: 14 mmol/L — AB (ref 22–32)
CO2: 18 mmol/L — AB (ref 22–32)
CO2: 20 mmol/L — AB (ref 22–32)
CO2: 13 mmol/L — AB (ref 22–32)
CO2: 11 mmol/L — AB (ref 22–32)
CREATININE: 0.75 mg/dL (ref 0.44–1.00)
CREATININE: 0.76 mg/dL (ref 0.44–1.00)
CREATININE: 0.95 mg/dL (ref 0.44–1.00)
Calcium: 8 mg/dL — ABNORMAL LOW (ref 8.9–10.3)
Calcium: 7.8 mg/dL — ABNORMAL LOW (ref 8.9–10.3)
Calcium: 7.9 mg/dL — ABNORMAL LOW (ref 8.9–10.3)
Chloride: 107 mmol/L (ref 101–111)
Chloride: 108 mmol/L (ref 101–111)
Chloride: 108 mmol/L (ref 101–111)
Chloride: 110 mmol/L (ref 101–111)
Chloride: 111 mmol/L (ref 101–111)
Creatinine, Ser: 0.66 mg/dL (ref 0.44–1.00)
Creatinine, Ser: 0.77 mg/dL (ref 0.44–1.00)
Creatinine, Ser: 0.8 mg/dL (ref 0.44–1.00)
Creatinine, Ser: 0.8 mg/dL (ref 0.44–1.00)
GFR calc Af Amer: >60 mL/min (ref >60)
GFR calc Af Amer: >60 mL/min (ref >60)
GFR calc Af Amer: >60 mL/min (ref >60)
GFR calc Af Amer: >60 mL/min (ref >60)
GFR calc Af Amer: >60 mL/min (ref >60)
GFR calc non Af Amer: >60 mL/min (ref >60)
GFR calc non Af Amer: >60 mL/min (ref >60)
GFR calc non Af Amer: >60 mL/min (ref >60)
GFR calc non Af Amer: >60 mL/min (ref >60)
GFR calc non Af Amer: >60 mL/min (ref >60)
GFR calc non Af Amer: >60 mL/min (ref >60)
Glucose, Bld: 106 mg/dL — ABNORMAL HIGH (ref 65–99)
Glucose, Bld: 124 mg/dL — ABNORMAL HIGH (ref 65–99)
Glucose, Bld: 125 mg/dL — ABNORMAL HIGH (ref 65–99)
Glucose, Bld: 197 mg/dL — ABNORMAL HIGH (ref 65–99)
Glucose, Bld: 55 mg/dL — ABNORMAL LOW (ref 65–99)
Glucose, Bld: 80 mg/dL (ref 65–99)
Glucose, Bld: 98 mg/dL (ref 65–99)
POTASSIUM: 3.3 mmol/L — AB (ref 3.5–5.1)
POTASSIUM: 3.2 mmol/L — AB (ref 3.5–5.1)
Potassium: 3.1 mmol/L — ABNORMAL LOW (ref 3.5–5.1)
Potassium: 3.3 mmol/L — ABNORMAL LOW (ref 3.5–5.1)
Potassium: 2.9 mmol/L — ABNORMAL LOW (ref 3.5–5.1)
Potassium: 3.4 mmol/L — ABNORMAL LOW (ref 3.5–5.1)
Potassium: 3.6 mmol/L (ref 3.5–5.1)
SODIUM: 135 mmol/L (ref 135–145)
SODIUM: 134 mmol/L — AB (ref 135–145)
SODIUM: 135 mmol/L (ref 135–145)
SODIUM: 136 mmol/L (ref 135–145)
Sodium: 136 mmol/L (ref 135–145)
Sodium: 136 mmol/L (ref 135–145)
Sodium: 138 mmol/L (ref 135–145)

## 2015-03-20 LAB — CBC
HCT: 26.8 % — ABNORMAL LOW (ref 36.0–46.0)
HEMATOCRIT: 32.1 % — AB (ref 36.0–46.0)
Hemoglobin: 11.4 g/dL — ABNORMAL LOW (ref 12.0–15.0)
Hemoglobin: 9.4 g/dL — ABNORMAL LOW (ref 12.0–15.0)
MCH: 27.5 pg (ref 26.0–34.0)
MCH: 27.7 pg (ref 26.0–34.0)
MCHC: 35.5 g/dL (ref 30.0–36.0)
MCHC: 35.1 g/dL (ref 30.0–36.0)
MCV: 77.5 fL — ABNORMAL LOW (ref 78.0–100.0)
MCV: 79.1 fL (ref 78.0–100.0)
PLATELETS: 293 10*3/uL (ref 150–400)
Platelets: 235 10*3/uL (ref 150–400)
RBC: 4.14 MIL/uL (ref 3.87–5.11)
RBC: 3.39 MIL/uL — ABNORMAL LOW (ref 3.87–5.11)
RDW: 16.9 % — ABNORMAL HIGH (ref 11.5–15.5)
RDW: 17 % — ABNORMAL HIGH (ref 11.5–15.5)
WBC: 9.9 10*3/uL (ref 4.0–10.5)
WBC: 8.6 10*3/uL (ref 4.0–10.5)

## 2015-03-20 LAB — MAGNESIUM: Magnesium: 1.2 mg/dL — ABNORMAL LOW (ref 1.7–2.4)

## 2015-03-20 LAB — KETONES, URINE: KETONES UR: NEGATIVE mg/dL

## 2015-03-20 MED ORDER — POTASSIUM CHLORIDE 10 MEQ/100ML IV SOLN
10.0000 meq | INTRAVENOUS | Status: AC
  Administered 2015-03-20 (×2): 10 meq via INTRAVENOUS
  Filled 2015-03-20 (×2): qty 100

## 2015-03-20 MED ORDER — DEXTROSE 50 % IV SOLN
25.0000 mL | INTRAVENOUS | Status: DC | PRN
  Administered 2015-03-20 – 2015-03-21 (×3): 25 mL via INTRAVENOUS
  Filled 2015-03-20 (×3): qty 50

## 2015-03-20 MED ORDER — METOCLOPRAMIDE HCL 5 MG/ML IJ SOLN
10.0000 mg | Freq: Four times a day (QID) | INTRAMUSCULAR | Status: DC
  Administered 2015-03-20 – 2015-03-22 (×8): 10 mg via INTRAVENOUS
  Filled 2015-03-20 (×10): qty 2

## 2015-03-20 MED ORDER — INSULIN GLARGINE 100 UNIT/ML ~~LOC~~ SOLN: 15.0000 [IU] | Freq: Every day | SUBCUTANEOUS | Status: DC

## 2015-03-20 MED ORDER — SODIUM CHLORIDE 0.9 % IV SOLN: INTRAVENOUS | Status: DC

## 2015-03-20 MED ORDER — POTASSIUM CHLORIDE CRYS ER 20 MEQ PO TBCR: 40.0000 meq | EXTENDED_RELEASE_TABLET | Freq: Once | ORAL | Status: DC

## 2015-03-20 MED ORDER — INSULIN GLARGINE 100 UNIT/ML ~~LOC~~ SOLN
15.0000 [IU] | Freq: Every day | SUBCUTANEOUS | Status: DC
  Administered 2015-03-20: 15 [IU] via SUBCUTANEOUS
  Filled 2015-03-20 (×2): qty 0.15

## 2015-03-20 MED ORDER — INSULIN ASPART 100 UNIT/ML ~~LOC~~ SOLN: 0.0000 [IU] | Freq: Every day | SUBCUTANEOUS | Status: DC

## 2015-03-20 MED ORDER — INSULIN ASPART 100 UNIT/ML ~~LOC~~ SOLN
0.0000 [IU] | Freq: Three times a day (TID) | SUBCUTANEOUS | Status: DC
  Administered 2015-03-20 (×2): 5 [IU] via SUBCUTANEOUS
  Administered 2015-03-21 (×2): 3 [IU] via SUBCUTANEOUS
  Administered 2015-03-22: 5 [IU] via SUBCUTANEOUS
  Administered 2015-03-22: 3 [IU] via SUBCUTANEOUS

## 2015-03-20 MED ORDER — SENNA 8.6 MG PO TABS
1.0000 | ORAL_TABLET | Freq: Once | ORAL | Status: AC
  Administered 2015-03-20: 8.6 mg via ORAL
  Filled 2015-03-20: qty 1

## 2015-03-20 MED ORDER — PANTOPRAZOLE SODIUM 40 MG IV SOLR
40.0000 mg | INTRAVENOUS | Status: DC
  Administered 2015-03-20 – 2015-03-21 (×2): 40 mg via INTRAVENOUS
  Filled 2015-03-20 (×3): qty 40

## 2015-03-20 MED ORDER — INSULIN ASPART 100 UNIT/ML ~~LOC~~ SOLN: 0.0000 [IU] | Freq: Three times a day (TID) | SUBCUTANEOUS | Status: DC

## 2015-03-20 MED ORDER — POTASSIUM CHLORIDE 10 MEQ/100ML IV SOLN
10.0000 meq | INTRAVENOUS | Status: AC
  Administered 2015-03-20 (×4): 10 meq via INTRAVENOUS
  Filled 2015-03-20 (×4): qty 100

## 2015-03-20 MED ORDER — DEXTROSE-NACL 5-0.45 % IV SOLN: INTRAVENOUS | Status: DC

## 2015-03-20 MED ORDER — LACTATED RINGERS IV SOLN
INTRAVENOUS | Status: DC
  Administered 2015-03-20 – 2015-03-21 (×2): via INTRAVENOUS

## 2015-03-20 MED ORDER — SODIUM CHLORIDE 0.9 % IJ SOLN: 10.0000 mL | INTRAMUSCULAR | Status: DC | PRN

## 2015-03-20 MED ORDER — MAGNESIUM SULFATE IN D5W 10-5 MG/ML-% IV SOLN
1.0000 g | Freq: Once | INTRAVENOUS | Status: AC
  Administered 2015-03-20: 1 g via INTRAVENOUS
  Filled 2015-03-20: qty 100

## 2015-03-20 MED ORDER — METOPROLOL TARTRATE 1 MG/ML IV SOLN: 5.0000 mg | Freq: Four times a day (QID) | INTRAVENOUS | Status: DC | PRN

## 2015-03-20 MED ORDER — INSULIN REGULAR BOLUS VIA INFUSION
0.0000 [IU] | Freq: Three times a day (TID) | INTRAVENOUS | Status: DC
  Filled 2015-03-20: qty 10

## 2015-03-20 MED ORDER — POTASSIUM CHLORIDE 10 MEQ/100ML IV SOLN: 10.0000 meq | INTRAVENOUS | Status: DC

## 2015-03-20 MED ORDER — METOCLOPRAMIDE HCL 5 MG/ML IJ SOLN: 5.0000 mg | Freq: Four times a day (QID) | INTRAMUSCULAR | Status: DC

## 2015-03-20 NOTE — Progress Notes (Signed)
Pt tachycardic, BP WNL, 100% O2 SATs on R/A, stuporous but arouses and responds to voice then immediately falls back to sleep, CBG=43. See eMAR for intervention. Will recheck CBG in 58min.

## 2015-03-20 NOTE — Progress Notes (Signed)
Inpatient Diabetes Program Recommendations  AACE/ADA: New Consensus Statement on Inpatient Glycemic Control (2013)  Target Ranges:  Prepandial:   less than 140 mg/dL      Peak postprandial:   less than 180 mg/dL (1-2 hours)      Critically ill patients:  140 - 180 mg/dL   Reason for Visit: DKA  Diabetes history: DM1 Outpatient Diabetes medications: Lantus 33 units QHS, Novolog s/s + 1:10 CHO ratio Current orders for Inpatient glycemic control: D/C GlucoStabilizer, begin Lantus 15 units QHS, moderate tidwc and hs  24 year old female with hx of Type I DM on insulin pump and admitted with n/v and abdominal pain. She was found to be in DKA and was admitted to the SDU and started on aggressive IVF resuscitation and insulin drip with DKA protocol. Bicarb was 8 with Anion Gap of 20.  Today, bicarb is 11 and AG - 15.   Results for Kimberly [name], Kimberly [name] (MRN [id number]) as of 03/20/2015 11:00  Ref. Range 03/20/2015 00:46 03/20/2015 03:50 03/20/2015 07:58  Sodium Latest Ref Range: 135-145 mmol/L 136 135 134 (L)  Potassium Latest Ref Range: 3.5-5.1 mmol/L 3.2 (L) 3.1 (L) 3.3 (L)  Chloride Latest Ref Range: 101-111 mmol/L 107 108 108  CO2 Latest Ref Range: 22-32 mmol/L 15 (L) 13 (L) 11 (L)  BUN Latest Ref Range: 6-20 mg/dL <5 (L) <5 (L) <5 (L)  Creatinine Latest Ref Range: 0.44-1.00 mg/dL 0.66 0.75 0.76  Calcium Latest Ref Range: 8.9-10.3 mg/dL 8.4 (L) 7.9 (L) 8.1 (L)  EGFR (Non-African Amer.) Latest Ref Range: >60 mL/min >60 >60 >60  EGFR (African American) Latest Ref Range: >60 mL/min >60 >60 >60  Glucose Latest Ref Range: 65-99 mg/dL 197 (H) 98 106 (H)  Anion gap Latest Ref Range: 5-15  14 14 15    Results for Kimberly [name], Kimberly [name] (MRN [id number]) as of 03/20/2015 11:00  Ref. Range 03/20/2015 04:48 03/20/2015 05:41 03/20/2015 06:52 03/20/2015 07:57 03/20/2015 08:57 03/20/2015 10:00 03/20/2015 10:55  Glucose-Capillary Latest Ref Range: 65-99 mg/dL 141 (H) 198 (H) 145 (H) 96 116 (H) 217 (H) 184 (H)   Last 2 CBGs  > 180 mg/dL. Continues with abdominal pain, nausea and acidosis.  Recommendations: Continue with IV insulin/GlucoStabilizer until criteria met for transition to SQ insulin. At transition, would recommend Lantus 16 units Q12H, Novolog sensitive Q4H x 24H, then tidwc and hs. Will need meal coverage insulin when eating - Novolog 4 units tidwc if pt eats > 50% meal.  Discussed with MD and MD ordered to D/C insulin drip and begin basal/bolus insulin. Discussed with RN. Will continue to follow. Thank you. Lorenda Peck, RD, LDN, CDE Inpatient Diabetes Coordinator [phone]

## 2015-03-20 NOTE — Progress Notes (Addendum)
[facility] PROGRESS NOTE  Kimberly [name] [id number] DOB: 08-28-1991 DOA: 03/17/2015 PCP: Foye Spurling, MD  Assessment/Plan: Principal Problem:   DKA, type 1 -Patient's bicarbonate continues to be low. I suspect that she may have underlying kidney disease which may be affecting the amount of bicarbonate. As such given that anion gap is at 14 we'll go ahead and discontinue insulin drip. Patient clinically is better and denies any emesis. - Will transition to Lantus 15 units daily with sliding scale insulin and night coverage. Will obtain 1 BMP after transitioning to SQ insulin and then obtain a BMP next a.m.  Abdominal discomfort -Resolving we'll continue when necessary pain medications and antiemetics. -CT scan of abdomen reports no small bowel obstruction.  Essential hypertension -Could be secondary to discomfort from nausea. Will add when necessary labetalol -Continue hydralazine when necessary  Nausea and vomiting -Secondary to acidosis. - Improving will continue supportive therapy with antiemetics  Leukocytosis -Antibiotics added overnight. Patient afebrile and no clear source of infection. Low threshold to discontinue.  Active Problems:   Diabetic nephropathy associated with type 1 diabetes mellitus - Serum creatinine 0.84 on last check  Addendum: Hypokalemia - Patient is currently getting potassium replacement.  Code Status: Full Family Communication: No family at bedside Disposition Plan: Continue to monitor and step down unit   Consultants:  PCCM  Procedures:  None  Antibiotics: Vanc and Zosyn  HPI/Subjective: Patient states that her nausea is still present although she has not vomiting like she was initially.  Objective: Filed Vitals:   03/20/15 0800  BP: 191/122  Pulse: 117  Temp:   Resp: 15    Intake/Output Summary (Last 24 hours) at 03/20/15 0933 Last data filed at 03/20/15 0800  Gross per 24 hour  Intake 5164.88 ml  Output    2100 ml  Net 3064.88 ml   Filed Weights   03/17/15 1735  Weight: 81.194 kg (179 lb)    Exam:   General:  Patient in no acute distress, awake and alert  Cardiovascular: Regular rate and rhythm, no murmurs or rubs  Respiratory: Clear to auscultation bilaterally, equal chest rise, no wheezes  Abdomen: Soft, nondistended, generalized discomfort with deep palpation, no guarding, no rebound tenderness  Musculoskeletal: No cyanosis or clubbing   Data Reviewed: Basic Metabolic Panel:  Recent Labs Lab 03/17/15 2130  03/19/15 1155 03/19/15 1625 03/19/15 2019 03/20/15 0046 03/20/15 0350 03/20/15 0758  NA 140  < > 135 133* 134* 136 135 134*  K 4.8  < > 2.8* 3.9 3.0* 3.2* 3.1* 3.3*  CL 114*  < > 110 110 108 107 108 108  CO2 6*  < > 12* 9* 13* 15* 13* 11*  GLUCOSE 169*  < > 170* 210* 213* 197* 98 106*  BUN 11  < > <5* <5* <5* <5* <5* <5*  CREATININE 0.98  < > 0.72 0.86 0.71 0.66 0.75 0.76  CALCIUM 8.6*  < > 7.9* 7.6* 7.9* 8.4* 7.9* 8.1*  MG 1.9  --  1.3*  --   --   --   --   --   PHOS 3.9  --   --   --   --   --   --   --   < > = values in this interval not displayed. Liver Function Tests:  Recent Labs Lab 03/17/15 1600 03/18/15 2300 03/19/15 2019  AST 44* 55* 34  ALT 23 26 21   ALKPHOS 164* 133* 117  BILITOT 2.0* 0.9 0.8  PROT 8.8*  7.5 6.0*  ALBUMIN 4.3 3.6 2.8*    Recent Labs Lab 03/17/15 1600 03/18/15 0600 03/19/15 2019  LIPASE 14* 12* 25  AMYLASE  --  30 49   No results for input(s): AMMONIA in the last 168 hours. CBC:  Recent Labs Lab 03/17/15 1600 03/17/15 2130 03/18/15 0355 03/20/15 0046  WBC 10.3 17.6* 16.9* 9.9  NEUTROABS 8.8*  --  14.0*  --   HGB 12.5 12.3 13.0 11.4*  HCT 36.6 35.8* 38.8 32.1*  MCV 80.3 81.4 82.7 77.5*  PLT 341 325 363 293   Cardiac Enzymes:  Recent Labs Lab 03/18/15 0355  TROPONINI <0.03   BNP (last 3 results) No results for input(s): BNP in the last 8760 hours.  ProBNP (last 3 results) No results for input(s):  PROBNP in the last 8760 hours.  CBG:  Recent Labs Lab 03/20/15 0344 03/20/15 0448 03/20/15 0541 03/20/15 0652 03/20/15 0757  GLUCAP 96 141* 198* 145* 96    Recent Results (from the past 240 hour(s))  MRSA PCR Screening     Status: None   Collection Time: 03/17/15  6:51 PM  Result Value Ref Range Status   MRSA by PCR NEGATIVE NEGATIVE Final    Comment:        The GeneXpert MRSA Assay (FDA approved for NASAL specimens only), is one component of a comprehensive MRSA colonization surveillance program. It is not intended to diagnose MRSA infection nor to guide or monitor treatment for MRSA infections.   Culture, blood (routine x 2)     Status: None (Preliminary result)   Collection Time: 03/17/15  9:55 PM  Result Value Ref Range Status   Specimen Description BLOOD LEFT HAND  Final   Special Requests PEDIATRICS 4ML  Final   Culture   Final    NO GROWTH 2 DAYS Performed at [facility]    Report Status PENDING  Incomplete  Culture, blood (routine x 2)     Status: None (Preliminary result)   Collection Time: 03/17/15 11:52 PM  Result Value Ref Range Status   Specimen Description BLOOD LEFT ANTECUBITAL  Final   Special Requests BOTTLES DRAWN AEROBIC AND ANAEROBIC 7.5ML  Final   Culture   Final    NO GROWTH 1 DAY Performed at [facility]    Report Status PENDING  Incomplete  Urine culture     Status: None   Collection Time: 03/18/15  3:46 AM  Result Value Ref Range Status   Specimen Description URINE, CATHETERIZED  Final   Special Requests NONE  Final   Culture   Final    NO GROWTH 1 DAY Performed at [facility]    Report Status 03/19/2015 FINAL  Final     Studies: Ct Abdomen Pelvis W Contrast  03/18/2015   CLINICAL DATA:  Upper abdominal pain for 2 days.  EXAM: CT ABDOMEN AND PELVIS WITH CONTRAST  TECHNIQUE: Multidetector CT imaging of the abdomen and pelvis was performed using the standard protocol following bolus administration of  intravenous contrast.  CONTRAST:  165mL OMNIPAQUE IOHEXOL 300 MG/ML  SOLN  COMPARISON:  None.  FINDINGS: Sagittal images of the spine are unremarkable. No acute fractures are noted.  Enhanced liver, pancreas, spleen and adrenal glands are unremarkable. Tiny gallstones are noted within dependent gallbladder the largest measures 2.7 mm.  Kidneys are symmetrical in size and enhancement. No aortic aneurysm. No hydronephrosis or hydroureter.  There is no small bowel obstruction. No ascites or free air. No adenopathy.  There  is mild thickening of the skin and mild subdermal subcutaneous stranding in left anterior abdominal wall supraumbilical region see axial image 46. No definite subcutaneous hematoma. Mild cellulitis or recent subdermal injection cannot be excluded. Clinical correlation is necessary.  There is no pericecal inflammation. The terminal ileum is unremarkable. The appendix is not identified. Moderate stool noted in distal sigmoid colon and rectum. There is a retroflexed uterus. No adnexal mass. There is a Foley catheter within decompressed urinary bladder. No inguinal adenopathy. No destructive bony lesions are noted within pelvis.  There is a dominant follicle within right ovary measures 1.7 cm  Delayed renal images shows bilateral renal symmetrical excretion.  IMPRESSION: 1. Mild skin thickening and stranding of subdermal subcutaneous fat in left anterior abdominal wall see axial image 46. Mild dermatitis or recent subdermal injection cannot be excluded. Clinical correlation is necessary. 2. Tiny gallstones are noted within gallbladder the largest measures 2.7 mm. No pericholecystic fluid. 3. There is no pericecal inflammation. The appendix is not identified. 4. No hydronephrosis or hydroureter. 5. Retroflexed uterus. Dominant follicle within right ovary measures 1.7 cm. 6. No small bowel obstruction. 7. Moderate stool noted in distal sigmoid colon and rectum. 8. There is a Foley catheter within  decompressed urinary bladder.   Electronically Signed   By: Lahoma Crocker M.D.   On: 03/18/2015 11:40   Dg Chest Port 1 View  03/18/2015   CLINICAL DATA:  Leukocytosis.  EXAM: PORTABLE CHEST - 1 VIEW  COMPARISON:  May 17, 2011.  FINDINGS: The heart size and mediastinal contours are within normal limits. Both lungs are clear. No pneumothorax or pleural effusion is noted. The visualized skeletal structures are unremarkable.  IMPRESSION: No acute cardiopulmonary abnormality seen.   Electronically Signed   By: Marijo Conception, M.D.   On: 03/18/2015 10:17   Dg Abd Portable 1v  03/19/2015   CLINICAL DATA:  Increasing upper abdominal pain for 3 days. Nausea and vomiting. History of sickle cell disease and diabetes.  EXAM: PORTABLE ABDOMEN - 1 VIEW  COMPARISON:  CT abdomen pelvis - 03/18/2015  FINDINGS: Paucity of bowel gas without definite evidence of obstruction. Moderate colonic stool burden.  No supine evidence of pneumoperitoneum. No definite pneumatosis or portal venous gas.  No definite abnormal intra-abdominal calcifications.  No acute osseous abnormalities.  IMPRESSION: 1. Paucity of bowel gas without definite evidence of obstruction. 2. Moderate colonic stool burden.   Electronically Signed   By: Sandi Mariscal M.D.   On: 03/19/2015 21:07    Scheduled Meds: . heparin  5,000 Units Subcutaneous 3 times per day  . insulin aspart  0-15 Units Subcutaneous TID WC  . insulin aspart  0-5 Units Subcutaneous QHS  . insulin glargine  15 Units Subcutaneous QHS  . piperacillin-tazobactam (ZOSYN)  IV  3.375 g Intravenous 3 times per day  . pneumococcal 23 valent vaccine  0.5 mL Intramuscular Tomorrow-1000  . vancomycin  1,000 mg Intravenous Q8H   Continuous Infusions: . dextrose 5% lactated ringers 125 mL/hr at 03/19/15 1822    Time spent: > 35 minutes  Velvet Bathe  [facility] Pager [phone] If 7PM-7AM, please contact night-coverage at [email or web address], password [facility] 03/20/2015, 9:33 AM  LOS: 3  days      Addendum: Blood cultures and urine cultures remain negative no source of infection identified on chest x-ray or CT of abdomen will go ahead and discontinue IV antibiotics. Reassess CBC next a.m. Should patient develop any fevers we'll plan on continuing broad-spectrum antibiotics

## 2015-03-20 NOTE — Progress Notes (Signed)
Peripherally Inserted Central Catheter/Midline Placement  The IV Nurse has discussed with the patient and/or persons authorized to consent for the patient, the purpose of this procedure and the potential benefits and risks involved with this procedure.  The benefits include less needle sticks, lab draws from the catheter and patient may be discharged home with the catheter.  Risks include, but not limited to, infection, bleeding, blood clot (thrombus formation), and puncture of an artery; nerve damage and irregular heat beat.  Alternatives to this procedure were also discussed.  PICC/Midline Placement Documentation  PICC / Midline Double Lumen 03/20/15 PICC Right Brachial 41 cm 2 cm (Active)  Indication for Insertion or Continuance of Line Poor Vasculature-patient has had multiple peripheral attempts or PIVs lasting less than 24 hours 03/20/2015  2:00 PM  Exposed Catheter (cm) 2 cm 03/20/2015  2:00 PM  Dressing Change Due 03/27/15 03/20/2015  2:00 PM       Jule Economy Horton 03/20/2015, 2:25 PM

## 2015-03-21 DIAGNOSIS — E162 Hypoglycemia, unspecified: Secondary | ICD-10-CM

## 2015-03-21 DIAGNOSIS — R110 Nausea: Secondary | ICD-10-CM

## 2015-03-21 LAB — GLUCOSE, CAPILLARY
GLUCOSE-CAPILLARY: 37 mg/dL — AB (ref 65–99)
GLUCOSE-CAPILLARY: 58 mg/dL — AB (ref 65–99)
GLUCOSE-CAPILLARY: 101 mg/dL — AB (ref 65–99)
GLUCOSE-CAPILLARY: 190 mg/dL — AB (ref 65–99)
GLUCOSE-CAPILLARY: 87 mg/dL (ref 65–99)
Glucose-Capillary: 124 mg/dL — ABNORMAL HIGH (ref 65–99)
Glucose-Capillary: 178 mg/dL — ABNORMAL HIGH (ref 65–99)

## 2015-03-21 LAB — URINE MICROSCOPIC-ADD ON

## 2015-03-21 LAB — URINALYSIS, ROUTINE W REFLEX MICROSCOPIC
Bilirubin Urine: NEGATIVE
GLUCOSE, UA: NEGATIVE mg/dL
Ketones, ur: NEGATIVE mg/dL
Leukocytes, UA: NEGATIVE
NITRITE: NEGATIVE
Protein, ur: NEGATIVE mg/dL
Specific Gravity, Urine: 1.009 (ref 1.005–1.030)
UROBILINOGEN UA: 0.2 mg/dL (ref 0.0–1.0)
pH: 5 (ref 5.0–8.0)

## 2015-03-21 LAB — BASIC METABOLIC PANEL
ANION GAP: 7 (ref 5–15)
BUN: <5 mg/dL — ABNORMAL LOW (ref 6–20)
CHLORIDE: 110 mmol/L (ref 101–111)
CO2: 20 mmol/L — AB (ref 22–32)
Calcium: 7.7 mg/dL — ABNORMAL LOW (ref 8.9–10.3)
Creatinine, Ser: 0.79 mg/dL (ref 0.44–1.00)
GFR calc non Af Amer: >60 mL/min (ref >60)
Glucose, Bld: 46 mg/dL — ABNORMAL LOW (ref 65–99)
Potassium: 3.2 mmol/L — ABNORMAL LOW (ref 3.5–5.1)
Sodium: 137 mmol/L (ref 135–145)

## 2015-03-21 MED ORDER — DEXTROSE-NACL 5-0.9 % IV SOLN
INTRAVENOUS | Status: DC
Start: 2015-03-21 — End: 2015-03-21
  Administered 2015-03-21: 125 mL/h via INTRAVENOUS

## 2015-03-21 MED ORDER — PIPERACILLIN-TAZOBACTAM 3.375 G IVPB
3.3750 g | Freq: Three times a day (TID) | INTRAVENOUS | Status: DC
  Administered 2015-03-21 – 2015-03-22 (×4): 3.375 g via INTRAVENOUS
  Filled 2015-03-21 (×5): qty 50

## 2015-03-21 MED ORDER — POTASSIUM CHLORIDE CRYS ER 20 MEQ PO TBCR
40.0000 meq | EXTENDED_RELEASE_TABLET | Freq: Once | ORAL | Status: AC
  Administered 2015-03-21: 40 meq via ORAL
  Filled 2015-03-21: qty 2

## 2015-03-21 MED ORDER — INSULIN GLARGINE 100 UNIT/ML ~~LOC~~ SOLN
10.0000 [IU] | Freq: Every day | SUBCUTANEOUS | Status: DC
  Administered 2015-03-21 – 2015-03-22 (×2): 10 [IU] via SUBCUTANEOUS
  Filled 2015-03-21 (×2): qty 0.1

## 2015-03-21 NOTE — Progress Notes (Signed)
No response received from Primary. CBG at approx. 0400=37. Called eLink MD. Regarding serial low blood glucose. See new orders.

## 2015-03-21 NOTE — Progress Notes (Signed)
Patient passed a small amount of urine in a speciman cup.  Unfortunately the top as not on securely, so the urine spilled out.  The urine needs to be recollected when the patient can void.  Called the fourth floor to let them know that the urine needs to be recollected.  Kimberly [name] Roselie Awkward RN

## 2015-03-21 NOTE — Progress Notes (Signed)
eLink Physician-Brief Progress Note Patient Name: ABBAGALE [name] DOB: 1990/12/03 MRN: [id number]   Date of Service  03/21/2015  HPI/Events of Note  Hypoglycemia with blood sugars in the 59s.  Is on lactated ringers at 75 cc/hr.  Gap has not closed but on lantus/SSI.  eICU Interventions  Plan: Change IVFs to D5NS at 125 cc/hr Nurse to contact primary team to inform them of the above findings and changes to IVF order     Intervention Category Intermediate Interventions: Other:  DETERDING,ELIZABETH 03/21/2015, 4:22 AM

## 2015-03-21 NOTE — Progress Notes (Signed)
Pt A,A&O x4, Sinus tach, calm and conversant. CBG less than 70. See eMAR for intervention. Notified provider via Amion text page regarding pt status. Need recommendations regarding POC for pt. Awaiting phone call or Provider's orders.

## 2015-03-21 NOTE — Progress Notes (Signed)
Agree with previous RN's assessment. Kimberly [name], Kimberly [name]

## 2015-03-21 NOTE — Progress Notes (Signed)
[facility] PROGRESS NOTE  Kimberly [name] [id number] DOB: 08/30/1990 DOA: 03/17/2015 PCP: Foye Spurling, MD  Assessment/Plan: Principal Problem:   DKA, type 1 - Resolved  DM type I - Place on Lantus 15 units SQ but patient had hypoglycemia as such decreased to Lantus 10 units SQ daily. - Continue diabetic diet and encourage po intake.   Hypoglycemia - Please see above - D 50 if glucose < 70 - encourage po intake.  Fever of unknown origin - Will cover with Zosyn. Urine culture and blood culture negative so far - obtain urinalysis  Abdominal discomfort -Resolving we'll continue when necessary pain medications and antiemetics. -CT scan of abdomen reports no small bowel obstruction. - improved with Reglan administration.  Essential hypertension -Could be secondary to discomfort from nausea.  - when necessary labetalol and hydralazine   Nausea and vomiting -Secondary to acidosis. - Improving will continue supportive therapy with antiemetics  Leukocytosis -Antibiotics added overnight. Patient afebrile and no clear source of infection. Low threshold to discontinue.  Active Problems:   Diabetic nephropathy associated with type 1 diabetes mellitus - Serum creatinine 0.84 on last check   Hypokalemia - Replace orally and reassess next am.  Code Status: Full Family Communication: No family at bedside Disposition Plan: Continue to monitor and step down unit   Consultants:  PCCM  Procedures:  None  Antibiotics: Zosyn  HPI/Subjective: Patient states that she feels improved. Is wanting to eat  Objective: Filed Vitals:   03/21/15 0800  BP: 110/60  Pulse: 109  Temp: 98.4 F (36.9 C)  Resp: 13    Intake/Output Summary (Last 24 hours) at 03/21/15 0918 Last data filed at 03/21/15 0800  Gross per 24 hour  Intake 1335.25 ml  Output    600 ml  Net 735.25 ml   Filed Weights   03/17/15 1735 03/21/15 0400  Weight: 81.194 kg (179 lb) 76.2 kg (167  lb 15.9 oz)    Exam:   General:  Patient in no acute distress, awake and alert  Cardiovascular: Regular rate and rhythm, no murmurs or rubs  Respiratory: Clear to auscultation bilaterally, equal chest rise, no wheezes  Abdomen: Soft, nondistended, generalized discomfort with deep palpation, no guarding, no rebound tenderness  Musculoskeletal: No cyanosis or clubbing   Data Reviewed: Basic Metabolic Panel:  Recent Labs Lab 03/17/15 2130  03/19/15 1155  03/20/15 1215 03/20/15 1645 03/20/15 2112 03/20/15 2304 03/21/15 0415  NA 140  < > 135  < > 135 136 136 138 137  K 4.8  < > 2.8*  < > 2.9* 3.3* 3.4* 3.6 3.2*  CL 114*  < > 110  < > 108 108 110 111 110  CO2 6*  < > 12*  < > 14* 13* 18* 20* 20*  GLUCOSE 169*  < > 170*  < > 124* 125* 80 55* 46*  BUN 11  < > <5*  < > <5* <5* <5* <5* <5*  CREATININE 0.98  < > 0.72  < > 0.80 0.95 0.80 0.77 0.79  CALCIUM 8.6*  < > 7.9*  < > 7.9* 8.0* 7.8* 7.9* 7.7*  MG 1.9  --  1.3*  --   --  1.2*  --   --   --   PHOS 3.9  --   --   --   --   --   --   --   --   < > = values in this interval not displayed. Liver Function Tests:  Recent  Labs Lab 03/17/15 1600 03/18/15 2300 03/19/15 2019  AST 44* 55* 34  ALT 23 26 21   ALKPHOS 164* 133* 117  BILITOT 2.0* 0.9 0.8  PROT 8.8* 7.5 6.0*  ALBUMIN 4.3 3.6 2.8*    Recent Labs Lab 03/17/15 1600 03/18/15 0600 03/19/15 2019  LIPASE 14* 12* 25  AMYLASE  --  30 49   No results for input(s): AMMONIA in the last 168 hours. CBC:  Recent Labs Lab 03/17/15 1600 03/17/15 2130 03/18/15 0355 03/20/15 0046 03/20/15 2304  WBC 10.3 17.6* 16.9* 9.9 8.6  NEUTROABS 8.8*  --  14.0*  --   --   HGB 12.5 12.3 13.0 11.4* 9.4*  HCT 36.6 35.8* 38.8 32.1* 26.8*  MCV 80.3 81.4 82.7 77.5* 79.1  PLT 341 325 363 293 235   Cardiac Enzymes:  Recent Labs Lab 03/18/15 0355  TROPONINI <0.03   BNP (last 3 results) No results for input(s): BNP in the last 8760 hours.  ProBNP (last 3 results) No results  for input(s): PROBNP in the last 8760 hours.  CBG:  Recent Labs Lab 03/20/15 2335 03/21/15 0007 03/21/15 0412 03/21/15 0414 03/21/15 0757  GLUCAP 54* 124* 37* 37* 58*    Recent Results (from the past 240 hour(s))  MRSA PCR Screening     Status: None   Collection Time: 03/17/15  6:51 PM  Result Value Ref Range Status   MRSA by PCR NEGATIVE NEGATIVE Final    Comment:        The GeneXpert MRSA Assay (FDA approved for NASAL specimens only), is one component of a comprehensive MRSA colonization surveillance program. It is not intended to diagnose MRSA infection nor to guide or monitor treatment for MRSA infections.   Culture, blood (routine x 2)     Status: None (Preliminary result)   Collection Time: 03/17/15  9:55 PM  Result Value Ref Range Status   Specimen Description BLOOD LEFT HAND  Final   Special Requests PEDIATRICS 4ML  Final   Culture   Final    NO GROWTH 3 DAYS Performed at [facility]    Report Status PENDING  Incomplete  Culture, blood (routine x 2)     Status: None (Preliminary result)   Collection Time: 03/17/15 11:52 PM  Result Value Ref Range Status   Specimen Description BLOOD LEFT ANTECUBITAL  Final   Special Requests BOTTLES DRAWN AEROBIC AND ANAEROBIC 7.5ML  Final   Culture   Final    NO GROWTH 2 DAYS Performed at [facility]    Report Status PENDING  Incomplete  Urine culture     Status: None   Collection Time: 03/18/15  3:46 AM  Result Value Ref Range Status   Specimen Description URINE, CATHETERIZED  Final   Special Requests NONE  Final   Culture   Final    NO GROWTH 1 DAY Performed at [facility]    Report Status 03/19/2015 FINAL  Final     Studies: Dg Abd Portable 1v  03/19/2015   CLINICAL DATA:  Increasing upper abdominal pain for 3 days. Nausea and vomiting. History of sickle cell disease and diabetes.  EXAM: PORTABLE ABDOMEN - 1 VIEW  COMPARISON:  CT abdomen pelvis - 03/18/2015  FINDINGS: Paucity of bowel  gas without definite evidence of obstruction. Moderate colonic stool burden.  No supine evidence of pneumoperitoneum. No definite pneumatosis or portal venous gas.  No definite abnormal intra-abdominal calcifications.  No acute osseous abnormalities.  IMPRESSION: 1. Paucity of bowel  gas without definite evidence of obstruction. 2. Moderate colonic stool burden.   Electronically Signed   By: Sandi Mariscal M.D.   On: 03/19/2015 21:07    Scheduled Meds: . heparin  5,000 Units Subcutaneous 3 times per day  . insulin aspart  0-15 Units Subcutaneous TID WC  . insulin aspart  0-5 Units Subcutaneous QHS  . insulin glargine  10 Units Subcutaneous Daily  . metoCLOPramide (REGLAN) injection  10 mg Intravenous 4 times per day  . pantoprazole (PROTONIX) IV  40 mg Intravenous Q24H  . pneumococcal 23 valent vaccine  0.5 mL Intramuscular Tomorrow-1000  . potassium chloride  40 mEq Oral Once   Continuous Infusions: . dextrose 5 % and 0.9% NaCl 125 mL/hr (03/21/15 0435)    Time spent: > 35 minutes  Velvet Bathe  [facility] Pager [phone] If 7PM-7AM, please contact night-coverage at [email or web address], password [facility] 03/21/2015, 9:18 AM  LOS: 4 days

## 2015-03-22 LAB — BASIC METABOLIC PANEL
ANION GAP: 6 (ref 5–15)
BUN: 9 mg/dL (ref 6–20)
CALCIUM: 7.6 mg/dL — AB (ref 8.9–10.3)
CHLORIDE: 110 mmol/L (ref 101–111)
CO2: 21 mmol/L — ABNORMAL LOW (ref 22–32)
CREATININE: 1.12 mg/dL — AB (ref 0.44–1.00)
GFR calc Af Amer: >60 mL/min (ref >60)
GFR calc non Af Amer: >60 mL/min (ref >60)
Glucose, Bld: 160 mg/dL — ABNORMAL HIGH (ref 65–99)
Potassium: 3.5 mmol/L (ref 3.5–5.1)
SODIUM: 137 mmol/L (ref 135–145)

## 2015-03-22 LAB — CULTURE, BLOOD (ROUTINE X 2): Culture: NO GROWTH

## 2015-03-22 LAB — GLUCOSE, CAPILLARY
Glucose-Capillary: 191 mg/dL — ABNORMAL HIGH (ref 65–99)
Glucose-Capillary: 228 mg/dL — ABNORMAL HIGH (ref 65–99)

## 2015-03-22 MED ORDER — AMOXICILLIN-POT CLAVULANATE 875-125 MG PO TABS: 1.0000 | ORAL_TABLET | Freq: Two times a day (BID) | ORAL | Status: DC

## 2015-03-22 MED ORDER — INSULIN DETEMIR 100 UNIT/ML ~~LOC~~ SOLN: 10.0000 [IU] | Freq: Every day | SUBCUTANEOUS | Status: DC

## 2015-03-22 MED ORDER — INSULIN ASPART 100 UNIT/ML ~~LOC~~ SOLN
0.0000 [IU] | Freq: Three times a day (TID) | SUBCUTANEOUS | Status: DC
Start: 2015-03-22 — End: 2017-09-18

## 2015-03-22 NOTE — Discharge Summary (Addendum)
Physician Discharge Summary  Kimberly [name] DSW:[id number] DOB: 1991-04-03 DOA: 03/17/2015  PCP: Foye Spurling, MD  Admit date: 03/17/2015 Discharge date: 03/22/2015  Time spent: > 35 minutes  Recommendations for Outpatient Follow-up:  1. Monitor blood sugars and adjust hypoglycemic agents accordingly 2. Consider Gastric motility testing  Discharge Diagnoses:  Principal Problem:   DKA, type 1 Active Problems:   Diabetic nephropathy associated with type 1 diabetes mellitus   Discharge Condition: stable  Diet recommendation: Carb modified diet  Filed Weights   03/17/15 1735 03/21/15 0400  Weight: 81.194 kg (179 lb) 76.2 kg (167 lb 15.9 oz)    History of present illness:  24 y/o with h/o DM1 that presented with DKA  Hospital Course:  DKA - resolved Addendum: blood sugars controlled on Lantus 10 units SQ daily with Novolog slidding scale. Will provide script for Levemir (more affordable) and novolog with Slidding scale of which patient understands how to administer per our discussion.  She is to f/u with her endocrinologist at d/c.  Fever - resolved on antibiotics no clear source. Will d/c with script for augmentin for 6 more days - WBC normal on last check.  Abdominal pain/nausea - resolved, no pain or nausea reported on day of d/c - discussed that patient should consider discussing Reglan with her pcp should her abdominal discomfort or nausea recur.  Procedures:  None  Consultations:  None  Discharge Exam: Filed Vitals:   03/22/15 0551  BP: 138/83  Pulse: 93  Temp: 98.2 F (36.8 C)  Resp: 18    General: Pt in nad, alert and awake Cardiovascular: rrr, no mrg Respiratory: cta bl, no wheezes  Discharge Instructions   Discharge Instructions    Call MD for:  extreme fatigue    Complete by:  As directed      Call MD for:  persistant dizziness or light-headedness    Complete by:  As directed      Call MD for:  temperature >100.4    Complete by:  As  directed      Diet - low sodium heart healthy    Complete by:  As directed      Discharge instructions    Complete by:  As directed   Follow up with your primary care physician in 1-2 weeks or sooner should any new concners arise.     Increase activity slowly    Complete by:  As directed           Current Discharge Medication List    START taking these medications   Details  amoxicillin-clavulanate (AUGMENTIN) 875-125 MG per tablet Take 1 tablet by mouth 2 (two) times daily. Qty: 12 tablet, Refills: 0    insulin detemir (LEVEMIR) 100 UNIT/ML injection Inject 0.1 mLs (10 Units total) into the skin at bedtime. Qty: 10 mL, Refills: 0      CONTINUE these medications which have CHANGED   Details  insulin aspart (NOVOLOG) 100 UNIT/ML injection Inject 0-15 Units into the skin 3 (three) times daily with meals. Qty: 10 mL, Refills: 0      CONTINUE these medications which have NOT CHANGED   Details  BAYER CONTOUR NEXT TEST test strip USE AS DIRECTED TO TEST 6 TO 8 TIMES DAILY Qty: 1 each, Refills: 2   Associated Diagnoses: Pre-existing diabetes mellitus during pregnancy in first trimester    Blood Glucose Monitoring Suppl (CONTOUR NEXT EZ MONITOR) W/DEVICE KIT 1 each by Does not apply route 6 (six) times daily. Provide lancets for  testing six times daily. Type I DM with diabetes on Insulin Pump  DX 648.03 Qty: 1 kit, Refills: 6   Associated Diagnoses: Diabetes mellitus, antepartum, second trimester    etonogestrel (NEXPLANON) 68 MG IMPL implant Inject 1 each into the skin once.      STOP taking these medications     Insulin Human (INSULIN PUMP) SOLN      lisinopril (PRINIVIL,ZESTRIL) 5 MG tablet        No Known Allergies    The results of significant diagnostics from this hospitalization (including imaging, microbiology, ancillary and laboratory) are listed below for reference.    Significant Diagnostic Studies: Ct Abdomen Pelvis W Contrast  03/18/2015   CLINICAL DATA:   Upper abdominal pain for 2 days.  EXAM: CT ABDOMEN AND PELVIS WITH CONTRAST  TECHNIQUE: Multidetector CT imaging of the abdomen and pelvis was performed using the standard protocol following bolus administration of intravenous contrast.  CONTRAST:  170m OMNIPAQUE IOHEXOL 300 MG/ML  SOLN  COMPARISON:  None.  FINDINGS: Sagittal images of the spine are unremarkable. No acute fractures are noted.  Enhanced liver, pancreas, spleen and adrenal glands are unremarkable. Tiny gallstones are noted within dependent gallbladder the largest measures 2.7 mm.  Kidneys are symmetrical in size and enhancement. No aortic aneurysm. No hydronephrosis or hydroureter.  There is no small bowel obstruction. No ascites or free air. No adenopathy.  There is mild thickening of the skin and mild subdermal subcutaneous stranding in left anterior abdominal wall supraumbilical region see axial image 46. No definite subcutaneous hematoma. Mild cellulitis or recent subdermal injection cannot be excluded. Clinical correlation is necessary.  There is no pericecal inflammation. The terminal ileum is unremarkable. The appendix is not identified. Moderate stool noted in distal sigmoid colon and rectum. There is a retroflexed uterus. No adnexal mass. There is a Foley catheter within decompressed urinary bladder. No inguinal adenopathy. No destructive bony lesions are noted within pelvis.  There is a dominant follicle within right ovary measures 1.7 cm  Delayed renal images shows bilateral renal symmetrical excretion.  IMPRESSION: 1. Mild skin thickening and stranding of subdermal subcutaneous fat in left anterior abdominal wall see axial image 46. Mild dermatitis or recent subdermal injection cannot be excluded. Clinical correlation is necessary. 2. Tiny gallstones are noted within gallbladder the largest measures 2.7 mm. No pericholecystic fluid. 3. There is no pericecal inflammation. The appendix is not identified. 4. No hydronephrosis or  hydroureter. 5. Retroflexed uterus. Dominant follicle within right ovary measures 1.7 cm. 6. No small bowel obstruction. 7. Moderate stool noted in distal sigmoid colon and rectum. 8. There is a Foley catheter within decompressed urinary bladder.   Electronically Signed   By: LLahoma CrockerM.D.   On: 03/18/2015 11:40   Dg Chest Port 1 View  03/18/2015   CLINICAL DATA:  Leukocytosis.  EXAM: PORTABLE CHEST - 1 VIEW  COMPARISON:  May 17, 2011.  FINDINGS: The heart size and mediastinal contours are within normal limits. Both lungs are clear. No pneumothorax or pleural effusion is noted. The visualized skeletal structures are unremarkable.  IMPRESSION: No acute cardiopulmonary abnormality seen.   Electronically Signed   By: JMarijo Conception M.D.   On: 03/18/2015 10:17   Dg Abd Portable 1v  03/19/2015   CLINICAL DATA:  Increasing upper abdominal pain for 3 days. Nausea and vomiting. History of sickle cell disease and diabetes.  EXAM: PORTABLE ABDOMEN - 1 VIEW  COMPARISON:  CT abdomen pelvis - 03/18/2015  FINDINGS:  Paucity of bowel gas without definite evidence of obstruction. Moderate colonic stool burden.  No supine evidence of pneumoperitoneum. No definite pneumatosis or portal venous gas.  No definite abnormal intra-abdominal calcifications.  No acute osseous abnormalities.  IMPRESSION: 1. Paucity of bowel gas without definite evidence of obstruction. 2. Moderate colonic stool burden.   Electronically Signed   By: Sandi Mariscal M.D.   On: 03/19/2015 21:07    Microbiology: Recent Results (from the past 240 hour(s))  MRSA PCR Screening     Status: None   Collection Time: 03/17/15  6:51 PM  Result Value Ref Range Status   MRSA by PCR NEGATIVE NEGATIVE Final    Comment:        The GeneXpert MRSA Assay (FDA approved for NASAL specimens only), is one component of a comprehensive MRSA colonization surveillance program. It is not intended to diagnose MRSA infection nor to guide or monitor treatment  for MRSA infections.   Culture, blood (routine x 2)     Status: None (Preliminary result)   Collection Time: 03/17/15  9:55 PM  Result Value Ref Range Status   Specimen Description BLOOD LEFT HAND  Final   Special Requests PEDIATRICS 4ML  Final   Culture   Final    NO GROWTH 4 DAYS Performed at [facility]    Report Status PENDING  Incomplete  Culture, blood (routine x 2)     Status: None (Preliminary result)   Collection Time: 03/17/15 11:52 PM  Result Value Ref Range Status   Specimen Description BLOOD LEFT ANTECUBITAL  Final   Special Requests BOTTLES DRAWN AEROBIC AND ANAEROBIC 7.5ML  Final   Culture   Final    NO GROWTH 3 DAYS Performed at [facility]    Report Status PENDING  Incomplete  Urine culture     Status: None   Collection Time: 03/18/15  3:46 AM  Result Value Ref Range Status   Specimen Description URINE, CATHETERIZED  Final   Special Requests NONE  Final   Culture   Final    NO GROWTH 1 DAY Performed at [facility]    Report Status 03/19/2015 FINAL  Final     Labs: Basic Metabolic Panel:  Recent Labs Lab 03/17/15 2130  03/19/15 1155  03/20/15 1645 03/20/15 2112 03/20/15 2304 03/21/15 0415 03/22/15 0418  NA 140  < > 135  < > 136 136 138 137 137  K 4.8  < > 2.8*  < > 3.3* 3.4* 3.6 3.2* 3.5  CL 114*  < > 110  < > 108 110 111 110 110  CO2 6*  < > 12*  < > 13* 18* 20* 20* 21*  GLUCOSE 169*  < > 170*  < > 125* 80 55* 46* 160*  BUN 11  < > <5*  < > <5* <5* <5* <5* 9  CREATININE 0.98  < > 0.72  < > 0.95 0.80 0.77 0.79 1.12*  CALCIUM 8.6*  < > 7.9*  < > 8.0* 7.8* 7.9* 7.7* 7.6*  MG 1.9  --  1.3*  --  1.2*  --   --   --   --   PHOS 3.9  --   --   --   --   --   --   --   --   < > = values in this interval not displayed. Liver Function Tests:  Recent Labs Lab 03/17/15 1600 03/18/15 2300 03/19/15 2019  AST 44* 55* 34  ALT 23 26 21   ALKPHOS 164* 133* 117  BILITOT 2.0* 0.9 0.8  PROT 8.8* 7.5 6.0*  ALBUMIN 4.3 3.6 2.8*     Recent Labs Lab 03/17/15 1600 03/18/15 0600 03/19/15 2019  LIPASE 14* 12* 25  AMYLASE  --  30 49   No results for input(s): AMMONIA in the last 168 hours. CBC:  Recent Labs Lab 03/17/15 1600 03/17/15 2130 03/18/15 0355 03/20/15 0046 03/20/15 2304  WBC 10.3 17.6* 16.9* 9.9 8.6  NEUTROABS 8.8*  --  14.0*  --   --   HGB 12.5 12.3 13.0 11.4* 9.4*  HCT 36.6 35.8* 38.8 32.1* 26.8*  MCV 80.3 81.4 82.7 77.5* 79.1  PLT 341 325 363 293 235   Cardiac Enzymes:  Recent Labs Lab 03/18/15 0355  TROPONINI <0.03   BNP: BNP (last 3 results) No results for input(s): BNP in the last 8760 hours.  ProBNP (last 3 results) No results for input(s): PROBNP in the last 8760 hours.  CBG:  Recent Labs Lab 03/21/15 0833 03/21/15 1210 03/21/15 1639 03/21/15 2205 03/22/15 0717  GLUCAP 101* 190* 178* 87 191*     Signed:  Velvet Bathe  [facility] 03/22/2015, 11:20 AM

## 2015-03-23 LAB — CULTURE, BLOOD (ROUTINE X 2): Culture: NO GROWTH

## 2015-03-24 LAB — GLUCOSE, CAPILLARY: Glucose-Capillary: 37 mg/dL — CL (ref 65–99)

## 2016-04-02 ENCOUNTER — Encounter (HOSPITAL_COMMUNITY): Payer: Self-pay | Admitting: Emergency Medicine

## 2016-04-02 ENCOUNTER — Emergency Department (HOSPITAL_COMMUNITY)
Admission: EM | Admit: 2016-04-02 | Discharge: 2016-04-02 | Disposition: A | Discharge status: Home / Self Care | Payer: BLUE CROSS/BLUE SHIELD | Attending: Emergency Medicine | Admitting: Emergency Medicine

## 2016-04-02 DIAGNOSIS — L03811 Cellulitis of head [any part, except face]: Secondary | ICD-10-CM | POA: Insufficient documentation

## 2016-04-02 DIAGNOSIS — E119 Type 2 diabetes mellitus without complications: Secondary | ICD-10-CM | POA: Diagnosis not present

## 2016-04-02 DIAGNOSIS — Z794 Long term (current) use of insulin: Secondary | ICD-10-CM | POA: Insufficient documentation

## 2016-04-02 DIAGNOSIS — L02811 Cutaneous abscess of head [any part, except face]: Secondary | ICD-10-CM | POA: Diagnosis not present

## 2016-04-02 LAB — CBG MONITORING, ED: GLUCOSE-CAPILLARY: 237 mg/dL — AB (ref 65–99)

## 2016-04-02 MED ORDER — HYDROCODONE-ACETAMINOPHEN 5-325 MG PO TABS: 1.0000 | ORAL_TABLET | ORAL | 0 refills | Status: DC | PRN

## 2016-04-02 MED ORDER — LIDOCAINE-EPINEPHRINE 1 %-1:100000 IJ SOLN
20.0000 mL | Freq: Once | INTRAMUSCULAR | Status: AC
  Administered 2016-04-02: 20 mL
  Filled 2016-04-02: qty 1

## 2016-04-02 MED ORDER — DOXYCYCLINE HYCLATE 100 MG PO CAPS: 100.0000 mg | ORAL_CAPSULE | Freq: Two times a day (BID) | ORAL | 0 refills | Status: DC

## 2016-04-02 NOTE — Discharge Instructions (Signed)
Read the information below.  Use the prescribed medication as directed.  Please discuss all new medications with your pharmacist.  Do not take additional tylenol while taking the prescribed pain medication to avoid overdose.  You may return to the Emergency Department at any time for worsening condition or any new symptoms that concern you.    If you develop increased redness, swelling, increased pain, or fevers greater than 100.4, return to the ER immediately for a recheck.

## 2016-04-02 NOTE — ED Triage Notes (Signed)
Patient presents for abscess to scalp x6 days. Reports area was draining earlier this week but has now grown in size.

## 2016-04-02 NOTE — ED Notes (Signed)
Pt has a bump on the top of her head which she stated has been there since Sunday. She stated it did pop and now it is very  Hard.

## 2016-04-02 NOTE — ED Provider Notes (Signed)
[facility] DEPT Provider Note   CSN: [id number] Arrival date & time: 04/02/16  1608  First Provider Contact:   First MD Initiated Contact with Patient 04/02/16 1740      By signing my name below, I, Julien Nordmann, attest that this documentation has been prepared under the direction and in the presence of [place], PA-C.  Electronically Signed: Julien Nordmann, ED Scribe. 04/02/16. 5:34 PM.    History   Chief Complaint Chief Complaint  Patient presents with  . Abscess   The history is provided by the patient. No language interpreter was used.   HPI Comments: Kimberly [name] is a 25 y.o. female who has a PMhx of DMI, and sickle cell trait presents to the Emergency Department complaining of sudden onset, gradual worsening, "bump" on her scalp onset 6 days ago. She reports associated throbbing headache and tenderness to palpation surrounding the bump. She says the bump started off very small and progressively got bigger. She notes it popped and was draining purulent drainage a few days ago. Pt notes she has been putting hydrogen peroxide on the area since it popped to help with the size progression with no relief. She denies fever, chills, generalized body aches, numbness, weakness, nausea or vomiting.  Past Medical History:  Diagnosis Date  . Diabetes mellitus without complication ([place])   . Sickle cell trait [facility])     Patient Active Problem List   Diagnosis Date Noted  . DKA, type 1 ([place]) 03/17/2015  . Diabetic nephropathy associated with type 1 diabetes mellitus ([place]) 03/17/2015  . Pregnancy 10/23/2013  . Preeclampsia 10/21/2013  . Preeclampsia without severe features 10/07/2013  . Type 1 diabetes mellitus with diabetic nephropathy ([place]) 05/25/2013  . Preexisting diabetes complicating pregnancy, Class F, antepartum 05/20/2013    Past Surgical History:  Procedure Laterality Date  . WISDOM TOOTH EXTRACTION      OB History    Gravida Para Term Preterm AB Living   1 1    1   1    SAB TAB Ectopic Multiple Live Births           1       Home Medications    Prior to Admission medications   Medication Sig Start Date End Date Taking? Authorizing Provider  amoxicillin-clavulanate (AUGMENTIN) 875-125 MG per tablet Take 1 tablet by mouth 2 (two) times daily. 03/22/15   Velvet Bathe, MD  BAYER CONTOUR NEXT TEST test strip USE AS DIRECTED TO TEST 6 TO 8 TIMES DAILY 10/01/14   Woodroe Mode, MD  Blood Glucose Monitoring Suppl (CONTOUR NEXT EZ MONITOR) W/DEVICE KIT 1 each by Does not apply route 6 (six) times daily. Provide lancets for testing six times daily. Type I DM with diabetes on Insulin Pump  DX 648.03 06/17/13   Woodroe Mode, MD  etonogestrel (NEXPLANON) 68 MG IMPL implant Inject 1 each into the skin once.    Historical Provider, MD  insulin aspart (NOVOLOG) 100 UNIT/ML injection Inject 0-15 Units into the skin 3 (three) times daily with meals. 03/22/15   Velvet Bathe, MD  insulin detemir (LEVEMIR) 100 UNIT/ML injection Inject 0.1 mLs (10 Units total) into the skin at bedtime. 03/22/15   Velvet Bathe, MD    Family History Family History  Problem Relation Age of Onset  . Lupus Mother   . Aneurysm Mother     Social History Social History  Substance Use Topics  . Smoking status: Never Smoker  . Smokeless tobacco: Never Used  .  Alcohol use No     Allergies   Review of patient's allergies indicates no known allergies.   Review of Systems Review of Systems  Constitutional: Negative for chills and fever.  Gastrointestinal: Negative for nausea and vomiting.  Musculoskeletal: Negative for myalgias and neck stiffness.  Skin: Positive for color change.       Abscess to the top of the scalp  Neurological: Positive for headaches. Negative for weakness and numbness.  Psychiatric/Behavioral: Negative for self-injury.     Physical Exam Updated Vital Signs BP (!) 165/105 (BP Location: Left Arm) Comment: checkd twice  Pulse 106   Temp 98.1 F (36.7 C)  (Oral)   Resp 18   Ht 5' 5"  (1.651 m)   Wt 156 lb (70.8 kg)   SpO2 100%   BMI 25.96 kg/m   Physical Exam  Constitutional: She appears well-developed and well-nourished. No distress.  HENT:  Head: Normocephalic.  Right parietal scalp with small area of Edema, fluctuance, and dried purulent discharge with TTP  Neck: Neck supple.  Pulmonary/Chest: Effort normal.  Neurological: She is alert.  Skin: She is not diaphoretic.  Nursing note and vitals reviewed.    ED Treatments / Results  DIAGNOSTIC STUDIES: Oxygen Saturation is 100% on RA, normal by my interpretation.  COORDINATION OF CARE:  5:46 PM Discussed treatment plan which includes I&D with pt at bedside and pt agreed to plan.  Labs (all labs ordered are listed, but only abnormal results are displayed) Labs Reviewed  CBG MONITORING, ED - Abnormal; Notable for the following:       Result Value   Glucose-Capillary 237 (*)    All other components within normal limits    EKG  EKG Interpretation None       Radiology No results found.  Procedures .Marland KitchenIncision and Drainage Date/Time: 04/02/2016 5:49 PM Performed by: Clayton Bibles Authorized by: Clayton Bibles   Consent:    Consent obtained:  Verbal   Consent given by:  Patient   Alternatives discussed:  No treatment Location:    Type:  Abscess   Location:  Head   Head location:  Scalp Pre-procedure details:    Skin preparation:  Betadine Anesthesia (see MAR for exact dosages):    Anesthesia method:  Local infiltration   Local anesthetic:  Lidocaine 1% WITH epi Procedure details:    Incision types:  Single straight   Wound management:  Probed and deloculated   Drainage:  Purulent     (including critical care time)  Medications Ordered in ED Medications - No data to display   Initial Impression / Assessment and Plan / ED Course  I have reviewed the triage vital signs and the nursing notes.  Pertinent labs & imaging results that were available during my  care of the patient were reviewed by me and considered in my medical decision making (see chart for details).  Clinical Course    Afebrile, nontoxic patient with small abscess with overlying cellulitis of right parietal scalp.  I&D in ED with large amount of purulent d/c.   D/C home with doxycycline, pain medication, PCP follow up.   Discussed result, findings, treatment, and follow up  with patient.  Pt given return precautions.  Pt verbalizes understanding and agrees with plan.       Final Clinical Impressions(s) / ED Diagnoses   Final diagnoses:  Abscess or cellulitis of scalp   I personally performed the services described in this documentation, which was scribed in my presence. The recorded information  has been reviewed and is accurate.  New Prescriptions New Prescriptions   DOXYCYCLINE (VIBRAMYCIN) 100 MG CAPSULE    Take 1 capsule (100 mg total) by mouth 2 (two) times daily. One po bid x 7 days   HYDROCODONE-ACETAMINOPHEN (NORCO/VICODIN) 5-325 MG TABLET    Take 1-2 tablets by mouth every 4 (four) hours as needed for moderate pain or severe pain.     [place], PA-C 04/02/16 1847    Gareth Morgan, MD 04/05/16 0000

## 2017-09-08 ENCOUNTER — Encounter: Payer: Self-pay | Admitting: *Deleted

## 2017-09-09 ENCOUNTER — Encounter: Payer: Self-pay | Admitting: Obstetrics and Gynecology

## 2017-09-09 DIAGNOSIS — D069 Carcinoma in situ of cervix, unspecified: Secondary | ICD-10-CM | POA: Insufficient documentation

## 2017-09-18 ENCOUNTER — Other Ambulatory Visit (HOSPITAL_COMMUNITY)
Admission: RE | Admit: 2017-09-18 | Discharge: 2017-09-18 | Disposition: A | Discharge status: Home / Self Care | Payer: BLUE CROSS/BLUE SHIELD | Source: Ambulatory Visit | Attending: Obstetrics & Gynecology | Admitting: Obstetrics & Gynecology

## 2017-09-18 ENCOUNTER — Encounter: Payer: Self-pay | Admitting: Obstetrics & Gynecology

## 2017-09-18 ENCOUNTER — Ambulatory Visit: Payer: BLUE CROSS/BLUE SHIELD | Admitting: Obstetrics & Gynecology

## 2017-09-18 VITALS — BP 155/109 | HR 105 | Wt 156.0 lb

## 2017-09-18 DIAGNOSIS — D069 Carcinoma in situ of cervix, unspecified: Secondary | ICD-10-CM | POA: Insufficient documentation

## 2017-09-18 LAB — POCT PREGNANCY, URINE: Preg Test, Ur: NEGATIVE

## 2017-09-18 NOTE — Progress Notes (Signed)
   GYNECOLOGY OFFICE PROCEDURE NOTE  CHAN ROSASCO is a 27 y.o. G1P0101 here for LEEP. No GYN concerns. Pap smear and colposcopy history reviewed.    Pap 111/04/2017  LGSIL Colpo Biopsy  08/01/2017  CIN III; ECC  CIN II  Risks, benefits, alternatives, and limitations of procedure explained to patient, including pain, bleeding, infection, failure to remove abnormal tissue and failure to cure dysplasia, need for repeat procedures, damage to pelvic organs, cervical incompetence.  Role of HPV,cervical dysplasia and need for close followup was empasized. Informed written consent was obtained. All questions were answered. Time out performed. Urine pregnancy test was negative.  Procedure: The patient was placed in lithotomy position and the bivalved coated speculum was placed in the patient's vagina. A grounding pad placed on the patient. Lugol's solution was applied to the cervix and areas of decreased uptake were noted around the transformation zone.   Local anesthesia was administered via an intracervical block using 10 ml of 2% Lidocaine with epinephrine. The suction was turned on and the Large 1X Fisher Cone Biopsy Excisor on 41 Watts of blended current was used to excise the area of decreased uptake and excise the entire transformation zone. Excellent hemostasis was achieved using roller ball coagulation set at 60 Watts coagulation current. Monsel's solution was then applied and the speculum was removed from the vagina. Specimens were sent to pathology.  The patient tolerated the procedure well. Post-operative instructions given to patient, including instruction to seek medical attention for persistent bright red bleeding, fever, abdominal/pelvic pain, dysuria, nausea or vomiting. She was also told about the possibility of having copious yellow to black tinged discharge for weeks. She was counseled to avoid anything in the vagina (sex/douching/tampons) for 3 weeks. She has a 4 week post-operative  check to assess wound healing, review results and discuss further management.    Verita Schneiders, MD, [place] for [employer], [place]

## 2017-09-18 NOTE — Patient Instructions (Signed)
LEEP POST-PROCEDURE INSTRUCTIONS  1. You may take Ibuprofen, Aleve or Tylenol for pain if needed.  Cramping is normal.  2. You will have black and/or bloody discharge at first.  This will lighten and then turn clear before completely resolving.  This will take 2 to 3 weeks.  3. Put nothing in your vagina until the bleeding or discharge stops (usually 2 or 3 weeks).  4. You need to call if you have redness around the biopsy site, if there is any unusual draining, if the bleeding is heavy, or if you are concerned.  5. Shower or bathe as normal  6. We will call you within one week with results or we will discuss the results at your follow-up appointment if needed.  7. You will need to return for a follow-up Pap smear as directed by your physician.

## 2017-09-19 ENCOUNTER — Encounter: Payer: Self-pay | Admitting: *Deleted

## 2017-10-25 ENCOUNTER — Ambulatory Visit: Payer: BLUE CROSS/BLUE SHIELD | Admitting: Obstetrics & Gynecology

## 2018-03-19 ENCOUNTER — Encounter (HOSPITAL_COMMUNITY): Payer: Self-pay | Admitting: Emergency Medicine

## 2018-03-19 ENCOUNTER — Emergency Department (HOSPITAL_COMMUNITY)
Admission: EM | Admit: 2018-03-19 | Discharge: 2018-03-19 | Disposition: A | Discharge status: Home / Self Care | Payer: BLUE CROSS/BLUE SHIELD | Source: Home / Self Care | Attending: Emergency Medicine | Admitting: Emergency Medicine

## 2018-03-19 ENCOUNTER — Emergency Department (HOSPITAL_COMMUNITY): Payer: BLUE CROSS/BLUE SHIELD

## 2018-03-19 ENCOUNTER — Inpatient Hospital Stay (HOSPITAL_COMMUNITY)
Admission: EM | Admit: 2018-03-19 | Discharge: 2018-03-23 | DRG: 639 | Disposition: A | Discharge status: Home / Self Care | Payer: BLUE CROSS/BLUE SHIELD | Attending: Family Medicine | Admitting: Family Medicine

## 2018-03-19 ENCOUNTER — Encounter (HOSPITAL_COMMUNITY): Payer: Self-pay

## 2018-03-19 ENCOUNTER — Other Ambulatory Visit: Payer: Self-pay

## 2018-03-19 DIAGNOSIS — K3184 Gastroparesis: Secondary | ICD-10-CM | POA: Diagnosis present

## 2018-03-19 DIAGNOSIS — R1032 Left lower quadrant pain: Secondary | ICD-10-CM

## 2018-03-19 DIAGNOSIS — E1040 Type 1 diabetes mellitus with diabetic neuropathy, unspecified: Secondary | ICD-10-CM | POA: Insufficient documentation

## 2018-03-19 DIAGNOSIS — E1043 Type 1 diabetes mellitus with diabetic autonomic (poly)neuropathy: Secondary | ICD-10-CM | POA: Diagnosis present

## 2018-03-19 DIAGNOSIS — Z794 Long term (current) use of insulin: Secondary | ICD-10-CM

## 2018-03-19 DIAGNOSIS — D573 Sickle-cell trait: Secondary | ICD-10-CM | POA: Diagnosis present

## 2018-03-19 DIAGNOSIS — E1010 Type 1 diabetes mellitus with ketoacidosis without coma: Secondary | ICD-10-CM | POA: Diagnosis present

## 2018-03-19 DIAGNOSIS — I160 Hypertensive urgency: Secondary | ICD-10-CM | POA: Diagnosis present

## 2018-03-19 DIAGNOSIS — E876 Hypokalemia: Secondary | ICD-10-CM | POA: Diagnosis not present

## 2018-03-19 DIAGNOSIS — E10649 Type 1 diabetes mellitus with hypoglycemia without coma: Secondary | ICD-10-CM | POA: Diagnosis not present

## 2018-03-19 DIAGNOSIS — Z975 Presence of (intrauterine) contraceptive device: Secondary | ICD-10-CM

## 2018-03-19 DIAGNOSIS — E0810 Diabetes mellitus due to underlying condition with ketoacidosis without coma: Secondary | ICD-10-CM

## 2018-03-19 DIAGNOSIS — E1143 Type 2 diabetes mellitus with diabetic autonomic (poly)neuropathy: Secondary | ICD-10-CM | POA: Diagnosis not present

## 2018-03-19 DIAGNOSIS — E1021 Type 1 diabetes mellitus with diabetic nephropathy: Secondary | ICD-10-CM

## 2018-03-19 DIAGNOSIS — R1084 Generalized abdominal pain: Secondary | ICD-10-CM | POA: Diagnosis not present

## 2018-03-19 LAB — URINALYSIS, ROUTINE W REFLEX MICROSCOPIC
BACTERIA UA: NONE SEEN
BILIRUBIN URINE: NEGATIVE
Bilirubin Urine: NEGATIVE
Glucose, UA: NEGATIVE mg/dL
Glucose, UA: >=500 mg/dL — AB
Hgb urine dipstick: NEGATIVE
KETONES UR: NEGATIVE mg/dL
KETONES UR: 20 mg/dL — AB
LEUKOCYTES UA: NEGATIVE
Leukocytes, UA: NEGATIVE
NITRITE: NEGATIVE
Nitrite: NEGATIVE
PH: 6 (ref 5.0–8.0)
Protein, ur: >=300 mg/dL — AB
Protein, ur: >=300 mg/dL — AB
Specific Gravity, Urine: 1.014 (ref 1.005–1.030)
Specific Gravity, Urine: 1.022 (ref 1.005–1.030)
pH: 6 (ref 5.0–8.0)

## 2018-03-19 LAB — CBC WITH DIFFERENTIAL/PLATELET
Basophils Absolute: 0 10*3/uL (ref 0.0–0.1)
Basophils Relative: 0 %
Eosinophils Absolute: 0.1 10*3/uL (ref 0.0–0.7)
Eosinophils Relative: 1 %
HCT: 33.2 % — ABNORMAL LOW (ref 36.0–46.0)
HEMOGLOBIN: 11.4 g/dL — AB (ref 12.0–15.0)
LYMPHS ABS: 1.6 10*3/uL (ref 0.7–4.0)
LYMPHS PCT: 12 %
MCH: 26.1 pg (ref 26.0–34.0)
MCHC: 34.3 g/dL (ref 30.0–36.0)
MCV: 76 fL — ABNORMAL LOW (ref 78.0–100.0)
MONOS PCT: 6 %
Monocytes Absolute: 0.8 10*3/uL (ref 0.1–1.0)
Neutro Abs: 11 10*3/uL — ABNORMAL HIGH (ref 1.7–7.7)
Neutrophils Relative %: 81 %
Platelets: 297 10*3/uL (ref 150–400)
RBC: 4.37 MIL/uL (ref 3.87–5.11)
RDW: 13.7 % (ref 11.5–15.5)
WBC: 13.5 10*3/uL — AB (ref 4.0–10.5)

## 2018-03-19 LAB — BLOOD GAS, VENOUS
ACID-BASE DEFICIT: 1.5 mmol/L (ref 0.0–2.0)
Bicarbonate: 23.9 mmol/L (ref 20.0–28.0)
O2 SAT: 40.3 %
PATIENT TEMPERATURE: 37
pCO2, Ven: 45.2 mmHg (ref 44.0–60.0)
pH, Ven: 7.343 (ref 7.250–7.430)

## 2018-03-19 LAB — CBG MONITORING, ED
GLUCOSE-CAPILLARY: 381 mg/dL — AB (ref 70–99)
Glucose-Capillary: 245 mg/dL — ABNORMAL HIGH (ref 70–99)

## 2018-03-19 LAB — COMPREHENSIVE METABOLIC PANEL
ALT: 13 U/L (ref 0–44)
AST: 20 U/L (ref 15–41)
Albumin: 3 g/dL — ABNORMAL LOW (ref 3.5–5.0)
Alkaline Phosphatase: 132 U/L — ABNORMAL HIGH (ref 38–126)
Anion gap: 11 (ref 5–15)
BUN: 14 mg/dL (ref 6–20)
CHLORIDE: 104 mmol/L (ref 98–111)
CO2: 24 mmol/L (ref 22–32)
Calcium: 8.9 mg/dL (ref 8.9–10.3)
Creatinine, Ser: 1.01 mg/dL — ABNORMAL HIGH (ref 0.44–1.00)
GFR calc non Af Amer: >60 mL/min (ref >60)
GLUCOSE: 76 mg/dL (ref 70–99)
Potassium: 3.9 mmol/L (ref 3.5–5.1)
Sodium: 139 mmol/L (ref 135–145)
Total Bilirubin: 0.4 mg/dL (ref 0.3–1.2)
Total Protein: 7 g/dL (ref 6.5–8.1)

## 2018-03-19 LAB — I-STAT CHEM 8, ED
BUN: 9 mg/dL (ref 6–20)
CALCIUM ION: 1.1 mmol/L — AB (ref 1.15–1.40)
Chloride: 106 mmol/L (ref 98–111)
Creatinine, Ser: 0.9 mg/dL (ref 0.44–1.00)
GLUCOSE: 280 mg/dL — AB (ref 70–99)
HCT: 31 % — ABNORMAL LOW (ref 36.0–46.0)
Hemoglobin: 10.5 g/dL — ABNORMAL LOW (ref 12.0–15.0)
Potassium: 3.8 mmol/L (ref 3.5–5.1)
SODIUM: 135 mmol/L (ref 135–145)
TCO2: 19 mmol/L — AB (ref 22–32)

## 2018-03-19 LAB — LIPASE, BLOOD: Lipase: 21 U/L (ref 11–51)

## 2018-03-19 LAB — BASIC METABOLIC PANEL
Anion gap: 15 (ref 5–15)
BUN: 11 mg/dL (ref 6–20)
CHLORIDE: 103 mmol/L (ref 98–111)
CO2: 20 mmol/L — AB (ref 22–32)
CREATININE: 1.1 mg/dL — AB (ref 0.44–1.00)
Calcium: 9.2 mg/dL (ref 8.9–10.3)
GFR calc Af Amer: >60 mL/min (ref >60)
GFR calc non Af Amer: >60 mL/min (ref >60)
GLUCOSE: 276 mg/dL — AB (ref 70–99)
POTASSIUM: 3.9 mmol/L (ref 3.5–5.1)
SODIUM: 138 mmol/L (ref 135–145)

## 2018-03-19 LAB — CBC
HEMATOCRIT: 32.7 % — AB (ref 36.0–46.0)
Hemoglobin: 11.4 g/dL — ABNORMAL LOW (ref 12.0–15.0)
MCH: 26.4 pg (ref 26.0–34.0)
MCHC: 34.9 g/dL (ref 30.0–36.0)
MCV: 75.7 fL — ABNORMAL LOW (ref 78.0–100.0)
Platelets: 280 10*3/uL (ref 150–400)
RBC: 4.32 MIL/uL (ref 3.87–5.11)
RDW: 13.8 % (ref 11.5–15.5)
WBC: 14.5 10*3/uL — AB (ref 4.0–10.5)

## 2018-03-19 LAB — I-STAT BETA HCG BLOOD, ED (MC, WL, AP ONLY)

## 2018-03-19 MED ORDER — SODIUM CHLORIDE 0.9 % IV BOLUS
1000.0000 mL | Freq: Once | INTRAVENOUS | Status: AC
  Administered 2018-03-19: 1000 mL via INTRAVENOUS

## 2018-03-19 MED ORDER — POTASSIUM CHLORIDE 10 MEQ/100ML IV SOLN
10.0000 meq | INTRAVENOUS | Status: AC
  Administered 2018-03-20 (×2): 10 meq via INTRAVENOUS
  Filled 2018-03-19 (×2): qty 100

## 2018-03-19 MED ORDER — HYDROMORPHONE HCL 1 MG/ML IJ SOLN
1.0000 mg | Freq: Once | INTRAMUSCULAR | Status: AC
  Administered 2018-03-19: 1 mg via INTRAMUSCULAR
  Filled 2018-03-19: qty 1

## 2018-03-19 MED ORDER — SODIUM CHLORIDE 0.9 % IV SOLN
INTRAVENOUS | Status: DC
  Administered 2018-03-19: via INTRAVENOUS

## 2018-03-19 MED ORDER — ENOXAPARIN SODIUM 40 MG/0.4ML ~~LOC~~ SOLN
40.0000 mg | Freq: Every day | SUBCUTANEOUS | Status: DC
  Administered 2018-03-20 – 2018-03-23 (×4): 40 mg via SUBCUTANEOUS
  Filled 2018-03-19 (×4): qty 0.4

## 2018-03-19 MED ORDER — ONDANSETRON HCL 4 MG/2ML IJ SOLN
4.0000 mg | Freq: Once | INTRAMUSCULAR | Status: AC
  Administered 2018-03-19: 4 mg via INTRAVENOUS
  Filled 2018-03-19: qty 2

## 2018-03-19 MED ORDER — IOPAMIDOL (ISOVUE-300) INJECTION 61%
INTRAVENOUS | Status: AC
  Filled 2018-03-19: qty 100

## 2018-03-19 MED ORDER — IOPAMIDOL (ISOVUE-300) INJECTION 61%
100.0000 mL | Freq: Once | INTRAVENOUS | Status: AC | PRN
  Administered 2018-03-19: 100 mL via INTRAVENOUS

## 2018-03-19 MED ORDER — PROMETHAZINE HCL 25 MG/ML IJ SOLN
25.0000 mg | Freq: Once | INTRAMUSCULAR | Status: AC
  Administered 2018-03-19: 25 mg via INTRAMUSCULAR
  Filled 2018-03-19: qty 1

## 2018-03-19 MED ORDER — ONDANSETRON HCL 4 MG/2ML IJ SOLN
4.0000 mg | Freq: Four times a day (QID) | INTRAMUSCULAR | Status: DC | PRN
  Administered 2018-03-20 – 2018-03-21 (×4): 4 mg via INTRAVENOUS
  Filled 2018-03-19 (×4): qty 2

## 2018-03-19 MED ORDER — SODIUM CHLORIDE 0.9 % IV BOLUS
1000.0000 mL | Freq: Once | INTRAVENOUS | Status: AC
Start: 2018-03-19 — End: 2018-03-19
  Administered 2018-03-19: 1000 mL via INTRAVENOUS

## 2018-03-19 MED ORDER — SODIUM CHLORIDE 0.9 % IV SOLN
INTRAVENOUS | Status: DC
  Administered 2018-03-19: 1.5 [IU]/h via INTRAVENOUS
  Filled 2018-03-19: qty 1

## 2018-03-19 MED ORDER — ONDANSETRON HCL 4 MG PO TABS: 4.0000 mg | ORAL_TABLET | Freq: Four times a day (QID) | ORAL | 0 refills | Status: DC

## 2018-03-19 MED ORDER — DEXTROSE-NACL 5-0.45 % IV SOLN
INTRAVENOUS | Status: DC
  Administered 2018-03-20 (×2): via INTRAVENOUS

## 2018-03-19 NOTE — H&P (Signed)
History and Physical    Kimberly [name] CBJ:[id number] DOB: [birth year] DOA: 03/19/2018  PCP: Foye Spurling, MD  Patient coming from: Home  I have personally briefly reviewed patient's old medical records in [place]  Chief Complaint: N/V  HPI: Kimberly [name] is a 27 y.o. female with medical history significant of DM1.  Patient presents to ED with c/o continued vomiting and abd pain.  Treated earlier today for vomiting and abd pain, CT scan was negative.  Pain is generalized.  Aching, 3/10.  Associated vomiting which has persisted despite zofran as outpt.   ED Course: Now with [facility] in urine that wernt there earlier.  BGL 280.  AG borderline at 15.   Review of Systems: As per HPI otherwise 10 point review of systems negative.   Past Medical History:  Diagnosis Date  . Diabetes mellitus without complication (Kimberly [name])   . Preeclampsia   . Preeclampsia without severe features   . Sickle cell trait Kimberly [name])     Past Surgical History:  Procedure Laterality Date  . WISDOM TOOTH EXTRACTION       reports that she has never smoked. She has never used smokeless tobacco. She reports that she does not drink alcohol or use drugs.  No Known Allergies  Family History  Problem Relation Age of Onset  . Lupus Mother   . Aneurysm Mother      Prior to Admission medications   Medication Sig Start Date End Date Taking? Authorizing Provider  BAYER CONTOUR NEXT TEST test strip USE AS DIRECTED TO TEST 6 TO 8 TIMES DAILY 10/01/14   Woodroe Mode, MD  Blood Glucose Monitoring Suppl (CONTOUR NEXT EZ MONITOR) W/DEVICE KIT 1 each by Does not apply route 6 (six) times daily. Provide lancets for testing six times daily. Type I DM with diabetes on Insulin Pump  DX 648.03 06/17/13   Woodroe Mode, MD  etonogestrel (NEXPLANON) 68 MG IMPL implant Inject 1 each into the skin once.    [provider]  Insulin NPH Human, Isophane, (NOVOLIN N [place]) Inject 22 Units into the skin daily.      [provider]  Insulin Regular Human (NOVOLIN R IJ) Inject as directed. Sliding Scale: 0-50 is 2 units, 50-100 is 4 units, etc.    [provider]  lisinopril (PRINIVIL,ZESTRIL) 5 MG tablet Take 5 mg by mouth daily.    [provider]  ondansetron (ZOFRAN) 4 MG tablet Take 1 tablet (4 mg total) by mouth every 6 (six) hours. 03/19/18   Kimberly Rasmussen, MD    Physical Exam: Vitals:   03/19/18 2226 03/19/18 2230 03/19/18 2234 03/19/18 2303  BP: (!) 191/109 (!) 183/100 (!) 182/102 (!) 146/85  Pulse: (!) 104 (!) 104 (!) 106 (!) 110  Resp: 16   16  Temp:      TempSrc:      SpO2: 100% 100% 94% 100%  Weight:      Height:        Constitutional: NAD, calm, comfortable Eyes: PERRL, lids and conjunctivae normal ENMT: Mucous membranes are moist. Posterior pharynx clear of any exudate or lesions.Normal dentition.  Neck: normal, supple, no masses, no thyromegaly Respiratory: clear to auscultation bilaterally, no wheezing, no crackles. Normal respiratory effort. No accessory muscle use.  Cardiovascular: Regular rate and rhythm, no murmurs / rubs / gallops. No extremity edema. 2+ pedal pulses. No carotid bruits.  Abdomen: no tenderness, no masses palpated. No hepatosplenomegaly. Bowel sounds positive.  Musculoskeletal: no  clubbing / cyanosis. No joint deformity upper and lower extremities. Good ROM, no contractures. Normal muscle tone.  Skin: no rashes, lesions, ulcers. No induration Neurologic: CN 2-12 grossly intact. Sensation intact, DTR normal. Strength 5/5 in all 4.  Psychiatric: Normal judgment and insight. Alert and oriented x 3. Normal mood.    Labs on Admission: I have personally reviewed following labs and imaging studies  CBC: Recent Labs  Lab 03/19/18 0914 03/19/18 2219 03/19/18 2226  WBC 13.5* 14.5*  --   NEUTROABS 11.0*  --   --   HGB 11.4* 11.4* 10.5*  HCT 33.2* 32.7* 31.0*  MCV 76.0* 75.7*  --   PLT 297 280  --    Basic Metabolic  Panel: Recent Labs  Lab 03/19/18 0914 03/19/18 2219 03/19/18 2226  NA 139 138 135  K 3.9 3.9 3.8  CL 104 103 106  CO2 24 20*  --   GLUCOSE 76 276* 280*  BUN 14 11 9   CREATININE 1.01* 1.10* 0.90  CALCIUM 8.9 9.2  --    GFR: Estimated Creatinine Clearance: 91 mL/min (by C-G formula based on SCr of 0.9 mg/dL). Liver Function Tests: Recent Labs  Lab 03/19/18 0914  AST 20  ALT 13  ALKPHOS 132*  BILITOT 0.4  PROT 7.0  ALBUMIN 3.0*   Recent Labs  Lab 03/19/18 0914  LIPASE 21   No results for input(s): AMMONIA in the last 168 hours. Coagulation Profile: No results for input(s): INR, PROTIME in the last 168 hours. Cardiac Enzymes: No results for input(s): CKTOTAL, CKMB, CKMBINDEX, TROPONINI in the last 168 hours. BNP (last 3 results) No results for input(s): PROBNP in the last 8760 hours. HbA1C: No results for input(s): HGBA1C in the last 72 hours. CBG: Recent Labs  Lab 03/19/18 1938 03/19/18 2226  GLUCAP 381* 245*   Lipid Profile: No results for input(s): CHOL, HDL, LDLCALC, TRIG, CHOLHDL, LDLDIRECT in the last 72 hours. Thyroid Function Tests: No results for input(s): TSH, T4TOTAL, FREET4, T3FREE, THYROIDAB in the last 72 hours. Anemia Panel: No results for input(s): VITAMINB12, FOLATE, FERRITIN, TIBC, IRON, RETICCTPCT in the last 72 hours. Urine analysis:    Component Value Date/Time   COLORURINE YELLOW 03/19/2018 1942   APPEARANCEUR CLEAR 03/19/2018 1942   LABSPEC 1.022 03/19/2018 1942   PHURINE 6.0 03/19/2018 1942   GLUCOSEU >=500 (A) 03/19/2018 1942   HGBUR SMALL (A) 03/19/2018 1942   BILIRUBINUR NEGATIVE 03/19/2018 1942   KETONESUR 20 (A) 03/19/2018 1942   PROTEINUR >=300 (A) 03/19/2018 1942   UROBILINOGEN 0.2 03/21/2015 2010   NITRITE NEGATIVE 03/19/2018 1942   LEUKOCYTESUR NEGATIVE 03/19/2018 1942    Radiological Exams on Admission: Ct Abdomen Pelvis W Contrast  Result Date: 03/19/2018 CLINICAL DATA:  Left-sided abdominal pain radiating into  groin. EXAM: CT ABDOMEN AND PELVIS WITH CONTRAST TECHNIQUE: Multidetector CT imaging of the abdomen and pelvis was performed using the standard protocol following bolus administration of intravenous contrast. CONTRAST:  122m ISOVUE-300 IOPAMIDOL (ISOVUE-300) INJECTION 61% COMPARISON:  03/18/2015 FINDINGS: Lower chest: No acute abnormality. Hepatobiliary: The liver is unremarkable. Stable small dependent calcifications in the gallbladder consistent with calculi. No biliary ductal dilatation. Pancreas: Stable pancreatic atrophy. No evidence of pancreatitis, pancreatic ductal dilatation or mass. Spleen: Normal in size without focal abnormality. Adrenals/Urinary Tract: Adrenal glands are unremarkable. Kidneys are normal, without renal calculi, focal lesion, or hydronephrosis. Bladder is unremarkable. Stomach/Bowel: Bowel shows no evidence of obstruction, ileus, inflammation or lesion. No free air. No focal abscess. Vascular/Lymphatic: No significant vascular findings  are present. No enlarged abdominal or pelvic lymph nodes. Reproductive: Uterus and bilateral adnexa are unremarkable. Other: No abdominal wall hernia or abnormality. No abdominopelvic ascites. Musculoskeletal: No acute or significant osseous findings. IMPRESSION: No acute findings in the abdomen or pelvis. Stable appearance of small gallstones. Electronically Signed   By: Aletta Edouard M.D.   On: 03/19/2018 11:33   Dg Chest Port 1 View  Result Date: 03/19/2018 CLINICAL DATA:  Short of breath EXAM: PORTABLE CHEST 1 VIEW COMPARISON:  03/18/2015 FINDINGS: No acute opacity or pleural effusion. Normal heart size. No pneumothorax. IMPRESSION: No active disease. Electronically Signed   By: Donavan Foil M.D.   On: 03/19/2018 22:14    EKG: Independently reviewed.  Assessment/Plan Active Problems:   DKA, type 1 ([place])    1. Mild / Early DKA type 1 - 1. DKA pathway 2. IVF: per pathway 3. Insulin gtt 4. BMP Q4H 2. N/V - 1. Possibly due to DKA? Vs  gastroenteritis 2. Zofran 3. See if this persists despite treatment of DKA 4. CT neg  DVT prophylaxis: Lovenox Code Status: Full Family Communication: No family in room Disposition Plan: Home after admit Consults called: None Admission status: Admit to inpatient - IP status for treatment of DKA   Etta Quill DO [facility] Pager [phone] Only works nights!  If 7AM-7PM, please contact the primary day team physician taking care of patient  [email or web address] Password [facility]  03/19/2018, 11:46 PM

## 2018-03-19 NOTE — ED Provider Notes (Signed)
[facility] DEPT Provider Note   CSN: [id number] Arrival date & time: 03/19/18  6294     History   Chief Complaint Chief Complaint  Patient presents with  . Abdominal Pain  . Emesis    HPI Kimberly [name] is a 27 y.o. female.  She has been stable and diabetic with 2 prior episodes of DKA.  Her baseline sugars run to 250s.  She said she started with a minimally productive cough that she thought was sinus drainage about a week ago.  For the last 2 days she has had some left-sided abdominal discomfort from her left upper quadrant down into her groin with a sense of fullness that causes her difficulty lying down or buttoning her pants.  She that she has not moved her bowels in a couple days without not unusual for her.  She is had 2 episodes of vomiting 1 of which was bile tinged.  No fevers no chills.  No chest pain no shortness of breath no urinary symptoms no vaginal bleeding or discharge.  No prior history of abdominal surgeries.  She has a history of fatty liver and has  been told she may have gallbladder problems in the future so she decided to get this checked out.  She has tried nothing for it.  The history is provided by the patient.  Abdominal Pain   This is a new problem. The current episode started yesterday. The problem occurs constantly. The problem has not changed since onset.The pain is associated with an unknown factor. The pain is located in the LUQ, LLQ and suprapubic region. The pain is moderate. Associated symptoms include nausea, vomiting and constipation. Pertinent negatives include anorexia, fever, belching, diarrhea, flatus, hematochezia, melena, dysuria, frequency, hematuria, headaches, arthralgias and myalgias. The symptoms are aggravated by certain positions and activity. Nothing relieves the symptoms.  Emesis   Associated symptoms include abdominal pain. Pertinent negatives include no arthralgias, no chills, no cough, no diarrhea, no  fever, no headaches and no myalgias.    Past Medical History:  Diagnosis Date  . Diabetes mellitus without complication ([place])   . Preeclampsia   . Preeclampsia without severe features   . Sickle cell trait [facility])     Patient Active Problem List   Diagnosis Date Noted  . CIN III (cervical intraepithelial neoplasia grade III) with severe dysplasia 09/09/2017  . DKA, type 1 ([place]) 03/17/2015  . Diabetic nephropathy associated with type 1 diabetes mellitus ([place]) 03/17/2015  . Type 1 diabetes mellitus with diabetic nephropathy ([place]) 05/25/2013    Past Surgical History:  Procedure Laterality Date  . WISDOM TOOTH EXTRACTION       OB History    Gravida  1   Para  1   Term      Preterm  1   AB      Living  1     SAB      TAB      Ectopic      Multiple      Live Births  1            Home Medications    Prior to Admission medications   Medication Sig Start Date End Date Taking? Authorizing Provider  BAYER CONTOUR NEXT TEST test strip USE AS DIRECTED TO TEST 6 TO 8 TIMES DAILY 10/01/14   Woodroe Mode, MD  Blood Glucose Monitoring Suppl (CONTOUR NEXT EZ MONITOR) W/DEVICE KIT 1 each by Does not apply route 6 (six) times  daily. Provide lancets for testing six times daily. Type I DM with diabetes on Insulin Pump  DX 648.03 06/17/13   Woodroe Mode, MD  etonogestrel (NEXPLANON) 68 MG IMPL implant Inject 1 each into the skin once.    [provider]  Insulin NPH Human, Isophane, (NOVOLIN N [place]) Inject into the skin.    [provider]  Insulin Regular Human (NOVOLIN R IJ) Inject as directed.    [provider]    Family History Family History  Problem Relation Age of Onset  . Lupus Mother   . Aneurysm Mother     Social History Social History   Tobacco Use  . Smoking status: Never Smoker  . Smokeless tobacco: Never Used  Substance Use Topics  . Alcohol use: No  . Drug use: No     Allergies   Patient has no known  allergies.   Review of Systems Review of Systems  Constitutional: Negative for chills and fever.  HENT: Negative for sore throat.   Eyes: Negative for visual disturbance.  Respiratory: Negative for cough and shortness of breath.   Cardiovascular: Negative for chest pain.  Gastrointestinal: Positive for abdominal pain, constipation, nausea and vomiting. Negative for anorexia, diarrhea, flatus, hematochezia and melena.  Genitourinary: Negative for dysuria, frequency and hematuria.  Musculoskeletal: Negative for arthralgias and myalgias.  Skin: Negative for rash.  Neurological: Negative for headaches.     Physical Exam Updated Vital Signs Ht 5' 5"  (1.651 m)   Wt 70.8 kg (156 lb)   LMP 03/12/2018   BMI 25.96 kg/m   Physical Exam  Constitutional: She appears well-developed and well-nourished. No distress.  HENT:  Head: Normocephalic and atraumatic.  Eyes: Conjunctivae are normal.  Neck: Neck supple.  Cardiovascular: Normal rate and regular rhythm.  No murmur heard. Pulmonary/Chest: Effort normal and breath sounds normal. No respiratory distress.  Abdominal: Soft. There is tenderness in the left upper quadrant and left lower quadrant. There is no rigidity, no rebound, no guarding and no CVA tenderness.  Musculoskeletal: She exhibits no edema, tenderness or deformity.  Neurological: She is alert.  Skin: Skin is warm and dry. Capillary refill takes less than 2 seconds.  Psychiatric: She has a normal mood and affect.  Nursing note and vitals reviewed.    ED Treatments / Results  Labs (all labs ordered are listed, but only abnormal results are displayed) Labs Reviewed  COMPREHENSIVE METABOLIC PANEL - Abnormal; Notable for the following components:      Result Value   Creatinine, Ser 1.01 (*)    Albumin 3.0 (*)    Alkaline Phosphatase 132 (*)    All other components within normal limits  CBC WITH DIFFERENTIAL/PLATELET - Abnormal; Notable for the following components:   WBC  13.5 (*)    Hemoglobin 11.4 (*)    HCT 33.2 (*)    MCV 76.0 (*)    Neutro Abs 11.0 (*)    All other components within normal limits  URINALYSIS, ROUTINE W REFLEX MICROSCOPIC - Abnormal; Notable for the following components:   Protein, ur >=300 (*)    Bacteria, UA RARE (*)    All other components within normal limits  LIPASE, BLOOD  BLOOD GAS, VENOUS  I-STAT BETA HCG BLOOD, ED ([facility], [facility], [facility] ONLY)    EKG None  Radiology Ct Abdomen Pelvis W Contrast  Result Date: 03/19/2018 CLINICAL DATA:  Left-sided abdominal pain radiating into groin. EXAM: CT ABDOMEN AND PELVIS WITH CONTRAST TECHNIQUE: Multidetector CT imaging of the abdomen  and pelvis was performed using the standard protocol following bolus administration of intravenous contrast. CONTRAST:  118m ISOVUE-300 IOPAMIDOL (ISOVUE-300) INJECTION 61% COMPARISON:  03/18/2015 FINDINGS: Lower chest: No acute abnormality. Hepatobiliary: The liver is unremarkable. Stable small dependent calcifications in the gallbladder consistent with calculi. No biliary ductal dilatation. Pancreas: Stable pancreatic atrophy. No evidence of pancreatitis, pancreatic ductal dilatation or mass. Spleen: Normal in size without focal abnormality. Adrenals/Urinary Tract: Adrenal glands are unremarkable. Kidneys are normal, without renal calculi, focal lesion, or hydronephrosis. Bladder is unremarkable. Stomach/Bowel: Bowel shows no evidence of obstruction, ileus, inflammation or lesion. No free air. No focal abscess. Vascular/Lymphatic: No significant vascular findings are present. No enlarged abdominal or pelvic lymph nodes. Reproductive: Uterus and bilateral adnexa are unremarkable. Other: No abdominal wall hernia or abnormality. No abdominopelvic ascites. Musculoskeletal: No acute or significant osseous findings. IMPRESSION: No acute findings in the abdomen or pelvis. Stable appearance of small gallstones. Electronically Signed   By: GAletta EdouardM.D.   On: 03/19/2018 11:33    Dg Chest Port 1 View  Result Date: 03/19/2018 CLINICAL DATA:  Short of breath EXAM: PORTABLE CHEST 1 VIEW COMPARISON:  03/18/2015 FINDINGS: No acute opacity or pleural effusion. Normal heart size. No pneumothorax. IMPRESSION: No active disease. Electronically Signed   By: KDonavan FoilM.D.   On: 03/19/2018 22:14    Procedures Procedures (including critical care time)  Medications Ordered in ED Medications  sodium chloride 0.9 % bolus 1,000 mL (has no administration in time range)  ondansetron (ZOFRAN) injection 4 mg (has no administration in time range)     Initial Impression / Assessment and Plan / ED Course  I have reviewed the triage vital signs and the nursing notes.  Pertinent labs & imaging results that were available during my care of the patient were reviewed by me and considered in my medical decision making (see chart for details).  Clinical Course as of Mar 21 1131  Mon Mar 19, 2018  0855 IDDM with  1 week of URI symptoms now with left-sided abdominal pain  since yesterday.  She appears very nontoxic and I doubt this is DKA but would be at risk with a history of DKA.  Her exam she is very soft without any masses guarding or rebound.  She is getting some screening labs including urinalysis blood work pregnancy test and a VBG to check her acid-base status.  We will get IV fluids.  She possibly will need a CT to exclude diverticulitis or another intra-abdominal process.   [MB]  [id number]'s work-up has been fairly unremarkable.  She has a slight elevation her white count and her alk phos is elevated but otherwise no gap no acidosis.  Patency test is negative.  She had a CT that showed no acute findings.  She is comfortable with going home and trying a laxative as she has had trouble with constipation.  She understands to follow-up with her doctor and return if any worsening symptoms.   [MB]    Clinical Course User Index [MB] BHayden Rasmussen MD     Final Clinical  Impressions(s) / ED Diagnoses   Final diagnoses:  Left lower quadrant pain    ED Discharge Orders        Ordered    ondansetron (ZOFRAN) 4 MG tablet  Every 6 hours     03/19/18 1247       BHayden Rasmussen MD 03/20/18 1132

## 2018-03-19 NOTE — ED Notes (Signed)
Attempted blood draw x1 Unsuccessful Pt states she normally has an U/S IV start RN notified

## 2018-03-19 NOTE — Discharge Instructions (Signed)
You were evaluated in the emergency department for some left-sided abdominal pain.  You had blood work and a CAT scan that did not show an obvious cause of your symptoms.  We are prescribing you some Zofran to help with nausea.  You can try a laxative and fiber to see if that helps her symptoms.  Please follow-up with your doctor and return if any worsening symptoms.

## 2018-03-19 NOTE — ED Triage Notes (Signed)
PT BIB EMS C/O LOWER ABDOMINAL PAIN N/V X2 DAYS. PT SEEN HERE THIS AM. PT NOT GETTING ANY BETTER, SO SHE RETURNED. PT REFUSED IV ZOFRAN FROM EMS.

## 2018-03-19 NOTE — ED Provider Notes (Signed)
[place] DEPT Provider Note   CSN: [id number] Arrival date & time: 03/19/18  1927     History   Chief Complaint Chief Complaint  Patient presents with  . Abdominal Pain  . Emesis    HPI Kimberly [name] is a 27 y.o. female.  Patient complains of continued vomiting of abdominal pain.  She was seen earlier today and treated and had a CT scan that was negative.  Patient has history of diabetes and sickle trait  The history is provided by the patient. No language interpreter was used.  Abdominal Pain   This is a new problem. The current episode started yesterday. The problem occurs constantly. The problem has not changed since onset.The pain is associated with an unknown factor. The pain is located in the generalized abdominal region. The quality of the pain is aching. The pain is at a severity of 3/10. The pain is moderate. Associated symptoms include vomiting. Pertinent negatives include diarrhea, frequency, hematuria and headaches. Nothing aggravates the symptoms.  Emesis   Associated symptoms include abdominal pain. Pertinent negatives include no cough, no diarrhea and no headaches.    Past Medical History:  Diagnosis Date  . Diabetes mellitus without complication ([place])   . Preeclampsia   . Preeclampsia without severe features   . Sickle cell trait [facility])     Patient Active Problem List   Diagnosis Date Noted  . CIN III (cervical intraepithelial neoplasia grade III) with severe dysplasia 09/09/2017  . DKA, type 1 ([place]) 03/17/2015  . Diabetic nephropathy associated with type 1 diabetes mellitus ([place]) 03/17/2015  . Type 1 diabetes mellitus with diabetic nephropathy ([place]) 05/25/2013    Past Surgical History:  Procedure Laterality Date  . WISDOM TOOTH EXTRACTION       OB History    Gravida  1   Para  1   Term      Preterm  1   AB      Living  1     SAB      TAB      Ectopic      Multiple      Live Births  1              Home Medications    Prior to Admission medications   Medication Sig Start Date End Date Taking? Authorizing Provider  BAYER CONTOUR NEXT TEST test strip USE AS DIRECTED TO TEST 6 TO 8 TIMES DAILY 10/01/14   Woodroe Mode, MD  Blood Glucose Monitoring Suppl (CONTOUR NEXT EZ MONITOR) W/DEVICE KIT 1 each by Does not apply route 6 (six) times daily. Provide lancets for testing six times daily. Type I DM with diabetes on Insulin Pump  DX 648.03 06/17/13   Woodroe Mode, MD  etonogestrel (NEXPLANON) 68 MG IMPL implant Inject 1 each into the skin once.    [provider]  Insulin NPH Human, Isophane, (NOVOLIN N [place]) Inject 22 Units into the skin daily.     [provider]  Insulin Regular Human (NOVOLIN R IJ) Inject as directed. Sliding Scale: 0-50 is 2 units, 50-100 is 4 units, etc.    [provider]  lisinopril (PRINIVIL,ZESTRIL) 5 MG tablet Take 5 mg by mouth daily.    [provider]  ondansetron (ZOFRAN) 4 MG tablet Take 1 tablet (4 mg total) by mouth every 6 (six) hours. 03/19/18   Hayden Rasmussen, MD    Family History Family History  Problem Relation Age  of Onset  . Lupus Mother   . Aneurysm Mother     Social History Social History   Tobacco Use  . Smoking status: Never Smoker  . Smokeless tobacco: Never Used  Substance Use Topics  . Alcohol use: No  . Drug use: No     Allergies   Patient has no known allergies.   Review of Systems Review of Systems  Constitutional: Negative for appetite change and fatigue.  HENT: Negative for congestion, ear discharge and sinus pressure.   Eyes: Negative for discharge.  Respiratory: Negative for cough.   Cardiovascular: Negative for chest pain.  Gastrointestinal: Positive for abdominal pain and vomiting. Negative for diarrhea.  Genitourinary: Negative for frequency and hematuria.  Musculoskeletal: Negative for back pain.  Skin: Negative for rash.  Neurological: Negative for seizures and  headaches.  Psychiatric/Behavioral: Negative for hallucinations.     Physical Exam Updated Vital Signs BP (!) 182/102   Pulse (!) 106   Temp 98.4 F (36.9 C) (Oral)   Resp 16   Ht _0  (1.651 m)   Wt 70.8 kg (156 lb)   LMP 03/12/2018 Comment: negative HCG blood test 03-19-2018  SpO2 94%   BMI 25.96 kg/m   Physical Exam  Constitutional: She is oriented to person, place, and time. She appears well-developed.  HENT:  Head: Normocephalic.  Eyes: Conjunctivae and EOM are normal. No scleral icterus.  Neck: Neck supple. No thyromegaly present.  Cardiovascular: Normal rate and regular rhythm. Exam reveals no gallop and no friction rub.  No murmur heard. Pulmonary/Chest: No stridor. She has no wheezes. She has no rales. She exhibits no tenderness.  Abdominal: She exhibits no distension. There is tenderness. There is no rebound.  Musculoskeletal: Normal range of motion. She exhibits no edema.  Lymphadenopathy:    She has no cervical adenopathy.  Neurological: She is oriented to person, place, and time. She exhibits normal muscle tone. Coordination normal.  Skin: No rash noted. No erythema.  Psychiatric: She has a normal mood and affect. Her behavior is normal.     ED Treatments / Results  Labs (all labs ordered are listed, but only abnormal results are displayed) Labs Reviewed  URINALYSIS, ROUTINE W REFLEX MICROSCOPIC - Abnormal; Notable for the following components:      Result Value   Glucose, UA >=500 (*)    Hgb urine dipstick SMALL (*)    Ketones, ur 20 (*)    Protein, ur >=300 (*)    All other components within normal limits  CBG MONITORING, ED - Abnormal; Notable for the following components:   Glucose-Capillary 381 (*)    All other components within normal limits  CBG MONITORING, ED - Abnormal; Notable for the following components:   Glucose-Capillary 245 (*)    All other components within normal limits  I-STAT CHEM 8, ED - Abnormal; Notable for the following  components:   Glucose, Bld 280 (*)    Calcium, Ion 1.10 (*)    TCO2 19 (*)    Hemoglobin 10.5 (*)    HCT 31.0 (*)    All other components within normal limits  BASIC METABOLIC PANEL  CBC    EKG None  Radiology Ct Abdomen Pelvis W Contrast  Result Date: 03/19/2018 CLINICAL DATA:  Left-sided abdominal pain radiating into groin. EXAM: CT ABDOMEN AND PELVIS WITH CONTRAST TECHNIQUE: Multidetector CT imaging of the abdomen and pelvis was performed using the standard protocol following bolus administration of intravenous contrast. CONTRAST:  119m ISOVUE-300 IOPAMIDOL (ISOVUE-300) INJECTION  61% COMPARISON:  03/18/2015 FINDINGS: Lower chest: No acute abnormality. Hepatobiliary: The liver is unremarkable. Stable small dependent calcifications in the gallbladder consistent with calculi. No biliary ductal dilatation. Pancreas: Stable pancreatic atrophy. No evidence of pancreatitis, pancreatic ductal dilatation or mass. Spleen: Normal in size without focal abnormality. Adrenals/Urinary Tract: Adrenal glands are unremarkable. Kidneys are normal, without renal calculi, focal lesion, or hydronephrosis. Bladder is unremarkable. Stomach/Bowel: Bowel shows no evidence of obstruction, ileus, inflammation or lesion. No free air. No focal abscess. Vascular/Lymphatic: No significant vascular findings are present. No enlarged abdominal or pelvic lymph nodes. Reproductive: Uterus and bilateral adnexa are unremarkable. Other: No abdominal wall hernia or abnormality. No abdominopelvic ascites. Musculoskeletal: No acute or significant osseous findings. IMPRESSION: No acute findings in the abdomen or pelvis. Stable appearance of small gallstones. Electronically Signed   By: Aletta Edouard M.D.   On: 03/19/2018 11:33   Dg Chest Port 1 View  Result Date: 03/19/2018 CLINICAL DATA:  Short of breath EXAM: PORTABLE CHEST 1 VIEW COMPARISON:  03/18/2015 FINDINGS: No acute opacity or pleural effusion. Normal heart size. No  pneumothorax. IMPRESSION: No active disease. Electronically Signed   By: Donavan Foil M.D.   On: 03/19/2018 22:14    Procedures Procedures (including critical care time)  Medications Ordered in ED Medications  sodium chloride 0.9 % bolus 1,000 mL (1,000 mLs Intravenous New Bag/Given 03/19/18 2219)  sodium chloride 0.9 % bolus 1,000 mL (has no administration in time range)  promethazine (PHENERGAN) injection 25 mg (25 mg Intramuscular Given 03/19/18 2144)  HYDROmorphone (DILAUDID) injection 1 mg (1 mg Intramuscular Given 03/19/18 2206)     Initial Impression / Assessment and Plan / ED Course  I have reviewed the triage vital signs and the nursing notes.  Pertinent labs & imaging results that were available during my care of the patient were reviewed by me and considered in my medical decision making (see chart for details).     CRITICAL CARE Performed by: Milton Ferguson Total critical care time: 35 minutes Critical care time was exclusive of separately billable procedures and treating other patients. Critical care was necessary to treat or prevent imminent or life-threatening deterioration. Critical care was time spent personally by me on the following activities: development of treatment plan with patient and/or surrogate as well as nursing, discussions with consultants, evaluation of patient's response to treatment, examination of patient, obtaining history from patient or surrogate, ordering and performing treatments and interventions, ordering and review of laboratory studies, ordering and review of radiographic studies, pulse oximetry and re-evaluation of patient's condition.  Patient with continued vomiting and early DKA she will be admitted by medicine and started on insulin drip Final Clinical Impressions(s) / ED Diagnoses   Final diagnoses:  Diabetic ketoacidosis without coma associated with diabetes mellitus due to underlying condition [facility])    ED Discharge Orders    None         Milton Ferguson, MD 03/19/18 2248

## 2018-03-19 NOTE — ED Triage Notes (Signed)
Pt c/o abd pains on left side that goes down into groin area since yesterday. Pain is worse with laying down. Pt reports some n/v but denies urinary or bowel problems.

## 2018-03-19 NOTE — Progress Notes (Signed)
ED TO INPATIENT HANDOFF REPORT  Name/Age/Gender Kimberly [name] 27 y.o. female  Code Status    Code Status Orders  (From admission, onward)        Start     Ordered   03/19/18 2313  Full code  Continuous     03/19/18 2314    Code Status History    Date Active Date Inactive Code Status Order ID Comments User Context   03/17/2015 2043 03/22/2015 1533 Full Code [id number]  Allyne Gee, MD Inpatient   10/23/2013 2142 10/25/2013 1723 Full Code [id number]  Kassie Mends, MD Inpatient   10/21/2013 1340 10/23/2013 2142 Full Code [id number]  Lorene Dy, CNM Inpatient   10/21/2013 1225 10/21/2013 1340 Full Code [id number]  Lorene Dy, CNM Inpatient      Home/SNF/Other Home  Chief Complaint N/V/A  Level of Care/Admitting Diagnosis ED Disposition    ED Disposition Condition [place] [facility] Area: [facility] [[id number]]  Level of Care: Stepdown [14]  Admit to SDU based on following criteria: Severe physiological/psychological symptoms:  Any diagnosis requiring assessment & intervention at least every 4 hours on an ongoing basis to obtain desired patient outcomes including stability and rehabilitation  Diagnosis: DKA, type 1 [facility]) [071219]  Admitting Physician: Doreatha Massed  Attending Physician: Etta Quill [phone]  Estimated length of stay: past midnight tomorrow  Certification:: I certify this patient will need inpatient services for at least 2 midnights  PT Class (Do Not Modify): Inpatient [101]  PT Acc Code (Do Not Modify): Private [1]       Medical History Past Medical History:  Diagnosis Date  . Diabetes mellitus without complication ([place])   . Preeclampsia   . Preeclampsia without severe features   . Sickle cell trait ([place])     Allergies No Known Allergies  IV Location/Drains/Wounds Patient Lines/Drains/Airways Status   Active Line/Drains/Airways    Name:   Placement date:   Placement time:   Site:   Days:   Peripheral IV 03/19/18 Right Antecubital   03/19/18    0939    Antecubital   less than 1          Labs/Imaging Results for orders placed or performed during the hospital encounter of 03/19/18 (from the past 48 hour(s))  CBG monitoring, ED     Status: Abnormal   Collection Time: 03/19/18  7:38 PM  Result Value Ref Range   Glucose-Capillary 381 (H) 70 - 99 mg/dL   Comment 1 Notify RN    Comment 2 Document in Chart   Urinalysis, Routine w reflex microscopic     Status: Abnormal   Collection Time: 03/19/18  7:42 PM  Result Value Ref Range   Color, Urine YELLOW YELLOW   APPearance CLEAR CLEAR   Specific Gravity, Urine 1.022 1.005 - 1.030   pH 6.0 5.0 - 8.0   Glucose, UA >=500 (A) NEGATIVE mg/dL   Hgb urine dipstick SMALL (A) NEGATIVE   Bilirubin Urine NEGATIVE NEGATIVE   Ketones, ur 20 (A) NEGATIVE mg/dL   Protein, ur >=300 (A) NEGATIVE mg/dL   Nitrite NEGATIVE NEGATIVE   Leukocytes, UA NEGATIVE NEGATIVE   RBC / HPF 11-20 0 - 5 RBC/hpf   WBC, UA 0-5 0 - 5 WBC/hpf   Bacteria, UA NONE SEEN NONE SEEN   Squamous Epithelial / LPF 0-5 0 - 5    Comment: Performed at [facility], [place] [address], [place], [place] [address]  Basic metabolic panel     Status: Abnormal   Collection Time: 03/19/18 10:19 PM  Result Value Ref Range   Sodium 138 135 - 145 mmol/L   Potassium 3.9 3.5 - 5.1 mmol/L   Chloride 103 98 - 111 mmol/L   CO2 20 (L) 22 - 32 mmol/L   Glucose, Bld 276 (H) 70 - 99 mg/dL   BUN 11 6 - 20 mg/dL   Creatinine, Ser 1.10 (H) 0.44 - 1.00 mg/dL   Calcium 9.2 8.9 - 10.3 mg/dL   GFR calc non Af Amer >60 >60 mL/min   GFR calc Af Amer >60 >60 mL/min    Comment: (NOTE) The eGFR has been calculated using the CKD EPI equation. This calculation has not been validated in all clinical situations. eGFR's persistently <60 mL/min signify possible Chronic Kidney Disease.    Anion gap 15 5 - 15    Comment: Performed at [facility], [place] [address] [place], [place] [address]  CBC     Status: Abnormal   Collection Time: 03/19/18 10:19 PM  Result Value Ref Range   WBC 14.5 (H) 4.0 - 10.5 K/uL   RBC 4.32 3.87 - 5.11 MIL/uL   Hemoglobin 11.4 (L) 12.0 - 15.0 g/dL   HCT 32.7 (L) 36.0 - 46.0 %   MCV 75.7 (L) 78.0 - 100.0 fL   MCH 26.4 26.0 - 34.0 pg   MCHC 34.9 30.0 - 36.0 g/dL   RDW 13.8 11.5 - 15.5 %   Platelets 280 150 - 400 K/uL    Comment: Performed at [facility], [place] [address], [place], [place] [address]  CBG monitoring, ED     Status: Abnormal   Collection Time: 03/19/18 10:26 PM  Result Value Ref Range   Glucose-Capillary 245 (H) 70 - 99 mg/dL  I-stat chem 8, ed     Status: Abnormal   Collection Time: 03/19/18 10:26 PM  Result Value Ref Range   Sodium 135 135 - 145 mmol/L   Potassium 3.8 3.5 - 5.1 mmol/L   Chloride 106 98 - 111 mmol/L   BUN 9 6 - 20 mg/dL   Creatinine, Ser 0.90 0.44 - 1.00 mg/dL   Glucose, Bld 280 (H) 70 - 99 mg/dL   Calcium, Ion 1.10 (L) 1.15 - 1.40 mmol/L   TCO2 19 (L) 22 - 32 mmol/L   Hemoglobin 10.5 (L) 12.0 - 15.0 g/dL   HCT 31.0 (L) 36.0 - 46.0 %   Ct Abdomen Pelvis W Contrast  Result Date: 03/19/2018 CLINICAL DATA:  Left-sided abdominal pain radiating into groin. EXAM: CT ABDOMEN AND PELVIS WITH CONTRAST TECHNIQUE: Multidetector CT imaging of the abdomen and pelvis was performed using the standard protocol following bolus administration of intravenous contrast. CONTRAST:  133m ISOVUE-300 IOPAMIDOL (ISOVUE-300) INJECTION 61% COMPARISON:  03/18/2015 FINDINGS: Lower chest: No acute abnormality. Hepatobiliary: The liver is unremarkable. Stable small dependent calcifications in the gallbladder consistent with calculi. No biliary ductal dilatation. Pancreas: Stable pancreatic atrophy. No evidence of pancreatitis, pancreatic ductal dilatation or mass. Spleen: Normal in size without focal abnormality. Adrenals/Urinary Tract: Adrenal glands are unremarkable. Kidneys are normal, without  renal calculi, focal lesion, or hydronephrosis. Bladder is unremarkable. Stomach/Bowel: Bowel shows no evidence of obstruction, ileus, inflammation or lesion. No free air. No focal abscess. Vascular/Lymphatic: No significant vascular findings are present. No enlarged abdominal or pelvic lymph nodes. Reproductive: Uterus and bilateral adnexa are unremarkable. Other: No abdominal wall hernia or abnormality. No abdominopelvic ascites. Musculoskeletal: No acute  or significant osseous findings. IMPRESSION: No acute findings in the abdomen or pelvis. Stable appearance of small gallstones. Electronically Signed   By: Aletta Edouard M.D.   On: 03/19/2018 11:33   Dg Chest Port 1 View  Result Date: 03/19/2018 CLINICAL DATA:  Short of breath EXAM: PORTABLE CHEST 1 VIEW COMPARISON:  03/18/2015 FINDINGS: No acute opacity or pleural effusion. Normal heart size. No pneumothorax. IMPRESSION: No active disease. Electronically Signed   By: Donavan Foil M.D.   On: 03/19/2018 22:14    Pending Labs Unresulted Labs (From admission, onward)   Start     Ordered   03/20/18 7981  Basic metabolic panel  STAT Now then every 4 hours ,   STAT     03/19/18 2314   03/19/18 2313  HIV antibody (Routine Testing)  Once,   R     03/19/18 2314      Vitals/Pain Today's Vitals   03/19/18 2234 03/19/18 2300 03/19/18 2303 03/19/18 2330  BP: (!) 182/102 (!) 146/85 (!) 146/85 125/82  Pulse: (!) 106 (!) 109 (!) 110 (!) 107  Resp:   16   Temp:      TempSrc:      SpO2: 94% 100% 100% 100%  Weight:      Height:      PainSc:        Isolation Precautions No active isolations  Medications Medications  0.9 %  sodium chloride infusion ( Intravenous New Bag/Given 03/19/18 2332)  dextrose 5 %-0.45 % sodium chloride infusion (has no administration in time range)  insulin regular (NOVOLIN R,HUMULIN R) 100 Units in sodium chloride 0.9 % 100 mL (1 Units/mL) infusion (1.5 Units/hr Intravenous New Bag/Given 03/19/18 2344)  enoxaparin  (LOVENOX) injection 40 mg (has no administration in time range)  potassium chloride 10 mEq in 100 mL IVPB (has no administration in time range)  ondansetron (ZOFRAN) injection 4 mg (has no administration in time range)  sodium chloride 0.9 % bolus 1,000 mL (1,000 mLs Intravenous New Bag/Given 03/19/18 2219)  promethazine (PHENERGAN) injection 25 mg (25 mg Intramuscular Given 03/19/18 2144)  HYDROmorphone (DILAUDID) injection 1 mg (1 mg Intramuscular Given 03/19/18 2206)  sodium chloride 0.9 % bolus 1,000 mL (1,000 mLs Intravenous New Bag/Given 03/19/18 2247)    Mobility walks

## 2018-03-19 NOTE — ED Notes (Signed)
Difficult IV stick. IV team  consulted.

## 2018-03-20 ENCOUNTER — Other Ambulatory Visit: Payer: Self-pay

## 2018-03-20 DIAGNOSIS — E0810 Diabetes mellitus due to underlying condition with ketoacidosis without coma: Secondary | ICD-10-CM

## 2018-03-20 LAB — BASIC METABOLIC PANEL
ANION GAP: 13 (ref 5–15)
Anion gap: 13 (ref 5–15)
Anion gap: 8 (ref 5–15)
Anion gap: 11 (ref 5–15)
BUN: 11 mg/dL (ref 6–20)
BUN: 9 mg/dL (ref 6–20)
BUN: 8 mg/dL (ref 6–20)
BUN: 7 mg/dL (ref 6–20)
CHLORIDE: 107 mmol/L (ref 98–111)
CO2: 19 mmol/L — ABNORMAL LOW (ref 22–32)
CO2: 22 mmol/L (ref 22–32)
CO2: 21 mmol/L — ABNORMAL LOW (ref 22–32)
CO2: 19 mmol/L — ABNORMAL LOW (ref 22–32)
CREATININE: 0.98 mg/dL (ref 0.44–1.00)
CREATININE: 0.96 mg/dL (ref 0.44–1.00)
CREATININE: 1.09 mg/dL — AB (ref 0.44–1.00)
Calcium: 8.7 mg/dL — ABNORMAL LOW (ref 8.9–10.3)
Calcium: 8.1 mg/dL — ABNORMAL LOW (ref 8.9–10.3)
Calcium: 8.2 mg/dL — ABNORMAL LOW (ref 8.9–10.3)
Calcium: 8.4 mg/dL — ABNORMAL LOW (ref 8.9–10.3)
Chloride: 109 mmol/L (ref 98–111)
Chloride: 109 mmol/L (ref 98–111)
Chloride: 107 mmol/L (ref 98–111)
Creatinine, Ser: 0.94 mg/dL (ref 0.44–1.00)
GFR calc non Af Amer: >60 mL/min (ref >60)
GLUCOSE: 165 mg/dL — AB (ref 70–99)
Glucose, Bld: 167 mg/dL — ABNORMAL HIGH (ref 70–99)
Glucose, Bld: 224 mg/dL — ABNORMAL HIGH (ref 70–99)
Glucose, Bld: 119 mg/dL — ABNORMAL HIGH (ref 70–99)
POTASSIUM: 4.4 mmol/L (ref 3.5–5.1)
POTASSIUM: 4.2 mmol/L (ref 3.5–5.1)
POTASSIUM: 3.6 mmol/L (ref 3.5–5.1)
Potassium: 3.7 mmol/L (ref 3.5–5.1)
SODIUM: 139 mmol/L (ref 135–145)
SODIUM: 139 mmol/L (ref 135–145)
SODIUM: 141 mmol/L (ref 135–145)
SODIUM: 139 mmol/L (ref 135–145)

## 2018-03-20 LAB — GLUCOSE, CAPILLARY
GLUCOSE-CAPILLARY: 141 mg/dL — AB (ref 70–99)
GLUCOSE-CAPILLARY: 164 mg/dL — AB (ref 70–99)
GLUCOSE-CAPILLARY: 168 mg/dL — AB (ref 70–99)
GLUCOSE-CAPILLARY: 208 mg/dL — AB (ref 70–99)
Glucose-Capillary: 168 mg/dL — ABNORMAL HIGH (ref 70–99)
Glucose-Capillary: 144 mg/dL — ABNORMAL HIGH (ref 70–99)
Glucose-Capillary: 137 mg/dL — ABNORMAL HIGH (ref 70–99)
Glucose-Capillary: 139 mg/dL — ABNORMAL HIGH (ref 70–99)
Glucose-Capillary: 176 mg/dL — ABNORMAL HIGH (ref 70–99)
Glucose-Capillary: 127 mg/dL — ABNORMAL HIGH (ref 70–99)
Glucose-Capillary: 84 mg/dL (ref 70–99)
Glucose-Capillary: 85 mg/dL (ref 70–99)
Glucose-Capillary: 305 mg/dL — ABNORMAL HIGH (ref 70–99)
Glucose-Capillary: 169 mg/dL — ABNORMAL HIGH (ref 70–99)

## 2018-03-20 LAB — COMPREHENSIVE METABOLIC PANEL
ALBUMIN: 3.2 g/dL — AB (ref 3.5–5.0)
ALT: 16 U/L (ref 0–44)
ANION GAP: 15 (ref 5–15)
AST: 22 U/L (ref 15–41)
Alkaline Phosphatase: 172 U/L — ABNORMAL HIGH (ref 38–126)
BUN: 8 mg/dL (ref 6–20)
CHLORIDE: 102 mmol/L (ref 98–111)
CO2: 19 mmol/L — AB (ref 22–32)
Calcium: 8.9 mg/dL (ref 8.9–10.3)
Creatinine, Ser: 0.97 mg/dL (ref 0.44–1.00)
GFR calc Af Amer: >60 mL/min (ref >60)
GFR calc non Af Amer: >60 mL/min (ref >60)
Glucose, Bld: 348 mg/dL — ABNORMAL HIGH (ref 70–99)
Potassium: 4.3 mmol/L (ref 3.5–5.1)
Sodium: 136 mmol/L (ref 135–145)
Total Bilirubin: 1 mg/dL (ref 0.3–1.2)
Total Protein: 7.9 g/dL (ref 6.5–8.1)

## 2018-03-20 LAB — MRSA PCR SCREENING: MRSA BY PCR: NEGATIVE

## 2018-03-20 LAB — HIV ANTIBODY (ROUTINE TESTING W REFLEX): HIV SCREEN 4TH GENERATION: NONREACTIVE

## 2018-03-20 LAB — LIPASE, BLOOD: LIPASE: 24 U/L (ref 11–51)

## 2018-03-20 MED ORDER — LISINOPRIL 5 MG PO TABS
5.0000 mg | ORAL_TABLET | Freq: Every day | ORAL | Status: DC
  Administered 2018-03-20 – 2018-03-21 (×2): 5 mg via ORAL
  Filled 2018-03-20 (×2): qty 2

## 2018-03-20 MED ORDER — HYDRALAZINE HCL 20 MG/ML IJ SOLN
10.0000 mg | INTRAMUSCULAR | Status: DC | PRN
  Administered 2018-03-20 – 2018-03-23 (×7): 10 mg via INTRAVENOUS
  Filled 2018-03-20 (×8): qty 1

## 2018-03-20 MED ORDER — INSULIN ASPART 100 UNIT/ML ~~LOC~~ SOLN
0.0000 [IU] | Freq: Three times a day (TID) | SUBCUTANEOUS | Status: DC
  Administered 2018-03-21: 1 [IU] via SUBCUTANEOUS
  Administered 2018-03-21: 2 [IU] via SUBCUTANEOUS
  Administered 2018-03-21 – 2018-03-22 (×2): 1 [IU] via SUBCUTANEOUS

## 2018-03-20 MED ORDER — METOCLOPRAMIDE HCL 5 MG/ML IJ SOLN
5.0000 mg | Freq: Three times a day (TID) | INTRAMUSCULAR | Status: DC
  Administered 2018-03-20: 5 mg via INTRAVENOUS
  Filled 2018-03-20: qty 2

## 2018-03-20 MED ORDER — INSULIN ASPART 100 UNIT/ML ~~LOC~~ SOLN: 5.0000 [IU] | Freq: Once | SUBCUTANEOUS | Status: DC

## 2018-03-20 MED ORDER — INSULIN ASPART 100 UNIT/ML ~~LOC~~ SOLN
0.0000 [IU] | SUBCUTANEOUS | Status: DC
  Administered 2018-03-20: 2 [IU] via SUBCUTANEOUS
  Administered 2018-03-20: 7 [IU] via SUBCUTANEOUS

## 2018-03-20 MED ORDER — LORAZEPAM 2 MG/ML IJ SOLN
2.0000 mg | Freq: Once | INTRAMUSCULAR | Status: AC
  Administered 2018-03-20: 2 mg via INTRAVENOUS
  Filled 2018-03-20: qty 1

## 2018-03-20 MED ORDER — PANTOPRAZOLE SODIUM 40 MG PO TBEC
40.0000 mg | DELAYED_RELEASE_TABLET | Freq: Two times a day (BID) | ORAL | Status: DC
  Administered 2018-03-20 – 2018-03-21 (×2): 40 mg via ORAL
  Filled 2018-03-20 (×2): qty 1

## 2018-03-20 MED ORDER — METOCLOPRAMIDE HCL 5 MG/ML IJ SOLN: 5.0000 mg | Freq: Three times a day (TID) | INTRAMUSCULAR | Status: DC

## 2018-03-20 MED ORDER — INSULIN DETEMIR 100 UNIT/ML ~~LOC~~ SOLN
15.0000 [IU] | Freq: Every day | SUBCUTANEOUS | Status: DC
  Administered 2018-03-20: 15 [IU] via SUBCUTANEOUS
  Filled 2018-03-20: qty 0.15

## 2018-03-20 MED ORDER — SODIUM CHLORIDE 0.9 % IV SOLN
INTRAVENOUS | Status: DC
  Administered 2018-03-20 – 2018-03-21 (×2): via INTRAVENOUS

## 2018-03-20 MED ORDER — PROMETHAZINE HCL 25 MG/ML IJ SOLN
12.5000 mg | Freq: Once | INTRAMUSCULAR | Status: AC
  Administered 2018-03-20: 12.5 mg via INTRAVENOUS
  Filled 2018-03-20: qty 1

## 2018-03-20 MED ORDER — INSULIN ASPART PROT & ASPART (70-30 MIX) 100 UNIT/ML ~~LOC~~ SUSP
16.0000 [IU] | Freq: Two times a day (BID) | SUBCUTANEOUS | Status: DC
  Administered 2018-03-20 (×2): 16 [IU] via SUBCUTANEOUS
  Filled 2018-03-20: qty 10

## 2018-03-20 MED ORDER — SUCRALFATE 1 GM/10ML PO SUSP
1.0000 g | Freq: Three times a day (TID) | ORAL | Status: DC
  Administered 2018-03-20 – 2018-03-23 (×11): 1 g via ORAL
  Filled 2018-03-20 (×11): qty 10

## 2018-03-20 MED ORDER — MORPHINE SULFATE (PF) 2 MG/ML IV SOLN
2.0000 mg | Freq: Once | INTRAVENOUS | Status: AC
  Administered 2018-03-20: 2 mg via INTRAVENOUS
  Filled 2018-03-20: qty 1

## 2018-03-20 MED ORDER — METOCLOPRAMIDE HCL 5 MG/ML IJ SOLN
10.0000 mg | Freq: Four times a day (QID) | INTRAMUSCULAR | Status: DC
  Administered 2018-03-20 – 2018-03-23 (×11): 10 mg via INTRAVENOUS
  Filled 2018-03-20 (×11): qty 2

## 2018-03-20 NOTE — Progress Notes (Signed)
Patient's blood pressures have been consistent in the 200s/100s range. Attending MD notified twice. This RN instructed to monitor. Daily med for blood pressure administered. See MAR.

## 2018-03-20 NOTE — Progress Notes (Addendum)
Inpatient Diabetes Program Recommendations  AACE/ADA: New Consensus Statement on Inpatient Glycemic Control (2015)  Target Ranges:  Prepandial:   less than 140 mg/dL      Peak postprandial:   less than 180 mg/dL (1-2 hours)      Critically ill patients:  140 - 180 mg/dL   Review of Glycemic Control  Diabetes history: DM 1, Sees Dr. Chalmers Cater Endocrinologist Outpatient Diabetes medications: NPH 22 units BID, Humalog SSI + Carb coverage 2 units for every 10 grams of carbs Current orders for Inpatient glycemic control: 70/30 16 units BID, Novolog 0-9 units tid  Inpatient Diabetes Program Recommendations:    Patient was ill looking and actively vomiting upon entering in room. Patient labs indicate that her DKA has cleared and that she was transitioned from IV insulin per protocol.   Patient reports seeing Dr. Chalmers Cater at the beginning of the year and sees her every 6 months. She is due for a follow up. Encouraged patient to make an appointment. Patient was on a Humalog insulin pump until she changed jobs and had different insurance and is now on NPH and regular insulin. Patient reports she can get her insulin and afford it. Per RN report patient told MD she couldn't and was switched to 70/30 while inpatient for dosing.  Patient has not been in DKA recently. Per patient report her last A1c that she has known about was over 9% around 10%, uncontrolled. Patient reports having Type 1 DM since the age of 27 years old. Patient has extensive knowledge on diagnosis, however, she does not have good control over her levels.  Consider an A1c level to determine glucose control over the past 2-3 months.   Recommend at time of d/c for patient to stay on regimen that her Endocrinologist has her on and follow up ASAP.   Thanks,  Tama Headings RN, MSN, BC-ADM, [facility] Inpatient Diabetes Coordinator Team Pager [phone] (8a-5p)

## 2018-03-20 NOTE — Progress Notes (Signed)
[facility] PROGRESS NOTE  OMAR ORREGO POL:[id number] DOB: [birth year] DOA: 03/19/2018 PCP: Foye Spurling, MD   Narrative: 27 year old female type I diabetic Prior insulin pump until 8 months ago when insurance is changed  [Follows with Dr. Chalmers Cater rhinology] Admitted with acidosis and possible DKA  A & Plan DKA versus nausea vomiting secondary to enteritis-anion gap has closed however patient has an acidosis with a bicarb in the 19 range-I have transitioned the patient off of IV insulin-she has had repeat episodes of vomiting today-we will start Reglan at a higher dose of 10 mg every 6 as needed, she is having epigastric pain and tenderness in her xiphisternal area so we will add Carafate and keep her on clear liquids She has not had any blood in her vomit so I do not think she needs GI consult just yet however I will also place her on Protonix twice daily We will change her fluids to saline 75 cc/H We will give Ativan as well We will repeat labs and get a complete metabolic panel and a lipase to rule out any pancreatic issues  Hypertensin secondary to nausea vomiting-placed on hydralazine as needed blood pressure above 160-continue lisinopril 5     Keep on stepdown today  DVT prophylaxis: Lovenox code Status: fullo   Family Communication: none   Disposition Plan: ip    Verlon Au, MD  [facility] Direct contact: [phone] --Via amion app OR  --[email or web address]; password [email or web address]  7PM-7AM contact night coverage as above 03/20/2018, 4:39 PM  LOS: 1 day   Consultants:  no  Procedures:  no  Antimicrobials:  no  Interval history/Subjective: Was doing better this morning seem to have no nausea and then on reassessment this afternoon having 2-3 episodes of bilious vomiting without blood and epigastric pain  Objective:  Vitals:  Vitals:   03/20/18 1541 03/20/18 1555  BP: (!) 224/129   Pulse:    Resp: 17   Temp:  97.9 F (36.6 C)  SpO2: 100%      Exam:  Uncomfortable sweaty appearing female no distress EOMI NCAT No icterus no pallor Abdomen is tender in the xiphisternum Chest is clear  I have personally reviewed the following:   Labs:  CO2 19 gap 13  Imaging studies:  CT scan reviewed 7/22 no distress or issues  Chest x-ray normal  Medical tests:  Reviewed  Test discussed with performing physician:  Reviewed  Decision to obtain old records:  No  Review and summation of old records:  No  Scheduled Meds: . enoxaparin (LOVENOX) injection  40 mg Subcutaneous Daily  . insulin aspart  0-9 Units Subcutaneous TID WC  . insulin detemir  15 Units Subcutaneous QHS  . lisinopril  5 mg Oral Daily  . metoCLOPramide (REGLAN) injection  10 mg Intravenous Q6H  . sucralfate  1 g Oral TID WC & HS   Continuous Infusions: . sodium chloride 75 mL/hr at 03/20/18 1637    Active Problems:   DKA, type 1 ([place])   LOS: 1 day         26 year old female type I diabetic prior use of

## 2018-03-21 DIAGNOSIS — R1084 Generalized abdominal pain: Secondary | ICD-10-CM

## 2018-03-21 DIAGNOSIS — I160 Hypertensive urgency: Secondary | ICD-10-CM

## 2018-03-21 LAB — GLUCOSE, CAPILLARY
GLUCOSE-CAPILLARY: 134 mg/dL — AB (ref 70–99)
GLUCOSE-CAPILLARY: 174 mg/dL — AB (ref 70–99)
Glucose-Capillary: 199 mg/dL — ABNORMAL HIGH (ref 70–99)
Glucose-Capillary: 133 mg/dL — ABNORMAL HIGH (ref 70–99)
Glucose-Capillary: 128 mg/dL — ABNORMAL HIGH (ref 70–99)

## 2018-03-21 LAB — BASIC METABOLIC PANEL
Anion gap: 14 (ref 5–15)
BUN: 10 mg/dL (ref 6–20)
CO2: 20 mmol/L — ABNORMAL LOW (ref 22–32)
Calcium: 8.7 mg/dL — ABNORMAL LOW (ref 8.9–10.3)
Chloride: 107 mmol/L (ref 98–111)
Creatinine, Ser: 1.15 mg/dL — ABNORMAL HIGH (ref 0.44–1.00)
GFR calc Af Amer: >60 mL/min (ref >60)
GFR calc non Af Amer: >60 mL/min (ref >60)
Glucose, Bld: 199 mg/dL — ABNORMAL HIGH (ref 70–99)
Potassium: 3.9 mmol/L (ref 3.5–5.1)
Sodium: 141 mmol/L (ref 135–145)

## 2018-03-21 MED ORDER — METOPROLOL TARTRATE 5 MG/5ML IV SOLN
5.0000 mg | Freq: Once | INTRAVENOUS | Status: AC
  Administered 2018-03-21: 5 mg via INTRAVENOUS
  Filled 2018-03-21: qty 5

## 2018-03-21 MED ORDER — FAMOTIDINE IN NACL 20-0.9 MG/50ML-% IV SOLN
20.0000 mg | Freq: Two times a day (BID) | INTRAVENOUS | Status: DC
  Administered 2018-03-21 – 2018-03-22 (×3): 20 mg via INTRAVENOUS
  Filled 2018-03-21 (×3): qty 50

## 2018-03-21 MED ORDER — KETOROLAC TROMETHAMINE 15 MG/ML IJ SOLN
15.0000 mg | Freq: Three times a day (TID) | INTRAMUSCULAR | Status: AC | PRN
  Administered 2018-03-22: 15 mg via INTRAVENOUS
  Filled 2018-03-21: qty 1

## 2018-03-21 MED ORDER — METOPROLOL TARTRATE 5 MG/5ML IV SOLN
5.0000 mg | Freq: Four times a day (QID) | INTRAVENOUS | Status: DC
  Administered 2018-03-21 – 2018-03-23 (×8): 5 mg via INTRAVENOUS
  Filled 2018-03-21 (×8): qty 5

## 2018-03-21 MED ORDER — DIPHENHYDRAMINE HCL 50 MG/ML IJ SOLN
25.0000 mg | Freq: Once | INTRAMUSCULAR | Status: AC
  Administered 2018-03-21: 25 mg via INTRAVENOUS
  Filled 2018-03-21: qty 1

## 2018-03-21 MED ORDER — INSULIN DETEMIR 100 UNIT/ML ~~LOC~~ SOLN
18.0000 [IU] | Freq: Every day | SUBCUTANEOUS | Status: DC
  Administered 2018-03-22 (×2): 18 [IU] via SUBCUTANEOUS
  Filled 2018-03-21 (×3): qty 0.18

## 2018-03-21 NOTE — Significant Event (Signed)
Rapid Response Event Note  Overview: Time Called: 2200 Arrival Time: 2205 Event Type: Other (Comment)("Panic Attack")  Initial Focused Assessment:  Called to room d/t "Panic Attack", entered room to find patient sitting up in bed, A/Ox4, only able to get a few words out per breath, however, sats 100%.  Pt stating that she is having a "Panic Attack".  Pt advised that she has never felt this way before, is having back spasms, but no other pain.  HR 120s, ST, SBP in 180s.  When I was in the room pt also nauseated, with some dry heaving noted.  Pt also voiced concerns of being very hot.  Thermostat turned down, and gown removed per patient.  Ordered Lopressor 5mg  IV given.  Also Reglan 4mg  IV given for the nausea.  BP remained elevated with SBP in the 170s after 20 min.  Hydralazine 10 mg IV given, after 10 min BP down to 158/97.  Pt states that she feels better.  No signs of distress noted.  During the 10 minutes after the Hydralazine pt was able to lay down in bed and rest.    Interventions:  VS, EKG Monitor, CBG check (127), all meds given were already scheduled or PRN  Plan of Care (if not transferred):  If any change in pt condition please call Rapid Response back @ [phone].  Event Summary:  Outcome: Stayed in room and stabalized  Event End Time: 2240  Felecia Jan ICU/SD Care Coordinator / Rapid Response Nurse Rapid Response Number:  [phone]

## 2018-03-21 NOTE — Progress Notes (Addendum)
Inpatient Diabetes Program Recommendations  AACE/ADA: New Consensus Statement on Inpatient Glycemic Control (2015)  Target Ranges:  Prepandial:   less than 140 mg/dL      Peak postprandial:   less than 180 mg/dL (1-2 hours)      Critically ill patients:  140 - 180 mg/dL   Results for Kimberly [name], Kimberly [name] (MRN [id number]) as of 03/21/2018 09:13  Ref. Range 03/20/2018 06:49 03/20/2018 07:47 03/20/2018 09:00 03/20/2018 09:49 03/20/2018 11:16 03/20/2018 11:49 03/20/2018 15:38 03/20/2018 20:09  Glucose-Capillary Latest Ref Range: 70 - 99 mg/dL 176 (H) 168 (H) 141 (H) 127 (H) 84 85 305 (H) 169 (H)   Results for Kimberly [name], Kimberly [name] (MRN [id number]) as of 03/21/2018 09:13  Ref. Range 03/21/2018 00:06 03/21/2018 07:36  Glucose-Capillary Latest Ref Range: 70 - 99 mg/dL 174 (H) 199 (H)    Home DM Meds: NPH Insulin- 22 units daily       Regular Insulin- Per SSI  Current Insulin Orders: Levemir 15 units QHS      Novolog Sensitive Correction Scale/ SSI (0-9 units) TID AC + HS       MD- Please consider the following in-hospital insulin adjustments:  1. Increase Levemir slightly to 18 units QHS (Fasting CBG this AM still slightly elevated)  2. Start Novolog Meal Coverage: Novolog 3 units TID with meals (Please add the following Hold Parameters: Hold if pt eats <50% of meal, Hold if pt NPO)      --Will follow patient during hospitalization--  Wyn Quaker RN, MSN, CDE Diabetes Coordinator Inpatient Glycemic Control Team Team Pager: [phone] (8a-5p)

## 2018-03-21 NOTE — Progress Notes (Signed)
PROGRESS NOTE [facility]   CHARLENE DETTER   HGD:[id number] DOB: [birth year]  DOA: 03/19/2018 PCP: Foye Spurling, MD   Brief Narrative:  Kimberly [name] is a 27 year old female with history significant for type 1 diabetes mellitus.  Patient presented to the ED complaining of nausea, vomiting and abdominal pain.  Upon ED evaluation found to have ketones in the urine, mild elevated anion gap and elevated glucose.  Patient was admitted with working diagnosis of mild DKA.  Subjective: Patient seen and examined,  she reported to continues to have nausea and vomiting, 3 episodes overnight.  None this morning but abdominal pain still present.  She is not really hungry.  Assessment & Plan: DKA type 1 diabetes mellitus Anion gap has closed, she initially was treated with IV insulin which was transitioned to subcutaneous Lantus and SSI.  CBGs has improved.  Lantus increased to 18 units at bedtime.  Will continue to monitor.  Abdominal pain with nausea and vomiting likely related to gastroparesis Patient on Reglan, will add Pepcid twice daily, continue Zofran as needed we will give 1 dose of Benadryl. Keep patient n.p.o. for now and start clear liquid in a.m. if no more nausea or vomiting.  Continue IV fluids, monitor electrolytes.  CT abdomen with no acute abnormalities.  Hypertensive urgency Patient on lisinopril prior to admission, this may be contributing to nausea and vomiting as well.  Will start metoprolol IV 5 mg every 6 hour. Continue Lisinopril for now, dose may need to be increase, but will do when patient tolerates PO.   DVT prophylaxis: SCD's  Code Status: Full Code  Family Communication: None at bedside Disposition Plan: Home in 1 to 2 days if patient tolerates diet well.  Consultants:   None  Procedures:   None  Antimicrobials:  None   Objective: Vitals:   03/21/18 0443 03/21/18 0500 03/21/18 0749 03/21/18 0857  BP: (!) 185/118 (!) 160/94  (!) 176/104    Pulse:      Resp:  14    Temp:   99.2 F (37.3 C)   TempSrc:   Oral   SpO2:  100%    Weight:      Height:        Intake/Output Summary (Last 24 hours) at 03/21/2018 9622 Last data filed at 03/21/2018 0900 Gross per 24 hour  Intake 2098.75 ml  Output 6 ml  Net 2092.75 ml   Filed Weights   03/19/18 1936 03/20/18 0019  Weight: 70.8 kg (156 lb) 72.9 kg (160 lb 11.5 oz)    Examination:  General exam: Appears calm and comfortable  Respiratory system: Clear to auscultation. No wheezes,crackle or rhonchi Cardiovascular system: S1 & S2 heard, RRR. No JVD, murmurs, rubs or gallops Gastrointestinal system: Soft, nondistended mild left upper quadrant tenderness.  No organomegaly. Central nervous system: Alert and oriented. No focal neurological deficits. Extremities: No pedal edema.  Skin: No rashes, lesions or ulcers Psychiatry:  Mood & affect appropriate.    Data Reviewed: I have personally reviewed following labs and imaging studies  CBC: Recent Labs  Lab 03/19/18 0914 03/19/18 2219 03/19/18 2226  WBC 13.5* 14.5*  --   NEUTROABS 11.0*  --   --   HGB 11.4* 11.4* 10.5*  HCT 33.2* 32.7* 31.0*  MCV 76.0* 75.7*  --   PLT 297 280  --    Basic Metabolic Panel: Recent Labs  Lab 03/20/18 0638 03/20/18 1010 03/20/18 1302 03/20/18 1714 03/21/18 0331  NA 139 141 139 136  141  K 4.2 3.7 3.6 4.3 3.9  CL 109 109 107 102 107  CO2 22 21* 19* 19* 20*  GLUCOSE 224* 119* 165* 348* 199*  BUN 9 8 7 8 10   CREATININE 0.96 1.09* 0.94 0.97 1.15*  CALCIUM 8.1* 8.2* 8.4* 8.9 8.7*   GFR: Estimated Creatinine Clearance: 72.2 mL/min (A) (by C-G formula based on SCr of 1.15 mg/dL (H)). Liver Function Tests: Recent Labs  Lab 03/19/18 0914 03/20/18 1714  AST 20 22  ALT 13 16  ALKPHOS 132* 172*  BILITOT 0.4 1.0  PROT 7.0 7.9  ALBUMIN 3.0* 3.2*   Recent Labs  Lab 03/19/18 0914 03/20/18 1714  LIPASE 21 24   No results for input(s): AMMONIA in the last 168 hours. Coagulation  Profile: No results for input(s): INR, PROTIME in the last 168 hours. Cardiac Enzymes: No results for input(s): CKTOTAL, CKMB, CKMBINDEX, TROPONINI in the last 168 hours. BNP (last 3 results) No results for input(s): PROBNP in the last 8760 hours. HbA1C: No results for input(s): HGBA1C in the last 72 hours. CBG: Recent Labs  Lab 03/20/18 1149 03/20/18 1538 03/20/18 2009 03/21/18 0006 03/21/18 0736  GLUCAP 85 305* 169* 174* 199*   Lipid Profile: No results for input(s): CHOL, HDL, LDLCALC, TRIG, CHOLHDL, LDLDIRECT in the last 72 hours. Thyroid Function Tests: No results for input(s): TSH, T4TOTAL, FREET4, T3FREE, THYROIDAB in the last 72 hours. Anemia Panel: No results for input(s): VITAMINB12, FOLATE, FERRITIN, TIBC, IRON, RETICCTPCT in the last 72 hours. Sepsis Labs: No results for input(s): PROCALCITON, LATICACIDVEN in the last 168 hours.  Recent Results (from the past 240 hour(s))  MRSA PCR Screening     Status: None   Collection Time: 03/20/18 12:09 AM  Result Value Ref Range Status   MRSA by PCR NEGATIVE NEGATIVE Final    Comment:        The GeneXpert MRSA Assay (FDA approved for NASAL specimens only), is one component of a comprehensive MRSA colonization surveillance program. It is not intended to diagnose MRSA infection nor to guide or monitor treatment for MRSA infections. Performed at [facility], [place] [address], [place], [place] [address]       Radiology Studies: Ct Abdomen Pelvis W Contrast  Result Date: 03/19/2018 CLINICAL DATA:  Left-sided abdominal pain radiating into groin. EXAM: CT ABDOMEN AND PELVIS WITH CONTRAST TECHNIQUE: Multidetector CT imaging of the abdomen and pelvis was performed using the standard protocol following bolus administration of intravenous contrast. CONTRAST:  177mL ISOVUE-300 IOPAMIDOL (ISOVUE-300) INJECTION 61% COMPARISON:  03/18/2015 FINDINGS: Lower chest: No acute abnormality. Hepatobiliary: The liver is  unremarkable. Stable small dependent calcifications in the gallbladder consistent with calculi. No biliary ductal dilatation. Pancreas: Stable pancreatic atrophy. No evidence of pancreatitis, pancreatic ductal dilatation or mass. Spleen: Normal in size without focal abnormality. Adrenals/Urinary Tract: Adrenal glands are unremarkable. Kidneys are normal, without renal calculi, focal lesion, or hydronephrosis. Bladder is unremarkable. Stomach/Bowel: Bowel shows no evidence of obstruction, ileus, inflammation or lesion. No free air. No focal abscess. Vascular/Lymphatic: No significant vascular findings are present. No enlarged abdominal or pelvic lymph nodes. Reproductive: Uterus and bilateral adnexa are unremarkable. Other: No abdominal wall hernia or abnormality. No abdominopelvic ascites. Musculoskeletal: No acute or significant osseous findings. IMPRESSION: No acute findings in the abdomen or pelvis. Stable appearance of small gallstones. Electronically Signed   By: Aletta Edouard M.D.   On: 03/19/2018 11:33   Dg Chest Port 1 View  Result Date: 03/19/2018 CLINICAL DATA:  Short  of breath EXAM: PORTABLE CHEST 1 VIEW COMPARISON:  03/18/2015 FINDINGS: No acute opacity or pleural effusion. Normal heart size. No pneumothorax. IMPRESSION: No active disease. Electronically Signed   By: Donavan Foil M.D.   On: 03/19/2018 22:14      Scheduled Meds: . diphenhydrAMINE  25 mg Intravenous Once  . enoxaparin (LOVENOX) injection  40 mg Subcutaneous Daily  . insulin aspart  0-9 Units Subcutaneous TID WC  . insulin aspart  5 Units Subcutaneous Once  . insulin detemir  18 Units Subcutaneous QHS  . lisinopril  5 mg Oral Daily  . metoCLOPramide (REGLAN) injection  10 mg Intravenous Q6H  . metoprolol tartrate  5 mg Intravenous Q6H  . sucralfate  1 g Oral TID WC & HS   Continuous Infusions: . sodium chloride 75 mL/hr at 03/20/18 1637  . famotidine (PEPCID) IV       LOS: 2 days    Time spent: Total of 35  minutes spent with pt, greater than 50% of which was spent in discussion of  treatment, counseling and coordination of care   Chipper Oman, MD Pager: Text Page via [email or web address]   If 7PM-7AM, please contact night-coverage [email or web address] 03/21/2018, 9:22 AM   Note - This record has been created using [employer]. Chart creation errors have been sought, but may not always have been located. Such creation errors do not reflect on the standard of medical care.

## 2018-03-22 LAB — MAGNESIUM: MAGNESIUM: 2 mg/dL (ref 1.7–2.4)

## 2018-03-22 LAB — GLUCOSE, CAPILLARY
GLUCOSE-CAPILLARY: 139 mg/dL — AB (ref 70–99)
GLUCOSE-CAPILLARY: 87 mg/dL (ref 70–99)
GLUCOSE-CAPILLARY: 162 mg/dL — AB (ref 70–99)
Glucose-Capillary: 137 mg/dL — ABNORMAL HIGH (ref 70–99)
Glucose-Capillary: 98 mg/dL (ref 70–99)

## 2018-03-22 LAB — CBC WITH DIFFERENTIAL/PLATELET
BASOS ABS: 0 10*3/uL (ref 0.0–0.1)
BASOS PCT: 0 %
EOS ABS: 0 10*3/uL (ref 0.0–0.7)
EOS PCT: 0 %
HCT: 32.4 % — ABNORMAL LOW (ref 36.0–46.0)
Hemoglobin: 11.1 g/dL — ABNORMAL LOW (ref 12.0–15.0)
LYMPHS ABS: 1.8 10*3/uL (ref 0.7–4.0)
Lymphocytes Relative: 17 %
MCH: 25.9 pg — AB (ref 26.0–34.0)
MCHC: 34.3 g/dL (ref 30.0–36.0)
MCV: 75.7 fL — ABNORMAL LOW (ref 78.0–100.0)
Monocytes Absolute: 0.5 10*3/uL (ref 0.1–1.0)
Monocytes Relative: 5 %
Neutro Abs: 8.4 10*3/uL — ABNORMAL HIGH (ref 1.7–7.7)
Neutrophils Relative %: 78 %
Platelets: 336 10*3/uL (ref 150–400)
RBC: 4.28 MIL/uL (ref 3.87–5.11)
RDW: 13.7 % (ref 11.5–15.5)
WBC: 10.7 10*3/uL — AB (ref 4.0–10.5)

## 2018-03-22 LAB — BASIC METABOLIC PANEL
ANION GAP: 11 (ref 5–15)
BUN: 10 mg/dL (ref 6–20)
CALCIUM: 8.6 mg/dL — AB (ref 8.9–10.3)
CO2: 20 mmol/L — ABNORMAL LOW (ref 22–32)
CREATININE: 1.15 mg/dL — AB (ref 0.44–1.00)
Chloride: 110 mmol/L (ref 98–111)
Glucose, Bld: 171 mg/dL — ABNORMAL HIGH (ref 70–99)
Potassium: 3.5 mmol/L (ref 3.5–5.1)
SODIUM: 141 mmol/L (ref 135–145)

## 2018-03-22 MED ORDER — FAMOTIDINE 20 MG PO TABS
20.0000 mg | ORAL_TABLET | Freq: Two times a day (BID) | ORAL | Status: DC
  Administered 2018-03-22 – 2018-03-23 (×2): 20 mg via ORAL
  Filled 2018-03-22 (×2): qty 1

## 2018-03-22 MED ORDER — LISINOPRIL 20 MG PO TABS
20.0000 mg | ORAL_TABLET | Freq: Every day | ORAL | Status: DC
  Administered 2018-03-22 – 2018-03-23 (×2): 20 mg via ORAL
  Filled 2018-03-22 (×2): qty 1

## 2018-03-22 MED ORDER — LORAZEPAM 1 MG PO TABS
1.0000 mg | ORAL_TABLET | Freq: Once | ORAL | Status: AC
  Administered 2018-03-22: 1 mg via ORAL
  Filled 2018-03-22: qty 1

## 2018-03-22 MED ORDER — METHOCARBAMOL 500 MG PO TABS
750.0000 mg | ORAL_TABLET | Freq: Once | ORAL | Status: AC
  Administered 2018-03-22: 750 mg via ORAL
  Filled 2018-03-22: qty 2

## 2018-03-22 NOTE — Progress Notes (Signed)
PROGRESS NOTE [facility]   Kimberly [name]   TTS:[id number] DOB: 03-20-1991  DOA: 03/19/2018 PCP: Foye Spurling, MD   Brief Narrative:  Kimberly [name] is a 27 year old female with history significant for type 1 diabetes mellitus.  Patient presented to the ED complaining of nausea, vomiting and abdominal pain.  Upon ED evaluation found to have ketones in the urine, mild elevated anion gap and elevated glucose.  Patient was admitted with working diagnosis of mild DKA.  Subjective: Patient seen and examined, doing much better today, denies nausea and vomiting. Tolerating clear liquid diet well.   Assessment & Plan: DKA type 1 diabetes mellitus - resolved  Anion gap has closed, she initially was treated with IV insulin which was transitioned to subcutaneous Lantus and SSI.  CBGs stable.  Continue Lantus. Will continue to monitor.  Abdominal pain with nausea and vomiting likely related to gastroparesis Continue Reglan IV may switch to PO in AM, cont Pepcid twice daily ok to switch to PO, continue Zofran as needed. Advance diet as tolerated. Hold IVF for now. Monitor electrolytes.  CT abdomen with no acute abnormalities.  Hypertensive urgency - improving  Patient on lisinopril prior to admission, this may be contributed to nausea and vomiting as well.  IVF may contributing as well. Increase Lisinopril to 20 mg. Continue metoprolol IV, will monitor and change in AM. Hopefully BP can be controlled with lisinopril only   DVT prophylaxis: SCD's  Code Status: Full Code  Family Communication: None at bedside Disposition Plan: Home in Am if continues to tolerate diet  Consultants:   None  Procedures:   None  Antimicrobials:  None   Objective: Vitals:   03/21/18 2156 03/21/18 2214 03/22/18 0441 03/22/18 1355  BP: (!) 177/108 116/79 (!) 161/100 (!) 160/108  Pulse: (!) 114 (!) 113 (!) 121 (!) 105  Resp:   18 18  Temp:   99.3 F (37.4 C)   TempSrc:   Oral   SpO2: 96%   98% 100%  Weight:      Height:        Intake/Output Summary (Last 24 hours) at 03/22/2018 1418 Last data filed at 03/22/2018 1205 Gross per 24 hour  Intake 600 ml  Output -  Net 600 ml   Filed Weights   03/19/18 1936 03/20/18 0019  Weight: 70.8 kg (156 lb) 72.9 kg (160 lb 11.5 oz)    Examination:  General: Pt is alert, awake, not in acute distress Cardiovascular: RRR, S1/S2 +, no rubs, no gallops Respiratory: CTA bilaterally, no wheezing, no rhonchi Abdominal: Soft, NT, ND, bowel sounds + Extremities: no edema, no cyanosis  Data Reviewed: I have personally reviewed following labs and imaging studies  CBC: Recent Labs  Lab 03/19/18 0914 03/19/18 2219 03/19/18 2226 03/22/18 0621  WBC 13.5* 14.5*  --  10.7*  NEUTROABS 11.0*  --   --  8.4*  HGB 11.4* 11.4* 10.5* 11.1*  HCT 33.2* 32.7* 31.0* 32.4*  MCV 76.0* 75.7*  --  75.7*  PLT 297 280  --  092   Basic Metabolic Panel: Recent Labs  Lab 03/20/18 1010 03/20/18 1302 03/20/18 1714 03/21/18 0331 03/22/18 0621  NA 141 139 136 141 141  K 3.7 3.6 4.3 3.9 3.5  CL 109 107 102 107 110  CO2 21* 19* 19* 20* 20*  GLUCOSE 119* 165* 348* 199* 171*  BUN 8 7 8 10 10   CREATININE 1.09* 0.94 0.97 1.15* 1.15*  CALCIUM 8.2* 8.4* 8.9 8.7* 8.6*  MG  --   --   --   --  2.0   GFR: Estimated Creatinine Clearance: 72.2 mL/min (A) (by C-G formula based on SCr of 1.15 mg/dL (H)). Liver Function Tests: Recent Labs  Lab 03/19/18 0914 03/20/18 1714  AST 20 22  ALT 13 16  ALKPHOS 132* 172*  BILITOT 0.4 1.0  PROT 7.0 7.9  ALBUMIN 3.0* 3.2*   Recent Labs  Lab 03/19/18 0914 03/20/18 1714  LIPASE 21 24   No results for input(s): AMMONIA in the last 168 hours. Coagulation Profile: No results for input(s): INR, PROTIME in the last 168 hours. Cardiac Enzymes: No results for input(s): CKTOTAL, CKMB, CKMBINDEX, TROPONINI in the last 168 hours. BNP (last 3 results) No results for input(s): PROBNP in the last 8760  hours. HbA1C: No results for input(s): HGBA1C in the last 72 hours. CBG: Recent Labs  Lab 03/21/18 1756 03/21/18 2136 03/21/18 2213 03/22/18 0743 03/22/18 1152  GLUCAP 128* 134* 137* 139* 87   Lipid Profile: No results for input(s): CHOL, HDL, LDLCALC, TRIG, CHOLHDL, LDLDIRECT in the last 72 hours. Thyroid Function Tests: No results for input(s): TSH, T4TOTAL, FREET4, T3FREE, THYROIDAB in the last 72 hours. Anemia Panel: No results for input(s): VITAMINB12, FOLATE, FERRITIN, TIBC, IRON, RETICCTPCT in the last 72 hours. Sepsis Labs: No results for input(s): PROCALCITON, LATICACIDVEN in the last 168 hours.  Recent Results (from the past 240 hour(s))  MRSA PCR Screening     Status: None   Collection Time: 03/20/18 12:09 AM  Result Value Ref Range Status   MRSA by PCR NEGATIVE NEGATIVE Final    Comment:        The GeneXpert MRSA Assay (FDA approved for NASAL specimens only), is one component of a comprehensive MRSA colonization surveillance program. It is not intended to diagnose MRSA infection nor to guide or monitor treatment for MRSA infections. Performed at [facility], [place] [address] [place], [place] [address]       Radiology Studies: No results found.    Scheduled Meds: . enoxaparin (LOVENOX) injection  40 mg Subcutaneous Daily  . famotidine  20 mg Oral BID  . insulin aspart  0-9 Units Subcutaneous TID WC  . insulin aspart  5 Units Subcutaneous Once  . insulin detemir  18 Units Subcutaneous QHS  . lisinopril  20 mg Oral Daily  . metoCLOPramide (REGLAN) injection  10 mg Intravenous Q6H  . metoprolol tartrate  5 mg Intravenous Q6H  . sucralfate  1 g Oral TID WC & HS   Continuous Infusions:    LOS: 3 days    Time spent: Total of 25 minutes spent with pt, greater than 50% of which was spent in discussion of  treatment, counseling and coordination of care   Chipper Oman, MD Pager: Text Page via [email or web address]   If 7PM-7AM, please  contact night-coverage [email or web address] 03/22/2018, 2:18 PM   Note - This record has been created using [employer]. Chart creation errors have been sought, but may not always have been located. Such creation errors do not reflect on the standard of medical care.

## 2018-03-23 DIAGNOSIS — K3184 Gastroparesis: Secondary | ICD-10-CM

## 2018-03-23 DIAGNOSIS — E1143 Type 2 diabetes mellitus with diabetic autonomic (poly)neuropathy: Secondary | ICD-10-CM

## 2018-03-23 LAB — BASIC METABOLIC PANEL
Anion gap: 11 (ref 5–15)
BUN: 10 mg/dL (ref 6–20)
CALCIUM: 9.1 mg/dL (ref 8.9–10.3)
CO2: 25 mmol/L (ref 22–32)
CREATININE: 1.12 mg/dL — AB (ref 0.44–1.00)
Chloride: 107 mmol/L (ref 98–111)
GFR calc non Af Amer: >60 mL/min (ref >60)
Glucose, Bld: 92 mg/dL (ref 70–99)
Potassium: 3.3 mmol/L — ABNORMAL LOW (ref 3.5–5.1)
Sodium: 143 mmol/L (ref 135–145)

## 2018-03-23 LAB — GLUCOSE, CAPILLARY
GLUCOSE-CAPILLARY: 101 mg/dL — AB (ref 70–99)
GLUCOSE-CAPILLARY: 122 mg/dL — AB (ref 70–99)
GLUCOSE-CAPILLARY: 71 mg/dL (ref 70–99)
GLUCOSE-CAPILLARY: 77 mg/dL (ref 70–99)
GLUCOSE-CAPILLARY: 95 mg/dL (ref 70–99)
Glucose-Capillary: 43 mg/dL — CL (ref 70–99)

## 2018-03-23 LAB — MAGNESIUM: Magnesium: 2 mg/dL (ref 1.7–2.4)

## 2018-03-23 MED ORDER — METOPROLOL TARTRATE 50 MG PO TABS: 50.0000 mg | ORAL_TABLET | Freq: Two times a day (BID) | ORAL | 0 refills | Status: AC

## 2018-03-23 MED ORDER — POTASSIUM CHLORIDE CRYS ER 20 MEQ PO TBCR
40.0000 meq | EXTENDED_RELEASE_TABLET | Freq: Once | ORAL | Status: AC
  Administered 2018-03-23: 40 meq via ORAL
  Filled 2018-03-23: qty 2

## 2018-03-23 MED ORDER — METOPROLOL TARTRATE 25 MG PO TABS
25.0000 mg | ORAL_TABLET | Freq: Two times a day (BID) | ORAL | Status: DC
  Administered 2018-03-23: 25 mg via ORAL
  Filled 2018-03-23: qty 1

## 2018-03-23 MED ORDER — FAMOTIDINE 20 MG PO TABS: 20.0000 mg | ORAL_TABLET | Freq: Two times a day (BID) | ORAL | 0 refills | Status: DC

## 2018-03-23 MED ORDER — INSULIN GLARGINE 100 UNIT/ML ~~LOC~~ SOLN: 15.0000 [IU] | Freq: Every day | SUBCUTANEOUS | 0 refills | Status: DC

## 2018-03-23 MED ORDER — METOCLOPRAMIDE HCL 10 MG PO TABS: 10.0000 mg | ORAL_TABLET | Freq: Three times a day (TID) | ORAL | Status: DC

## 2018-03-23 MED ORDER — LISINOPRIL 20 MG PO TABS: 20.0000 mg | ORAL_TABLET | Freq: Every day | ORAL | 0 refills | Status: DC

## 2018-03-23 MED ORDER — METOPROLOL TARTRATE 25 MG PO TABS
25.0000 mg | ORAL_TABLET | Freq: Once | ORAL | Status: AC
  Administered 2018-03-23: 25 mg via ORAL
  Filled 2018-03-23: qty 1

## 2018-03-23 MED ORDER — METOCLOPRAMIDE HCL 10 MG PO TABS: 10.0000 mg | ORAL_TABLET | Freq: Three times a day (TID) | ORAL | 0 refills | Status: DC

## 2018-03-23 NOTE — Discharge Summary (Signed)
Physician Discharge Summary  Kimberly [name]  OTR:[id number]  DOB: 1991-07-07  DOA: 03/19/2018 PCP: Foye Spurling, MD  Admit date: 03/19/2018 Discharge date: 03/23/2018  Admitted From: Home  Disposition: Home   Recommendations for Outpatient Follow-up:  1. Follow up with PCP in 1 week to monitor BP  2. Please obtain BMP/CBC in one week to monitor electrolytes and Hgb   Discharge Condition: Stable  CODE STATUS: Full code  Diet recommendation: Heart Healthy / Carb Modified  Brief/Interim Summary: For full details see H&P/Progress note, but in brief, Kimberly [name] is a a 27 year old female with history significant for type 1 diabetes mellitus.  Patient presented to the ED complaining of nausea, vomiting and abdominal pain.  Upon ED evaluation found to have ketones in the urine, mild elevated anion gap and elevated glucose.  Patient was admitted with working diagnosis of mild DKA, complicated with HTN urgency.  Subjective: Patient seen and examined, she report feeling better, had severe back pain last yesterday, related to muscle spasm that resolved with Robaxin. Had hypoglycemic event overnight that resolved with juice. Tolerating diet well. BP remains in the high side.   Discharge Diagnoses/Hospital Course:  DKA type 1 diabetes mellitus uncontrolled with hyperglycemia - resolved  Anion gap has closed, she initially was treated with IV insulin which was transitioned to subcutaneous Lantus and SSI, Lantus was increased to 18 units, however will discharge on 15 units qHS and advised to follow up with PCP. A1C 9.5  Gastroparesis Treated with Reglan IV every 6 hrs. Will discharge on reglan PO 10 mg TID ACHS, Pepcid BID. Advised on small frequent meals. Zofran PRN. CT abdomen with no acute findings   Hypertensive urgency - improved Patient on lisinopril prior to admission, this may be contributed to nausea and vomiting as well.  Lisinopril was increased to 20 mg. Patient was treated  with IV hydralazine and IV metoprolol, subsequently was started on metoprolol 50 mg BID given sinus tachycardia. Advised to follow up with PCP in 1 week to monitor BP.   Hypokalemia  Repleted with Kdur  Check BMP and Mag in 1 week   On the day of the discharge the patient's vitals were stable, and no other acute medical condition were reported by patient. the patient was felt safe to be discharge to home   Discharge Instructions  You were cared for by a hospitalist during your hospital stay. If you have any questions about your discharge medications or the care you received while you were in the hospital after you are discharged, you can call the unit and asked to speak with the hospitalist on call if the hospitalist that took care of you is not available. Once you are discharged, your primary care physician will handle any further medical issues. Please note that NO REFILLS for any discharge medications will be authorized once you are discharged, as it is imperative that you return to your primary care physician (or establish a relationship with a primary care physician if you do not have one) for your aftercare needs so that they can reassess your need for medications and monitor your lab values.  Discharge Instructions    Call MD for:  difficulty breathing, headache or visual disturbances   Complete by:  As directed    Call MD for:  difficulty breathing, headache or visual disturbances   Complete by:  As directed    Call MD for:  extreme fatigue   Complete by:  As directed  Call MD for:  extreme fatigue   Complete by:  As directed    Call MD for:  hives   Complete by:  As directed    Call MD for:  hives   Complete by:  As directed    Call MD for:  persistant dizziness or light-headedness   Complete by:  As directed    Call MD for:  persistant dizziness or light-headedness   Complete by:  As directed    Call MD for:  persistant nausea and vomiting   Complete by:  As directed     Call MD for:  persistant nausea and vomiting   Complete by:  As directed    Call MD for:  redness, tenderness, or signs of infection (pain, swelling, redness, odor or green/yellow discharge around incision site)   Complete by:  As directed    Call MD for:  redness, tenderness, or signs of infection (pain, swelling, redness, odor or green/yellow discharge around incision site)   Complete by:  As directed    Call MD for:  severe uncontrolled pain   Complete by:  As directed    Call MD for:  severe uncontrolled pain   Complete by:  As directed    Call MD for:  temperature >100.4   Complete by:  As directed    Call MD for:  temperature >100.4   Complete by:  As directed    Diet - low sodium heart healthy   Complete by:  As directed    Diet - low sodium heart healthy   Complete by:  As directed    Diet Carb Modified   Complete by:  As directed    Discharge instructions   Complete by:  As directed    Small frequent meals  Follow up with primary doctor in 1 week to monitor BP  Take medications as indicated   Increase activity slowly   Complete by:  As directed      Allergies as of 03/23/2018   No Known Allergies     Medication List    STOP taking these medications   NOVOLIN N [place]     TAKE these medications   BAYER CONTOUR NEXT TEST test strip Generic drug:  glucose blood USE AS DIRECTED TO TEST 6 TO 8 TIMES DAILY   CONTOUR NEXT EZ MONITOR w/Device Kit 1 each by Does not apply route 6 (six) times daily. Provide lancets for testing six times daily. Type I DM with diabetes on Insulin Pump  DX 648.03   famotidine 20 MG tablet Commonly known as:  PEPCID Take 1 tablet (20 mg total) by mouth 2 (two) times daily.   insulin glargine 100 UNIT/ML injection Commonly known as:  LANTUS Inject 0.15 mLs (15 Units total) into the skin at bedtime.   lisinopril 20 MG tablet Commonly known as:  PRINIVIL,ZESTRIL Take 1 tablet (20 mg total) by mouth daily. Start taking on:  03/24/2018 What  changed:    medication strength  how much to take   metoCLOPramide 10 MG tablet Commonly known as:  REGLAN Take 1 tablet (10 mg total) by mouth 4 (four) times daily -  before meals and at bedtime.   metoprolol tartrate 50 MG tablet Commonly known as:  LOPRESSOR Take 1 tablet (50 mg total) by mouth 2 (two) times daily.   NEXPLANON 68 MG Impl implant Generic drug:  etonogestrel Inject 1 each into the skin once.   NOVOLIN R IJ Inject as directed. Sliding Scale: 0-50  is 2 units, 50-100 is 4 units, etc.   ondansetron 4 MG tablet Commonly known as:  ZOFRAN Take 1 tablet (4 mg total) by mouth every 6 (six) hours.      Follow-up Information    Foye Spurling, MD. Schedule an appointment as soon as possible for a visit in 1 week(s).   Specialty:  Internal Medicine Why:  Hospital follow up, BP check up  Contact information: [address] [place] [address] [phone]          No Known Allergies  Consultations:  None    Procedures/Studies: Ct Abdomen Pelvis W Contrast  Result Date: 03/19/2018 CLINICAL DATA:  Left-sided abdominal pain radiating into groin. EXAM: CT ABDOMEN AND PELVIS WITH CONTRAST TECHNIQUE: Multidetector CT imaging of the abdomen and pelvis was performed using the standard protocol following bolus administration of intravenous contrast. CONTRAST:  17m ISOVUE-300 IOPAMIDOL (ISOVUE-300) INJECTION 61% COMPARISON:  03/18/2015 FINDINGS: Lower chest: No acute abnormality. Hepatobiliary: The liver is unremarkable. Stable small dependent calcifications in the gallbladder consistent with calculi. No biliary ductal dilatation. Pancreas: Stable pancreatic atrophy. No evidence of pancreatitis, pancreatic ductal dilatation or mass. Spleen: Normal in size without focal abnormality. Adrenals/Urinary Tract: Adrenal glands are unremarkable. Kidneys are normal, without renal calculi, focal lesion, or hydronephrosis. Bladder is unremarkable.  Stomach/Bowel: Bowel shows no evidence of obstruction, ileus, inflammation or lesion. No free air. No focal abscess. Vascular/Lymphatic: No significant vascular findings are present. No enlarged abdominal or pelvic lymph nodes. Reproductive: Uterus and bilateral adnexa are unremarkable. Other: No abdominal wall hernia or abnormality. No abdominopelvic ascites. Musculoskeletal: No acute or significant osseous findings. IMPRESSION: No acute findings in the abdomen or pelvis. Stable appearance of small gallstones. Electronically Signed   By: GAletta EdouardM.D.   On: 03/19/2018 11:33   Dg Chest Port 1 View  Result Date: 03/19/2018 CLINICAL DATA:  Short of breath EXAM: PORTABLE CHEST 1 VIEW COMPARISON:  03/18/2015 FINDINGS: No acute opacity or pleural effusion. Normal heart size. No pneumothorax. IMPRESSION: No active disease. Electronically Signed   By: KDonavan FoilM.D.   On: 03/19/2018 22:14    Discharge Exam: Vitals:   03/23/18 1100 03/23/18 1304  BP: (!) 147/101 133/85  Pulse:  90  Resp:  14  Temp:  98.5 F (36.9 C)  SpO2:  100%   Vitals:   03/23/18 0549 03/23/18 1016 03/23/18 1100 03/23/18 1304  BP: (!) 177/105 (!) 165/110 (!) 147/101 133/85  Pulse: (!) 106 93  90  Resp: (!) 22   14  Temp: 98.2 F (36.8 C)   98.5 F (36.9 C)  TempSrc:    Oral  SpO2: 100%   100%  Weight:      Height:        General: Pt is alert, awake, not in acute distress Cardiovascular: RRR, S1/S2 +, no rubs, no gallops Respiratory: CTA bilaterally, no wheezing, no rhonchi Abdominal: Soft, NT, ND, bowel sounds + Extremities: no edema, no cyanosis   The results of significant diagnostics from this hospitalization (including imaging, microbiology, ancillary and laboratory) are listed below for reference.     Microbiology: Recent Results (from the past 240 hour(s))  MRSA PCR Screening     Status: None   Collection Time: 03/20/18 12:09 AM  Result Value Ref Range Status   MRSA by PCR NEGATIVE NEGATIVE  Final    Comment:        The GeneXpert MRSA Assay (FDA approved for NASAL specimens only), is one component  of a comprehensive MRSA colonization surveillance program. It is not intended to diagnose MRSA infection nor to guide or monitor treatment for MRSA infections. Performed at [facility], [place] [address] [place], [place] [address]      Labs: BNP (last 3 results) No results for input(s): BNP in the last 8760 hours. Basic Metabolic Panel: Recent Labs  Lab 03/20/18 1302 03/20/18 1714 03/21/18 0331 03/22/18 0621 03/23/18 0541  NA 139 136 141 141 143  K 3.6 4.3 3.9 3.5 3.3*  CL 107 102 107 110 107  CO2 19* 19* 20* 20* 25  GLUCOSE 165* 348* 199* 171* 92  BUN 7 8 10 10 10   CREATININE 0.94 0.97 1.15* 1.15* 1.12*  CALCIUM 8.4* 8.9 8.7* 8.6* 9.1  MG  --   --   --  2.0 2.0   Liver Function Tests: Recent Labs  Lab 03/19/18 0914 03/20/18 1714  AST 20 22  ALT 13 16  ALKPHOS 132* 172*  BILITOT 0.4 1.0  PROT 7.0 7.9  ALBUMIN 3.0* 3.2*   Recent Labs  Lab 03/19/18 0914 03/20/18 1714  LIPASE 21 24   No results for input(s): AMMONIA in the last 168 hours. CBC: Recent Labs  Lab 03/19/18 0914 03/19/18 2219 03/19/18 2226 03/22/18 0621  WBC 13.5* 14.5*  --  10.7*  NEUTROABS 11.0*  --   --  8.4*  HGB 11.4* 11.4* 10.5* 11.1*  HCT 33.2* 32.7* 31.0* 32.4*  MCV 76.0* 75.7*  --  75.7*  PLT 297 280  --  336   Cardiac Enzymes: No results for input(s): CKTOTAL, CKMB, CKMBINDEX, TROPONINI in the last 168 hours. BNP: Invalid input(s): POCBNP CBG: Recent Labs  Lab 03/23/18 0419 03/23/18 0446 03/23/18 0530 03/23/18 0758 03/23/18 1200  GLUCAP 43* 71 77 101* 95   D-Dimer No results for input(s): DDIMER in the last 72 hours. Hgb A1c No results for input(s): HGBA1C in the last 72 hours. Lipid Profile No results for input(s): CHOL, HDL, LDLCALC, TRIG, CHOLHDL, LDLDIRECT in the last 72 hours. Thyroid function studies No results for input(s): TSH,  T4TOTAL, T3FREE, THYROIDAB in the last 72 hours.  Invalid input(s): FREET3 Anemia work up No results for input(s): VITAMINB12, FOLATE, FERRITIN, TIBC, IRON, RETICCTPCT in the last 72 hours. Urinalysis    Component Value Date/Time   COLORURINE YELLOW 03/19/2018 1942   APPEARANCEUR CLEAR 03/19/2018 1942   LABSPEC 1.022 03/19/2018 1942   PHURINE 6.0 03/19/2018 1942   GLUCOSEU >=500 (A) 03/19/2018 1942   HGBUR SMALL (A) 03/19/2018 1942   BILIRUBINUR NEGATIVE 03/19/2018 1942   KETONESUR 20 (A) 03/19/2018 1942   PROTEINUR >=300 (A) 03/19/2018 1942   UROBILINOGEN 0.2 03/21/2015 2010   NITRITE NEGATIVE 03/19/2018 1942   LEUKOCYTESUR NEGATIVE 03/19/2018 1942   Sepsis Labs Invalid input(s): PROCALCITONIN,  WBC,  LACTICIDVEN Microbiology Recent Results (from the past 240 hour(s))  MRSA PCR Screening     Status: None   Collection Time: 03/20/18 12:09 AM  Result Value Ref Range Status   MRSA by PCR NEGATIVE NEGATIVE Final    Comment:        The GeneXpert MRSA Assay (FDA approved for NASAL specimens only), is one component of a comprehensive MRSA colonization surveillance program. It is not intended to diagnose MRSA infection nor to guide or monitor treatment for MRSA infections. Performed at [facility], [place] [address], [place], [place] [address]      Time coordinating discharge: 35 minutes  SIGNED:  Chipper Oman, MD  [facility] 03/23/2018, 1:04 PM  Pager please text page via  [email or web address]  Note - This record has been created using [employer]. Chart creation errors have been sought, but may not always have been located. Such creation errors do not reflect on the standard of medical care.

## 2018-03-23 NOTE — Progress Notes (Signed)
CRITICAL VALUE ALERT  Critical Value:  CBG  Date & Time Notied: 03/23/18 0417  Provider Notified: MD Baltazar Najjar via Amion  Orders Received/Actions taken: no

## 2018-03-23 NOTE — Progress Notes (Signed)
Inpatient Diabetes Program Recommendations  AACE/ADA: New Consensus Statement on Inpatient Glycemic Control (2015)  Target Ranges:  Prepandial:   less than 140 mg/dL      Peak postprandial:   less than 180 mg/dL (1-2 hours)      Critically ill patients:  140 - 180 mg/dL   Results for LUNNA, VOGELGESANG (MRN [id number]) as of 03/23/2018 10:41  Ref. Range 03/22/2018 07:43 03/22/2018 11:52 03/22/2018 16:50 03/22/2018 20:45  Glucose-Capillary Latest Ref Range: 70 - 99 mg/dL 139 (H) 87 98 162 (H)   Results for ADDILYNNE, OLHEISER (MRN [id number]) as of 03/23/2018 10:41  Ref. Range 03/23/2018 04:19 03/23/2018 04:46 03/23/2018 05:30 03/23/2018 07:58  Glucose-Capillary Latest Ref Range: 70 - 99 mg/dL 43 (LL) 71 77 101 (H)     Home DM Meds: NPH Insulin- 22 units daily                             Regular Insulin- Per SSI  Current Insulin Orders: Levemir 18 units QHS                                       Novolog Sensitive Correction Scale/ SSI (0-9 units) TID AC + HS      MD- Note patient with Severe Hypoglycemia this AM.  CBG down to 43 mg/dl after receiving 18 units Levemir last PM.  Please consider reducing Levemir back to 15 units QHS     --Will follow patient during hospitalization--  Wyn Quaker RN, MSN, CDE Diabetes Coordinator Inpatient Glycemic Control Team Team Pager: [phone] (8a-5p)

## 2018-03-25 ENCOUNTER — Emergency Department (HOSPITAL_COMMUNITY)
Admission: EM | Admit: 2018-03-25 | Discharge: 2018-03-25 | Discharge status: Against Medical Advice | Payer: BLUE CROSS/BLUE SHIELD

## 2018-03-26 LAB — GLUCOSE, CAPILLARY: Glucose-Capillary: 43 mg/dL — CL (ref 70–99)

## 2018-05-21 ENCOUNTER — Encounter (HOSPITAL_COMMUNITY): Payer: Self-pay

## 2018-05-21 ENCOUNTER — Emergency Department (HOSPITAL_COMMUNITY)
Admission: EM | Admit: 2018-05-21 | Discharge: 2018-05-21 | Disposition: A | Discharge status: Home / Self Care | Payer: BLUE CROSS/BLUE SHIELD | Attending: Emergency Medicine | Admitting: Emergency Medicine

## 2018-05-21 ENCOUNTER — Other Ambulatory Visit: Payer: Self-pay

## 2018-05-21 DIAGNOSIS — Z79899 Other long term (current) drug therapy: Secondary | ICD-10-CM | POA: Diagnosis not present

## 2018-05-21 DIAGNOSIS — A598 Trichomoniasis of other sites: Secondary | ICD-10-CM | POA: Insufficient documentation

## 2018-05-21 DIAGNOSIS — D573 Sickle-cell trait: Secondary | ICD-10-CM | POA: Insufficient documentation

## 2018-05-21 DIAGNOSIS — E109 Type 1 diabetes mellitus without complications: Secondary | ICD-10-CM | POA: Diagnosis not present

## 2018-05-21 DIAGNOSIS — Z794 Long term (current) use of insulin: Secondary | ICD-10-CM | POA: Insufficient documentation

## 2018-05-21 DIAGNOSIS — R21 Rash and other nonspecific skin eruption: Secondary | ICD-10-CM

## 2018-05-21 DIAGNOSIS — A5909 Other urogenital trichomoniasis: Secondary | ICD-10-CM

## 2018-05-21 DIAGNOSIS — L03113 Cellulitis of right upper limb: Secondary | ICD-10-CM | POA: Diagnosis not present

## 2018-05-21 LAB — CBC WITH DIFFERENTIAL/PLATELET
ABS IMMATURE GRANULOCYTES: 0 10*3/uL (ref 0.0–0.1)
BASOS ABS: 0.1 10*3/uL (ref 0.0–0.1)
Basophils Relative: 1 %
Eosinophils Absolute: 0.2 10*3/uL (ref 0.0–0.7)
Eosinophils Relative: 2 %
HCT: 33.6 % — ABNORMAL LOW (ref 36.0–46.0)
HEMOGLOBIN: 11.3 g/dL — AB (ref 12.0–15.0)
IMMATURE GRANULOCYTES: 0 %
LYMPHS ABS: 2.1 10*3/uL (ref 0.7–4.0)
LYMPHS PCT: 23 %
MCH: 25.2 pg — ABNORMAL LOW (ref 26.0–34.0)
MCHC: 33.6 g/dL (ref 30.0–36.0)
MCV: 74.8 fL — ABNORMAL LOW (ref 78.0–100.0)
Monocytes Absolute: 0.6 10*3/uL (ref 0.1–1.0)
Monocytes Relative: 7 %
NEUTROS ABS: 6.1 10*3/uL (ref 1.7–7.7)
Neutrophils Relative %: 67 %
Platelets: 308 10*3/uL (ref 150–400)
RBC: 4.49 MIL/uL (ref 3.87–5.11)
RDW: 13 % (ref 11.5–15.5)
WBC: 9 10*3/uL (ref 4.0–10.5)

## 2018-05-21 LAB — URINALYSIS, ROUTINE W REFLEX MICROSCOPIC
BILIRUBIN URINE: NEGATIVE
HGB URINE DIPSTICK: NEGATIVE
KETONES UR: NEGATIVE mg/dL
NITRITE: NEGATIVE
PROTEIN: 100 mg/dL — AB
Specific Gravity, Urine: 1.015 (ref 1.005–1.030)
pH: 5 (ref 5.0–8.0)

## 2018-05-21 LAB — WET PREP, GENITAL
Clue Cells Wet Prep HPF POC: NONE SEEN
Sperm: NONE SEEN
Yeast Wet Prep HPF POC: NONE SEEN

## 2018-05-21 LAB — COMPREHENSIVE METABOLIC PANEL
ALBUMIN: 3.1 g/dL — AB (ref 3.5–5.0)
ALT: 12 U/L (ref 0–44)
ANION GAP: 13 (ref 5–15)
AST: 17 U/L (ref 15–41)
Alkaline Phosphatase: 113 U/L (ref 38–126)
BILIRUBIN TOTAL: 0.4 mg/dL (ref 0.3–1.2)
BUN: 16 mg/dL (ref 6–20)
CHLORIDE: 101 mmol/L (ref 98–111)
CO2: 19 mmol/L — ABNORMAL LOW (ref 22–32)
Calcium: 9.4 mg/dL (ref 8.9–10.3)
Creatinine, Ser: 1.27 mg/dL — ABNORMAL HIGH (ref 0.44–1.00)
GFR calc Af Amer: >60 mL/min (ref >60)
GFR, EST NON AFRICAN AMERICAN: 56 mL/min — AB (ref >60)
Glucose, Bld: 419 mg/dL — ABNORMAL HIGH (ref 70–99)
POTASSIUM: 5.2 mmol/L — AB (ref 3.5–5.1)
Sodium: 133 mmol/L — ABNORMAL LOW (ref 135–145)
TOTAL PROTEIN: 6.7 g/dL (ref 6.5–8.1)

## 2018-05-21 LAB — I-STAT CG4 LACTIC ACID, ED
LACTIC ACID, VENOUS: 2.33 mmol/L — AB (ref 0.5–1.9)
LACTIC ACID, VENOUS: 1.93 mmol/L — AB (ref 0.5–1.9)

## 2018-05-21 LAB — I-STAT BETA HCG BLOOD, ED (MC, WL, AP ONLY): I-stat hCG, quantitative: <5 m[IU]/mL (ref <5)

## 2018-05-21 MED ORDER — DOXYCYCLINE HYCLATE 100 MG PO CAPS: 100.0000 mg | ORAL_CAPSULE | Freq: Two times a day (BID) | ORAL | 0 refills | Status: AC

## 2018-05-21 MED ORDER — SODIUM CHLORIDE 0.9 % IV BOLUS
1000.0000 mL | Freq: Once | INTRAVENOUS | Status: DC
Start: 2018-05-21 — End: 2018-05-21

## 2018-05-21 MED ORDER — METRONIDAZOLE 500 MG PO TABS
2000.0000 mg | ORAL_TABLET | Freq: Once | ORAL | Status: AC
  Administered 2018-05-21: 2000 mg via ORAL
  Filled 2018-05-21: qty 4

## 2018-05-21 NOTE — ED Triage Notes (Signed)
Pt endorses blister to right forearm that began yesterday morning and is now 3 inches in diameter and has bubbling. Pain and warm to touch. Pt also endorses vaginal itching that began last week as well. Pt denies being burnt or injury in arm, had a Emily Forse recluse bite in same area 6 years ago.

## 2018-05-21 NOTE — ED Provider Notes (Signed)
[place] EMERGENCY DEPARTMENT Provider Note   CSN: [id number] Arrival date & time: 05/21/18  9476     History   Chief Complaint Chief Complaint  Patient presents with  . Blister  . Cellulitis    HPI ORIE CUTTINO is a 27 y.o. female with history of diabetes mellitus, sickle cell trait presents for evaluation of acute onset, progressively worsening rash to the right forearm for 3 days as well as 5 days of progressively worsening vaginal itching and discharge.  She notes clear-white discharge daily as well as itching and today noted dysuria.  She thought that she had a yeast infection as she is prone to them because she is diabetic and was given Diflucan 4 days ago without relief of her symptoms.  Denies abdominal pain, hematuria, urgency or frequency, diarrhea, constipation, nausea, vomiting.  No fevers or chills.  She states she is not currently sexually active.  With regards to the rash she noted an area of blistering to the volar aspect of the proximal right forearm 3 days ago.  Since then the blistering has gotten larger and spread and there has also been surrounding redness which is new.  She does note chronic hyperpigmentation of the area for the past 5 or 6 years secondary to a brown recluse bite.  This is unchanged.  She notes that the rash is itchy and tender to palpation.  She is applied hydrocortisone cream without relief.  Denies any new shampoos, lotions, soaps, or detergents.  Denies any recent insect bites.  She also notes a similar area of dry flaky skin to the left upper lip.  The history is provided by the patient.    Past Medical History:  Diagnosis Date  . Diabetes mellitus without complication ([place])   . Preeclampsia   . Preeclampsia without severe features   . Sickle cell trait [facility])     Patient Active Problem List   Diagnosis Date Noted  . CIN III (cervical intraepithelial neoplasia grade III) with severe dysplasia 09/09/2017  . DKA,  type 1 ([place]) 03/17/2015  . Diabetic nephropathy associated with type 1 diabetes mellitus ([place]) 03/17/2015  . Type 1 diabetes mellitus with diabetic nephropathy ([place]) 05/25/2013    Past Surgical History:  Procedure Laterality Date  . WISDOM TOOTH EXTRACTION       OB History    Gravida  1   Para  1   Term      Preterm  1   AB      Living  1     SAB      TAB      Ectopic      Multiple      Live Births  1            Home Medications    Prior to Admission medications   Medication Sig Start Date End Date Taking? Authorizing Provider  BAYER CONTOUR NEXT TEST test strip USE AS DIRECTED TO TEST 6 TO 8 TIMES DAILY 10/01/14   Woodroe Mode, MD  Blood Glucose Monitoring Suppl (CONTOUR NEXT EZ MONITOR) W/DEVICE KIT 1 each by Does not apply route 6 (six) times daily. Provide lancets for testing six times daily. Type I DM with diabetes on Insulin Pump  DX 648.03 06/17/13   Woodroe Mode, MD  doxycycline (VIBRAMYCIN) 100 MG capsule Take 1 capsule (100 mg total) by mouth 2 (two) times daily for 7 days. 05/21/18 05/28/18  Rodell Perna A, PA-C  etonogestrel (NEXPLANON)  68 MG IMPL implant Inject 1 each into the skin once.    [provider]  famotidine (PEPCID) 20 MG tablet Take 1 tablet (20 mg total) by mouth 2 (two) times daily. 03/23/18   Doreatha Lew, MD  insulin glargine (LANTUS) 100 UNIT/ML injection Inject 0.15 mLs (15 Units total) into the skin at bedtime. 03/23/18   Doreatha Lew, MD  Insulin Regular Human (NOVOLIN R IJ) Inject as directed. Sliding Scale: 0-50 is 2 units, 50-100 is 4 units, etc.    [provider]  lisinopril (PRINIVIL,ZESTRIL) 20 MG tablet Take 1 tablet (20 mg total) by mouth daily. 03/24/18   Doreatha Lew, MD  metoCLOPramide (REGLAN) 10 MG tablet Take 1 tablet (10 mg total) by mouth 4 (four) times daily -  before meals and at bedtime. 03/23/18   Doreatha Lew, MD  metoprolol tartrate (LOPRESSOR) 50 MG tablet Take 1  tablet (50 mg total) by mouth 2 (two) times daily. 03/23/18   Doreatha Lew, MD  ondansetron (ZOFRAN) 4 MG tablet Take 1 tablet (4 mg total) by mouth every 6 (six) hours. 03/19/18   Hayden Rasmussen, MD    Family History Family History  Problem Relation Age of Onset  . Lupus Mother   . Aneurysm Mother     Social History Social History   Tobacco Use  . Smoking status: Never Smoker  . Smokeless tobacco: Never Used  Substance Use Topics  . Alcohol use: No  . Drug use: No     Allergies   Patient has no known allergies.   Review of Systems Review of Systems  Constitutional: Negative for chills and fever.  Respiratory: Negative for shortness of breath.   Cardiovascular: Negative for chest pain.  Gastrointestinal: Negative for abdominal pain, nausea and vomiting.  Genitourinary: Positive for dysuria and vaginal discharge. Negative for frequency, hematuria, urgency, vaginal bleeding and vaginal pain.  Skin: Positive for color change and rash.  All other systems reviewed and are negative.    Physical Exam Updated Vital Signs BP (!) 120/93 (BP Location: Left Arm)   Pulse 98   Temp 98.3 F (36.8 C) (Oral)   Resp 16   Ht 5' 5"  (1.651 m)   Wt 73.9 kg   LMP 05/11/2018 (Exact Date)   SpO2 100%   BMI 27.12 kg/m   Physical Exam  Constitutional: She appears well-developed and well-nourished. No distress.  HENT:  Head: Normocephalic and atraumatic.  Eyes: Conjunctivae are normal. Right eye exhibits no discharge. Left eye exhibits no discharge.  Neck: No JVD present. No tracheal deviation present.  Cardiovascular: Normal rate.  Pulmonary/Chest: Effort normal.  Abdominal: Soft. Bowel sounds are normal. She exhibits no distension. There is no tenderness. There is no guarding.  Genitourinary:  Genitourinary Comments: Examination performed in the presence of a chaperone.  No masses or lesions to the external genitalia.  Moderate amount of yellow frothy discharge in the  vaginal vault.  Cervix appears friable.  No cervical motion tenderness or adnexal tenderness.  Uterus is soft and nontender  Musculoskeletal: She exhibits no edema.  Neurological: She is alert.  Skin: Skin is warm and dry. There is erythema.  See below image.  There is a blistering rash to the volar aspect of the right forearm.  There is surrounding erythema.  Hyperpigmented lesion is chronic and unchanged per the patient.  Nikolsky sign absent.  Tender to palpation. 1cm patch of dry flaking skin noted near the right upper lip. No  vesicles. Nontender to palpation.   Psychiatric: She has a normal mood and affect. Her behavior is normal.  Nursing note and vitals reviewed.      ED Treatments / Results  Labs (all labs ordered are listed, but only abnormal results are displayed) Labs Reviewed  WET PREP, GENITAL - Abnormal; Notable for the following components:      Result Value   Trich, Wet Prep PRESENT (*)    WBC, Wet Prep HPF POC MANY (*)    All other components within normal limits  COMPREHENSIVE METABOLIC PANEL - Abnormal; Notable for the following components:   Sodium 133 (*)    Potassium 5.2 (*)    CO2 19 (*)    Glucose, Bld 419 (*)    Creatinine, Ser 1.27 (*)    Albumin 3.1 (*)    GFR calc non Af Amer 56 (*)    All other components within normal limits  CBC WITH DIFFERENTIAL/PLATELET - Abnormal; Notable for the following components:   Hemoglobin 11.3 (*)    HCT 33.6 (*)    MCV 74.8 (*)    MCH 25.2 (*)    All other components within normal limits  URINALYSIS, ROUTINE W REFLEX MICROSCOPIC - Abnormal; Notable for the following components:   APPearance CLOUDY (*)    Glucose, UA >=500 (*)    Protein, ur 100 (*)    Leukocytes, UA LARGE (*)    WBC, UA >50 (*)    Bacteria, UA MANY (*)    Trichomonas, UA PRESENT (*)    All other components within normal limits  I-STAT CG4 LACTIC ACID, ED - Abnormal; Notable for the following components:   Lactic Acid, Venous 2.33 (*)    All  other components within normal limits  I-STAT CG4 LACTIC ACID, ED - Abnormal; Notable for the following components:   Lactic Acid, Venous 1.93 (*)    All other components within normal limits  HIV ANTIBODY (ROUTINE TESTING W REFLEX)  RPR  I-STAT BETA HCG BLOOD, ED ([facility], [facility], [facility] ONLY)  GC/CHLAMYDIA PROBE AMP ([place]) NOT AT [facility]    EKG None  Radiology No results found.  Procedures Procedures (including critical care time)  Medications Ordered in ED Medications  metroNIDAZOLE (FLAGYL) tablet 2,000 mg (2,000 mg Oral Given 05/21/18 1258)     Initial Impression / Assessment and Plan / ED Course  I have reviewed the triage vital signs and the nursing notes.  Pertinent labs & imaging results that were available during my care of the patient were reviewed by me and considered in my medical decision making (see chart for details).     Patient with abnormal vaginal discharge for 5 days as well as acute onset of rash to the right forearm for 3 days.  She is afebrile, vital signs are stable.  She is nontoxic in appearance. Patient denies any difficulty breathing or swallowing.  Pt has a patent airway without stridor and is handling secretions without difficulty; no angioedema. No pustules, no warmth, no draining sinus tracts, no superficial abscesses, no bullous impetigo, no vesicles, no desquamation. Doubt SJS, TEN, TSS, tick borne illness, syphilis, necrotizing fasciitis, or other life-threatening condition. Rash suggestive of possible irritation secondary to insect bite. Some concern for superimposed infection given tenderness and surrounding erythema. Recommend topical steroids, benadryl and pepcid as needed for itching, and course of doxycycline for possible cellulitis.  Area of erythema marked with skin pen.  Dressing applied.  On exam she has no cervical motion tenderness or  adnexal tenderness.  Abdomen is soft and nontender.  Low suspicion of obstruction, perforation, appendicitis,  colitis, PID, ectopic pregnancy, ovarian torsion, or other acute surgical abdominal pathology in the absence of tenderness.  Lab work reviewed by me shows no leukocytosis, mild anemia.  She is mildly hyponatremic and mildly hyperkalemic with mildly elevated creatinine of 1.27.  She is quite hyperglycemic but no evidence of DKA.  She states that her blood sugars run between 300-400.  She declines IV and states that she will drink 20 of water at home.  She has followed up with her PCP recently with some medication changes and she is scheduled to follow-up with her PCP in December for further evaluation of her diabetes.  She had a mildly elevated lactate which improved on reevaluation.  Does not appear to be septic at this time and with stable vital signs I do not feel the need to aggressively fluid rehydrate and otherwise healthy appearing patient.  Wet prep significant for trichomonas.  UA appears suggestive of UTI however suspect this is contaminated from vaginal discharge.  2 g Flagyl given in the ED.  Recommend follow-up with PCP or OB/GYN for further evaluation.  Instructed patient to call her prior sexual partners and tell them to go to the health department for further testing or treatment.  Patient understands that remaining cultures are pending and she will be called if there are any abnormal results.  Discussed strict ED return precautions. Pt verbalized understanding of and agreement with plan and is safe for discharge home at this time.  Patient seen and evaluated by Dr. Vanita Panda who agrees with assessment and plan at this time.  Final Clinical Impressions(s) / ED Diagnoses   Final diagnoses:  Cellulitis of right upper extremity  Rash  Trichomonal cervicitis    ED Discharge Orders         Ordered    doxycycline (VIBRAMYCIN) 100 MG capsule  2 times daily     05/21/18 [address] [place] A, PA-C 05/21/18 1628    Carmin Muskrat, MD 05/24/18 2338

## 2018-05-21 NOTE — Discharge Instructions (Signed)
Please take all of your antibiotics until finished!   You may develop abdominal discomfort or diarrhea from the antibiotic.  You may help offset this with probiotics which you can buy or get in yogurt. Do not eat  or take the probiotics until 2 hours after your antibiotic.   Follow up with [facility] to be screened for HIV in the future and for future STD concerns or screenings.  Call your prior sexual partners and tell them to go to the health department for further testing and treatment of trichomonas. The hospital will call you if remaining labwork is positive.  Do not have sexual intercourse for 7 days after antibiotic treatment.   Return to the emergency department immediately for any concerning signs or symptoms develop such as worsening spread of redness, streaking of redness up the arm, worsening rash, fevers.  You can follow-up with your primary care physician or dermatology for further evaluation.

## 2018-05-21 NOTE — ED Notes (Signed)
Patient states she woke up Sunday am with itching to her right forearm,, states she had a small blister, today large blister with several small ones around it.

## 2018-05-21 NOTE — Progress Notes (Signed)
VAST to assess pt. Right arm with large blister, redness, and swelling. Very small veins noted in left arm.  Pt states she has extremely small veins and the [place] is usually used.

## 2018-05-22 LAB — GC/CHLAMYDIA PROBE AMP (~~LOC~~) NOT AT ARMC
Chlamydia: NEGATIVE
Neisseria Gonorrhea: NEGATIVE

## 2018-08-29 ENCOUNTER — Other Ambulatory Visit: Payer: Self-pay

## 2018-08-29 ENCOUNTER — Emergency Department (HOSPITAL_COMMUNITY): Payer: BLUE CROSS/BLUE SHIELD

## 2018-08-29 ENCOUNTER — Inpatient Hospital Stay (HOSPITAL_COMMUNITY)
Admission: EM | Admit: 2018-08-29 | Discharge: 2018-09-04 | DRG: 638 | Disposition: A | Discharge status: Home / Self Care | Payer: BLUE CROSS/BLUE SHIELD | Attending: Internal Medicine | Admitting: Internal Medicine

## 2018-08-29 ENCOUNTER — Encounter (HOSPITAL_COMMUNITY): Payer: Self-pay | Admitting: Family Medicine

## 2018-08-29 DIAGNOSIS — E1043 Type 1 diabetes mellitus with diabetic autonomic (poly)neuropathy: Secondary | ICD-10-CM | POA: Diagnosis present

## 2018-08-29 DIAGNOSIS — I160 Hypertensive urgency: Secondary | ICD-10-CM | POA: Diagnosis present

## 2018-08-29 DIAGNOSIS — E1010 Type 1 diabetes mellitus with ketoacidosis without coma: Secondary | ICD-10-CM | POA: Diagnosis not present

## 2018-08-29 DIAGNOSIS — R112 Nausea with vomiting, unspecified: Secondary | ICD-10-CM | POA: Diagnosis present

## 2018-08-29 DIAGNOSIS — N179 Acute kidney failure, unspecified: Secondary | ICD-10-CM | POA: Diagnosis present

## 2018-08-29 DIAGNOSIS — Z79899 Other long term (current) drug therapy: Secondary | ICD-10-CM

## 2018-08-29 DIAGNOSIS — E860 Dehydration: Secondary | ICD-10-CM

## 2018-08-29 DIAGNOSIS — E861 Hypovolemia: Secondary | ICD-10-CM | POA: Diagnosis present

## 2018-08-29 DIAGNOSIS — E1021 Type 1 diabetes mellitus with diabetic nephropathy: Secondary | ICD-10-CM | POA: Diagnosis present

## 2018-08-29 DIAGNOSIS — D509 Iron deficiency anemia, unspecified: Secondary | ICD-10-CM | POA: Diagnosis not present

## 2018-08-29 DIAGNOSIS — D573 Sickle-cell trait: Secondary | ICD-10-CM | POA: Diagnosis present

## 2018-08-29 DIAGNOSIS — K3184 Gastroparesis: Secondary | ICD-10-CM | POA: Diagnosis present

## 2018-08-29 DIAGNOSIS — D539 Nutritional anemia, unspecified: Secondary | ICD-10-CM | POA: Diagnosis present

## 2018-08-29 DIAGNOSIS — K30 Functional dyspepsia: Secondary | ICD-10-CM | POA: Diagnosis present

## 2018-08-29 DIAGNOSIS — I10 Essential (primary) hypertension: Secondary | ICD-10-CM | POA: Diagnosis present

## 2018-08-29 DIAGNOSIS — R002 Palpitations: Secondary | ICD-10-CM | POA: Diagnosis not present

## 2018-08-29 DIAGNOSIS — K5900 Constipation, unspecified: Secondary | ICD-10-CM | POA: Diagnosis present

## 2018-08-29 DIAGNOSIS — Z794 Long term (current) use of insulin: Secondary | ICD-10-CM

## 2018-08-29 HISTORY — PX: ESOPHAGOGASTRODUODENOSCOPY: SHX1529

## 2018-08-29 LAB — BLOOD GAS, VENOUS
ACID-BASE DEFICIT: 4.9 mmol/L — AB (ref 0.0–2.0)
Bicarbonate: 18.3 mmol/L — ABNORMAL LOW (ref 20.0–28.0)
O2 SAT: 79.9 %
PCO2 VEN: 29.9 mmHg — AB (ref 44.0–60.0)
PO2 VEN: 45.8 mmHg — AB (ref 32.0–45.0)
Patient temperature: 98.6
pH, Ven: 7.404 (ref 7.250–7.430)

## 2018-08-29 LAB — COMPREHENSIVE METABOLIC PANEL
ALBUMIN: 3.9 g/dL (ref 3.5–5.0)
ALT: 10 U/L (ref 0–44)
AST: 15 U/L (ref 15–41)
Alkaline Phosphatase: 59 U/L (ref 38–126)
Anion gap: 14 (ref 5–15)
BUN: 14 mg/dL (ref 6–20)
CHLORIDE: 105 mmol/L (ref 98–111)
CO2: 17 mmol/L — AB (ref 22–32)
Calcium: 9.2 mg/dL (ref 8.9–10.3)
Creatinine, Ser: 1.04 mg/dL — ABNORMAL HIGH (ref 0.44–1.00)
GFR calc Af Amer: >60 mL/min (ref >60)
GLUCOSE: 368 mg/dL — AB (ref 70–99)
POTASSIUM: 4 mmol/L (ref 3.5–5.1)
SODIUM: 136 mmol/L (ref 135–145)
Total Bilirubin: 0.7 mg/dL (ref 0.3–1.2)
Total Protein: 7.2 g/dL (ref 6.5–8.1)

## 2018-08-29 LAB — URINALYSIS, ROUTINE W REFLEX MICROSCOPIC
BILIRUBIN URINE: NEGATIVE
Glucose, UA: >=500 mg/dL — AB
Ketones, ur: 20 mg/dL — AB
LEUKOCYTES UA: NEGATIVE
Nitrite: NEGATIVE
PH: 6 (ref 5.0–8.0)
Protein, ur: >=300 mg/dL — AB
SPECIFIC GRAVITY, URINE: 1.014 (ref 1.005–1.030)

## 2018-08-29 LAB — CBC WITH DIFFERENTIAL/PLATELET
ABS IMMATURE GRANULOCYTES: 0.04 10*3/uL (ref 0.00–0.07)
Basophils Absolute: 0 10*3/uL (ref 0.0–0.1)
Basophils Relative: 0 %
Eosinophils Absolute: 0 10*3/uL (ref 0.0–0.5)
Eosinophils Relative: 0 %
HCT: 34.2 % — ABNORMAL LOW (ref 36.0–46.0)
Hemoglobin: 11.5 g/dL — ABNORMAL LOW (ref 12.0–15.0)
IMMATURE GRANULOCYTES: 0 %
LYMPHS PCT: 6 %
Lymphs Abs: 0.8 10*3/uL (ref 0.7–4.0)
MCH: 25.2 pg — ABNORMAL LOW (ref 26.0–34.0)
MCHC: 33.6 g/dL (ref 30.0–36.0)
MCV: 75 fL — ABNORMAL LOW (ref 80.0–100.0)
MONOS PCT: 1 %
Monocytes Absolute: 0.2 10*3/uL (ref 0.1–1.0)
NEUTROS ABS: 11.7 10*3/uL — AB (ref 1.7–7.7)
NEUTROS PCT: 93 %
Platelets: 281 10*3/uL (ref 150–400)
RBC: 4.56 MIL/uL (ref 3.87–5.11)
RDW: 13.3 % (ref 11.5–15.5)
WBC: 12.8 10*3/uL — ABNORMAL HIGH (ref 4.0–10.5)
nRBC: 0 % (ref 0.0–0.2)

## 2018-08-29 LAB — GLUCOSE, CAPILLARY
Glucose-Capillary: 203 mg/dL — ABNORMAL HIGH (ref 70–99)
Glucose-Capillary: 247 mg/dL — ABNORMAL HIGH (ref 70–99)

## 2018-08-29 LAB — LIPASE, BLOOD: LIPASE: 25 U/L (ref 11–51)

## 2018-08-29 LAB — I-STAT BETA HCG BLOOD, ED (MC, WL, AP ONLY): I-stat hCG, quantitative: <5 m[IU]/mL (ref <5)

## 2018-08-29 LAB — CBG MONITORING, ED: Glucose-Capillary: 271 mg/dL — ABNORMAL HIGH (ref 70–99)

## 2018-08-29 MED ORDER — MORPHINE SULFATE (PF) 4 MG/ML IV SOLN
4.0000 mg | Freq: Once | INTRAVENOUS | Status: AC
  Administered 2018-08-29: 4 mg via INTRAVENOUS
  Filled 2018-08-29: qty 1

## 2018-08-29 MED ORDER — ONDANSETRON HCL 4 MG PO TABS: 4.0000 mg | ORAL_TABLET | Freq: Four times a day (QID) | ORAL | Status: DC | PRN

## 2018-08-29 MED ORDER — ONDANSETRON HCL 4 MG/2ML IJ SOLN
4.0000 mg | Freq: Four times a day (QID) | INTRAMUSCULAR | Status: DC | PRN
  Administered 2018-08-30 – 2018-08-31 (×3): 4 mg via INTRAVENOUS
  Filled 2018-08-29 (×3): qty 2

## 2018-08-29 MED ORDER — ACETAMINOPHEN 650 MG RE SUPP: 650.0000 mg | Freq: Four times a day (QID) | RECTAL | Status: DC | PRN

## 2018-08-29 MED ORDER — IBUPROFEN 200 MG PO TABS
400.0000 mg | ORAL_TABLET | Freq: Four times a day (QID) | ORAL | Status: DC | PRN
  Filled 2018-08-29: qty 2

## 2018-08-29 MED ORDER — METOPROLOL TARTRATE 50 MG PO TABS
50.0000 mg | ORAL_TABLET | Freq: Two times a day (BID) | ORAL | Status: DC
  Administered 2018-08-29 – 2018-09-04 (×11): 50 mg via ORAL
  Filled 2018-08-29: qty 1
  Filled 2018-08-29: qty 2
  Filled 2018-08-29 (×3): qty 1
  Filled 2018-08-29 (×2): qty 2
  Filled 2018-08-29 (×3): qty 1
  Filled 2018-08-29: qty 2
  Filled 2018-08-29: qty 1

## 2018-08-29 MED ORDER — ACETAMINOPHEN 325 MG PO TABS
650.0000 mg | ORAL_TABLET | Freq: Four times a day (QID) | ORAL | Status: DC | PRN
  Administered 2018-08-30: 650 mg via ORAL
  Filled 2018-08-29: qty 2

## 2018-08-29 MED ORDER — PROMETHAZINE HCL 25 MG RE SUPP: 25.0000 mg | Freq: Four times a day (QID) | RECTAL | Status: DC | PRN

## 2018-08-29 MED ORDER — PROMETHAZINE HCL 25 MG/ML IJ SOLN
12.5000 mg | Freq: Four times a day (QID) | INTRAMUSCULAR | Status: DC | PRN
  Administered 2018-08-30 – 2018-09-01 (×3): 12.5 mg via INTRAVENOUS
  Filled 2018-08-29 (×3): qty 1

## 2018-08-29 MED ORDER — SODIUM CHLORIDE 0.9 % IV BOLUS
1000.0000 mL | Freq: Once | INTRAVENOUS | Status: AC
  Administered 2018-08-29: 1000 mL via INTRAVENOUS

## 2018-08-29 MED ORDER — ONDANSETRON HCL 4 MG/2ML IJ SOLN
4.0000 mg | Freq: Once | INTRAMUSCULAR | Status: AC
  Administered 2018-08-29: 4 mg via INTRAVENOUS
  Filled 2018-08-29: qty 2

## 2018-08-29 MED ORDER — SODIUM CHLORIDE 0.9 % IV SOLN
INTRAVENOUS | Status: DC
  Administered 2018-08-29 – 2018-08-30 (×3): via INTRAVENOUS

## 2018-08-29 MED ORDER — INSULIN REGULAR(HUMAN) IN NACL 100-0.9 UT/100ML-% IV SOLN
INTRAVENOUS | Status: DC
  Filled 2018-08-29: qty 100

## 2018-08-29 MED ORDER — INSULIN ASPART 100 UNIT/ML ~~LOC~~ SOLN
0.0000 [IU] | SUBCUTANEOUS | Status: DC
  Administered 2018-08-29: 3 [IU] via SUBCUTANEOUS
  Administered 2018-08-29: 2 [IU] via SUBCUTANEOUS
  Administered 2018-08-30: 3 [IU] via SUBCUTANEOUS

## 2018-08-29 MED ORDER — METOCLOPRAMIDE HCL 5 MG/ML IJ SOLN
10.0000 mg | Freq: Three times a day (TID) | INTRAMUSCULAR | Status: DC
  Administered 2018-08-29 (×2): 10 mg via INTRAVENOUS
  Filled 2018-08-29 (×2): qty 2

## 2018-08-29 MED ORDER — ENOXAPARIN SODIUM 40 MG/0.4ML ~~LOC~~ SOLN
40.0000 mg | SUBCUTANEOUS | Status: DC
  Administered 2018-08-29 – 2018-09-03 (×6): 40 mg via SUBCUTANEOUS
  Filled 2018-08-29 (×6): qty 0.4

## 2018-08-29 MED ORDER — FAMOTIDINE 20 MG PO TABS: 10.0000 mg | ORAL_TABLET | Freq: Two times a day (BID) | ORAL | Status: DC | PRN

## 2018-08-29 MED ORDER — SENNOSIDES-DOCUSATE SODIUM 8.6-50 MG PO TABS
1.0000 | ORAL_TABLET | Freq: Every evening | ORAL | Status: DC | PRN
  Administered 2018-08-30 – 2018-08-31 (×2): 1 via ORAL
  Filled 2018-08-29 (×2): qty 1

## 2018-08-29 MED ORDER — PROMETHAZINE HCL 25 MG PO TABS: 25.0000 mg | ORAL_TABLET | Freq: Four times a day (QID) | ORAL | Status: DC | PRN

## 2018-08-29 MED ORDER — INSULIN REGULAR BOLUS VIA INFUSION
0.0000 [IU] | Freq: Three times a day (TID) | INTRAVENOUS | Status: DC
  Filled 2018-08-29: qty 10

## 2018-08-29 MED ORDER — INSULIN ASPART 100 UNIT/ML ~~LOC~~ SOLN
8.0000 [IU] | Freq: Once | SUBCUTANEOUS | Status: AC
  Administered 2018-08-29: 8 [IU] via SUBCUTANEOUS
  Filled 2018-08-29: qty 1

## 2018-08-29 MED ORDER — LABETALOL HCL 100 MG PO TABS
100.0000 mg | ORAL_TABLET | Freq: Once | ORAL | Status: DC
  Filled 2018-08-29: qty 1

## 2018-08-29 MED ORDER — DEXTROSE 50 % IV SOLN: 25.0000 mL | INTRAVENOUS | Status: DC | PRN

## 2018-08-29 MED ORDER — HYDRALAZINE HCL 20 MG/ML IJ SOLN
5.0000 mg | Freq: Four times a day (QID) | INTRAMUSCULAR | Status: DC | PRN
  Administered 2018-08-30 – 2018-08-31 (×3): 5 mg via INTRAVENOUS
  Filled 2018-08-29 (×3): qty 1

## 2018-08-29 MED ORDER — ONDANSETRON HCL 4 MG/2ML IJ SOLN: 4.0000 mg | Freq: Once | INTRAMUSCULAR | Status: DC

## 2018-08-29 MED ORDER — SODIUM CHLORIDE 0.9 % IV SOLN: INTRAVENOUS | Status: DC

## 2018-08-29 MED ORDER — LISINOPRIL 20 MG PO TABS
20.0000 mg | ORAL_TABLET | Freq: Every day | ORAL | Status: DC
  Administered 2018-08-29 – 2018-08-31 (×3): 20 mg via ORAL
  Filled 2018-08-29 (×3): qty 1

## 2018-08-29 MED ORDER — DEXTROSE 50 % IV SOLN: 12.5000 g | INTRAVENOUS | Status: DC

## 2018-08-29 MED ORDER — LABETALOL HCL 5 MG/ML IV SOLN: 10.0000 mg | Freq: Once | INTRAVENOUS | Status: DC

## 2018-08-29 NOTE — ED Notes (Signed)
PT provided diet ginger ale, encouraged to drink

## 2018-08-29 NOTE — H&P (Signed)
History and Physical  Patient Name: Kimberly [name]     CBU:[id number]    DOB: [birth year]    DOA: 08/29/2018 PCP: Foye Spurling, MD  Patient coming from: Home  Chief Complaint: Vomiting, hyperglycemia      HPI: Kimberly [name] is a 28 y.o. HTN and T1DM on closed-loop insulin pump who presents with vomiting for 1 day.  The patient was in her usual state of health until yesterday when she felt "off" and "tired".  Then today, she woke with nausea, vomiting, and epigastric to generalized abdominal pain, similar to previous episodes of intractable vomiting and also DKA, so she came to the ER.  Had no recent fever, cough, sputum production.  She is had no recent chest pain, dyspnea.  She is had no recent dysuria, hematuria, urinary urgency.  ED course: - Heart rate 110, afebrile, respirations and pulse ox normal, blood pressure 198/106 -Na 136, K 4.0, Cr 1.0, WBC 12.8K, Hgb 11.5 -Glucose 270 initially, increased to 368 while in ER, anion gap normal, pH normal, bicarb 17 -lipase WNL -Pregnancy test normal -X-ray chest no pneumonia, x-ray abdomen only constipation -Given Morphine, IV fluids, ondansetron but could not tolerate anything PO and continued to vomit, so hospitalist group asked to evaluate for intractable vomiting and hyperglycemia, likely gastroparesis       ROS: Review of Systems  Constitutional: Positive for malaise/fatigue. Negative for chills, fever and weight loss.  Respiratory: Negative for cough, hemoptysis, sputum production, shortness of breath and wheezing.   Cardiovascular: Negative for chest pain, palpitations, orthopnea, claudication and leg swelling.  Gastrointestinal: Positive for abdominal pain, nausea and vomiting. Negative for blood in stool, constipation, diarrhea and melena.  Genitourinary: Negative for dysuria, flank pain, frequency, hematuria and urgency.  Musculoskeletal: Positive for joint pain (bilateral knee, ankle, chronic worst in AM, but only  for 10-15 min). Negative for myalgias.  All other systems reviewed and are negative.         Past Medical History:  Diagnosis Date  . Diabetes mellitus without complication ([place])   . Preeclampsia   . Preeclampsia without severe features   . Sickle cell trait [facility])     Past Surgical History:  Procedure Laterality Date  . WISDOM TOOTH EXTRACTION      Social History: Patient lives with her son.  The patient walks unassisted.  She works full time.  Nonsmoker.  No Known Allergies  Family history: family history includes Aneurysm in her mother; Lupus in her mother.  Prior to Admission medications   Medication Sig Start Date End Date Taking? Authorizing Provider  etonogestrel (NEXPLANON) 68 MG IMPL implant Inject 1 each into the skin once.   Yes [provider]  famotidine (PEPCID) 20 MG tablet Take 1 tablet (20 mg total) by mouth 2 (two) times daily. 03/23/18  Yes Patrecia Pour, Christean Grief, MD  Insulin Human (INSULIN PUMP) SOLN Inject into the skin. Humalog   Yes [provider]  insulin lispro (HUMALOG) 100 UNIT/ML injection Inject into the skin 3 (three) times daily before meals. Via insulin pump   Yes [provider]  lisinopril (PRINIVIL,ZESTRIL) 20 MG tablet Take 1 tablet (20 mg total) by mouth daily. 03/24/18  Yes Patrecia Pour, Christean Grief, MD  metoprolol tartrate (LOPRESSOR) 50 MG tablet Take 1 tablet (50 mg total) by mouth 2 (two) times daily. 03/23/18  Yes Patrecia Pour, Christean Grief, MD  ondansetron (ZOFRAN) 4 MG tablet Take 1 tablet (4 mg total) by mouth every 6 (six) hours. 03/19/18  Yes Hayden Rasmussen, MD  BAYER CONTOUR NEXT TEST test strip USE AS DIRECTED TO TEST 6 TO 8 TIMES DAILY 10/01/14   Woodroe Mode, MD  Blood Glucose Monitoring Suppl (CONTOUR NEXT EZ MONITOR) W/DEVICE KIT 1 each by Does not apply route 6 (six) times daily. Provide lancets for testing six times daily. Type I DM with diabetes on Insulin Pump  DX 648.03 06/17/13   Woodroe Mode, MD        Physical Exam: BP (!) 192/115   Pulse (!) 113   Resp 19   Ht _0  (1.651 m)   Wt 70.8 kg   LMP 08/22/2018 (LMP Unknown)   SpO2 100%   BMI 25.96 kg/m  General appearance: Well-developed, thin adult female, alert and in mild distress from nausea.   Eyes: Anicteric, conjunctiva pink, lids and lashes normal. PERRL.    ENT: No nasal deformity, discharge, epistaxis.  Hearing normal. OP tacky dry without lesions.  Teeth normal, lips dry. Neck: No neck masses.  Trachea midline.  No thyromegaly/tenderness. Lymph: No cervical or supraclavicular lymphadenopathy. Skin: Warm and dry.  No jaundice.  No suspicious rashes or lesions. Cardiac: Tachycaridc, regular, nl S1-S2, no murmurs appreciated.  Capillary refill is brisk.  JVP not visible.  No LE edema.  Radial pulses 2+ and symmetric. Respiratory: Normal respiratory rate and rhythm.  CTAB without rales or wheezes. Abdomen: Abdomen soft.  No TTP, bowel sounds normal, no guarding. No ascites, distension, hepatosplenomegaly.   MSK: No deformities or effusions.  No cyanosis or clubbing. Neuro: Cranial nerves normal.  Sensation intact to light touch. Speech is fluent.  Muscle strength normal.    Psych: Sensorium intact and responding to questions, attention normal.  Behavior appropriate.  Affect blunted.  Judgment and insight appear normal.     Labs on Admission:  I have personally reviewed following labs and imaging studies: CBC: Recent Labs  Lab 08/29/18 1238  WBC 12.8*  NEUTROABS 11.7*  HGB 11.5*  HCT 34.2*  MCV 75.0*  PLT 170   Basic Metabolic Panel: Recent Labs  Lab 08/29/18 1238  NA 136  K 4.0  CL 105  CO2 17*  GLUCOSE 368*  BUN 14  CREATININE 1.04*  CALCIUM 9.2   GFR: Estimated Creatinine Clearance: 78.8 mL/min (A) (by C-G formula based on SCr of 1.04 mg/dL (H)).  Liver Function Tests: Recent Labs  Lab 08/29/18 1238  AST 15  ALT 10  ALKPHOS 59  BILITOT 0.7  PROT 7.2  ALBUMIN 3.9   Recent Labs  Lab  08/29/18 1238  LIPASE 25   CBG: Recent Labs  Lab 08/29/18 1159  GLUCAP 271*        Radiological Exams on Admission: Personally reviewed CXR shows no pneumonia; AXR report reviewed, no SBO or free air: Dg Abd Acute W/chest  Result Date: 08/29/2018 CLINICAL DATA:  Abdomen pain for 2 days. EXAM: DG ABDOMEN ACUTE W/ 1V CHEST COMPARISON:  Chest x-ray March 19, 2018 FINDINGS: There is no evidence of dilated bowel loops or free intraperitoneal air. Bowel content is identified throughout colon. Pelvic phleboliths are noted. Heart size and mediastinal contours are within normal limits. Both lungs are clear. IMPRESSION: No bowel obstruction. Bowel content is identified throughout: Suggesting constipation. No acute cardiopulmonary disease. Electronically Signed   By: Abelardo Diesel M.D.   On: 08/29/2018 14:39        Assessment/Plan  Intractable nausea and vomiting Likely gastroparesis  Has never seen GI nor had gastric emptying study,  but told by an MD in the hospital once she had gastroparesis.  Given the recurrent time course (has had episodes like this in the past, treated in hospital with anti-emetics and pain meds), suspect this is true.   Does not take Reglan regularly now.  Has never seen GI. -Scheduled Reglan -Ondansetron or Phenergan for breakthrough vomiting -AVOID all opiates -Outpatient GI referral  -Attempt clear liquids in a.m.   Type 1 diabetes with hyperglycemia  pH normal, anion gap normal, doubt DKA.  however glucose is trending up despite her insulin pump. - Start insulin infusion, hourly glucose checks -When glucose less than 250, restart home insulin pump with CGM  Microcytic anemia  This appears to be chronic. -Follow-up with PCP  Hypertension Hypertensive urgency  Blood pressure 190/100 admission. -Restart home lisinopril and metoprolol -Hydralazine IV as needed for excess high blood pressure          DVT prophylaxis: Lovenox Code Status: Full  code Family Communication: None present Disposition Plan: Anticipate the insulin overnight to correct hyperglycemia, continue IV fluids.  In the morning, will attempt clear liquids.  If able to tolerate advancing diet tomorrow, will discharge with new Reglan prescription, and GI referral.  Consults called: None Admission status: Observation At the point of initial evaluation, it is my clinical opinion that admission for OBSERVATION is reasonable and necessary because the patient's presenting complaints in the context of their chronic conditions represent sufficient risk of deterioration or significant morbidity to constitute reasonable grounds for close observation in the hospital setting, but that the patient may be medically stable for discharge from the hospital within 24 to 48 hours.    Medical decision making: Patient seen at 4:35 PM on 08/29/2018.  The patient was discussed with Dr. Tomi Bamberger.  What exists of the patient's chart was reviewed in depth and summarized above.  Clinical condition: stable.        Edwin Dada [facility] Pager [phone]

## 2018-08-29 NOTE — ED Provider Notes (Signed)
[facility] DEPT Provider Note   CSN: [id number] Arrival date & time: 08/29/18  1136     History   Chief Complaint Chief Complaint  Patient presents with  . Hyperglycemia  . Nausea    HPI Kimberly [name] is a 28 y.o. female.  HPI Patient presents to the emergency room for evaluation of nausea vomiting abdominal pain for the last day or so.  Patient has a history of diabetes as well as gastroparesis.  Patient states her blood sugars have been running a little high in the 200s in the last day or so.  She has not missed any of her medications.  She started having multiple episodes of nausea and vomiting.  She has not been able to keep anything down.  She has diffuse abdominal pain.  This is similar to her prior gastroparesis episodes.  She denies any dysuria.  No diarrhea or constipation. Past Medical History:  Diagnosis Date  . Diabetes mellitus without complication ([place])   . Preeclampsia   . Preeclampsia without severe features   . Sickle cell trait [facility])     Patient Active Problem List   Diagnosis Date Noted  . CIN III (cervical intraepithelial neoplasia grade III) with severe dysplasia 09/09/2017  . DKA, type 1 ([place]) 03/17/2015  . Diabetic nephropathy associated with type 1 diabetes mellitus ([place]) 03/17/2015  . Type 1 diabetes mellitus with diabetic nephropathy ([place]) 05/25/2013    Past Surgical History:  Procedure Laterality Date  . WISDOM TOOTH EXTRACTION       OB History    Gravida  1   Para  1   Term      Preterm  1   AB      Living  1     SAB      TAB      Ectopic      Multiple      Live Births  1            Home Medications    Prior to Admission medications   Medication Sig Start Date End Date Taking? Authorizing Provider  etonogestrel (NEXPLANON) 68 MG IMPL implant Inject 1 each into the skin once.   Yes [provider]  famotidine (PEPCID) 20 MG tablet Take 1 tablet (20 mg total) by mouth 2  (two) times daily. 03/23/18  Yes Patrecia Pour, Christean Grief, MD  Insulin Human (INSULIN PUMP) SOLN Inject into the skin. Humalog   Yes [provider]  insulin lispro (HUMALOG) 100 UNIT/ML injection Inject into the skin 3 (three) times daily before meals. Via insulin pump   Yes [provider]  lisinopril (PRINIVIL,ZESTRIL) 20 MG tablet Take 1 tablet (20 mg total) by mouth daily. 03/24/18  Yes Patrecia Pour, Christean Grief, MD  metoprolol tartrate (LOPRESSOR) 50 MG tablet Take 1 tablet (50 mg total) by mouth 2 (two) times daily. 03/23/18  Yes Patrecia Pour, Christean Grief, MD  ondansetron (ZOFRAN) 4 MG tablet Take 1 tablet (4 mg total) by mouth every 6 (six) hours. 03/19/18  Yes Hayden Rasmussen, MD  BAYER CONTOUR NEXT TEST test strip USE AS DIRECTED TO TEST 6 TO 8 TIMES DAILY 10/01/14   Woodroe Mode, MD  Blood Glucose Monitoring Suppl (CONTOUR NEXT EZ MONITOR) W/DEVICE KIT 1 each by Does not apply route 6 (six) times daily. Provide lancets for testing six times daily. Type I DM with diabetes on Insulin Pump  DX 648.03 06/17/13   Woodroe Mode, MD  insulin glargine (  LANTUS) 100 UNIT/ML injection Inject 0.15 mLs (15 Units total) into the skin at bedtime. Patient not taking: Reported on 08/29/2018 03/23/18   Patrecia Pour, Christean Grief, MD  metoCLOPramide (REGLAN) 10 MG tablet Take 1 tablet (10 mg total) by mouth 4 (four) times daily -  before meals and at bedtime. Patient not taking: Reported on 08/29/2018 03/23/18   Doreatha Lew, MD    Family History Family History  Problem Relation Age of Onset  . Lupus Mother   . Aneurysm Mother     Social History Social History   Tobacco Use  . Smoking status: Never Smoker  . Smokeless tobacco: Never Used  Substance Use Topics  . Alcohol use: No  . Drug use: No     Allergies   Patient has no known allergies.   Review of Systems Review of Systems  All other systems reviewed and are negative.    Physical Exam Updated Vital Signs BP (!) 194/113   Pulse  (!) 112   Resp 13   Ht 1.651 m (5' 5" )   Wt 70.8 kg   LMP 08/22/2018 (LMP Unknown)   SpO2 100%   BMI 25.96 kg/m   Physical Exam Vitals signs and nursing note reviewed.  Constitutional:      General: She is not in acute distress.    Appearance: She is well-developed. She is ill-appearing.  HENT:     Head: Normocephalic and atraumatic.     Right Ear: External ear normal.     Left Ear: External ear normal.  Eyes:     General: No scleral icterus.       Right eye: No discharge.        Left eye: No discharge.     Conjunctiva/sclera: Conjunctivae normal.  Neck:     Musculoskeletal: Neck supple.     Trachea: No tracheal deviation.  Cardiovascular:     Rate and Rhythm: Normal rate and regular rhythm.  Pulmonary:     Effort: Pulmonary effort is normal. No respiratory distress.     Breath sounds: Normal breath sounds. No stridor. No wheezing or rales.  Abdominal:     General: Bowel sounds are normal. There is no distension.     Palpations: Abdomen is soft.     Tenderness: There is no abdominal tenderness. There is no guarding or rebound.  Musculoskeletal:        General: No tenderness.  Skin:    General: Skin is warm and dry.     Findings: No rash.  Neurological:     Mental Status: She is alert.     Cranial Nerves: No cranial nerve deficit (no facial droop, extraocular movements intact, no slurred speech).     Sensory: No sensory deficit.     Motor: No abnormal muscle tone or seizure activity.     Coordination: Coordination normal.      ED Treatments / Results  Labs (all labs ordered are listed, but only abnormal results are displayed) Labs Reviewed  COMPREHENSIVE METABOLIC PANEL - Abnormal; Notable for the following components:      Result Value   CO2 17 (*)    Glucose, Bld 368 (*)    Creatinine, Ser 1.04 (*)    All other components within normal limits  CBC WITH DIFFERENTIAL/PLATELET - Abnormal; Notable for the following components:   WBC 12.8 (*)    Hemoglobin  11.5 (*)    HCT 34.2 (*)    MCV 75.0 (*)    MCH 25.2 (*)  Neutro Abs 11.7 (*)    All other components within normal limits  URINALYSIS, ROUTINE W REFLEX MICROSCOPIC - Abnormal; Notable for the following components:   Glucose, UA >=500 (*)    Hgb urine dipstick SMALL (*)    Ketones, ur 20 (*)    Protein, ur >=300 (*)    Bacteria, UA RARE (*)    All other components within normal limits  BLOOD GAS, VENOUS - Abnormal; Notable for the following components:   pCO2, Ven 29.9 (*)    pO2, Ven 45.8 (*)    Bicarbonate 18.3 (*)    Acid-base deficit 4.9 (*)    All other components within normal limits  CBG MONITORING, ED - Abnormal; Notable for the following components:   Glucose-Capillary 271 (*)    All other components within normal limits  LIPASE, BLOOD  I-STAT BETA HCG BLOOD, ED ([facility], [facility], [facility] ONLY)    EKG None  Radiology Dg Abd Acute W/chest  Result Date: 08/29/2018 CLINICAL DATA:  Abdomen pain for 2 days. EXAM: DG ABDOMEN ACUTE W/ 1V CHEST COMPARISON:  Chest x-ray March 19, 2018 FINDINGS: There is no evidence of dilated bowel loops or free intraperitoneal air. Bowel content is identified throughout colon. Pelvic phleboliths are noted. Heart size and mediastinal contours are within normal limits. Both lungs are clear. IMPRESSION: No bowel obstruction. Bowel content is identified throughout: Suggesting constipation. No acute cardiopulmonary disease. Electronically Signed   By: Abelardo Diesel M.D.   On: 08/29/2018 14:39    Procedures Procedures (including critical care time)  Medications Ordered in ED Medications  sodium chloride 0.9 % bolus 1,000 mL (0 mLs Intravenous Stopped 08/29/18 1357)    And  0.9 %  sodium chloride infusion (has no administration in time range)  labetalol (NORMODYNE) tablet 100 mg (has no administration in time range)  ondansetron (ZOFRAN) injection 4 mg (has no administration in time range)  ondansetron (ZOFRAN) injection 4 mg (4 mg Intravenous Given 08/29/18  1250)  morphine 4 MG/ML injection 4 mg (4 mg Intravenous Given 08/29/18 1250)     Initial Impression / Assessment and Plan / ED Course  I have reviewed the triage vital signs and the nursing notes.  Pertinent labs & imaging results that were available during my care of the patient were reviewed by me and considered in my medical decision making (see chart for details).  Clinical Course as of Aug 30 1555  Wed Aug 29, 2018  1504 HTN noted.  Will give a dose of labetalol.  Pt has not been able to take her medications with the nausea and ovomiting   [JK]  1504 Labs reviewed.  DKA.  Bicarb decreased but ph normal and only mild gap.  Will continue with fluids.  Nausea and vomiting have improved   [JK]  1553 Pt attempted to keep down fluids but she states she started vomiting again.   [JK]    Clinical Course User Index [JK] Dorie Rank, MD    Patient presented to the emergency room with complaints of nausea vomiting and abdominal pain.  Patient has a history of diabetes.  Laboratory test shows decreased bicarb with a slight increase in the anion gap but her pH is normal.  Tried IV hydration and antiemetics.  Patient was doing well for couple of hours but she started vomiting again.  Her blood pressure was also rather elevated.  I have ordered p.o. medications initially.  With her persistent vomiting, dehydration and electrolyte abnormalities I will consult the medical  service for further treatment.  Final Clinical Impressions(s) / ED Diagnoses   Final diagnoses:  Nausea and vomiting, intractability of vomiting not specified, unspecified vomiting type  Dehydration  Hypertension, unspecified type      Dorie Rank, MD 08/29/18 1558

## 2018-08-29 NOTE — ED Notes (Signed)
BHCG result is negative

## 2018-08-29 NOTE — ED Triage Notes (Signed)
Pt BIB EMS from home w/ c/o n/v x1 day.  Also reports high blood sugar, abdominal pain, epigastric pain.   Received 4 mg Zofran IM, 325 ASA en route.

## 2018-08-29 NOTE — ED Notes (Signed)
This RN called to room, pt c/o right arm pain, swelling.  Upon assessment, arm is swollen, firm at IV site due to IV infiltration.  MD made aware, IV removed and hot compresses applied.

## 2018-08-29 NOTE — ED Notes (Signed)
Bed: WA01 Expected date:  Expected time:  Means of arrival:  Comments: EMS 

## 2018-08-29 NOTE — ED Notes (Signed)
Pt wheeled to bathroom for urine sample.

## 2018-08-29 NOTE — Progress Notes (Addendum)
MD notfied of pt'st blood sugar being 247.Ordered to not begin insulin pump at this time and check cbg q4 with sliding scale for tonight.

## 2018-08-29 NOTE — ED Notes (Signed)
ED TO INPATIENT HANDOFF REPORT  Name/Age/Gender Kimberly [name] 28 y.o. female  Code Status Code Status History    Date Active Date Inactive Code Status Order ID Comments User Context   03/19/2018 2314 03/23/2018 1709 Full Code [id number]  Etta Quill, DO ED   03/17/2015 2043 03/22/2015 1533 Full Code [id number]  Allyne Gee, MD Inpatient   10/23/2013 2142 10/25/2013 1723 Full Code [id number]  Kassie Mends, MD Inpatient   10/21/2013 1340 10/23/2013 2142 Full Code [id number]  Lorene Dy, CNM Inpatient   10/21/2013 1225 10/21/2013 1340 Full Code [id number]  Lorene Dy, CNM Inpatient      Home/SNF/Other Home  Chief Complaint nausea; hyperglycemia  Level of Care/Admitting Diagnosis ED Disposition    ED Disposition Condition [facility] Area: [facility] [100102]  Level of Care: Med-Surg [16]  Diagnosis: Intractable nausea and vomiting [638756]  Admitting Physician: Edwin Dada [[id number]]  Attending Physician: Edwin Dada [[id number]]  PT Class (Do Not Modify): Observation [104]  PT Acc Code (Do Not Modify): Observation [10022]       Medical History Past Medical History:  Diagnosis Date  . Diabetes mellitus without complication ([place])   . Preeclampsia   . Preeclampsia without severe features   . Sickle cell trait ([facility])     Allergies No Known Allergies  IV Location/Drains/Wounds Patient Lines/Drains/Airways Status   Active Line/Drains/Airways    None          Labs/Imaging Results for orders placed or performed during the hospital encounter of 08/29/18 (from the past 48 hour(s))  CBG monitoring, ED     Status: Abnormal   Collection Time: 08/29/18 11:59 AM  Result Value Ref Range   Glucose-Capillary 271 (H) 70 - 99 mg/dL  Comprehensive metabolic panel     Status: Abnormal   Collection Time: 08/29/18 12:38 PM  Result Value Ref Range   Sodium 136 135 - 145 mmol/L   Potassium 4.0 3.5 - 5.1 mmol/L   Chloride 105 98 - 111 mmol/L   CO2 17 (L) 22 - 32 mmol/L   Glucose, Bld 368 (H) 70 - 99 mg/dL   BUN 14 6 - 20 mg/dL   Creatinine, Ser 1.04 (H) 0.44 - 1.00 mg/dL   Calcium 9.2 8.9 - 10.3 mg/dL   Total Protein 7.2 6.5 - 8.1 g/dL   Albumin 3.9 3.5 - 5.0 g/dL   AST 15 15 - 41 U/L   ALT 10 0 - 44 U/L   Alkaline Phosphatase 59 38 - 126 U/L   Total Bilirubin 0.7 0.3 - 1.2 mg/dL   GFR calc non Af Amer >60 >60 mL/min   GFR calc Af Amer >60 >60 mL/min   Anion gap 14 5 - 15    Comment: Performed at [facility], [place] [address] [place], [place] [address]  Lipase, blood     Status: None   Collection Time: 08/29/18 12:38 PM  Result Value Ref Range   Lipase 25 11 - 51 U/L    Comment: Performed at [facility], [place] [address], [place], [place] [address]  CBC WITH DIFFERENTIAL     Status: Abnormal   Collection Time: 08/29/18 12:38 PM  Result Value Ref Range   WBC 12.8 (H) 4.0 - 10.5 K/uL   RBC 4.56 3.87 - 5.11 MIL/uL   Hemoglobin 11.5 (L) 12.0 - 15.0 g/dL   HCT 34.2 (L) 36.0 - 46.0 %   MCV  75.0 (L) 80.0 - 100.0 fL   MCH 25.2 (L) 26.0 - 34.0 pg   MCHC 33.6 30.0 - 36.0 g/dL   RDW 13.3 11.5 - 15.5 %   Platelets 281 150 - 400 K/uL   nRBC 0.0 0.0 - 0.2 %   Neutrophils Relative % 93 %   Neutro Abs 11.7 (H) 1.7 - 7.7 K/uL   Lymphocytes Relative 6 %   Lymphs Abs 0.8 0.7 - 4.0 K/uL   Monocytes Relative 1 %   Monocytes Absolute 0.2 0.1 - 1.0 K/uL   Eosinophils Relative 0 %   Eosinophils Absolute 0.0 0.0 - 0.5 K/uL   Basophils Relative 0 %   Basophils Absolute 0.0 0.0 - 0.1 K/uL   Immature Granulocytes 0 %   Abs Immature Granulocytes 0.04 0.00 - 0.07 K/uL    Comment: Performed at [facility], [place] [address], [place], [place] [address]  I-Stat beta hCG blood, ED     Status: None   Collection Time: 08/29/18 12:43 PM  Result Value Ref Range   I-stat hCG, quantitative <5.0 <5 mIU/mL   Comment 3            Comment:   GEST. AGE      CONC.   (mIU/mL)   <=1 WEEK        5 - 50     2 WEEKS       50 - 500     3 WEEKS       100 - 10,000     4 WEEKS     1,000 - 30,000        FEMALE AND NON-PREGNANT FEMALE:     LESS THAN 5 mIU/mL   Blood gas, venous     Status: Abnormal   Collection Time: 08/29/18 12:44 PM  Result Value Ref Range   pH, Ven 7.404 7.250 - 7.430   pCO2, Ven 29.9 (L) 44.0 - 60.0 mmHg   pO2, Ven 45.8 (H) 32.0 - 45.0 mmHg   Bicarbonate 18.3 (L) 20.0 - 28.0 mmol/L   Acid-base deficit 4.9 (H) 0.0 - 2.0 mmol/L   O2 Saturation 79.9 %   Patient temperature 98.6    Collection site VEIN    Drawn by DRAWN BY RN    Sample type VEIN     Comment: Performed at [facility] [address], [place], [place] [address]  Urinalysis, Routine w reflex microscopic     Status: Abnormal   Collection Time: 08/29/18  3:15 PM  Result Value Ref Range   Color, Urine YELLOW YELLOW   APPearance CLEAR CLEAR   Specific Gravity, Urine 1.014 1.005 - 1.030   pH 6.0 5.0 - 8.0   Glucose, UA >=500 (A) NEGATIVE mg/dL   Hgb urine dipstick SMALL (A) NEGATIVE   Bilirubin Urine NEGATIVE NEGATIVE   Ketones, ur 20 (A) NEGATIVE mg/dL   Protein, ur >=300 (A) NEGATIVE mg/dL   Nitrite NEGATIVE NEGATIVE   Leukocytes, UA NEGATIVE NEGATIVE   RBC / HPF 21-50 0 - 5 RBC/hpf   WBC, UA 0-5 0 - 5 WBC/hpf   Bacteria, UA RARE (A) NONE SEEN   Squamous Epithelial / LPF 0-5 0 - 5   Mucus PRESENT    Hyaline Casts, UA PRESENT     Comment: Performed at [facility], [place] [address] [place], [place] [address]   Dg Abd Acute W/chest  Result Date: 08/29/2018 CLINICAL DATA:  Abdomen pain for 2 days. EXAM: DG  ABDOMEN ACUTE W/ 1V CHEST COMPARISON:  Chest x-ray March 19, 2018 FINDINGS: There is no evidence of dilated bowel loops or free intraperitoneal air. Bowel content is identified throughout colon. Pelvic phleboliths are noted. Heart size and mediastinal contours are within normal limits. Both lungs are clear. IMPRESSION: No bowel  obstruction. Bowel content is identified throughout: Suggesting constipation. No acute cardiopulmonary disease. Electronically Signed   By: Abelardo Diesel M.D.   On: 08/29/2018 14:39    Pending Labs Unresulted Labs (From admission, onward)   None      Vitals/Pain Today's Vitals   08/29/18 1356 08/29/18 1400 08/29/18 1438 08/29/18 1500  BP:  (!) 185/100 (!) 194/113 (!) 195/114  Pulse:  (!) 114 (!) 112 (!) 115  Resp:  13  19  SpO2:  100% 100% 100%  Weight:      Height:      PainSc: 6        Isolation Precautions No active isolations  Medications Medications  sodium chloride 0.9 % bolus 1,000 mL (0 mLs Intravenous Stopped 08/29/18 1357)    And  0.9 %  sodium chloride infusion (has no administration in time range)  labetalol (NORMODYNE) tablet 100 mg (has no administration in time range)  ondansetron (ZOFRAN) injection 4 mg (has no administration in time range)  insulin aspart (novoLOG) injection 8 Units (has no administration in time range)  ondansetron (ZOFRAN) injection 4 mg (4 mg Intravenous Given 08/29/18 1250)  morphine 4 MG/ML injection 4 mg (4 mg Intravenous Given 08/29/18 1250)    Mobility walks

## 2018-08-30 DIAGNOSIS — R112 Nausea with vomiting, unspecified: Secondary | ICD-10-CM

## 2018-08-30 LAB — CBC
HCT: 38.3 % (ref 36.0–46.0)
Hemoglobin: 12.5 g/dL (ref 12.0–15.0)
MCH: 25.3 pg — ABNORMAL LOW (ref 26.0–34.0)
MCHC: 32.6 g/dL (ref 30.0–36.0)
MCV: 77.4 fL — ABNORMAL LOW (ref 80.0–100.0)
Platelets: 309 10*3/uL (ref 150–400)
RBC: 4.95 MIL/uL (ref 3.87–5.11)
RDW: 13.5 % (ref 11.5–15.5)
WBC: 13.5 10*3/uL — ABNORMAL HIGH (ref 4.0–10.5)
nRBC: 0 % (ref 0.0–0.2)

## 2018-08-30 LAB — BASIC METABOLIC PANEL
Anion gap: 16 — ABNORMAL HIGH (ref 5–15)
Anion gap: 13 (ref 5–15)
Anion gap: 14 (ref 5–15)
Anion gap: 13 (ref 5–15)
BUN: 20 mg/dL (ref 6–20)
BUN: 21 mg/dL — AB (ref 6–20)
BUN: 20 mg/dL (ref 6–20)
BUN: 19 mg/dL (ref 6–20)
CHLORIDE: 115 mmol/L — AB (ref 98–111)
CO2: 18 mmol/L — ABNORMAL LOW (ref 22–32)
CO2: 19 mmol/L — ABNORMAL LOW (ref 22–32)
CO2: 19 mmol/L — ABNORMAL LOW (ref 22–32)
CO2: 18 mmol/L — ABNORMAL LOW (ref 22–32)
Calcium: 9.3 mg/dL (ref 8.9–10.3)
Calcium: 8.9 mg/dL (ref 8.9–10.3)
Calcium: 9 mg/dL (ref 8.9–10.3)
Calcium: 9 mg/dL (ref 8.9–10.3)
Chloride: 111 mmol/L (ref 98–111)
Chloride: 116 mmol/L — ABNORMAL HIGH (ref 98–111)
Chloride: 113 mmol/L — ABNORMAL HIGH (ref 98–111)
Creatinine, Ser: 1.21 mg/dL — ABNORMAL HIGH (ref 0.44–1.00)
Creatinine, Ser: 1.26 mg/dL — ABNORMAL HIGH (ref 0.44–1.00)
Creatinine, Ser: 1.22 mg/dL — ABNORMAL HIGH (ref 0.44–1.00)
Creatinine, Ser: 1.28 mg/dL — ABNORMAL HIGH (ref 0.44–1.00)
GFR calc Af Amer: >60 mL/min (ref >60)
GFR calc Af Amer: >60 mL/min (ref >60)
GFR calc Af Amer: >60 mL/min (ref >60)
GFR calc Af Amer: >60 mL/min (ref >60)
GFR calc non Af Amer: 58 mL/min — ABNORMAL LOW (ref >60)
GFR calc non Af Amer: 60 mL/min — ABNORMAL LOW (ref >60)
GFR calc non Af Amer: 56 mL/min — ABNORMAL LOW (ref >60)
GLUCOSE: 294 mg/dL — AB (ref 70–99)
Glucose, Bld: 198 mg/dL — ABNORMAL HIGH (ref 70–99)
Glucose, Bld: 93 mg/dL (ref 70–99)
Glucose, Bld: 150 mg/dL — ABNORMAL HIGH (ref 70–99)
POTASSIUM: 3.6 mmol/L (ref 3.5–5.1)
Potassium: 3.7 mmol/L (ref 3.5–5.1)
Potassium: 3.4 mmol/L — ABNORMAL LOW (ref 3.5–5.1)
Potassium: 3.6 mmol/L (ref 3.5–5.1)
SODIUM: 147 mmol/L — AB (ref 135–145)
Sodium: 145 mmol/L (ref 135–145)
Sodium: 149 mmol/L — ABNORMAL HIGH (ref 135–145)
Sodium: 144 mmol/L (ref 135–145)

## 2018-08-30 LAB — GLUCOSE, CAPILLARY
GLUCOSE-CAPILLARY: 151 mg/dL — AB (ref 70–99)
GLUCOSE-CAPILLARY: 47 mg/dL — AB (ref 70–99)
Glucose-Capillary: 243 mg/dL — ABNORMAL HIGH (ref 70–99)
Glucose-Capillary: 287 mg/dL — ABNORMAL HIGH (ref 70–99)
Glucose-Capillary: 179 mg/dL — ABNORMAL HIGH (ref 70–99)
Glucose-Capillary: 67 mg/dL — ABNORMAL LOW (ref 70–99)
Glucose-Capillary: 48 mg/dL — ABNORMAL LOW (ref 70–99)
Glucose-Capillary: 59 mg/dL — ABNORMAL LOW (ref 70–99)
Glucose-Capillary: 57 mg/dL — ABNORMAL LOW (ref 70–99)
Glucose-Capillary: 100 mg/dL — ABNORMAL HIGH (ref 70–99)
Glucose-Capillary: 141 mg/dL — ABNORMAL HIGH (ref 70–99)

## 2018-08-30 MED ORDER — METOPROLOL TARTRATE 5 MG/5ML IV SOLN
5.0000 mg | Freq: Once | INTRAVENOUS | Status: AC
  Administered 2018-08-30: 5 mg via INTRAVENOUS
  Filled 2018-08-30: qty 5

## 2018-08-30 MED ORDER — DEXTROSE 5 % IV SOLN
INTRAVENOUS | Status: DC
  Administered 2018-08-30: 17:00:00 via INTRAVENOUS

## 2018-08-30 MED ORDER — GLUCOSE 4 G PO CHEW
CHEWABLE_TABLET | ORAL | Status: AC
  Administered 2018-08-30: 18:00:00
  Filled 2018-08-30: qty 1

## 2018-08-30 MED ORDER — PANTOPRAZOLE SODIUM 40 MG PO PACK
40.0000 mg | PACK | Freq: Every day | ORAL | Status: DC
  Administered 2018-08-30 – 2018-08-31 (×2): 40 mg via ORAL
  Filled 2018-08-30 (×2): qty 20

## 2018-08-30 MED ORDER — INSULIN DETEMIR 100 UNIT/ML ~~LOC~~ SOLN
10.0000 [IU] | Freq: Every day | SUBCUTANEOUS | Status: DC
  Administered 2018-08-31: 10 [IU] via SUBCUTANEOUS
  Filled 2018-08-30: qty 0.1

## 2018-08-30 MED ORDER — GLUCOSE 40 % PO GEL
ORAL | Status: AC
  Filled 2018-08-30: qty 1

## 2018-08-30 MED ORDER — HYDRALAZINE HCL 20 MG/ML IJ SOLN
5.0000 mg | INTRAMUSCULAR | Status: AC
  Administered 2018-08-30: 5 mg via INTRAVENOUS
  Filled 2018-08-30: qty 1

## 2018-08-30 MED ORDER — INSULIN DETEMIR 100 UNIT/ML ~~LOC~~ SOLN
10.0000 [IU] | Freq: Two times a day (BID) | SUBCUTANEOUS | Status: DC
  Administered 2018-08-30: 10 [IU] via SUBCUTANEOUS
  Filled 2018-08-30 (×3): qty 0.1

## 2018-08-30 MED ORDER — INSULIN ASPART 100 UNIT/ML ~~LOC~~ SOLN
0.0000 [IU] | Freq: Three times a day (TID) | SUBCUTANEOUS | Status: DC
  Administered 2018-08-30: 5 [IU] via SUBCUTANEOUS
  Administered 2018-08-30: 2 [IU] via SUBCUTANEOUS
  Administered 2018-08-31: 9 [IU] via SUBCUTANEOUS

## 2018-08-30 MED ORDER — POTASSIUM CHLORIDE 10 MEQ/100ML IV SOLN
10.0000 meq | INTRAVENOUS | Status: AC
  Administered 2018-08-30 (×5): 10 meq via INTRAVENOUS
  Filled 2018-08-30 (×2): qty 100

## 2018-08-30 MED ORDER — INSULIN ASPART 100 UNIT/ML ~~LOC~~ SOLN: 0.0000 [IU] | Freq: Every day | SUBCUTANEOUS | Status: DC

## 2018-08-30 MED ORDER — LORAZEPAM 2 MG/ML IJ SOLN
0.5000 mg | Freq: Once | INTRAMUSCULAR | Status: AC
  Administered 2018-08-30: 0.5 mg via INTRAVENOUS
  Filled 2018-08-30: qty 1

## 2018-08-30 MED ORDER — INSULIN ASPART 100 UNIT/ML ~~LOC~~ SOLN
3.0000 [IU] | Freq: Three times a day (TID) | SUBCUTANEOUS | Status: DC
  Administered 2018-08-30 (×2): 3 [IU] via SUBCUTANEOUS

## 2018-08-30 MED ORDER — SODIUM CHLORIDE 0.9 % IV BOLUS
1000.0000 mL | Freq: Once | INTRAVENOUS | Status: AC
  Administered 2018-08-30: 1000 mL via INTRAVENOUS

## 2018-08-30 MED ORDER — SODIUM CHLORIDE 0.45 % IV SOLN
INTRAVENOUS | Status: DC
  Administered 2018-08-30: 16:00:00 via INTRAVENOUS

## 2018-08-30 NOTE — Significant Event (Addendum)
Rapid Response Event Note  Overview: Time Called: 2033 Arrival Time: 2036 Event Type: Other (Comment)(Anxiety,nausea & vomiting, elevated blood pressure)  Initial Focused Assessment: Patient sitting up in chair, rocking. Patient appears anxious, is oriented x4. As I begin to do my assessment, patient begins to vomit. Vomit is clear, green. Patient sits back in chair, no longer rocking and informs me as to why she was admitted to the hospital. Patient complains of 7/10 pain under her left breast, in her stomach and lower abdomen, and across her chest. Bowel sounds faint, active. Patient states her last BM was on 08/29/18, but that she hasn't been able to keep food down.  Lung sounds clear in all lobes, auscultated after patient vomited.   Interventions: -PRN Phenergan given -PRN Hydralazine given earlier, not due to be given again -Primary RN spoke with Lamar Blinks, NP order received for Ativan IV to be given now. -Patient was admitted with complaints nausea, vomiting and epigastric pain- attending is aware, per progress note.  -12 lead EKG completed due to patient having elevated blood pressure, complaints of chest and epigastric pain, as well as nausea.   Plan of Care (if not transferred): RN to continue to manage patient's blood pressure and nausea. RN to continue to monitor and treat patient's pain. NP stated she would come and assess patient. RN to consult rapid response for further needs.   RN assessing patient's ability of taking scheduled PO medications since she is nauseated/vomiting, may require requesting medication orders to have the route changed if nausea does not subside.   Event Summary:  Kimberly [name]

## 2018-08-30 NOTE — Progress Notes (Signed)
Patient with episode hypoglycemia this afternoon. Asymptomatic with no complaints or concerns  1507 CBG 67; pt given apple juice as this is what works for her at home.   1532 CBG 59; given orange juice  1603 CBG 48; given glucose tablet x1  1631 CBG 47; given apple juice again - pt refused glucose gel  1705 CBG 57; cont with apple juice  1749 CBG 100  New orders received from physician; IVF changed and carb mod diet ordered.

## 2018-08-30 NOTE — Progress Notes (Signed)
NP on call notified of pts bp being 197/111 and pt feeling tightness in her chest. Orders given. Will continue to monitor pt.

## 2018-08-30 NOTE — Progress Notes (Signed)
Rapid called regarding patient status, pt complaint of chest pains, numbness on right side, pulse was 102, resp 22, BP 168/104. Patient stated she could not breathe, put on 2 liter of O2 per nasal cannula. Doctor notified, Rapid nurse arrived and assessed patient. Patient will stay on unit for now and doctor will be by to see patient. Order given for 0.5 mg of Ativan, patient stated she has HX of anxiety.

## 2018-08-30 NOTE — Plan of Care (Signed)
Patient in bed this morning. IV infusing. Pain starting to increase slowly. Will continue to monitor.

## 2018-08-30 NOTE — Progress Notes (Addendum)
[facility] PROGRESS NOTE    Progress Note  Kimberly [name]  RWE:[id number] DOB: 01-12-1991 DOA: 08/29/2018 PCP: Foye Spurling, MD     Brief Narrative:   Kimberly [name] is an 28 y.o. female past medical history of essential hypertension, diabetes mellitus type 2 on insulin pump woke up the day of admission with nausea vomiting epigastric abdominal pain  Assessment/Plan:   Intractable nausea and vomiting possible due to gastroparesis vs DKA: Never seen a gastroenterologist in the past.  Has been told by physicians he has gastroparesis but has never had a gastric emptying study. Will try to avoid narcotics. Agree with , Zofran. SHe Not take Reglan, as it causes her to be very anxious and with tremors. Continue a clear liquid diet.  Mild DKA type 1 diabetes mellitus with diabetic nephropathy ([place]) She has been off her insulin pump only on sliding scale. She has an anion gap of 14 with a bicarb of 18. We will give her a bolus of normal saline and continue her on her normal saline infusion for the next 24 hours. We will start her on long-acting insulin sliding scale insulin we will check a basic metabolic panel this afternoon and this morning  Microcytic anemia Follow-up with PCP as an outpatient likely iron deficient menstruating fertile female.  Essential hypertension: Ongoing vomiting will elevate your blood pressure Continue home dose lisinopril metoprolol. Use hydralazine IV PRN for elevated blood pressure  Acute kidney injury: Likely prerenal azotemia continue IV fluids recheck a basic metabolic panel in the morning.  DVT prophylaxis: lovenox Family Communication:none Disposition Plan/Barrier to D/C: once DKA resolved Code Status:     Code Status Orders  (From admission, onward)         Start     Ordered   08/29/18 1706  Full code  Continuous     08/29/18 1705        Code Status History    Date Active Date Inactive Code Status Order ID Comments  User Context   03/19/2018 2314 03/23/2018 1709 Full Code [id number]  Etta Quill, DO ED   03/17/2015 2043 03/22/2015 1533 Full Code [id number]  Allyne Gee, MD Inpatient   10/23/2013 2142 10/25/2013 1723 Full Code [id number]  Kassie Mends, MD Inpatient   10/21/2013 1340 10/23/2013 2142 Full Code [id number]  Lorene Dy, CNM Inpatient   10/21/2013 1225 10/21/2013 1340 Full Code [id number]  Lorene Dy, CNM Inpatient        IV Access:    Peripheral IV   Procedures and diagnostic studies:   Dg Abd Acute W/chest  Result Date: 08/29/2018 CLINICAL DATA:  Abdomen pain for 2 days. EXAM: DG ABDOMEN ACUTE W/ 1V CHEST COMPARISON:  Chest x-ray March 19, 2018 FINDINGS: There is no evidence of dilated bowel loops or free intraperitoneal air. Bowel content is identified throughout colon. Pelvic phleboliths are noted. Heart size and mediastinal contours are within normal limits. Both lungs are clear. IMPRESSION: No bowel obstruction. Bowel content is identified throughout: Suggesting constipation. No acute cardiopulmonary disease. Electronically Signed   By: Abelardo Diesel M.D.   On: 08/29/2018 14:39     Medical Consultants:    None.  Anti-Infectives:   None  Subjective:    Kimberly [name] he still nauseated and with a headache.  Objective:    Vitals:   08/29/18 1707 08/29/18 2244 08/30/18 0606 08/30/18 0646  BP: (!) 192/115 (!) 153/93 (!) 197/117 (!) 176/101  Pulse: (!) 113 87 (!)  102   Resp:  16 (!) 24   Temp:  98.4 F (36.9 C) 97.6 F (36.4 C)   TempSrc:  Oral Oral   SpO2: 100% 100% 100%   Weight: 64.1 kg     Height: 5\' 5"  (1.651 m)       Intake/Output Summary (Last 24 hours) at 08/30/2018 0738 Last data filed at 08/30/2018 0606 Gross per 24 hour  Intake 2388.27 ml  Output 950 ml  Net 1438.27 ml   Filed Weights   08/29/18 1207 08/29/18 1707  Weight: 70.8 kg 64.1 kg    Exam: General exam: In no acute distress, tired appearing Respiratory system: Good air movement and  clear to auscultation Cardiovascular system: S1 & S2 heard, RRR.  Gastrointestinal system: Bowel sounds soft nontender nondistended Central nervous system: Alert and oriented. No focal neurological deficits. Extremities: No pedal edema. Skin: No rashes or edema Psychiatry: Judgement and insight appear normal. Mood & affect appropriate.    Data Reviewed:    Labs: Basic Metabolic Panel: Recent Labs  Lab 08/29/18 1238 08/30/18 0555  NA 136 145  K 4.0 3.7  CL 105 111  CO2 17* 18*  GLUCOSE 368* 294*  BUN 14 20  CREATININE 1.04* 1.21*  CALCIUM 9.2 9.3   GFR Estimated Creatinine Clearance: 61.7 mL/min (A) (by C-G formula based on SCr of 1.21 mg/dL (H)). Liver Function Tests: Recent Labs  Lab 08/29/18 1238  AST 15  ALT 10  ALKPHOS 59  BILITOT 0.7  PROT 7.2  ALBUMIN 3.9   Recent Labs  Lab 08/29/18 1238  LIPASE 25   No results for input(s): AMMONIA in the last 168 hours. Coagulation profile No results for input(s): INR, PROTIME in the last 168 hours.  CBC: Recent Labs  Lab 08/29/18 1238 08/30/18 0555  WBC 12.8* 13.5*  NEUTROABS 11.7*  --   HGB 11.5* 12.5  HCT 34.2* 38.3  MCV 75.0* 77.4*  PLT 281 309   Cardiac Enzymes: No results for input(s): CKTOTAL, CKMB, CKMBINDEX, TROPONINI in the last 168 hours. BNP (last 3 results) No results for input(s): PROBNP in the last 8760 hours. CBG: Recent Labs  Lab 08/29/18 1159 08/29/18 1938 08/29/18 2027 08/30/18 0409 08/30/18 0733  GLUCAP 271* 247* 203* 243* 287*   D-Dimer: No results for input(s): DDIMER in the last 72 hours. Hgb A1c: No results for input(s): HGBA1C in the last 72 hours. Lipid Profile: No results for input(s): CHOL, HDL, LDLCALC, TRIG, CHOLHDL, LDLDIRECT in the last 72 hours. Thyroid function studies: No results for input(s): TSH, T4TOTAL, T3FREE, THYROIDAB in the last 72 hours.  Invalid input(s): FREET3 Anemia work up: No results for input(s): VITAMINB12, FOLATE, FERRITIN, TIBC, IRON,  RETICCTPCT in the last 72 hours. Sepsis Labs: Recent Labs  Lab 08/29/18 1238 08/30/18 0555  WBC 12.8* 13.5*   Microbiology No results found for this or any previous visit (from the past 240 hour(s)).   Medications:   . enoxaparin (LOVENOX) injection  40 mg Subcutaneous Q24H  . insulin aspart  0-9 Units Subcutaneous Q4H  . lisinopril  20 mg Oral Daily  . metoCLOPramide (REGLAN) injection  10 mg Intravenous Q8H  . metoprolol tartrate  50 mg Oral BID   Continuous Infusions: . sodium chloride 125 mL/hr at 08/30/18 0600      LOS: 0 days   Charlynne Cousins  [facility]   *Please refer to [email or web address], password [email or web address] to get updated schedule on who will round on this patient, as hospitalists switch teams  weekly. If 7PM-7AM, please contact night-coverage at [email or web address], password [email or web address] for any overnight needs.  08/30/2018, 7:38 AM

## 2018-08-31 DIAGNOSIS — E1010 Type 1 diabetes mellitus with ketoacidosis without coma: Secondary | ICD-10-CM | POA: Diagnosis not present

## 2018-08-31 DIAGNOSIS — R112 Nausea with vomiting, unspecified: Secondary | ICD-10-CM | POA: Diagnosis not present

## 2018-08-31 DIAGNOSIS — E1021 Type 1 diabetes mellitus with diabetic nephropathy: Secondary | ICD-10-CM | POA: Diagnosis not present

## 2018-08-31 DIAGNOSIS — D509 Iron deficiency anemia, unspecified: Secondary | ICD-10-CM | POA: Diagnosis not present

## 2018-08-31 LAB — BASIC METABOLIC PANEL
ANION GAP: 12 (ref 5–15)
ANION GAP: 13 (ref 5–15)
Anion gap: 10 (ref 5–15)
Anion gap: 15 (ref 5–15)
Anion gap: 15 (ref 5–15)
BUN: 18 mg/dL (ref 6–20)
BUN: 18 mg/dL (ref 6–20)
BUN: 19 mg/dL (ref 6–20)
BUN: 19 mg/dL (ref 6–20)
BUN: 23 mg/dL — ABNORMAL HIGH (ref 6–20)
CALCIUM: 8.6 mg/dL — AB (ref 8.9–10.3)
CO2: 16 mmol/L — ABNORMAL LOW (ref 22–32)
CO2: 18 mmol/L — ABNORMAL LOW (ref 22–32)
CO2: 19 mmol/L — ABNORMAL LOW (ref 22–32)
CO2: 22 mmol/L (ref 22–32)
CO2: 22 mmol/L (ref 22–32)
Calcium: 8.3 mg/dL — ABNORMAL LOW (ref 8.9–10.3)
Calcium: 8.5 mg/dL — ABNORMAL LOW (ref 8.9–10.3)
Calcium: 8.5 mg/dL — ABNORMAL LOW (ref 8.9–10.3)
Calcium: 8.9 mg/dL (ref 8.9–10.3)
Chloride: 107 mmol/L (ref 98–111)
Chloride: 107 mmol/L (ref 98–111)
Chloride: 108 mmol/L (ref 98–111)
Chloride: 111 mmol/L (ref 98–111)
Chloride: 113 mmol/L — ABNORMAL HIGH (ref 98–111)
Creatinine, Ser: 1.11 mg/dL — ABNORMAL HIGH (ref 0.44–1.00)
Creatinine, Ser: 1.17 mg/dL — ABNORMAL HIGH (ref 0.44–1.00)
Creatinine, Ser: 1.24 mg/dL — ABNORMAL HIGH (ref 0.44–1.00)
Creatinine, Ser: 1.24 mg/dL — ABNORMAL HIGH (ref 0.44–1.00)
Creatinine, Ser: 1.5 mg/dL — ABNORMAL HIGH (ref 0.44–1.00)
GFR calc Af Amer: >60 mL/min (ref >60)
GFR calc Af Amer: >60 mL/min (ref >60)
GFR calc Af Amer: >60 mL/min (ref >60)
GFR calc Af Amer: >60 mL/min (ref >60)
GFR calc non Af Amer: 59 mL/min — ABNORMAL LOW (ref >60)
GFR calc non Af Amer: >60 mL/min (ref >60)
GFR calc non Af Amer: >60 mL/min (ref >60)
GFR, EST AFRICAN AMERICAN: 54 mL/min — AB (ref >60)
GFR, EST NON AFRICAN AMERICAN: 59 mL/min — AB (ref >60)
GFR, EST NON AFRICAN AMERICAN: 47 mL/min — AB (ref >60)
Glucose, Bld: 112 mg/dL — ABNORMAL HIGH (ref 70–99)
Glucose, Bld: 170 mg/dL — ABNORMAL HIGH (ref 70–99)
Glucose, Bld: 234 mg/dL — ABNORMAL HIGH (ref 70–99)
Glucose, Bld: 339 mg/dL — ABNORMAL HIGH (ref 70–99)
Glucose, Bld: 390 mg/dL — ABNORMAL HIGH (ref 70–99)
Potassium: 3.1 mmol/L — ABNORMAL LOW (ref 3.5–5.1)
Potassium: 3.5 mmol/L (ref 3.5–5.1)
Potassium: 3.6 mmol/L (ref 3.5–5.1)
Potassium: 3.6 mmol/L (ref 3.5–5.1)
Potassium: 3.7 mmol/L (ref 3.5–5.1)
Sodium: 138 mmol/L (ref 135–145)
Sodium: 140 mmol/L (ref 135–145)
Sodium: 142 mmol/L (ref 135–145)
Sodium: 142 mmol/L (ref 135–145)
Sodium: 146 mmol/L — ABNORMAL HIGH (ref 135–145)

## 2018-08-31 LAB — GLUCOSE, CAPILLARY
GLUCOSE-CAPILLARY: 210 mg/dL — AB (ref 70–99)
Glucose-Capillary: 100 mg/dL — ABNORMAL HIGH (ref 70–99)
Glucose-Capillary: 109 mg/dL — ABNORMAL HIGH (ref 70–99)
Glucose-Capillary: 143 mg/dL — ABNORMAL HIGH (ref 70–99)
Glucose-Capillary: 151 mg/dL — ABNORMAL HIGH (ref 70–99)
Glucose-Capillary: 175 mg/dL — ABNORMAL HIGH (ref 70–99)
Glucose-Capillary: 179 mg/dL — ABNORMAL HIGH (ref 70–99)
Glucose-Capillary: 210 mg/dL — ABNORMAL HIGH (ref 70–99)
Glucose-Capillary: 425 mg/dL — ABNORMAL HIGH (ref 70–99)
Glucose-Capillary: 44 mg/dL — CL (ref 70–99)
Glucose-Capillary: 51 mg/dL — ABNORMAL LOW (ref 70–99)

## 2018-08-31 LAB — HEMOGLOBIN A1C
Hgb A1c MFr Bld: 7.5 % — ABNORMAL HIGH (ref 4.8–5.6)
Mean Plasma Glucose: 168.55 mg/dL

## 2018-08-31 MED ORDER — INSULIN PUMP
Freq: Three times a day (TID) | SUBCUTANEOUS | Status: DC
  Administered 2018-08-31 – 2018-09-01 (×2): via SUBCUTANEOUS
  Administered 2018-09-01: 0.5 via SUBCUTANEOUS
  Administered 2018-09-02: 2 via SUBCUTANEOUS
  Administered 2018-09-02: 0.5 via SUBCUTANEOUS
  Administered 2018-09-02: 3 via SUBCUTANEOUS
  Administered 2018-09-03: 4.5 via SUBCUTANEOUS
  Administered 2018-09-03 – 2018-09-04 (×3): via SUBCUTANEOUS
  Filled 2018-08-31: qty 1

## 2018-08-31 MED ORDER — SODIUM CHLORIDE 0.9 % IV SOLN
INTRAVENOUS | Status: DC
  Administered 2018-08-31 – 2018-09-01 (×3): via INTRAVENOUS

## 2018-08-31 MED ORDER — LORAZEPAM 2 MG/ML IJ SOLN
0.5000 mg | Freq: Four times a day (QID) | INTRAMUSCULAR | Status: DC | PRN
  Administered 2018-08-31 – 2018-09-01 (×3): 0.5 mg via INTRAVENOUS
  Filled 2018-08-31 (×3): qty 1

## 2018-08-31 MED ORDER — HYDRALAZINE HCL 20 MG/ML IJ SOLN
10.0000 mg | Freq: Four times a day (QID) | INTRAMUSCULAR | Status: DC | PRN
  Administered 2018-09-01: 10 mg via INTRAVENOUS
  Filled 2018-08-31: qty 1

## 2018-08-31 MED ORDER — POTASSIUM CHLORIDE CRYS ER 20 MEQ PO TBCR
40.0000 meq | EXTENDED_RELEASE_TABLET | Freq: Two times a day (BID) | ORAL | Status: AC
  Administered 2018-08-31 (×2): 40 meq via ORAL
  Filled 2018-08-31 (×2): qty 2

## 2018-08-31 MED ORDER — LORAZEPAM 2 MG/ML IJ SOLN
0.2500 mg | Freq: Once | INTRAMUSCULAR | Status: AC
  Administered 2018-08-31: 0.25 mg via INTRAVENOUS
  Filled 2018-08-31: qty 1

## 2018-08-31 MED ORDER — PANTOPRAZOLE SODIUM 40 MG PO PACK
40.0000 mg | PACK | Freq: Two times a day (BID) | ORAL | Status: DC
  Administered 2018-08-31: 40 mg
  Filled 2018-08-31 (×2): qty 20

## 2018-08-31 MED ORDER — DEXTROSE 50 % IV SOLN
INTRAVENOUS | Status: AC
  Filled 2018-08-31: qty 50

## 2018-08-31 MED ORDER — DEXTROSE 50 % IV SOLN
INTRAVENOUS | Status: AC
  Administered 2018-08-31: 50 mL
  Filled 2018-08-31: qty 50

## 2018-08-31 MED ORDER — AMLODIPINE BESYLATE 10 MG PO TABS
10.0000 mg | ORAL_TABLET | Freq: Every day | ORAL | Status: DC
  Administered 2018-08-31 – 2018-09-04 (×5): 10 mg via ORAL
  Filled 2018-08-31 (×5): qty 1

## 2018-08-31 MED ORDER — DEXTROSE 50 % IV SOLN
50.0000 mL | Freq: Once | INTRAVENOUS | Status: AC
  Administered 2018-08-31: 50 mL via INTRAVENOUS

## 2018-08-31 NOTE — Progress Notes (Signed)
Inpatient Diabetes Program Recommendations  AACE/ADA: New Consensus Statement on Inpatient Glycemic Control (2015)  Target Ranges:  Prepandial:   less than 140 mg/dL      Peak postprandial:   less than 180 mg/dL (1-2 hours)      Critically ill patients:  140 - 180 mg/dL   Lab Results  Component Value Date   GLUCAP 51 (L) 08/31/2018   HGBA1C 7.5 (H) 08/31/2018    Review of Glycemic Control  Diabetes history: DM1 Outpatient Diabetes medications: insulin pump (settings below) Current orders for Inpatient glycemic control: Insulin pump  HgbA1C - 7.5% - Has been on insulin pump since for 2 months. Pt states much improved glycemic control with pump.  Insulin pump settings: Basal - 18 units (.75/hour) Bolus - CF 15 CHO ratio 1:10 Goal - 100-150 mg/dL  Inpatient Diabetes Program Recommendations:     Continue with insulin pump.  Discussed above with RN. Insulin pump flowsheet and assessment each shift. Pt nauseated at present. No po intake today.  Will follow for glucose control.  Thank you. Lorenda Peck, RD, LDN, CDE Inpatient Diabetes Coordinator [phone]

## 2018-08-31 NOTE — Progress Notes (Signed)
[facility] PROGRESS NOTE    Progress Note  BRYCELYN [name]  VFI:[id number] DOB: [birth year] DOA: 08/29/2018 PCP: Foye Spurling, MD     Brief Narrative:   Kimberly [name] is an 28 y.o. female past medical history of essential hypertension, diabetes mellitus type 2 on insulin pump woke up the day of admission with nausea vomiting epigastric abdominal pain  Assessment/Plan:   Intractable nausea and vomiting possible due to gastroparesis vs DKA: Never seen a gastroenterologist in the past.  Has been told by physicians he has gastroparesis but has never had a gastric emptying study. Will try to avoid narcotics. Agree with , Zofran. Continue carb modified diet she is tolerating it.  Mild DKA type 1 diabetes mellitus with diabetic nephropathy ([place]) Dark her back on her insulin pump. Discontinue D5W start her on normal saline. Continue long-acting insulin and sliding scale.  Microcytic anemia Follow-up with PCP as an outpatient likely iron deficient menstruating fertile female.  Essential hypertension: Ongoing vomiting will elevate your blood pressure Continue home dose lisinopril metoprolol. Use hydralazine IV PRN for elevated blood pressure  Acute kidney injury: Likely due to high blood glucose causing polyuria.  IV fluids to normal saline, recheck a basic metabolic panel in the morning. Continue NSAIDs.  Palpitations: Check a 12-lead EKG transfer to telemetry.  DVT prophylaxis: lovenox Family Communication:none Disposition Plan/Barrier to D/C: once DKA resolved, transferred to telemetry. Code Status:     Code Status Orders  (From admission, onward)         Start     Ordered   08/29/18 1706  Full code  Continuous     08/29/18 1705        Code Status History    Date Active Date Inactive Code Status Order ID Comments User Context   03/19/2018 2314 03/23/2018 1709 Full Code [id number]  Etta Quill, DO ED   03/17/2015 2043 03/22/2015 1533 Full Code  [id number]  Allyne Gee, MD Inpatient   10/23/2013 2142 10/25/2013 1723 Full Code [id number]  Kassie Mends, MD Inpatient   10/21/2013 1340 10/23/2013 2142 Full Code [id number]  Lorene Dy, CNM Inpatient   10/21/2013 1225 10/21/2013 1340 Full Code [id number]  Lorene Dy, CNM Inpatient        IV Access:    Peripheral IV   Procedures and diagnostic studies:   Dg Abd Acute W/chest  Result Date: 08/29/2018 CLINICAL DATA:  Abdomen pain for 2 days. EXAM: DG ABDOMEN ACUTE W/ 1V CHEST COMPARISON:  Chest x-ray March 19, 2018 FINDINGS: There is no evidence of dilated bowel loops or free intraperitoneal air. Bowel content is identified throughout colon. Pelvic phleboliths are noted. Heart size and mediastinal contours are within normal limits. Both lungs are clear. IMPRESSION: No bowel obstruction. Bowel content is identified throughout: Suggesting constipation. No acute cardiopulmonary disease. Electronically Signed   By: Abelardo Diesel M.D.   On: 08/29/2018 14:39     Medical Consultants:    None.  Anti-Infectives:   None  Subjective:    [name] she relates she is having palpitations, and ongoing headaches.  Objective:    Vitals:   08/30/18 2013 08/30/18 2040 08/30/18 2042 08/31/18 0519  BP: (!) 163/107 (!) 168/101 (!) 170/119 (!) 184/110  Pulse: (!) 102 (!) 104 (!) 102 (!) 103  Resp: (!) 22  (!) 22 18  Temp: 98.3 F (36.8 C)   98.6 F (37 C)  TempSrc:    Oral  SpO2: 100%  100%  100%  Weight:      Height:        Intake/Output Summary (Last 24 hours) at 08/31/2018 1019 Last data filed at 08/31/2018 0957 Gross per 24 hour  Intake 2397.59 ml  Output 1550 ml  Net 847.59 ml   Filed Weights   08/29/18 1207 08/29/18 1707  Weight: 70.8 kg 64.1 kg    Exam: General exam: In no acute distress, tired appearing Respiratory system: Good air movement and clear to auscultation Cardiovascular system: S1 & S2 heard, RRR.  Gastrointestinal system: Bowel sounds soft  nontender nondistended Central nervous system: Alert and oriented. No focal neurological deficits. Extremities: No pedal edema. Skin: No rashes or edema Psychiatry: Judgement and insight appear normal. Mood & affect appropriate.    Data Reviewed:    Labs: Basic Metabolic Panel: Recent Labs  Lab 08/30/18 1538 08/30/18 2002 08/31/18 0004 08/31/18 0518 08/31/18 0927  NA 149* 144 142 138 140  K 3.6 3.6 3.5 3.6 3.1*  CL 116* 113* 113* 107 107  CO2 19* 18* 19* 16* 18*  GLUCOSE 93 150* 234* 339* 390*  BUN 20 19 18 19  23*  CREATININE 1.22* 1.28* 1.17* 1.24* 1.50*  CALCIUM 9.0 9.0 8.5* 8.3* 8.9   GFR Estimated Creatinine Clearance: 49.8 mL/min (A) (by C-G formula based on SCr of 1.5 mg/dL (H)). Liver Function Tests: Recent Labs  Lab 08/29/18 1238  AST 15  ALT 10  ALKPHOS 59  BILITOT 0.7  PROT 7.2  ALBUMIN 3.9   Recent Labs  Lab 08/29/18 1238  LIPASE 25   No results for input(s): AMMONIA in the last 168 hours. Coagulation profile No results for input(s): INR, PROTIME in the last 168 hours.  CBC: Recent Labs  Lab 08/29/18 1238 08/30/18 0555  WBC 12.8* 13.5*  NEUTROABS 11.7*  --   HGB 11.5* 12.5  HCT 34.2* 38.3  MCV 75.0* 77.4*  PLT 281 309   Cardiac Enzymes: No results for input(s): CKTOTAL, CKMB, CKMBINDEX, TROPONINI in the last 168 hours. BNP (last 3 results) No results for input(s): PROBNP in the last 8760 hours. CBG: Recent Labs  Lab 08/30/18 1749 08/30/18 2008 08/31/18 0003 08/31/18 0306 08/31/18 0734  GLUCAP 100* 141* 210* 210* 425*   D-Dimer: No results for input(s): DDIMER in the last 72 hours. Hgb A1c: No results for input(s): HGBA1C in the last 72 hours. Lipid Profile: No results for input(s): CHOL, HDL, LDLCALC, TRIG, CHOLHDL, LDLDIRECT in the last 72 hours. Thyroid function studies: No results for input(s): TSH, T4TOTAL, T3FREE, THYROIDAB in the last 72 hours.  Invalid input(s): FREET3 Anemia work up: No results for input(s):  VITAMINB12, FOLATE, FERRITIN, TIBC, IRON, RETICCTPCT in the last 72 hours. Sepsis Labs: Recent Labs  Lab 08/29/18 1238 08/30/18 0555  WBC 12.8* 13.5*   Microbiology No results found for this or any previous visit (from the past 240 hour(s)).   Medications:   . enoxaparin (LOVENOX) injection  40 mg Subcutaneous Q24H  . insulin aspart  0-5 Units Subcutaneous QHS  . insulin aspart  0-9 Units Subcutaneous TID WC  . insulin detemir  10 Units Subcutaneous Daily  . insulin pump   Subcutaneous TID AC, HS, 0200  . lisinopril  20 mg Oral Daily  . metoprolol tartrate  50 mg Oral BID  . pantoprazole sodium  40 mg Oral Daily   Continuous Infusions:     LOS: 0 days   Charlynne Cousins  [facility]   *Please refer to [email or web address], password [email or web address] to  get updated schedule on who will round on this patient, as hospitalists switch teams weekly. If 7PM-7AM, please contact night-coverage at [email or web address], password [email or web address] for any overnight needs.  08/31/2018, 10:19 AM

## 2018-08-31 NOTE — Progress Notes (Addendum)
Patient c/o SOB, chest tightness, O2 sat 100% on RA. EKG done- NSR, finger stick glucose 51, pt. refusing PO's, IV dextrose given per hypoglycemic protocol, MD paged.   Pt. States she feels like she's having a panic attack. MD made aware.

## 2018-08-31 NOTE — Progress Notes (Signed)
Current blood sugar at 100. Patient refused lunch. She has agreed to drink some apple juice. She is aware of early S & S of hypoglycemia. Extra juice at bedside. Will monitor closely.

## 2018-09-01 DIAGNOSIS — E1010 Type 1 diabetes mellitus with ketoacidosis without coma: Secondary | ICD-10-CM | POA: Diagnosis not present

## 2018-09-01 DIAGNOSIS — E860 Dehydration: Secondary | ICD-10-CM

## 2018-09-01 DIAGNOSIS — R112 Nausea with vomiting, unspecified: Secondary | ICD-10-CM | POA: Diagnosis not present

## 2018-09-01 DIAGNOSIS — E1021 Type 1 diabetes mellitus with diabetic nephropathy: Secondary | ICD-10-CM | POA: Diagnosis not present

## 2018-09-01 DIAGNOSIS — I10 Essential (primary) hypertension: Secondary | ICD-10-CM | POA: Diagnosis not present

## 2018-09-01 LAB — BASIC METABOLIC PANEL
Anion gap: 11 (ref 5–15)
Anion gap: 9 (ref 5–15)
BUN: 16 mg/dL (ref 6–20)
BUN: 13 mg/dL (ref 6–20)
CO2: 19 mmol/L — ABNORMAL LOW (ref 22–32)
CO2: 22 mmol/L (ref 22–32)
CREATININE: 1.15 mg/dL — AB (ref 0.44–1.00)
Calcium: 8.9 mg/dL (ref 8.9–10.3)
Calcium: 8.6 mg/dL — ABNORMAL LOW (ref 8.9–10.3)
Chloride: 113 mmol/L — ABNORMAL HIGH (ref 98–111)
Chloride: 114 mmol/L — ABNORMAL HIGH (ref 98–111)
Creatinine, Ser: 1.12 mg/dL — ABNORMAL HIGH (ref 0.44–1.00)
GFR calc Af Amer: >60 mL/min (ref >60)
GFR calc Af Amer: >60 mL/min (ref >60)
GFR calc non Af Amer: >60 mL/min (ref >60)
GFR calc non Af Amer: >60 mL/min (ref >60)
GLUCOSE: 160 mg/dL — AB (ref 70–99)
Glucose, Bld: 56 mg/dL — ABNORMAL LOW (ref 70–99)
Potassium: 3.8 mmol/L (ref 3.5–5.1)
Potassium: 3.4 mmol/L — ABNORMAL LOW (ref 3.5–5.1)
Sodium: 143 mmol/L (ref 135–145)
Sodium: 145 mmol/L (ref 135–145)

## 2018-09-01 LAB — GLUCOSE, CAPILLARY
Glucose-Capillary: 97 mg/dL (ref 70–99)
Glucose-Capillary: 64 mg/dL — ABNORMAL LOW (ref 70–99)
Glucose-Capillary: 170 mg/dL — ABNORMAL HIGH (ref 70–99)
Glucose-Capillary: 184 mg/dL — ABNORMAL HIGH (ref 70–99)
Glucose-Capillary: 180 mg/dL — ABNORMAL HIGH (ref 70–99)
Glucose-Capillary: 196 mg/dL — ABNORMAL HIGH (ref 70–99)
Glucose-Capillary: 198 mg/dL — ABNORMAL HIGH (ref 70–99)
Glucose-Capillary: 152 mg/dL — ABNORMAL HIGH (ref 70–99)

## 2018-09-01 MED ORDER — FAMOTIDINE IN NACL 20-0.9 MG/50ML-% IV SOLN
20.0000 mg | Freq: Two times a day (BID) | INTRAVENOUS | Status: DC
  Administered 2018-09-01 (×2): 20 mg via INTRAVENOUS
  Filled 2018-09-01 (×3): qty 50

## 2018-09-01 MED ORDER — DEXTROSE 50 % IV SOLN
INTRAVENOUS | Status: AC
  Filled 2018-09-01: qty 50

## 2018-09-01 MED ORDER — POTASSIUM CHLORIDE 2 MEQ/ML IV SOLN
INTRAVENOUS | Status: DC
  Administered 2018-09-01: 17:00:00 via INTRAVENOUS
  Filled 2018-09-01 (×2): qty 1000

## 2018-09-01 MED ORDER — KCL IN DEXTROSE-NACL 40-5-0.45 MEQ/L-%-% IV SOLN
INTRAVENOUS | Status: DC
  Administered 2018-09-01: 23:00:00 via INTRAVENOUS
  Filled 2018-09-01: qty 1000

## 2018-09-01 MED ORDER — SODIUM CHLORIDE 0.9 % IV SOLN
250.0000 mg | Freq: Four times a day (QID) | INTRAVENOUS | Status: DC
  Administered 2018-09-01 – 2018-09-04 (×9): 250 mg via INTRAVENOUS
  Filled 2018-09-01 (×14): qty 5

## 2018-09-01 MED ORDER — DEXTROSE 50 % IV SOLN
50.0000 mL | Freq: Once | INTRAVENOUS | Status: AC
  Administered 2018-09-01: 50 mL via INTRAVENOUS

## 2018-09-01 MED ORDER — PANTOPRAZOLE SODIUM 40 MG PO PACK
40.0000 mg | PACK | Freq: Two times a day (BID) | ORAL | Status: DC
  Administered 2018-09-01 – 2018-09-04 (×7): 40 mg via ORAL
  Filled 2018-09-01 (×8): qty 20

## 2018-09-01 NOTE — Progress Notes (Signed)
Hypoglycemic Event  CBG: 44  Treatment: D50 50 mL (25 gm)  Symptoms: None  Follow-up CBG: Time: 23:31 CBG Result: 151  Possible Reasons for Event: Vomiting  Comments/MD notified: NP [place] notified via text page    Carolyn Stare Cherie

## 2018-09-01 NOTE — Progress Notes (Signed)
Hypoglycemic Event  CBG: 64  Treatment: D50 50 mL (25 gm)  Symptoms: None  Follow-up CBG: Time: CBG Result:  Possible Reasons for Event: Other: inadequate meal intake, N&V    Comments/MD notified: NP [place] notified via text page    Kimberly [name] Cherie

## 2018-09-01 NOTE — Progress Notes (Addendum)
[facility] PROGRESS NOTE    Progress Note  Kimberly [name]  ZOX:[id number] DOB: 23-Apr-1991 DOA: 08/29/2018 PCP: Foye Spurling, MD     Brief Narrative:   Kimberly [name] is an 28 y.o. female past medical history of essential hypertension, diabetes mellitus type 2 on insulin pump woke up the day of admission with nausea vomiting epigastric abdominal pain  Assessment/Plan:   Intractable nausea and vomiting possible due to gastroparesis vs DKA: Never seen a gastroenterologist in the past.  Has been told by physicians he has gastroparesis but has never had a gastric emptying study. Awaiting gastric emptying study. Will try to avoid narcotics. She has not tolerating a clear liquid diet. If emptying study could not be done today we will start her on IV Reglan.  Mild DKA type 1 diabetes mellitus with diabetic nephropathy ([place]) Blood glucose control and her insulin pump. Continue normal saline.  Microcytic anemia Follow-up with PCP as an outpatient likely iron deficient menstruating fertile female.  Essential hypertension: Ongoing vomiting will elevate your blood pressure Continue home dose lisinopril metoprolol. Use hydralazine IV PRN for elevated blood pressure  Acute kidney injury: Likely due to hypovolemia, improved with IV fluid hydration continue normal saline as as she has minimal oral intake. Continue NSAIDs.  Palpitations: Check a 12-lead EKG transfer to telemetry.  DVT prophylaxis: lovenox Family Communication:none Disposition Plan/Barrier to D/C: Once she is able to tolerate orals. Code Status:     Code Status Orders  (From admission, onward)         Start     Ordered   08/29/18 1706  Full code  Continuous     08/29/18 1705        Code Status History    Date Active Date Inactive Code Status Order ID Comments User Context   03/19/2018 2314 03/23/2018 1709 Full Code [id number]  Etta Quill, DO ED   03/17/2015 2043 03/22/2015 1533 Full Code  [id number]  Allyne Gee, MD Inpatient   10/23/2013 2142 10/25/2013 1723 Full Code [id number]  Kassie Mends, MD Inpatient   10/21/2013 1340 10/23/2013 2142 Full Code [id number]  Lorene Dy, CNM Inpatient   10/21/2013 1225 10/21/2013 1340 Full Code [id number]  Lorene Dy, CNM Inpatient        IV Access:    Peripheral IV   Procedures and diagnostic studies:   No results found.   Medical Consultants:    None.  Anti-Infectives:   None  Subjective:    Terasa N Record no further palpitations headaches have resolved.  She relates she continues to throw up does not want anything to eat.  Objective:    Vitals:   08/31/18 1208 08/31/18 1408 08/31/18 2113 09/01/18 0449  BP: (!) 140/98 (!) 157/99 (!) 151/97 (!) 166/104  Pulse: 94 92 99 92  Resp: 18 20 18 18   Temp: 99.4 F (37.4 C) 98.4 F (36.9 C) 98.8 F (37.1 C) 99.4 F (37.4 C)  TempSrc: Oral Oral Oral Oral  SpO2: 100% 100% 100% 100%  Weight:      Height:        Intake/Output Summary (Last 24 hours) at 09/01/2018 0740 Last data filed at 09/01/2018 0700 Gross per 24 hour  Intake 2326.02 ml  Output 500 ml  Net 1826.02 ml   Filed Weights   08/29/18 1207 08/29/18 1707 08/31/18 1208  Weight: 70.8 kg 64.1 kg 67.2 kg    Exam: General exam: In no acute distress, tired appearing Respiratory system:  Good air movement and clear to auscultation Cardiovascular system: S1 & S2 heard, RRR.  Gastrointestinal system: Bowel sounds soft nontender nondistended Central nervous system: Alert and oriented. No focal neurological deficits. Extremities: No pedal edema. Skin: No rashes or edema Psychiatry: Judgement and insight appear normal. Mood & affect appropriate.    Data Reviewed:    Labs: Basic Metabolic Panel: Recent Labs  Lab 08/31/18 0518 08/31/18 0927 08/31/18 1537 08/31/18 1800 08/31/18 2249  NA 138 140 142 146* 143  K 3.6 3.1* 3.6 3.7 3.8  CL 107 107 108 111 113*  CO2 16* 18* 22 22 19*  GLUCOSE 339*  390* 170* 112* 160*  BUN 19 23* 19 18 16   CREATININE 1.24* 1.50* 1.24* 1.11* 1.15*  CALCIUM 8.3* 8.9 8.6* 8.5* 8.9   GFR Estimated Creatinine Clearance: 65 mL/min (A) (by C-G formula based on SCr of 1.15 mg/dL (H)). Liver Function Tests: Recent Labs  Lab 08/29/18 1238  AST 15  ALT 10  ALKPHOS 59  BILITOT 0.7  PROT 7.2  ALBUMIN 3.9   Recent Labs  Lab 08/29/18 1238  LIPASE 25   No results for input(s): AMMONIA in the last 168 hours. Coagulation profile No results for input(s): INR, PROTIME in the last 168 hours.  CBC: Recent Labs  Lab 08/29/18 1238 08/30/18 0555  WBC 12.8* 13.5*  NEUTROABS 11.7*  --   HGB 11.5* 12.5  HCT 34.2* 38.3  MCV 75.0* 77.4*  PLT 281 309   Cardiac Enzymes: No results for input(s): CKTOTAL, CKMB, CKMBINDEX, TROPONINI in the last 168 hours. BNP (last 3 results) No results for input(s): PROBNP in the last 8760 hours. CBG: Recent Labs  Lab 08/31/18 1759 08/31/18 2155 08/31/18 2331 09/01/18 0242 09/01/18 0614  GLUCAP 109* 44* 151* 97 64*   D-Dimer: No results for input(s): DDIMER in the last 72 hours. Hgb A1c: Recent Labs    08/31/18 1052  HGBA1C 7.5*   Lipid Profile: No results for input(s): CHOL, HDL, LDLCALC, TRIG, CHOLHDL, LDLDIRECT in the last 72 hours. Thyroid function studies: No results for input(s): TSH, T4TOTAL, T3FREE, THYROIDAB in the last 72 hours.  Invalid input(s): FREET3 Anemia work up: No results for input(s): VITAMINB12, FOLATE, FERRITIN, TIBC, IRON, RETICCTPCT in the last 72 hours. Sepsis Labs: Recent Labs  Lab 08/29/18 1238 08/30/18 0555  WBC 12.8* 13.5*   Microbiology No results found for this or any previous visit (from the past 240 hour(s)).   Medications:   . dextrose      . dextrose      . amLODipine  10 mg Oral Daily  . enoxaparin (LOVENOX) injection  40 mg Subcutaneous Q24H  . insulin pump   Subcutaneous TID AC, HS, 0200  . metoprolol tartrate  50 mg Oral BID  . pantoprazole sodium  40  mg Oral BID   Continuous Infusions: . sodium chloride 100 mL/hr at 09/01/18 0607      LOS: 0 days   Charlynne Cousins  [facility]   *Please refer to [email or web address], password [email or web address] to get updated schedule on who will round on this patient, as hospitalists switch teams weekly. If 7PM-7AM, please contact night-coverage at [email or web address], password [email or web address] for any overnight needs.  09/01/2018, 7:40 AM

## 2018-09-01 NOTE — Progress Notes (Signed)
Patient removed telemetry for shower this afternoon.  Patient states she does not want to put telemetry monitor back on.  Dr. Olevia Bowens notified via text page.

## 2018-09-01 NOTE — Consult Note (Signed)
Consultation  Referring Provider: Dr. Olevia Bowens    Primary Care Physician:  Foye Spurling, MD Primary Gastroenterologist: Althia Forts        Reason for Consultation: Nausea and vomiting             HPI:   Kimberly [name] is a 28 y.o. female with a past medical history as listed below including diabetes type 1, who presented to the ER 08/29/2018 with vomiting and hyperglycemia.  We are consulted in regards to ongoing nausea regardless of acid suppression.     Today, the patient explains that she has had issues with nausea and vomiting as well as epigastric pain for over a year now.  Apparently her PCP has discussed gastroparesis but "they have never done any testing".  This has worsened until her current presentation to the hospital.  Currently patient is unable to tolerate anything by mouth even water or she "vomits it back up".  Patient wants to have testing done and figure out what else we can do.  She was tried on Reglan for period of time but it sounds as though she started having some tardive dyskinesia and this was discontinued.  No bowel movement in the past 2 days.    Denies fever, chills, hematemesis, heartburn or reflux.  ED course: White blood cell count 12.8, hemoglobin 11.5, glucose 270 initially, increased to 368 while in the ER, anion gap normal, bicarb 17, lipase normal  Past Medical History:  Diagnosis Date  . Diabetes mellitus without complication ([place])   . Preeclampsia   . Preeclampsia without severe features   . Sickle cell trait [facility])     Past Surgical History:  Procedure Laterality Date  . WISDOM TOOTH EXTRACTION      Family History  Problem Relation Age of Onset  . Lupus Mother   . Aneurysm Mother     Social History   Tobacco Use  . Smoking status: Never Smoker  . Smokeless tobacco: Never Used  Substance Use Topics  . Alcohol use: No  . Drug use: No    Prior to Admission medications   Medication Sig Start Date End Date Taking? Authorizing  Provider  etonogestrel (NEXPLANON) 68 MG IMPL implant Inject 1 each into the skin once.   Yes [provider]  famotidine (PEPCID) 20 MG tablet Take 1 tablet (20 mg total) by mouth 2 (two) times daily. 03/23/18  Yes Patrecia Pour, Christean Grief, MD  Insulin Human (INSULIN PUMP) SOLN Inject into the skin. Humalog   Yes [provider]  insulin lispro (HUMALOG) 100 UNIT/ML injection Inject into the skin 3 (three) times daily before meals. Via insulin pump   Yes [provider]  lisinopril (PRINIVIL,ZESTRIL) 20 MG tablet Take 1 tablet (20 mg total) by mouth daily. 03/24/18  Yes Patrecia Pour, Christean Grief, MD  metoprolol tartrate (LOPRESSOR) 50 MG tablet Take 1 tablet (50 mg total) by mouth 2 (two) times daily. 03/23/18  Yes Patrecia Pour, Christean Grief, MD  ondansetron (ZOFRAN) 4 MG tablet Take 1 tablet (4 mg total) by mouth every 6 (six) hours. 03/19/18  Yes Hayden Rasmussen, MD  BAYER CONTOUR NEXT TEST test strip USE AS DIRECTED TO TEST 6 TO 8 TIMES DAILY 10/01/14   Woodroe Mode, MD  Blood Glucose Monitoring Suppl (CONTOUR NEXT EZ MONITOR) W/DEVICE KIT 1 each by Does not apply route 6 (six) times daily. Provide lancets for testing six times daily. Type I DM with diabetes on Insulin Pump  DX 648.03  06/17/13   Woodroe Mode, MD    Current Facility-Administered Medications  Medication Dose Route Frequency Provider Last Rate Last Dose  . 0.9 %  sodium chloride infusion   Intravenous Continuous Charlynne Cousins, MD 75 mL/hr at 09/01/18 0744    . acetaminophen (TYLENOL) tablet 650 mg  650 mg Oral Q6H PRN Charlynne Cousins, MD   650 mg at 08/30/18 1110   Or  . acetaminophen (TYLENOL) suppository 650 mg  650 mg Rectal Q6H PRN Charlynne Cousins, MD      . amLODipine (NORVASC) tablet 10 mg  10 mg Oral Daily Charlynne Cousins, MD   10 mg at 09/01/18 1035  . dextrose 50 % solution           . enoxaparin (LOVENOX) injection 40 mg  40 mg Subcutaneous Q24H Charlynne Cousins, MD   40 mg at  08/31/18 1809  . famotidine (PEPCID) IVPB 20 mg premix  20 mg Intravenous Q12H Charlynne Cousins, MD      . hydrALAZINE (APRESOLINE) injection 10 mg  10 mg Intravenous Q6H PRN Charlynne Cousins, MD   10 mg at 09/01/18 0606  . insulin pump   Subcutaneous TID AC, HS, 0200 Charlynne Cousins, MD      . LORazepam (ATIVAN) injection 0.5 mg  0.5 mg Intravenous Q6H PRN Charlynne Cousins, MD   0.5 mg at 09/01/18 0343  . metoprolol tartrate (LOPRESSOR) tablet 50 mg  50 mg Oral BID Charlynne Cousins, MD   50 mg at 09/01/18 1035  . ondansetron (ZOFRAN) tablet 4 mg  4 mg Oral Q6H PRN Charlynne Cousins, MD       Or  . ondansetron [facility]) injection 4 mg  4 mg Intravenous Q6H PRN Charlynne Cousins, MD   4 mg at 08/31/18 2233  . pantoprazole sodium (PROTONIX) 40 mg/20 mL oral suspension 40 mg  40 mg Oral BID Charlynne Cousins, MD   40 mg at 09/01/18 1036  . promethazine (PHENERGAN) tablet 25 mg  25 mg Oral Q6H PRN Charlynne Cousins, MD       Or  . promethazine (PHENERGAN) injection 12.5 mg  12.5 mg Intravenous Q6H PRN Charlynne Cousins, MD   12.5 mg at 09/01/18 7793   Or  . promethazine (PHENERGAN) suppository 25 mg  25 mg Rectal Q6H PRN Charlynne Cousins, MD      . senna-docusate (Senokot-S) tablet 1 tablet  1 tablet Oral QHS PRN Charlynne Cousins, MD   1 tablet at 08/31/18 2222    Allergies as of 08/29/2018  . (No Known Allergies)     Review of Systems:    Constitutional: No weight loss, fever or chills Skin: No rash  Cardiovascular: No chest pain Respiratory: No SOB  Gastrointestinal: See HPI and otherwise negative Genitourinary: No dysuria  Neurological: No headache, dizziness or syncope Musculoskeletal: No new muscle or joint pain Hematologic: No bleeding  Psychiatric: No history of depression or anxiety    Physical Exam:  Vital signs in last 24 hours: Temp:  [98.4 F (36.9 C)-99.4 F (37.4 C)] 99.4 F (37.4 C) (01/04 0449) Pulse Rate:  [89-99] 96  (01/04 0818) Resp:  [18-20] 18 (01/04 0449) BP: (116-166)/(71-104) 116/71 (01/04 0818) SpO2:  [100 %] 100 % (01/04 0818) Weight:  [67.2 kg] 67.2 kg (01/03 1208) Last BM Date: 08/29/18 General:   Pleasant AA female appears to be in NAD, Well developed, Well nourished, alert and cooperative Head:  Normocephalic and atraumatic. Eyes:   PEERL, EOMI. No icterus. Conjunctiva pink. Ears:  Normal auditory acuity. Neck:  Supple Throat: Oral cavity and pharynx without inflammation, swelling or lesion.  Lungs: Respirations even and unlabored. Lungs clear to auscultation bilaterally.   No wheezes, crackles, or rhonchi.  Heart: Normal S1, S2. No MRG. Regular rate and rhythm. No peripheral edema, cyanosis or pallor.  Abdomen:  Soft, mild epigastric ttp, non-distended. No rebound or guarding. Normal bowel sounds. No appreciable masses or hepatomegaly. Rectal:  Not performed.  Msk:  Symmetrical without gross deformities. Peripheral pulses intact.  Extremities:  Without edema, no deformity or joint abnormality. Normal ROM, normal sensation. Neurologic:  Alert and  oriented x4;  grossly normal neurologically.   Skin:   Dry and intact without significant lesions or rashes. Psychiatric: Demonstrates good judgement and reason without abnormal affect or behaviors.   LAB RESULTS: Recent Labs    08/29/18 1238 08/30/18 0555  WBC 12.8* 13.5*  HGB 11.5* 12.5  HCT 34.2* 38.3  PLT 281 309   BMET Recent Labs    08/31/18 1800 08/31/18 2249 09/01/18 0513  NA 146* 143 145  K 3.7 3.8 3.4*  CL 111 113* 114*  CO2 22 19* 22  GLUCOSE 112* 160* 56*  BUN 18 16 13   CREATININE 1.11* 1.15* 1.12*  CALCIUM 8.5* 8.9 8.6*   LFT Recent Labs    08/29/18 1238  PROT 7.2  ALBUMIN 3.9  AST 15  ALT 10  ALKPHOS 59  BILITOT 0.7    Impression / Plan:   Impression: 1.  Intractable nausea and vomiting: Worse over the past year per the patient, no change with acid suppression in the hospital, previously tried on  Reglan developed what sounds like tardive dyskinesia and this was stopped, diabetes is uncontrolled; most likely gastroparesis 2.  Macrocytic anemia 3.  AKI  Plan: 1.  We will order gastric emptying study for further evaluation 2.  We will need to hold antiemetics for at least 4 hours prior to imaging above 3.   Please await any further recommendations from Dr. Bryan Lemma later today.  Thank you for your kind consultation, we will continue to follow.  Lavone Nian Naji Mehringer  09/01/2018, 11:03 AM

## 2018-09-01 NOTE — Progress Notes (Signed)
Patient has continued with nausea and vomiting overnight. She is unable to keep liquids down and therefore not eating or drinking. Patient's blood sugar dropped 2 times overnight. It was 44 at 21:55 and 64 at 6:14. Patient encouraged to let nursing staff know if insulin pump promts her that blood sugar is trending low. Patient's BP was elevated this morning at 166/104. NP on call was notified. Gave patient prn Hydralazine. Will continue to monitor patient.

## 2018-09-02 DIAGNOSIS — I160 Hypertensive urgency: Secondary | ICD-10-CM | POA: Diagnosis not present

## 2018-09-02 DIAGNOSIS — Z79899 Other long term (current) drug therapy: Secondary | ICD-10-CM | POA: Diagnosis not present

## 2018-09-02 DIAGNOSIS — D573 Sickle-cell trait: Secondary | ICD-10-CM | POA: Diagnosis not present

## 2018-09-02 DIAGNOSIS — E861 Hypovolemia: Secondary | ICD-10-CM | POA: Diagnosis not present

## 2018-09-02 DIAGNOSIS — K30 Functional dyspepsia: Secondary | ICD-10-CM | POA: Diagnosis present

## 2018-09-02 DIAGNOSIS — D539 Nutritional anemia, unspecified: Secondary | ICD-10-CM | POA: Diagnosis present

## 2018-09-02 DIAGNOSIS — N179 Acute kidney failure, unspecified: Secondary | ICD-10-CM | POA: Diagnosis not present

## 2018-09-02 DIAGNOSIS — D509 Iron deficiency anemia, unspecified: Secondary | ICD-10-CM | POA: Diagnosis not present

## 2018-09-02 DIAGNOSIS — R112 Nausea with vomiting, unspecified: Secondary | ICD-10-CM | POA: Diagnosis not present

## 2018-09-02 DIAGNOSIS — E1021 Type 1 diabetes mellitus with diabetic nephropathy: Secondary | ICD-10-CM | POA: Diagnosis not present

## 2018-09-02 DIAGNOSIS — E1043 Type 1 diabetes mellitus with diabetic autonomic (poly)neuropathy: Secondary | ICD-10-CM | POA: Diagnosis not present

## 2018-09-02 DIAGNOSIS — Z794 Long term (current) use of insulin: Secondary | ICD-10-CM | POA: Diagnosis not present

## 2018-09-02 DIAGNOSIS — K5900 Constipation, unspecified: Secondary | ICD-10-CM | POA: Diagnosis not present

## 2018-09-02 DIAGNOSIS — E1010 Type 1 diabetes mellitus with ketoacidosis without coma: Secondary | ICD-10-CM | POA: Diagnosis not present

## 2018-09-02 DIAGNOSIS — I10 Essential (primary) hypertension: Secondary | ICD-10-CM | POA: Diagnosis not present

## 2018-09-02 DIAGNOSIS — R002 Palpitations: Secondary | ICD-10-CM | POA: Diagnosis not present

## 2018-09-02 DIAGNOSIS — K3184 Gastroparesis: Secondary | ICD-10-CM | POA: Diagnosis not present

## 2018-09-02 DIAGNOSIS — E860 Dehydration: Secondary | ICD-10-CM | POA: Diagnosis present

## 2018-09-02 LAB — BASIC METABOLIC PANEL
Anion gap: 10 (ref 5–15)
BUN: 10 mg/dL (ref 6–20)
CO2: 20 mmol/L — ABNORMAL LOW (ref 22–32)
CREATININE: 1.08 mg/dL — AB (ref 0.44–1.00)
Calcium: 8.4 mg/dL — ABNORMAL LOW (ref 8.9–10.3)
Chloride: 112 mmol/L — ABNORMAL HIGH (ref 98–111)
GFR calc Af Amer: >60 mL/min (ref >60)
GFR calc non Af Amer: >60 mL/min (ref >60)
Glucose, Bld: 172 mg/dL — ABNORMAL HIGH (ref 70–99)
Potassium: 3.5 mmol/L (ref 3.5–5.1)
Sodium: 142 mmol/L (ref 135–145)

## 2018-09-02 LAB — GLUCOSE, CAPILLARY
Glucose-Capillary: 115 mg/dL — ABNORMAL HIGH (ref 70–99)
Glucose-Capillary: 154 mg/dL — ABNORMAL HIGH (ref 70–99)
Glucose-Capillary: 176 mg/dL — ABNORMAL HIGH (ref 70–99)
Glucose-Capillary: 72 mg/dL (ref 70–99)
Glucose-Capillary: 116 mg/dL — ABNORMAL HIGH (ref 70–99)

## 2018-09-02 MED ORDER — ONDANSETRON 4 MG PO TBDP: 8.0000 mg | ORAL_TABLET | Freq: Three times a day (TID) | ORAL | Status: DC | PRN

## 2018-09-02 MED ORDER — CLONIDINE HCL 0.1 MG PO TABS
0.1000 mg | ORAL_TABLET | Freq: Once | ORAL | Status: AC
  Administered 2018-09-02: 0.1 mg via ORAL
  Filled 2018-09-02: qty 1

## 2018-09-02 MED ORDER — CLONIDINE HCL 0.3 MG PO TABS
0.3000 mg | ORAL_TABLET | Freq: Once | ORAL | Status: AC
  Administered 2018-09-02: 0.3 mg via ORAL
  Filled 2018-09-02: qty 1

## 2018-09-02 NOTE — Plan of Care (Signed)
  Problem: Health Behavior/Discharge Planning: Goal: Ability to manage health-related needs will improve Outcome: Progressing   Problem: Clinical Measurements: Goal: Ability to maintain clinical measurements within normal limits will improve Outcome: Progressing Goal: Will remain free from infection Outcome: Progressing Goal: Diagnostic test results will improve Outcome: Progressing Goal: Respiratory complications will improve Outcome: Progressing Goal: Cardiovascular complication will be avoided Outcome: Progressing   Problem: Nutrition: Goal: Adequate nutrition will be maintained Outcome: Progressing   Problem: Coping: Goal: Level of anxiety will decrease Outcome: Progressing   Problem: Pain Managment: Goal: General experience of comfort will improve Outcome: Progressing

## 2018-09-02 NOTE — Progress Notes (Addendum)
[facility] PROGRESS NOTE    Progress Note  Kimberly [name]  [id number]:[id number] DOB: 1990-12-04 DOA: 08/29/2018 PCP: Foye Spurling, MD     Brief Narrative:   Kimberly [name] is an 27 y.o. female past medical history of essential hypertension, diabetes mellitus type 2 on insulin pump woke up the day of admission with nausea vomiting epigastric abdominal pain  Assessment/Plan:   Intractable nausea and vomiting possible due to gastroparesis vs DKA: Never seen a gastroenterologist in the past.  Has been told by physicians he has GI was consulted recommended conservative management and IV erythromycin. [place] emptying study cannot be done till 09/03/2018, she was previously not tolerating any type of diet, she cannot take Reglan as this causes tardive dyskinesia. Slowly feeding her 10 cc for the past 48 hours which she has been tolerating it. She has been tolerating sips of 30 cc every hour. We will continue to increase to 10 cc every hour if she tolerates it. If she continues to tolerate small amounts of sips we will start her on a full liquid diet tomorrow. She refuses broth as she says is too salty.  Mild DKA type 1 diabetes mellitus with diabetic nephropathy ([place]) Blood glucose control and her insulin pump. Continue normal saline.  Microcytic anemia Follow-up with PCP as an outpatient likely iron deficient menstruating fertile female.  Essential hypertension: Ongoing vomiting will elevate your blood pressure Continue home dose lisinopril metoprolol. Use hydralazine IV PRN for elevated blood pressure  Acute kidney injury: Creatinine is back to normal likely prerenal azotemia. Her IV is will discontinue IV fluids. Does not want her IV to be placed back.  Palpitations: Check a 12-lead EKG transfer to telemetry.  DVT prophylaxis: lovenox Family Communication:none Disposition Plan/Barrier to D/C: Once she is able to tolerate orals. Code Status:     Code Status  Orders  (From admission, onward)         Start     Ordered   08/29/18 1706  Full code  Continuous     08/29/18 1705        Code Status History    Date Active Date Inactive Code Status Order ID Comments User Context   03/19/2018 2314 03/23/2018 1709 Full Code [id number]  Etta Quill, DO ED   03/17/2015 2043 03/22/2015 1533 Full Code [id number]  Allyne Gee, MD Inpatient   10/23/2013 2142 10/25/2013 1723 Full Code [id number]  Kassie Mends, MD Inpatient   10/21/2013 1340 10/23/2013 2142 Full Code [id number]  Lorene Dy, CNM Inpatient   10/21/2013 1225 10/21/2013 1340 Full Code [id number]  Lorene Dy, CNM Inpatient        IV Access:    Peripheral IV   Procedures and diagnostic studies:   No results found.   Medical Consultants:    None.  Anti-Infectives:   None  Subjective:    Kimberly [name] she relates she has been tolerating small amounts of feeds.  Objective:    Vitals:   09/01/18 0818 09/01/18 1451 09/01/18 2051 09/02/18 0500  BP: 116/71 (!) 152/92 132/79 (!) 175/92  Pulse: 96 93 99 91  Resp:  18    Temp:  99.7 F (37.6 C) 99 F (37.2 C) 99 F (37.2 C)  TempSrc:  Oral Oral Oral  SpO2: 100% 100% 100% 100%  Weight:      Height:        Intake/Output Summary (Last 24 hours) at 09/02/2018 1015 Last data filed at 09/02/2018 0500  Gross per 24 hour  Intake 1088.86 ml  Output -  Net 1088.86 ml   Filed Weights   08/29/18 1207 08/29/18 1707 08/31/18 1208  Weight: 70.8 kg 64.1 kg 67.2 kg    Exam: General exam: In no acute distress, tired appearing Respiratory system: Good air movement and clear to auscultation Cardiovascular system: S1 & S2 heard, RRR.  Gastrointestinal system: Bowel sounds soft nontender nondistended Central nervous system: Alert and oriented. No focal neurological deficits. Extremities: No pedal edema. Skin: No rashes or edema Psychiatry: Judgement and insight appear normal. Mood & affect appropriate.    Data Reviewed:     Labs: Basic Metabolic Panel: Recent Labs  Lab 08/31/18 1537 08/31/18 1800 08/31/18 2249 09/01/18 0513 09/02/18 0523  NA 142 146* 143 145 142  K 3.6 3.7 3.8 3.4* 3.5  CL 108 111 113* 114* 112*  CO2 22 22 19* 22 20*  GLUCOSE 170* 112* 160* 56* 172*  BUN 19 18 16 13 10   CREATININE 1.24* 1.11* 1.15* 1.12* 1.08*  CALCIUM 8.6* 8.5* 8.9 8.6* 8.4*   GFR Estimated Creatinine Clearance: 69.2 mL/min (A) (by C-G formula based on SCr of 1.08 mg/dL (H)). Liver Function Tests: Recent Labs  Lab 08/29/18 1238  AST 15  ALT 10  ALKPHOS 59  BILITOT 0.7  PROT 7.2  ALBUMIN 3.9   Recent Labs  Lab 08/29/18 1238  LIPASE 25   No results for input(s): AMMONIA in the last 168 hours. Coagulation profile No results for input(s): INR, PROTIME in the last 168 hours.  CBC: Recent Labs  Lab 08/29/18 1238 08/30/18 0555  WBC 12.8* 13.5*  NEUTROABS 11.7*  --   HGB 11.5* 12.5  HCT 34.2* 38.3  MCV 75.0* 77.4*  PLT 281 309   Cardiac Enzymes: No results for input(s): CKTOTAL, CKMB, CKMBINDEX, TROPONINI in the last 168 hours. BNP (last 3 results) No results for input(s): PROBNP in the last 8760 hours. CBG: Recent Labs  Lab 09/01/18 1625 09/01/18 1857 09/01/18 2053 09/02/18 0216 09/02/18 0742  GLUCAP 196* 198* 152* 115* 154*   D-Dimer: No results for input(s): DDIMER in the last 72 hours. Hgb A1c: Recent Labs    08/31/18 1052  HGBA1C 7.5*   Lipid Profile: No results for input(s): CHOL, HDL, LDLCALC, TRIG, CHOLHDL, LDLDIRECT in the last 72 hours. Thyroid function studies: No results for input(s): TSH, T4TOTAL, T3FREE, THYROIDAB in the last 72 hours.  Invalid input(s): FREET3 Anemia work up: No results for input(s): VITAMINB12, FOLATE, FERRITIN, TIBC, IRON, RETICCTPCT in the last 72 hours. Sepsis Labs: Recent Labs  Lab 08/29/18 1238 08/30/18 0555  WBC 12.8* 13.5*   Microbiology No results found for this or any previous visit (from the past 240  hour(s)).   Medications:   . amLODipine  10 mg Oral Daily  . enoxaparin (LOVENOX) injection  40 mg Subcutaneous Q24H  . insulin pump   Subcutaneous TID AC, HS, 0200  . metoprolol tartrate  50 mg Oral BID  . pantoprazole sodium  40 mg Oral BID   Continuous Infusions: . dextrose 5 % and 0.45 % NaCl with KCl 40 mEq/L Stopped (09/02/18 0118)  . erythromycin Stopped (09/02/18 0148)  . famotidine (PEPCID) IV Stopped (09/01/18 2316)      LOS: 0 days   Charlynne Cousins  [facility]   *Please refer to [email or web address], password [email or web address] to get updated schedule on who will round on this patient, as hospitalists switch teams weekly. If 7PM-7AM, please contact night-coverage at [email or web address], password  [email or web address] for any overnight needs.  09/02/2018, 10:15 AM

## 2018-09-02 NOTE — Progress Notes (Signed)
Pt complained of pain at IV site this shift. Upon assessment, pt arm was painful to touch and swollen. After removing IV and stopping fluids, IV team was consulted. IV team unable to achieve IV access. MD notified by text page and fluids on hold. Awaiting orders. Will continue to monitor.

## 2018-09-03 ENCOUNTER — Inpatient Hospital Stay (HOSPITAL_COMMUNITY): Payer: BLUE CROSS/BLUE SHIELD

## 2018-09-03 LAB — GLUCOSE, CAPILLARY
Glucose-Capillary: 125 mg/dL — ABNORMAL HIGH (ref 70–99)
Glucose-Capillary: 169 mg/dL — ABNORMAL HIGH (ref 70–99)
Glucose-Capillary: 293 mg/dL — ABNORMAL HIGH (ref 70–99)
Glucose-Capillary: 315 mg/dL — ABNORMAL HIGH (ref 70–99)
Glucose-Capillary: 76 mg/dL (ref 70–99)

## 2018-09-03 MED ORDER — TECHNETIUM TC 99M SULFUR COLLOID
2.0800 | Freq: Once | INTRAVENOUS | Status: AC | PRN
  Administered 2018-09-03: 2.08 via INTRAVENOUS

## 2018-09-03 NOTE — Progress Notes (Signed)
[facility] PROGRESS NOTE    Progress Note  Kimberly [name]  [id number]:[id number] DOB: [birth year] DOA: 08/29/2018 PCP: Foye Spurling, MD     Brief Narrative:   Kimberly [name] is an 28 y.o. female past medical history of essential hypertension, diabetes mellitus type 2 on insulin pump woke up the day of admission with nausea vomiting epigastric abdominal pain  Assessment/Plan:   Intractable nausea and vomiting possible due to gastroparesis vs DKA: On IV erythromycin tolerated her diet yesterday but still feels full. Gastric emptying study cannot be done till 09/03/2018, she was previously not tolerating any type of diet, she cannot take Reglan as this causes tardive dyskinesia. If she continues to tolerate small amounts of sips we will start her on a full liquid diet tomorrow. Appreciate GIs assistance.  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 Continue normal saline.  Microcytic anemia Follow-up with PCP as an outpatient likely iron deficient menstruating fertile female.  Essential hypertension: Blood pressure stable continue current home regimen.  Acute kidney injury: Creatinine is back to normal likely prerenal azotemia. Does not want her IV to be placed back.  Palpitations: Check a 12-lead EKG transfer to telemetry.  DVT prophylaxis: lovenox Family Communication:none Disposition Plan/Barrier to D/C: Once she is able to tolerate orals. Code Status:     Code Status Orders  (From admission, onward)         Start     Ordered   08/29/18 1706  Full code  Continuous     08/29/18 1705        Code Status History    Date Active Date Inactive Code Status Order ID Comments User Context   03/19/2018 2314 03/23/2018 1709 Full Code [id number]  Etta Quill, DO ED   03/17/2015 2043 03/22/2015 1533 Full Code [id number]  Allyne Gee, MD Inpatient   10/23/2013 2142 10/25/2013 1723 Full Code [id number]   Kassie Mends, MD Inpatient   10/21/2013 1340 10/23/2013 2142 Full Code [id number]  Lorene Dy, CNM Inpatient   10/21/2013 1225 10/21/2013 1340 Full Code [id number]  Lorene Dy, CNM Inpatient        IV Access:    Peripheral IV   Procedures and diagnostic studies:   No results found.   Medical Consultants:    None.  Anti-Infectives:   None  Subjective:    Kimberly [name] she had a very small amount of fluid yesterday. Did not throw up.  Objective:    Vitals:   09/02/18 1021 09/02/18 1333 09/02/18 2106 09/03/18 0457  BP: 127/88 94/61 108/75 103/65  Pulse: 81 76 73 78  Resp:  18 16 16   Temp:  98.6 F (37 C) 97.8 F (36.6 C) 98.9 F (37.2 C)  TempSrc:   Oral Oral  SpO2:  100% 100% 100%  Weight:      Height:        Intake/Output Summary (Last 24 hours) at 09/03/2018 1008 Last data filed at 09/03/2018 0200 Gross per 24 hour  Intake 1228.04 ml  Output -  Net 1228.04 ml   Filed Weights   08/29/18 1207 08/29/18 1707 08/31/18 1208  Weight: 70.8 kg 64.1 kg 67.2 kg    Exam: General exam: In no acute distress, tired appearing Respiratory system: Good air movement and clear to auscultation Cardiovascular system: S1 & S2 heard, RRR.  Gastrointestinal system: Bowel sounds soft nontender nondistended Central nervous system: Alert and oriented. No  focal neurological deficits. Extremities: No pedal edema. Skin: No rashes or edema Psychiatry: Judgement and insight appear normal. Mood & affect appropriate.    Data Reviewed:    Labs: Basic Metabolic Panel: Recent Labs  Lab 08/31/18 1537 08/31/18 1800 08/31/18 2249 09/01/18 0513 09/02/18 0523  NA 142 146* 143 145 142  K 3.6 3.7 3.8 3.4* 3.5  CL 108 111 113* 114* 112*  CO2 22 22 19* 22 20*  GLUCOSE 170* 112* 160* 56* 172*  BUN 19 18 16 13 10   CREATININE 1.24* 1.11* 1.15* 1.12* 1.08*  CALCIUM 8.6* 8.5* 8.9 8.6* 8.4*   GFR Estimated Creatinine Clearance: 69.2 mL/min (A) (by C-G formula based on  SCr of 1.08 mg/dL (H)). Liver Function Tests: Recent Labs  Lab 08/29/18 1238  AST 15  ALT 10  ALKPHOS 59  BILITOT 0.7  PROT 7.2  ALBUMIN 3.9   Recent Labs  Lab 08/29/18 1238  LIPASE 25   No results for input(s): AMMONIA in the last 168 hours. Coagulation profile No results for input(s): INR, PROTIME in the last 168 hours.  CBC: Recent Labs  Lab 08/29/18 1238 08/30/18 0555  WBC 12.8* 13.5*  NEUTROABS 11.7*  --   HGB 11.5* 12.5  HCT 34.2* 38.3  MCV 75.0* 77.4*  PLT 281 309   Cardiac Enzymes: No results for input(s): CKTOTAL, CKMB, CKMBINDEX, TROPONINI in the last 168 hours. BNP (last 3 results) No results for input(s): PROBNP in the last 8760 hours. CBG: Recent Labs  Lab 09/02/18 1637 09/02/18 2105 09/03/18 0036 09/03/18 0458 09/03/18 0743  GLUCAP 72 116* 76 169* 125*   D-Dimer: No results for input(s): DDIMER in the last 72 hours. Hgb A1c: Recent Labs    08/31/18 1052  HGBA1C 7.5*   Lipid Profile: No results for input(s): CHOL, HDL, LDLCALC, TRIG, CHOLHDL, LDLDIRECT in the last 72 hours. Thyroid function studies: No results for input(s): TSH, T4TOTAL, T3FREE, THYROIDAB in the last 72 hours.  Invalid input(s): FREET3 Anemia work up: No results for input(s): VITAMINB12, FOLATE, FERRITIN, TIBC, IRON, RETICCTPCT in the last 72 hours. Sepsis Labs: Recent Labs  Lab 08/29/18 1238 08/30/18 0555  WBC 12.8* 13.5*   Microbiology No results found for this or any previous visit (from the past 240 hour(s)).   Medications:   . amLODipine  10 mg Oral Daily  . enoxaparin (LOVENOX) injection  40 mg Subcutaneous Q24H  . insulin pump   Subcutaneous TID AC, HS, 0200  . metoprolol tartrate  50 mg Oral BID  . pantoprazole sodium  40 mg Oral BID   Continuous Infusions: . dextrose 5 % and 0.45 % NaCl with KCl 40 mEq/L Stopped (09/02/18 2351)  . erythromycin 250 mg (09/03/18 0541)      LOS: 1 day   Charlynne Cousins  [facility]   *Please  refer to [email or web address], password [email or web address] to get updated schedule on who will round on this patient, as hospitalists switch teams weekly. If 7PM-7AM, please contact night-coverage at [email or web address], password [email or web address] for any overnight needs.  09/03/2018, 10:08 AM

## 2018-09-03 NOTE — Progress Notes (Signed)
    Progress Note   Subjective  Chief Complaint: Nausea and vomiting  Patient reports that she is actually improved over the past 24 hours, tolerating small amounts of liquid/clear liquid diet.  Does report that when she eats anything she does feel full, but not nauseous since starting the Erythromycin.  Denies abdominal pain.  Reports a bowel movement yesterday which was small but that is somewhat normal for her.  Describes generally being somewhat constipated with a bowel movement every 2 to 3 days with the use of prune juice.   Objective   Vital signs in last 24 hours: Temp:  [97.8 F (36.6 C)-98.9 F (37.2 C)] 98.9 F (37.2 C) (01/06 0457) Pulse Rate:  [73-81] 78 (01/06 0457) Resp:  [16-18] 16 (01/06 0457) BP: (94-127)/(61-88) 103/65 (01/06 0457) SpO2:  [100 %] 100 % (01/06 0457) Last BM Date: 09/02/18(per pt) General:    AA female in NAD Heart:  Regular rate and rhythm; no murmurs Lungs: Respirations even and unlabored, lungs CTA bilaterally Abdomen:  Soft, nontender and nondistended. Normal bowel sounds. Extremities:  Without edema. Neurologic:  Alert and oriented,  grossly normal neurologically. Psych:  Cooperative. Normal mood and affect.  Intake/Output from previous day: 01/05 0701 - 01/06 0700 In: 1228 [P.O.:720; I.V.:58; IV Piggyback:400] Out: -    Lab Results: BMET Recent Labs    08/31/18 2249 09/01/18 0513 09/02/18 0523  NA 143 145 142  K 3.8 3.4* 3.5  CL 113* 114* 112*  CO2 19* 22 20*  GLUCOSE 160* 56* 172*  BUN 16 13 10   CREATININE 1.15* 1.12* 1.08*  CALCIUM 8.9 8.6* 8.4*    Assessment / Plan:   Assessment: 1.  Nausea and vomiting: Again likely this is due to gastroparesis, gastric emptying study ordered for today, the results may be somewhat skewed due to the fact the patient is on Erythromycin, did discuss this with her today 2.  Uncontrolled diabetes type 1  Plan: 1.  We will await results from gastric emptying study today. 2.  Patient remains  with full feeling stomach, could consider EGD outpatient for further evaluation 3.  Briefly discussed gastroparesis diet and recommendations if this is her true diagnosis, which likely it is. 4.  Continue current diet plan 5.  Please await any further recommendations from Dr. Tarri Glenn later today  Thank you for your kind consultation, we will continue to follow.   LOS: 1 day   Levin Erp  09/03/2018, 9:38 AM

## 2018-09-03 NOTE — Plan of Care (Signed)
  Problem: Health Behavior/Discharge Planning: Goal: Ability to manage health-related needs will improve Outcome: Progressing   Problem: Clinical Measurements: Goal: Ability to maintain clinical measurements within normal limits will improve Outcome: Progressing Goal: Will remain free from infection Outcome: Progressing Goal: Diagnostic test results will improve Outcome: Progressing Goal: Respiratory complications will improve Outcome: Progressing Goal: Cardiovascular complication will be avoided Outcome: Progressing   Problem: Nutrition: Goal: Adequate nutrition will be maintained Outcome: Progressing   Problem: Coping: Goal: Level of anxiety will decrease Outcome: Progressing   Problem: Elimination: Goal: Will not experience complications related to bowel motility Outcome: Progressing Goal: Will not experience complications related to urinary retention Outcome: Progressing   Problem: Pain Managment: Goal: General experience of comfort will improve Outcome: Progressing

## 2018-09-04 LAB — GLUCOSE, CAPILLARY
Glucose-Capillary: 140 mg/dL — ABNORMAL HIGH (ref 70–99)
Glucose-Capillary: 212 mg/dL — ABNORMAL HIGH (ref 70–99)
Glucose-Capillary: 337 mg/dL — ABNORMAL HIGH (ref 70–99)
Glucose-Capillary: 36 mg/dL — CL (ref 70–99)
Glucose-Capillary: 87 mg/dL (ref 70–99)

## 2018-09-04 MED ORDER — ERYTHROMYCIN ETHYLSUCCINATE 200 MG/5ML PO SUSR: 50.0000 mg | Freq: Three times a day (TID) | ORAL | 0 refills | Status: AC

## 2018-09-04 NOTE — Progress Notes (Addendum)
    Progress Note   Subjective  Chief Complaint: Nausea and vomiting  This morning, patient explains that she has been able to tolerate a regular diet as of last night and today, overall she is having no further abdominal pain and no nausea.  She is happy with how much she is feeling better and denies any new complaints or concerns.   Objective   Vital signs in last 24 hours: Temp:  [98.9 F (37.2 C)-99 F (37.2 C)] 99 F (37.2 C) (01/07 0440) Pulse Rate:  [82-88] 82 (01/07 0440) Resp:  [20] 20 (01/06 1655) BP: (111-146)/(74-89) 113/74 (01/07 0440) SpO2:  [100 %] 100 % (01/07 0440) Last BM Date: 09/04/18 General:    AA female in NAD Heart:  Regular rate and rhythm; no murmurs Lungs: Respirations even and unlabored, lungs CTA bilaterally Abdomen:  Soft, nontender and nondistended. Normal bowel sounds. Extremities:  Without edema. Neurologic:  Alert and oriented,  grossly normal neurologically. Psych:  Cooperative. Normal mood and affect.  Intake/Output from previous day: 01/06 0701 - 01/07 0700 In: 695 [P.O.:340; I.V.:55; IV Piggyback:300] Out: -   Lab Results: BMET Recent Labs    09/02/18 0523  NA 142  K 3.5  CL 112*  CO2 20*  GLUCOSE 172*  BUN 10  CREATININE 1.08*  CALCIUM 8.4*   Studies/Results: Nm Gastric Emptying Solid Liquid Both W/sb Transit  Result Date: 09/03/2018 CLINICAL DATA:  Nausea, vomiting, early satiety, diabetes. EXAM: NUCLEAR MEDICINE GASTRIC EMPTYING SCAN TECHNIQUE: After oral ingestion of radiolabeled meal, sequential abdominal images were obtained for 4 hours. Percentage of activity emptying the stomach was calculated at 1 hour, 2 hour, 3 hour, and 4 hours. RADIOPHARMACEUTICALS:  2.1 mCi Tc-58m sulfur colloid in standardized meal COMPARISON:  CT abdomen 03/19/2018 FINDINGS: Expected location of the stomach in the left upper quadrant. Ingested meal empties the stomach gradually over the course of the study. 26.8% emptied at 1 hr ( normal >= 10%)  49.8% emptied at 2 hr ( normal >= 40%) 77.7% emptied at 3 hr ( normal >= 70%) 88.4% emptied at 4 hr ( normal >= 90%) IMPRESSION: Mild delayed gastric emptying revealed at the 4 hour time point (normal up to 3 hours). Electronically Signed   By: Suzy Bouchard M.D.   On: 09/03/2018 17:04    Assessment / Plan:   Assessment: 1.  Nausea and vomiting: Gastric emptying study yesterday with mild delayed gastric emptying at the 4-hour time point, patient is improved on erythromycin 2.  Uncontrolled diabetes type 1  Plan: 1.  Continue Erythromycin 250 mg 3 times daily before meals for the next 4 weeks.  Patient will then have to discontinue this medicine and take a "drug holiday" due to risk for tachyphylaxis. 2.  Patient can follow in our outpatient clinic in 3 to 4 weeks with myself or Dr. Bryan Lemma. Will arrange 3.  Recommend the patient abide by gastroparesis diet. 4. Thank you for your kind consultation.  We will sign off.   LOS: 2 days   Levin Erp  09/04/2018, 10:15 AM   Agree with Ms. Mort Sawyers evaluation and management.  Gatha Mayer, MD, [facility]

## 2018-09-04 NOTE — Progress Notes (Signed)
Hypoglycemic Event      CBG:  36  Treatment: 8oz apple juice (30g)  Symptoms: None  Follow-up CBG: Time: 0037 CBG Result: 87  Possible Reasons for Event: Inadequate meal intake vs insulin coverage for previous high cbg.  Comments/MD notified: K. Schorr notified via text page   Mancel Bale Dunkelberger

## 2018-09-04 NOTE — Discharge Instructions (Signed)
Kimberly [name] was admitted to the Hospital on 08/29/2018 and Discharged on Discharge Date 09/04/2018 and should be excused from work/school   for 7   days starting 08/29/2018 , may return to work/school without any restrictions.  Call Bess Harvest MD, [facility] [phone] with questions.  Charlynne Cousins M.D on 09/04/2018,at 10:20 AM  [facility] Office  [phone]

## 2018-09-04 NOTE — Progress Notes (Signed)
Patient d/ced home with family member

## 2018-09-04 NOTE — Discharge Summary (Addendum)
Physician Discharge Summary  Kimberly [name] SUP:[id number] DOB: [birth year] DOA: 08/29/2018  PCP: Foye Spurling, MD  Admit date: 08/29/2018 Discharge date: 09/04/2018  Admitted From: home Disposition:  Home  Recommendations for Outpatient Follow-up:  1. Follow up with GI in 1-2 weeks 2. Please obtain BMP/CBC in one week   Home Health:No Equipment/Devices:none  Discharge Condition:stable CODE STATUS:full Diet recommendation: Heart Healthy  Brief/Interim Summary: 28 y.o. female past medical history of essential hypertension, diabetes mellitus type 2 on insulin pump woke up the day of admission with nausea vomiting epigastric abdominal pain  Discharge Diagnoses:  Principal Problem:   DKA, type 1 ([place]) Active Problems:   Type 1 diabetes mellitus with diabetic nephropathy (HCC)   Intractable nausea and vomiting   Microcytic anemia Intractable nausea and vomiting possibly due to gastroparesis and DKA: She was started on IV insulin and her DKA resolved she continued to vomit. GI was consulted a gastric emptying study was done that show significant delay. She was started on IV erythromycin and it resolved. The patient cannot tolerate Reglan as this causes tardive dyskinesia. She was started on erythromycin. She was started on clear liquid diet as she tolerated her diet eating slowly. She will cont erythromycin at home for 2 weeks.  Mild DKA type I with peripheral neuropathy: She started on IV insulin her DKA resolved she started back on her insulin pump and her blood glucose has been well controlled.  Microcytic anemia: Follow-up as an outpatient with her PCP.  Essential hypertension no changes were made to her medication.  Acute kidney injury: Likely prerenal azotemia in the setting of decreased oral intake and ACE inhibitor now resolved.   Discharge Instructions  Discharge Instructions    Diet - low sodium heart healthy   Complete by:  As directed    Increase activity  slowly   Complete by:  As directed      Allergies as of 09/04/2018   No Known Allergies     Medication List    TAKE these medications   BAYER CONTOUR NEXT TEST test strip Generic drug:  glucose blood USE AS DIRECTED TO TEST 6 TO 8 TIMES DAILY   CONTOUR NEXT EZ MONITOR w/Device Kit 1 each by Does not apply route 6 (six) times daily. Provide lancets for testing six times daily. Type I DM with diabetes on Insulin Pump  DX 648.03   erythromycin ethylsuccinate 200 MG/5ML suspension Commonly known as:  EES Take 1.3 mLs (52 mg total) by mouth 4 (four) times daily -  with meals and at bedtime for 14 days.   famotidine 20 MG tablet Commonly known as:  PEPCID Take 1 tablet (20 mg total) by mouth 2 (two) times daily.   insulin lispro 100 UNIT/ML injection Commonly known as:  HUMALOG Inject into the skin 3 (three) times daily before meals. Via insulin pump   insulin pump Soln Inject into the skin. Humalog   lisinopril 20 MG tablet Commonly known as:  PRINIVIL,ZESTRIL Take 1 tablet (20 mg total) by mouth daily.   metoprolol tartrate 50 MG tablet Commonly known as:  LOPRESSOR Take 1 tablet (50 mg total) by mouth 2 (two) times daily.   NEXPLANON 68 MG Impl implant Generic drug:  etonogestrel Inject 1 each into the skin once.   ondansetron 4 MG tablet Commonly known as:  ZOFRAN Take 1 tablet (4 mg total) by mouth every 6 (six) hours.       No Known Allergies  Consultations:  None  Procedures/Studies: Dg Abd Acute W/chest  Result Date: 08/29/2018 CLINICAL DATA:  Abdomen pain for 2 days. EXAM: DG ABDOMEN ACUTE W/ 1V CHEST COMPARISON:  Chest x-ray March 19, 2018 FINDINGS: There is no evidence of dilated bowel loops or free intraperitoneal air. Bowel content is identified throughout colon. Pelvic phleboliths are noted. Heart size and mediastinal contours are within normal limits. Both lungs are clear. IMPRESSION: No bowel obstruction. Bowel content is identified throughout:  Suggesting constipation. No acute cardiopulmonary disease. Electronically Signed   By: Abelardo Diesel M.D.   On: 08/29/2018 14:39   Nm Gastric Emptying Solid Liquid Both W/sb Transit  Result Date: 09/03/2018 CLINICAL DATA:  Nausea, vomiting, early satiety, diabetes. EXAM: NUCLEAR MEDICINE GASTRIC EMPTYING SCAN TECHNIQUE: After oral ingestion of radiolabeled meal, sequential abdominal images were obtained for 4 hours. Percentage of activity emptying the stomach was calculated at 1 hour, 2 hour, 3 hour, and 4 hours. RADIOPHARMACEUTICALS:  2.1 mCi Tc-84msulfur colloid in standardized meal COMPARISON:  CT abdomen 03/19/2018 FINDINGS: Expected location of the stomach in the left upper quadrant. Ingested meal empties the stomach gradually over the course of the study. 26.8% emptied at 1 hr ( normal >= 10%) 49.8% emptied at 2 hr ( normal >= 40%) 77.7% emptied at 3 hr ( normal >= 70%) 88.4% emptied at 4 hr ( normal >= 90%) IMPRESSION: Mild delayed gastric emptying revealed at the 4 hour time point (normal up to 3 hours). Electronically Signed   By: SSuzy BouchardM.D.   On: 09/03/2018 17:04    Subjective: No complains  Discharge Exam: Vitals:   09/03/18 2019 09/04/18 0440  BP: 111/77 113/74  Pulse: 84 82  Resp:    Temp: 99 F (37.2 C) 99 F (37.2 C)  SpO2: 100% 100%   Vitals:   09/03/18 0457 09/03/18 1655 09/03/18 2019 09/04/18 0440  BP: 103/65 (!) 146/89 111/77 113/74  Pulse: 78 88 84 82  Resp: 16 20    Temp: 98.9 F (37.2 C) 98.9 F (37.2 C) 99 F (37.2 C) 99 F (37.2 C)  TempSrc: Oral Oral Oral Oral  SpO2: 100% 100% 100% 100%  Weight:      Height:        General: Pt is alert, awake, not in acute distress Cardiovascular: RRR, S1/S2 +, no rubs, no gallops Respiratory: CTA bilaterally, no wheezing, no rhonchi Abdominal: Soft, NT, ND, bowel sounds + Extremities: no edema, no cyanosis    The results of significant diagnostics from this hospitalization (including imaging,  microbiology, ancillary and laboratory) are listed below for reference.     Microbiology: No results found for this or any previous visit (from the past 240 hour(s)).   Labs: BNP (last 3 results) No results for input(s): BNP in the last 8760 hours. Basic Metabolic Panel: Recent Labs  Lab 08/31/18 1537 08/31/18 1800 08/31/18 2249 09/01/18 0513 09/02/18 0523  NA 142 146* 143 145 142  K 3.6 3.7 3.8 3.4* 3.5  CL 108 111 113* 114* 112*  CO2 22 22 19* 22 20*  GLUCOSE 170* 112* 160* 56* 172*  BUN 19 18 16 13 10   CREATININE 1.24* 1.11* 1.15* 1.12* 1.08*  CALCIUM 8.6* 8.5* 8.9 8.6* 8.4*   Liver Function Tests: Recent Labs  Lab 08/29/18 1238  AST 15  ALT 10  ALKPHOS 59  BILITOT 0.7  PROT 7.2  ALBUMIN 3.9   Recent Labs  Lab 08/29/18 1238  LIPASE 25   No results for input(s): AMMONIA in the last  168 hours. CBC: Recent Labs  Lab 08/29/18 1238 08/30/18 0555  WBC 12.8* 13.5*  NEUTROABS 11.7*  --   HGB 11.5* 12.5  HCT 34.2* 38.3  MCV 75.0* 77.4*  PLT 281 309   Cardiac Enzymes: No results for input(s): CKTOTAL, CKMB, CKMBINDEX, TROPONINI in the last 168 hours. BNP: Invalid input(s): POCBNP CBG: Recent Labs  Lab 09/04/18 0002 09/04/18 0037 09/04/18 0441 09/04/18 0730 09/04/18 1127  GLUCAP 36* 87 337* 212* 140*   D-Dimer No results for input(s): DDIMER in the last 72 hours. Hgb A1c No results for input(s): HGBA1C in the last 72 hours. Lipid Profile No results for input(s): CHOL, HDL, LDLCALC, TRIG, CHOLHDL, LDLDIRECT in the last 72 hours. Thyroid function studies No results for input(s): TSH, T4TOTAL, T3FREE, THYROIDAB in the last 72 hours.  Invalid input(s): FREET3 Anemia work up No results for input(s): VITAMINB12, FOLATE, FERRITIN, TIBC, IRON, RETICCTPCT in the last 72 hours. Urinalysis    Component Value Date/Time   COLORURINE YELLOW 08/29/2018 1515   APPEARANCEUR CLEAR 08/29/2018 1515   LABSPEC 1.014 08/29/2018 1515   PHURINE 6.0 08/29/2018  1515   GLUCOSEU >=500 (A) 08/29/2018 1515   HGBUR SMALL (A) 08/29/2018 1515   BILIRUBINUR NEGATIVE 08/29/2018 1515   KETONESUR 20 (A) 08/29/2018 1515   PROTEINUR >=300 (A) 08/29/2018 1515   UROBILINOGEN 0.2 03/21/2015 2010   NITRITE NEGATIVE 08/29/2018 1515   LEUKOCYTESUR NEGATIVE 08/29/2018 1515   Sepsis Labs Invalid input(s): PROCALCITONIN,  WBC,  LACTICIDVEN Microbiology No results found for this or any previous visit (from the past 240 hour(s)).   Time coordinating discharge: 40 minutes  SIGNED:   Charlynne Cousins, MD  [facility] 09/04/2018, 11:52 AM Pager   If 7PM-7AM, please contact night-coverage [email or web address] Password [email or web address]

## 2018-09-06 ENCOUNTER — Observation Stay (HOSPITAL_COMMUNITY)
Admission: EM | Admit: 2018-09-06 | Discharge: 2018-09-09 | Disposition: A | Discharge status: Home / Self Care | Payer: BLUE CROSS/BLUE SHIELD | Attending: Internal Medicine | Admitting: Internal Medicine

## 2018-09-06 ENCOUNTER — Encounter (HOSPITAL_COMMUNITY): Payer: Self-pay | Admitting: Emergency Medicine

## 2018-09-06 DIAGNOSIS — K3184 Gastroparesis: Principal | ICD-10-CM | POA: Insufficient documentation

## 2018-09-06 DIAGNOSIS — R112 Nausea with vomiting, unspecified: Secondary | ICD-10-CM | POA: Diagnosis present

## 2018-09-06 DIAGNOSIS — I160 Hypertensive urgency: Secondary | ICD-10-CM | POA: Diagnosis present

## 2018-09-06 DIAGNOSIS — D509 Iron deficiency anemia, unspecified: Secondary | ICD-10-CM | POA: Diagnosis not present

## 2018-09-06 DIAGNOSIS — E876 Hypokalemia: Secondary | ICD-10-CM | POA: Diagnosis not present

## 2018-09-06 DIAGNOSIS — K219 Gastro-esophageal reflux disease without esophagitis: Secondary | ICD-10-CM | POA: Insufficient documentation

## 2018-09-06 DIAGNOSIS — Z9641 Presence of insulin pump (external) (internal): Secondary | ICD-10-CM | POA: Diagnosis not present

## 2018-09-06 DIAGNOSIS — Z794 Long term (current) use of insulin: Secondary | ICD-10-CM | POA: Diagnosis not present

## 2018-09-06 DIAGNOSIS — E1021 Type 1 diabetes mellitus with diabetic nephropathy: Secondary | ICD-10-CM | POA: Insufficient documentation

## 2018-09-06 DIAGNOSIS — R000 Tachycardia, unspecified: Secondary | ICD-10-CM

## 2018-09-06 DIAGNOSIS — D72829 Elevated white blood cell count, unspecified: Secondary | ICD-10-CM | POA: Diagnosis not present

## 2018-09-06 DIAGNOSIS — I10 Essential (primary) hypertension: Secondary | ICD-10-CM | POA: Diagnosis not present

## 2018-09-06 DIAGNOSIS — Z79899 Other long term (current) drug therapy: Secondary | ICD-10-CM | POA: Diagnosis not present

## 2018-09-06 DIAGNOSIS — D573 Sickle-cell trait: Secondary | ICD-10-CM | POA: Diagnosis not present

## 2018-09-06 LAB — CBC
HCT: 34 % — ABNORMAL LOW (ref 36.0–46.0)
Hemoglobin: 11.7 g/dL — ABNORMAL LOW (ref 12.0–15.0)
MCH: 25.5 pg — ABNORMAL LOW (ref 26.0–34.0)
MCHC: 34.4 g/dL (ref 30.0–36.0)
MCV: 74.2 fL — AB (ref 80.0–100.0)
Platelets: 315 10*3/uL (ref 150–400)
RBC: 4.58 MIL/uL (ref 3.87–5.11)
RDW: 13.1 % (ref 11.5–15.5)
WBC: 18 10*3/uL — ABNORMAL HIGH (ref 4.0–10.5)
nRBC: 0 % (ref 0.0–0.2)

## 2018-09-06 LAB — URINALYSIS, ROUTINE W REFLEX MICROSCOPIC
Bilirubin Urine: NEGATIVE
Ketones, ur: 20 mg/dL — AB
Leukocytes, UA: NEGATIVE
Nitrite: NEGATIVE
Protein, ur: >=300 mg/dL — AB
Specific Gravity, Urine: 1.012 (ref 1.005–1.030)
pH: 7 (ref 5.0–8.0)

## 2018-09-06 LAB — LIPASE, BLOOD: Lipase: 22 U/L (ref 11–51)

## 2018-09-06 LAB — COMPREHENSIVE METABOLIC PANEL
ALT: 14 U/L (ref 0–44)
AST: 22 U/L (ref 15–41)
Albumin: 3.3 g/dL — ABNORMAL LOW (ref 3.5–5.0)
Alkaline Phosphatase: 68 U/L (ref 38–126)
Anion gap: 14 (ref 5–15)
BUN: 10 mg/dL (ref 6–20)
CO2: 21 mmol/L — ABNORMAL LOW (ref 22–32)
Calcium: 9.4 mg/dL (ref 8.9–10.3)
Chloride: 107 mmol/L (ref 98–111)
Creatinine, Ser: 1.17 mg/dL — ABNORMAL HIGH (ref 0.44–1.00)
GFR calc Af Amer: >60 mL/min (ref >60)
GFR calc non Af Amer: >60 mL/min (ref >60)
Glucose, Bld: 69 mg/dL — ABNORMAL LOW (ref 70–99)
Potassium: 3.4 mmol/L — ABNORMAL LOW (ref 3.5–5.1)
SODIUM: 142 mmol/L (ref 135–145)
Total Bilirubin: 0.4 mg/dL (ref 0.3–1.2)
Total Protein: 7.1 g/dL (ref 6.5–8.1)

## 2018-09-06 LAB — I-STAT BETA HCG BLOOD, ED (MC, WL, AP ONLY): I-stat hCG, quantitative: <5 m[IU]/mL (ref <5)

## 2018-09-06 MED ORDER — ACETAMINOPHEN 650 MG RE SUPP: 650.0000 mg | Freq: Four times a day (QID) | RECTAL | Status: DC | PRN

## 2018-09-06 MED ORDER — ZOLPIDEM TARTRATE 5 MG PO TABS: 5.0000 mg | ORAL_TABLET | Freq: Every evening | ORAL | Status: DC | PRN

## 2018-09-06 MED ORDER — SODIUM CHLORIDE 0.9 % IV SOLN
1000.0000 mL | INTRAVENOUS | Status: DC
  Administered 2018-09-06: 1000 mL via INTRAVENOUS

## 2018-09-06 MED ORDER — LISINOPRIL 20 MG PO TABS
20.0000 mg | ORAL_TABLET | Freq: Every day | ORAL | Status: DC
  Administered 2018-09-07 – 2018-09-09 (×3): 20 mg via ORAL
  Filled 2018-09-06 (×3): qty 1

## 2018-09-06 MED ORDER — DEXTROSE 50 % IV SOLN
50.0000 mL | INTRAVENOUS | Status: DC | PRN
  Administered 2018-09-07: 50 mL via INTRAVENOUS
  Filled 2018-09-06: qty 50

## 2018-09-06 MED ORDER — POTASSIUM CHLORIDE 20 MEQ/15ML (10%) PO SOLN
40.0000 meq | Freq: Once | ORAL | Status: DC
  Filled 2018-09-06: qty 30

## 2018-09-06 MED ORDER — ENOXAPARIN SODIUM 40 MG/0.4ML ~~LOC~~ SOLN
40.0000 mg | SUBCUTANEOUS | Status: DC
  Administered 2018-09-07 – 2018-09-09 (×3): 40 mg via SUBCUTANEOUS
  Filled 2018-09-06 (×3): qty 0.4

## 2018-09-06 MED ORDER — METOPROLOL TARTRATE 25 MG PO TABS
50.0000 mg | ORAL_TABLET | Freq: Two times a day (BID) | ORAL | Status: DC
  Administered 2018-09-07 – 2018-09-09 (×6): 50 mg via ORAL
  Filled 2018-09-06 (×6): qty 2

## 2018-09-06 MED ORDER — ONDANSETRON HCL 4 MG/2ML IJ SOLN
4.0000 mg | Freq: Once | INTRAMUSCULAR | Status: AC
  Administered 2018-09-06: 4 mg via INTRAVENOUS
  Filled 2018-09-06: qty 2

## 2018-09-06 MED ORDER — SODIUM CHLORIDE 0.9 % IV BOLUS (SEPSIS)
1000.0000 mL | Freq: Once | INTRAVENOUS | Status: AC
  Administered 2018-09-06: 1000 mL via INTRAVENOUS

## 2018-09-06 MED ORDER — INSULIN PUMP
SUBCUTANEOUS | Status: DC
  Administered 2018-09-07: 3.7 via SUBCUTANEOUS
  Administered 2018-09-09: 2 via SUBCUTANEOUS
  Filled 2018-09-06: qty 1

## 2018-09-06 MED ORDER — PROMETHAZINE HCL 25 MG/ML IJ SOLN
12.5000 mg | Freq: Once | INTRAMUSCULAR | Status: AC
  Administered 2018-09-06: 12.5 mg via INTRAVENOUS
  Filled 2018-09-06: qty 1

## 2018-09-06 MED ORDER — SODIUM CHLORIDE 0.9 % IV SOLN
250.0000 mg | Freq: Four times a day (QID) | INTRAVENOUS | Status: DC
  Administered 2018-09-06 – 2018-09-09 (×11): 250 mg via INTRAVENOUS
  Filled 2018-09-06 (×17): qty 5

## 2018-09-06 MED ORDER — ONDANSETRON HCL 4 MG/2ML IJ SOLN
4.0000 mg | Freq: Three times a day (TID) | INTRAMUSCULAR | Status: DC | PRN
  Administered 2018-09-06 – 2018-09-07 (×2): 4 mg via INTRAVENOUS
  Filled 2018-09-06 (×2): qty 2

## 2018-09-06 MED ORDER — FAMOTIDINE IN NACL 20-0.9 MG/50ML-% IV SOLN
20.0000 mg | Freq: Two times a day (BID) | INTRAVENOUS | Status: DC
  Administered 2018-09-06 – 2018-09-08 (×5): 20 mg via INTRAVENOUS
  Filled 2018-09-06 (×5): qty 50

## 2018-09-06 MED ORDER — HYDRALAZINE HCL 20 MG/ML IJ SOLN
5.0000 mg | INTRAMUSCULAR | Status: DC | PRN
  Administered 2018-09-06: 5 mg via INTRAVENOUS
  Filled 2018-09-06: qty 1

## 2018-09-06 MED ORDER — LORAZEPAM 2 MG/ML IJ SOLN
0.5000 mg | Freq: Once | INTRAMUSCULAR | Status: AC
  Administered 2018-09-06: 0.5 mg via INTRAVENOUS
  Filled 2018-09-06: qty 1

## 2018-09-06 MED ORDER — MORPHINE SULFATE (PF) 2 MG/ML IV SOLN
2.0000 mg | INTRAVENOUS | Status: DC | PRN
  Administered 2018-09-07 – 2018-09-08 (×5): 2 mg via INTRAVENOUS
  Filled 2018-09-06 (×5): qty 1

## 2018-09-06 MED ORDER — SODIUM CHLORIDE 0.9 % IV BOLUS
1000.0000 mL | Freq: Once | INTRAVENOUS | Status: AC
  Administered 2018-09-06: 1000 mL via INTRAVENOUS

## 2018-09-06 MED ORDER — ACETAMINOPHEN 325 MG PO TABS: 650.0000 mg | ORAL_TABLET | Freq: Four times a day (QID) | ORAL | Status: DC | PRN

## 2018-09-06 MED ORDER — POTASSIUM CHLORIDE CRYS ER 20 MEQ PO TBCR
40.0000 meq | EXTENDED_RELEASE_TABLET | Freq: Once | ORAL | Status: AC
  Administered 2018-09-07: 40 meq via ORAL
  Filled 2018-09-06: qty 2

## 2018-09-06 NOTE — ED Notes (Signed)
Patient able to keep Sprite down.

## 2018-09-06 NOTE — ED Provider Notes (Addendum)
[place] EMERGENCY DEPARTMENT Provider Note   CSN: [id number] Arrival date & time: 09/06/18  1323     History   Chief Complaint No chief complaint on file.   HPI Kimberly [name] is a 28 y.o. female.  The history is provided by the patient. No language interpreter was used.  Emesis  Severity:  Mild Timing:  Constant Number of daily episodes:  Multiple Quality:  Stomach contents Progression:  Unchanged Chronicity:  New Recent urination:  Normal Relieved by:  Nothing Worsened by:  Nothing Ineffective treatments:  None tried Associated symptoms: abdominal pain   Risk factors: diabetes   Pt discharged from hospital 2 days ago.  Pt reports she was told her gastric empty study was abnormal.    Past Medical History:  Diagnosis Date  . Diabetes mellitus without complication ([place])   . Preeclampsia   . Preeclampsia without severe features   . Sickle cell trait [facility])     Patient Active Problem List   Diagnosis Date Noted  . Intractable nausea and vomiting 08/29/2018  . Microcytic anemia 08/29/2018  . CIN III (cervical intraepithelial neoplasia grade III) with severe dysplasia 09/09/2017  . DKA, type 1 ([place]) 03/17/2015  . Diabetic nephropathy associated with type 1 diabetes mellitus ([place]) 03/17/2015  . Type 1 diabetes mellitus with diabetic nephropathy ([place]) 05/25/2013    Past Surgical History:  Procedure Laterality Date  . WISDOM TOOTH EXTRACTION       OB History    Gravida  1   Para  1   Term      Preterm  1   AB      Living  1     SAB      TAB      Ectopic      Multiple      Live Births  1            Home Medications    Prior to Admission medications   Medication Sig Start Date End Date Taking? Authorizing Provider  BAYER CONTOUR NEXT TEST test strip USE AS DIRECTED TO TEST 6 TO 8 TIMES DAILY 10/01/14   Woodroe Mode, MD  Blood Glucose Monitoring Suppl (CONTOUR NEXT EZ MONITOR) W/DEVICE KIT 1 each by Does not apply  route 6 (six) times daily. Provide lancets for testing six times daily. Type I DM with diabetes on Insulin Pump  DX 648.03 06/17/13   Woodroe Mode, MD  erythromycin ethylsuccinate (EES) 200 MG/5ML suspension Take 1.3 mLs (52 mg total) by mouth 4 (four) times daily -  with meals and at bedtime for 14 days. 09/04/18 09/18/18  Charlynne Cousins, MD  etonogestrel (NEXPLANON) 68 MG IMPL implant Inject 1 each into the skin once.    [provider]  famotidine (PEPCID) 20 MG tablet Take 1 tablet (20 mg total) by mouth 2 (two) times daily. 03/23/18   Doreatha Lew, MD  Insulin Human (INSULIN PUMP) SOLN Inject into the skin. Humalog    [provider]  insulin lispro (HUMALOG) 100 UNIT/ML injection Inject into the skin 3 (three) times daily before meals. Via insulin pump    [provider]  lisinopril (PRINIVIL,ZESTRIL) 20 MG tablet Take 1 tablet (20 mg total) by mouth daily. 03/24/18   Doreatha Lew, MD  metoprolol tartrate (LOPRESSOR) 50 MG tablet Take 1 tablet (50 mg total) by mouth 2 (two) times daily. 03/23/18   Doreatha Lew, MD  ondansetron (ZOFRAN) 4 MG tablet  Take 1 tablet (4 mg total) by mouth every 6 (six) hours. 03/19/18   Hayden Rasmussen, MD    Family History Family History  Problem Relation Age of Onset  . Lupus Mother   . Aneurysm Mother     Social History Social History   Tobacco Use  . Smoking status: Never Smoker  . Smokeless tobacco: Never Used  Substance Use Topics  . Alcohol use: No  . Drug use: No     Allergies   Patient has no known allergies.   Review of Systems Review of Systems  Gastrointestinal: Positive for abdominal pain and vomiting.  All other systems reviewed and are negative.    Physical Exam Updated Vital Signs BP (!) 132/115 (BP Location: Right Arm)   Pulse (!) 124   Temp 98.6 F (37 C) (Oral)   Resp 15   Ht 5' 5"  (1.651 m)   Wt 73.5 kg   LMP 09/02/2018   SpO2 100%   BMI 26.96 kg/m    Physical Exam Vitals signs and nursing note reviewed.  Constitutional:      Appearance: She is well-developed.  HENT:     Head: Normocephalic.     Right Ear: Tympanic membrane normal.     Left Ear: Tympanic membrane normal.     Nose: Nose normal.  Eyes:     Extraocular Movements: Extraocular movements intact.     Conjunctiva/sclera: Conjunctivae normal.     Pupils: Pupils are equal, round, and reactive to light.  Neck:     Musculoskeletal: Normal range of motion and neck supple.  Cardiovascular:     Rate and Rhythm: Normal rate and regular rhythm.  Pulmonary:     Effort: Pulmonary effort is normal.  Abdominal:     General: Abdomen is flat. There is no distension.     Palpations: Abdomen is soft.  Musculoskeletal: Normal range of motion.  Skin:    General: Skin is warm.  Neurological:     Mental Status: She is alert and oriented to person, place, and time.  Psychiatric:        Mood and Affect: Mood normal.      ED Treatments / Results  Labs (all labs ordered are listed, but only abnormal results are displayed) Labs Reviewed  COMPREHENSIVE METABOLIC PANEL - Abnormal; Notable for the following components:      Result Value   Potassium 3.4 (*)    CO2 21 (*)    Glucose, Bld 69 (*)    Creatinine, Ser 1.17 (*)    Albumin 3.3 (*)    All other components within normal limits  CBC - Abnormal; Notable for the following components:   WBC 18.0 (*)    Hemoglobin 11.7 (*)    HCT 34.0 (*)    MCV 74.2 (*)    MCH 25.5 (*)    All other components within normal limits  URINALYSIS, ROUTINE W REFLEX MICROSCOPIC - Abnormal; Notable for the following components:   APPearance HAZY (*)    Glucose, UA >=500 (*)    Hgb urine dipstick SMALL (*)    Ketones, ur 20 (*)    Protein, ur >=300 (*)    Bacteria, UA RARE (*)    All other components within normal limits  LIPASE, BLOOD  I-STAT BETA HCG BLOOD, ED ([facility], [facility], [facility] ONLY)    EKG None  Radiology No results  found.  Procedures Procedures (including critical care time)  Medications Ordered in ED Medications  sodium chloride 0.9 %  bolus 1,000 mL (1,000 mLs Intravenous New Bag/Given 09/06/18 1507)    Followed by  sodium chloride 0.9 % bolus 1,000 mL (1,000 mLs Intravenous New Bag/Given 09/06/18 1509)    Followed by  0.9 %  sodium chloride infusion (has no administration in time range)  ondansetron (ZOFRAN) injection 4 mg (4 mg Intravenous Given 09/06/18 1509)     Initial Impression / Assessment and Plan / ED Course  I have reviewed the triage vital signs and the nursing notes.  Pertinent labs & imaging results that were available during my care of the patient were reviewed by me and considered in my medical decision making (see chart for details).  Clinical Course as of Sep 08 639  Buddy Duty Dr. Chalmers Cater With [place] over terrace   [EH]  2223 Spoke with Dr. Blaine Hamper who will admit the patient.    [EH]    Clinical Course User Index [EH] Lorin Glass, PA-C    Gastric emptying study reviewed. Notes reviewed.  Pt recently of erythromycin for symptoms  Pt started on Iv fluids.  Pt given zofran IV. Pt reports no relief.  I spoke to ED pharmacist who advised I can start Iv erythromycin and have pt transition back to oral.  I will try ativan and phenergan. Pt reports she can not take reglan    Final Clinical Impressions(s) / ED Diagnoses   Final diagnoses:  Gastroparesis  Tachycardia  Diabetic nephropathy associated with type 1 diabetes mellitus [facility])    ED Discharge Orders    None       Fransico Meadow, PA-C 09/06/18 1613    Fransico Meadow, PA-C 09/07/18 5379    Gareth Morgan, MD 09/07/18 [phone]

## 2018-09-06 NOTE — ED Provider Notes (Signed)
I assumed care of patient from Dr. Billy Fischer at shift change, please see their note for full details.    Physical Exam  BP (!) 180/102 (BP Location: Right Arm)   Pulse (!) 128   Temp 99.4 F (37.4 C) (Oral)   Resp 18   Ht 5\' 5"  (1.651 m)   Wt 73.5 kg   LMP 09/02/2018   SpO2 100%   BMI 26.96 kg/m   Physical Exam Vitals signs and nursing note reviewed.  Cardiovascular:     Rate and Rhythm: Tachycardia present.  Abdominal:     Tenderness: There is generalized abdominal tenderness.  Neurological:     Mental Status: She is alert.     ED Course/Procedures   Clinical Course as of Sep 06 2222  Thu Sep 06, 2018  2156 Dr. Chalmers Cater With west over terrace   [EH]  2223 Spoke with Dr. Blaine Hamper who will admit the patient.    [EH]    Clinical Course User Index [EH] Lorin Glass, PA-C    Procedures     MDM   Plan to re-evaluate patient after 1 liter.  Continue to PO challenge, F/u on HR after 1 liter, if above 115 then re-admit.   After patient got her third liter of fluid her heart rate still remained elevated in the 120s to 130s.  Will admit.         Ollen Gross 09/06/18 2225    Virgel Manifold, MD 09/11/18 1759

## 2018-09-06 NOTE — H&P (Addendum)
History and Physical    Kimberly [name] WNU:[id number] DOB: February 25, 1991 DOA: 09/06/2018  Referring MD/NP/PA:   PCP: Foye Spurling, MD   Patient coming from:  The patient is coming from home.  At baseline, pt is independent for most of ADL.        Chief Complaint: Intractable nausea, vomiting  HPI: Kimberly [name] is a 28 y.o. female with medical history significant of type 1 diabetes on insulin pump, GERD, gastroparesis, sickle cell trait, who presents with intractable nausea and vomiting.  Patient was recently hospitalized from 1/1-1/7 due to mild DKA and intractable nausea vomiting.  Patient had gastric emptying test, which showed significant delay. She was diagnosed as gastroparesis. She was treated with IV erythromycin and it resolved. Of note, the patient cannot tolerate Reglan as this causes tardive dyskinesia. pt states that she developed worsening nausea and vomiting in the past 2 days. She has vomited more than 10 times with greenish colored vomitus, no blood. She cannot keep any down. She also has abdominal pressure and pain, which is constant, 7 out of 10 severity, involving whole abdomen, radiating to the lower chest..  No diarrhea.  She has mild shortness breath which she has a severe vomiting, no cough, fever or chills.  No symptoms of UTI.  No unilateral weakness.  ED Course: pt was found to have WBC 18, negative pregnancy test, lipase 25, negative urinalysis, creatinine 1.17, BUN 10, GFR>60, temperature 99.4, tachycardia, elevated blood pressure 193/101, 180/102, potassium 3.4.  Patient is placed on MedSurg [place] for observation.  Review of Systems:   General: no fevers, chills, no body weight gain, has poor appetite, has fatigue HEENT: no blurry vision, hearing changes or sore throat Respiratory: has mild dyspnea, no coughing, wheezing CV: no chest pain, no palpitations GI: has nausea, vomiting, abdominal pain, no diarrhea, constipation GU: no dysuria, burning on  urination, increased urinary frequency, hematuria  Ext: no leg edema Neuro: no unilateral weakness, numbness, or tingling, no vision change or hearing loss Skin: no rash, no skin tear. MSK: No muscle spasm, no deformity, no limitation of range of movement in spin Heme: No easy bruising.  Travel history: No recent long distant travel.  Allergy: No Known Allergies  Past Medical History:  Diagnosis Date  . Diabetes mellitus without complication ([place])   . Preeclampsia   . Preeclampsia without severe features   . Sickle cell trait [facility])     Past Surgical History:  Procedure Laterality Date  . WISDOM TOOTH EXTRACTION      Social History:  reports that she has never smoked. She has never used smokeless tobacco. She reports that she does not drink alcohol or use drugs.  Family History:  Family History  Problem Relation Age of Onset  . Lupus Mother   . Aneurysm Mother      Prior to Admission medications   Medication Sig Start Date End Date Taking? Authorizing Provider  BAYER CONTOUR NEXT TEST test strip USE AS DIRECTED TO TEST 6 TO 8 TIMES DAILY 10/01/14   Woodroe Mode, MD  Blood Glucose Monitoring Suppl (CONTOUR NEXT EZ MONITOR) W/DEVICE KIT 1 each by Does not apply route 6 (six) times daily. Provide lancets for testing six times daily. Type I DM with diabetes on Insulin Pump  DX 648.03 06/17/13   Woodroe Mode, MD  erythromycin ethylsuccinate (EES) 200 MG/5ML suspension Take 1.3 mLs (52 mg total) by mouth 4 (four) times daily -  with meals and at bedtime  for 14 days. 09/04/18 09/18/18  Charlynne Cousins, MD  etonogestrel (NEXPLANON) 68 MG IMPL implant Inject 1 each into the skin once.    [provider]  famotidine (PEPCID) 20 MG tablet Take 1 tablet (20 mg total) by mouth 2 (two) times daily. 03/23/18   Doreatha Lew, MD  Insulin Human (INSULIN PUMP) SOLN Inject into the skin. Humalog    [provider]  insulin lispro (HUMALOG) 100 UNIT/ML injection Inject  into the skin 3 (three) times daily before meals. Via insulin pump    [provider]  lisinopril (PRINIVIL,ZESTRIL) 20 MG tablet Take 1 tablet (20 mg total) by mouth daily. 03/24/18   Doreatha Lew, MD  metoprolol tartrate (LOPRESSOR) 50 MG tablet Take 1 tablet (50 mg total) by mouth 2 (two) times daily. 03/23/18   Doreatha Lew, MD  ondansetron (ZOFRAN) 4 MG tablet Take 1 tablet (4 mg total) by mouth every 6 (six) hours. 03/19/18   Hayden Rasmussen, MD    Physical Exam: Vitals:   09/06/18 1328 09/06/18 1618 09/06/18 2121 09/06/18 2129  BP: (!) 132/115 (!) 193/101 (!) 178/100 (!) 180/102  Pulse: (!) 124 (!) 125 (!) 133 (!) 128  Resp: _0 Temp: 98.6 F (37 C)  99.4 F (37.4 C)   TempSrc: Oral  Oral   SpO2: 100% 100% 100% 100%  Weight:      Height:       General: Not in acute distress. Dry mucous membrane HEENT:       Eyes: PERRL, EOMI, no scleral icterus.       ENT: No discharge from the ears and nose, no pharynx injection, no tonsillar enlargement.        Neck: No JVD, no bruit, no mass felt. Heme: No neck lymph node enlargement. Cardiac: S1/S2, RRR, No murmurs, No gallops or rubs. Respiratory: No rales, wheezing, rhonchi or rubs. GI: Soft, nondistended, mild diffused tenderness, no rebound pain, no organomegaly, BS present. GU: No hematuria Ext: No pitting leg edema bilaterally. 2+DP/PT pulse bilaterally. Musculoskeletal: No joint deformities, No joint redness or warmth, no limitation of ROM in spin. Skin: No rashes.  Neuro: Alert, oriented X3, cranial nerves II-XII grossly intact, moves all extremities normally.  Psych: Patient is not psychotic, no suicidal or hemocidal ideation.  Labs on Admission: I have personally reviewed following labs and imaging studies  CBC: Recent Labs  Lab 09/06/18 1345  WBC 18.0*  HGB 11.7*  HCT 34.0*  MCV 74.2*  PLT 786   Basic Metabolic Panel: Recent Labs  Lab 08/31/18 1800 08/31/18 2249 09/01/18 0513  09/02/18 0523 09/06/18 1345  NA 146* 143 145 142 142  K 3.7 3.8 3.4* 3.5 3.4*  CL 111 113* 114* 112* 107  CO2 22 19* 22 20* 21*  GLUCOSE 112* 160* 56* 172* 69*  BUN _1 CREATININE 1.11* 1.15* 1.12* 1.08* 1.17*  CALCIUM 8.5* 8.9 8.6* 8.4* 9.4   GFR: Estimated Creatinine Clearance: 71.2 mL/min (A) (by C-G formula based on SCr of 1.17 mg/dL (H)). Liver Function Tests: Recent Labs  Lab 09/06/18 1345  AST 22  ALT 14  ALKPHOS 68  BILITOT 0.4  PROT 7.1  ALBUMIN 3.3*   Recent Labs  Lab 09/06/18 1345  LIPASE 22   No results for input(s): AMMONIA in the last 168 hours. Coagulation Profile: No results for input(s): INR, PROTIME in the last 168 hours. Cardiac Enzymes: No results for input(s): CKTOTAL, CKMB,  CKMBINDEX, TROPONINI in the last 168 hours. BNP (last 3 results) No results for input(s): PROBNP in the last 8760 hours. HbA1C: No results for input(s): HGBA1C in the last 72 hours. CBG: Recent Labs  Lab 09/04/18 0002 09/04/18 0037 09/04/18 0441 09/04/18 0730 09/04/18 1127  GLUCAP 36* 87 337* 212* 140*   Lipid Profile: No results for input(s): CHOL, HDL, LDLCALC, TRIG, CHOLHDL, LDLDIRECT in the last 72 hours. Thyroid Function Tests: No results for input(s): TSH, T4TOTAL, FREET4, T3FREE, THYROIDAB in the last 72 hours. Anemia Panel: No results for input(s): VITAMINB12, FOLATE, FERRITIN, TIBC, IRON, RETICCTPCT in the last 72 hours. Urine analysis:    Component Value Date/Time   COLORURINE YELLOW 09/06/2018 1328   APPEARANCEUR HAZY (A) 09/06/2018 1328   LABSPEC 1.012 09/06/2018 1328   PHURINE 7.0 09/06/2018 1328   GLUCOSEU >=500 (A) 09/06/2018 1328   HGBUR SMALL (A) 09/06/2018 1328   BILIRUBINUR NEGATIVE 09/06/2018 1328   KETONESUR 20 (A) 09/06/2018 1328   PROTEINUR >=300 (A) 09/06/2018 1328   UROBILINOGEN 0.2 03/21/2015 2010   NITRITE NEGATIVE 09/06/2018 1328   LEUKOCYTESUR NEGATIVE 09/06/2018 1328   Sepsis  Labs: _0 (procalcitonin:4,lacticidven:4) )No results found for this or any previous visit (from the past 240 hour(s)).   Radiological Exams on Admission: No results found.   EKG: Independently reviewed.  Sinus tachycardia, QTC 459, early R wave progression, nonspecific T wave change.  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  Lipase normal.  -will place on med-surg bed for obs -IV erythromycin to 250 mg every 6 hours -PRN Zofran nausea vomiting and morphine for abdominal pain -IV fluid: 3 L normal saline bolus, followed by 125 cc/h  Type 1 diabetes mellitus with diabetic nephropathy ([place]): Last A1c 7.5 on 08/31/08, as type I DM, it is fairly controlled. Patient is using insulin pump at home. Her blood sugar is little low 67, pump is temporarily stopped now, will resume later. -continue Insulin pump  Microcytic anemia: Hemoglobin 12.5 on 08/31/2019, 11.7, slightly dropped.  No active bleeding. -Follow-up CBC  Hypokalemia: K=3.4 -repleted  Hypertensive urgency: Bp 193/101-->180/103 -Continue home lisinopril and metoprolol -IV hydralazine as needed  Leukocytosis: no feverl. No signs of infection. Likely due to stress induced to demargination. UA negative. -follow up by CBC  GERD: -pepcid by IV   DVT ppx: SQ Lovenox Code Status: Full code Family Communication: None at bed side.   Disposition Plan:  Anticipate discharge back to previous home environment Consults called:  none Admission status: medical floor/obs      Date of Service 09/06/2018    Ivor Costa [facility] Pager [phone]  If 7PM-7AM,  please contact night-coverage [email or web address] Password [facility] 09/06/2018, 10:52 PM

## 2018-09-06 NOTE — ED Notes (Signed)
Pt provided with Sprite Zero, PO Challenge

## 2018-09-06 NOTE — ED Notes (Signed)
Pt refused anything to drink at this time.  Full emesis bag in patient's lap, states she just vomited.  APP made aware.

## 2018-09-06 NOTE — ED Triage Notes (Signed)
Pt seen at [facility] yesterday for abd pain and discharged with diagnosis of gastroparesis. Pt has continued to vomit and unable to keep any food/drink down. Complains of abd pain as well. BP 160/92, HR 120, CBG 82. 100% on room air.

## 2018-09-06 NOTE — Discharge Instructions (Addendum)
Start taking Erythromycin as directed

## 2018-09-07 ENCOUNTER — Other Ambulatory Visit: Payer: Self-pay

## 2018-09-07 DIAGNOSIS — R112 Nausea with vomiting, unspecified: Secondary | ICD-10-CM | POA: Diagnosis not present

## 2018-09-07 LAB — GLUCOSE, CAPILLARY
GLUCOSE-CAPILLARY: 102 mg/dL — AB (ref 70–99)
GLUCOSE-CAPILLARY: 170 mg/dL — AB (ref 70–99)
Glucose-Capillary: 59 mg/dL — ABNORMAL LOW (ref 70–99)
Glucose-Capillary: 172 mg/dL — ABNORMAL HIGH (ref 70–99)
Glucose-Capillary: 55 mg/dL — ABNORMAL LOW (ref 70–99)
Glucose-Capillary: 182 mg/dL — ABNORMAL HIGH (ref 70–99)
Glucose-Capillary: 337 mg/dL — ABNORMAL HIGH (ref 70–99)
Glucose-Capillary: 182 mg/dL — ABNORMAL HIGH (ref 70–99)

## 2018-09-07 LAB — CBC
HCT: 29.1 % — ABNORMAL LOW (ref 36.0–46.0)
Hemoglobin: 10 g/dL — ABNORMAL LOW (ref 12.0–15.0)
MCH: 25.6 pg — ABNORMAL LOW (ref 26.0–34.0)
MCHC: 34.4 g/dL (ref 30.0–36.0)
MCV: 74.6 fL — ABNORMAL LOW (ref 80.0–100.0)
Platelets: 292 10*3/uL (ref 150–400)
RBC: 3.9 MIL/uL (ref 3.87–5.11)
RDW: 13.2 % (ref 11.5–15.5)
WBC: 13.4 10*3/uL — AB (ref 4.0–10.5)
nRBC: 0 % (ref 0.0–0.2)

## 2018-09-07 LAB — BASIC METABOLIC PANEL
Anion gap: 8 (ref 5–15)
BUN: 8 mg/dL (ref 6–20)
CO2: 21 mmol/L — ABNORMAL LOW (ref 22–32)
Calcium: 8.5 mg/dL — ABNORMAL LOW (ref 8.9–10.3)
Chloride: 116 mmol/L — ABNORMAL HIGH (ref 98–111)
Creatinine, Ser: 1.18 mg/dL — ABNORMAL HIGH (ref 0.44–1.00)
GFR calc Af Amer: >60 mL/min (ref >60)
GFR calc non Af Amer: >60 mL/min (ref >60)
Glucose, Bld: 63 mg/dL — ABNORMAL LOW (ref 70–99)
Potassium: 3.8 mmol/L (ref 3.5–5.1)
SODIUM: 145 mmol/L (ref 135–145)

## 2018-09-07 MED ORDER — SENNOSIDES-DOCUSATE SODIUM 8.6-50 MG PO TABS
1.0000 | ORAL_TABLET | Freq: Two times a day (BID) | ORAL | Status: DC | PRN
  Administered 2018-09-07 – 2018-09-08 (×2): 1 via ORAL
  Filled 2018-09-07 (×2): qty 1

## 2018-09-07 MED ORDER — SODIUM CHLORIDE 0.9 % IV SOLN: 1000.0000 mL | INTRAVENOUS | Status: DC

## 2018-09-07 NOTE — Progress Notes (Signed)
Inpatient Diabetes Program Recommendations  AACE/ADA: New Consensus Statement on Inpatient Glycemic Control (2015)  Target Ranges:  Prepandial:   less than 140 mg/dL      Peak postprandial:   less than 180 mg/dL (1-2 hours)      Critically ill patients:  140 - 180 mg/dL   Results for Kimberly [name], Kimberly [name] (MRN [id number]) as of 09/07/2018 11:22  Ref. Range 09/06/2018 23:57 09/07/2018 04:46 09/07/2018 07:27 09/07/2018 07:51 09/07/2018 08:20 09/07/2018 11:09  Glucose-Capillary Latest Ref Range: 70 - 99 mg/dL 337 (H)  3.7 units by patient per Home Insulin Pump 102 (H) 59 (L) 55 (L) 172 (H) 182 (H)    Admit with: Intractable nausea, vomiting  History: Type 1 Diabetes, Gastroparesis  Home DM Meds: Insulin Pump  Current Orders: Insulin Pump Q4 hours    ENDO: Dr. Chalmers Cater with [facility]--Last visit with Dr. Chalmers Cater was in December.  No changes made to Insulin pump at that visit.  Started Insulin Pump: about 4 months ago.  Met with pt today.  Pt A&O and able to independently manage insulin pump.  Has extra pump supplies at bedside.  Wearing Sensor on L side of Abdomen.  Pt aware that RN will need to check fingerstick CBGs with hospital meter.  Pt aware to tell RN about all insulin boluses given to herself with her pump.  Pt placed her current insertion site at midnight last PM (01/10).  Reviewed all charting needs with RN caring for pt today.  --Insulin Pump Settings--  Basal Rates: 0.75 units/hr--24 hours per day  Total Basal Insulin per 24 hours period= 18 units  Carbohydrate Ratio: 1 unit for every 10 Grams of Carbohydrates  Correction/Sensitivity Factor: 1 unit for every 50 mg/dl above Target CBG  Target CBG: 100-150 mg/dl     --Will follow patient during hospitalization--  Wyn Quaker RN, MSN, CDE Diabetes Coordinator Inpatient Glycemic Control Team Team Pager: [phone] (8a-5p)

## 2018-09-07 NOTE — Progress Notes (Signed)
Pt. transported from ER via stretcher to 11M-18; alert and oriented x4; pt. took insulin pump off in ER and will reconnect it to herself. Pt. oriented to room and call button.

## 2018-09-07 NOTE — Progress Notes (Signed)
CBG: 59  Treatment: 4 oz juice/soda (Given by NT by mistake)  Symptoms: None  Follow-up CBG: Time:0751 CBG Result:55  Possible Reasons for Event: Inadequate meal intake and Other: Insulin pump    Treatment: D50 50 mL (25 gm)  Symptoms: None  Follow-up CBG: Time:0820 CBG Result:172  Possible Reasons for Event: Inadequate meal intake  Comments/MD notified: Dr. Reesa Chew

## 2018-09-07 NOTE — Plan of Care (Signed)
  Problem: Education: Goal: Knowledge of General Education information will improve Description Including pain rating scale, medication(s)/side effects and non-pharmacologic comfort measures Outcome: Progressing Note:  POC and orders reviewed with pt.   

## 2018-09-07 NOTE — Progress Notes (Signed)
Patient discharged home,accompanied by daughter. Discharge instructions/papers given by Joette Catching to patient and daughter. Belongings sent with patient. Andrez Lieurance, Wonda Cheng, Therapist, sports

## 2018-09-07 NOTE — Progress Notes (Signed)
PROGRESS NOTE    Kimberly [name]  DXA:[id number] DOB: Jan 01, 1991 DOA: 09/06/2018 PCP: Foye Spurling, MD   Brief Narrative:  28 year old with history of type 1 diabetes on insulin pump, GERD, gastroparesis, sickle cell trait came to the hospital with complains of intractable nausea and vomiting.  Patient was admitted earlier this month with similar complaints and was in DKA at that time.  Eventually she was discharged on the seventh.  Now she returns with a similar complaints.  She was diagnosed with gastroparesis and admitted on IV erythromycin.   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 -Supportive care. -No advanced imaging for abdomen is necessary, abdominal exam is relatively benign. -Check QTC tomorrow morning again  Essential hypertension, uncontrolled - Blood pressure still elevated this morning.  She is going to get her home medication metoprolol and lisinopril.  Continue IV hydralazine.  If it still remains elevated we will increase her dose of lisinopril  Type I diabetes mellitus type 2 with diabetic nephropathy -Last hemoglobin A1c 08/31/2018-7.5 - Patient has insulin pump.  Diabetic coordinator consulted.  Leukocytosis -Suspect this is reactive.  Continue close monitoring.  No obvious evidence of infection, hold off on antibiotics  Microcytic anemia - Follow-up outpatient.  Hemoglobin stable without any obvious evidence of bleeding.  DVT prophylaxis: Subcutaneous Lovenox Code Status: Full code Family  Communication: None at bedside Disposition Plan: Maintain inpatient stay for at least another 24 hours as patient still has nausea and vomiting with very poor oral intake.  I am afraid she will get hypoglycemic if her p.o. status remains inadequate.  Consultants:   Diabetic coordinator  Procedures:   None  Antimicrobials:   None   Subjective: Patient had an episode of hypoglycemia this morning which was treated with amp of D50.  Her blood glucose was as low as 55.  Minimal improvement despite of drinking orange juice therefore received amp of D50.  Follow-up blood glucose 172.  When I saw the patient she felt slightly nauseous.  Minimal improvement from yesterday.  Review of Systems Otherwise negative except as per HPI, including: General: Denies fever, chills, night sweats or unintended weight loss. Resp: Denies cough, wheezing, shortness of breath. Cardiac: Denies chest pain, palpitations, orthopnea, paroxysmal nocturnal dyspnea. GI: Denies abdominal pain, diarrhea or constipation GU: Denies dysuria, frequency, hesitancy or incontinence MS: Denies muscle aches, joint pain or swelling Neuro: Denies headache, neurologic deficits (focal weakness, numbness, tingling), abnormal gait Psych: Denies anxiety, depression, SI/HI/AVH Skin: Denies new rashes or lesions ID: Denies sick contacts, exotic exposures, travel  Objective: Vitals:   09/06/18 2328 09/06/18 2355 09/07/18 0447 09/07/18 0822  BP: (!) 176/106 (!) 154/91 132/89 (!) 151/92  Pulse: (!) 131 (!) 130 92 91  Resp: 18 20 18 17   Temp: 99.6 F (37.6 C) 99 F (37.2 C) 98.9 F (37.2 C) 98.5 F (36.9 C)  TempSrc:  Oral Oral Oral  SpO2: 100% 100% 98% 100%  Weight:      Height:        Intake/Output Summary (Last 24 hours) at  09/07/2018 1223 Last data filed at 09/07/2018 1031 Gross per 24 hour  Intake 1300 ml  Output 0 ml  Net 1300 ml   Filed Weights   09/06/18 1326  Weight: 73.5 kg    Examination:  General  exam: Appears calm and comfortable, dry mouth Respiratory system: Clear to auscultation. Respiratory effort normal. Cardiovascular system: S1 & S2 heard, RRR. No JVD, murmurs, rubs, gallops or clicks. No pedal edema. Gastrointestinal system: Abdomen is nondistended, soft and nontender. No organomegaly or masses felt. Normal bowel sounds heard. Central nervous system: Alert and oriented. No focal neurological deficits. Extremities: Symmetric 5 x 5 power. Skin: No rashes, lesions or ulcers Psychiatry: Judgement and insight appear normal. Mood & affect appropriate.     Data Reviewed:   CBC: Recent Labs  Lab 09/06/18 1345 09/07/18 0730  WBC 18.0* 13.4*  HGB 11.7* 10.0*  HCT 34.0* 29.1*  MCV 74.2* 74.6*  PLT 315 003   Basic Metabolic Panel: Recent Labs  Lab 08/31/18 2249 09/01/18 0513 09/02/18 0523 09/06/18 1345 09/07/18 0730  NA 143 145 142 142 145  K 3.8 3.4* 3.5 3.4* 3.8  CL 113* 114* 112* 107 116*  CO2 19* 22 20* 21* 21*  GLUCOSE 160* 56* 172* 69* 63*  BUN 16 13 10 10 8   CREATININE 1.15* 1.12* 1.08* 1.17* 1.18*  CALCIUM 8.9 8.6* 8.4* 9.4 8.5*   GFR: Estimated Creatinine Clearance: 70.6 mL/min (A) (by C-G formula based on SCr of 1.18 mg/dL (H)). Liver Function Tests: Recent Labs  Lab 09/06/18 1345  AST 22  ALT 14  ALKPHOS 68  BILITOT 0.4  PROT 7.1  ALBUMIN 3.3*   Recent Labs  Lab 09/06/18 1345  LIPASE 22   No results for input(s): AMMONIA in the last 168 hours. Coagulation Profile: No results for input(s): INR, PROTIME in the last 168 hours. Cardiac Enzymes: No results for input(s): CKTOTAL, CKMB, CKMBINDEX, TROPONINI in the last 168 hours. BNP (last 3 results) No results for input(s): PROBNP in the last 8760 hours. HbA1C: No results for input(s): HGBA1C in the last 72 hours. CBG: Recent Labs  Lab 09/07/18 0446 09/07/18 0727 09/07/18 0751 09/07/18 0820 09/07/18 1109  GLUCAP 102* 59* 55* 172* 182*   Lipid Profile: No results for input(s):  CHOL, HDL, LDLCALC, TRIG, CHOLHDL, LDLDIRECT in the last 72 hours. Thyroid Function Tests: No results for input(s): TSH, T4TOTAL, FREET4, T3FREE, THYROIDAB in the last 72 hours. Anemia Panel: No results for input(s): VITAMINB12, FOLATE, FERRITIN, TIBC, IRON, RETICCTPCT in the last 72 hours. Sepsis Labs: No results for input(s): PROCALCITON, LATICACIDVEN in the last 168 hours.  No results found for this or any previous visit (from the past 240 hour(s)).       Radiology Studies: No results found.      Scheduled Meds: . enoxaparin (LOVENOX) injection  40 mg Subcutaneous Q24H  . insulin pump   Subcutaneous Q4H  . lisinopril  20 mg Oral Daily  . metoprolol tartrate  50 mg Oral BID   Continuous Infusions: . sodium chloride 1,000 mL (09/06/18 2245)  . erythromycin 250 mg (09/07/18 0610)  . famotidine (PEPCID) IV 20 mg (09/07/18 1025)     LOS: 0 days   Time spent= 25 mins    Ankit Arsenio Loader, MD [facility]  If 7PM-7AM, please contact night-coverage [email or web address] 09/07/2018, 12:23 PM

## 2018-09-08 DIAGNOSIS — R112 Nausea with vomiting, unspecified: Secondary | ICD-10-CM | POA: Diagnosis not present

## 2018-09-08 DIAGNOSIS — K3184 Gastroparesis: Secondary | ICD-10-CM | POA: Diagnosis not present

## 2018-09-08 DIAGNOSIS — D509 Iron deficiency anemia, unspecified: Secondary | ICD-10-CM | POA: Diagnosis not present

## 2018-09-08 DIAGNOSIS — E1021 Type 1 diabetes mellitus with diabetic nephropathy: Secondary | ICD-10-CM | POA: Diagnosis not present

## 2018-09-08 DIAGNOSIS — E876 Hypokalemia: Secondary | ICD-10-CM | POA: Diagnosis not present

## 2018-09-08 LAB — GLUCOSE, CAPILLARY
GLUCOSE-CAPILLARY: 94 mg/dL (ref 70–99)
GLUCOSE-CAPILLARY: 57 mg/dL — AB (ref 70–99)
GLUCOSE-CAPILLARY: 140 mg/dL — AB (ref 70–99)
Glucose-Capillary: 100 mg/dL — ABNORMAL HIGH (ref 70–99)
Glucose-Capillary: 131 mg/dL — ABNORMAL HIGH (ref 70–99)
Glucose-Capillary: 142 mg/dL — ABNORMAL HIGH (ref 70–99)
Glucose-Capillary: 71 mg/dL (ref 70–99)

## 2018-09-08 MED ORDER — DICYCLOMINE HCL 10 MG PO CAPS
10.0000 mg | ORAL_CAPSULE | Freq: Three times a day (TID) | ORAL | Status: DC
  Administered 2018-09-08 – 2018-09-09 (×4): 10 mg via ORAL
  Filled 2018-09-08 (×4): qty 1

## 2018-09-08 NOTE — Progress Notes (Signed)
PROGRESS NOTE    Kimberly [name]  QJJ:[id number] DOB: 07/21/1991 DOA: 09/06/2018 PCP: Foye Spurling, MD   Brief Narrative:  28 year old with history of type 1 diabetes on insulin pump, GERD, gastroparesis, sickle cell trait came to the hospital with complains of intractable nausea and vomiting.  Patient was admitted earlier this month with similar complaints and was in DKA at that time.  Eventually she was discharged on the seventh.  Now she returns with a similar complaints.  She was diagnosed with gastroparesis and admitted on IV erythromycin.   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  -LFTs and lipase are within normal limits - We will continue IV erythromycin every 6 hours, QTC appears to be stable at 459 -Diet as tolerated.  -Fluids if needed.  -Supportive care. -No advanced imaging for abdomen is necessary, abdominal exam is relatively benign. -EKG showed QTC 483 this morning, will monitor.   Essential hypertension, uncontrolled - Still little elevated but we will can continue home oral medication with IV hydralazine as needed.  Type I diabetes mellitus type 2 with diabetic nephropathy -Last hemoglobin A1c 08/31/2018-7.5 - Patient has insulin pump, diabetic coordinator consulted.  Leukocytosis -Possibly reactive?.  Closely monitor, no fever.  Hold off on antibiotics.  Microcytic anemia - Follow-up outpatient.  Hemoglobin stable without any obvious evidence of bleeding.  DVT prophylaxis: Subcutaneous Lovenox Code Status: Full code Family Communication: None at bedside Disposition Plan: Maintain inpatient stay for at least another 24-48 hours until oral intake  has improved.  Consultants:   Diabetic coordinator  Procedures:   None  Antimicrobials:   None   Subjective: Still reporting of intermittent nausea this morning.  Feels slightly better than yesterday but not back to herself yet.  She attempted to have some clear liquid yesterday and this morning.  Review of Systems Otherwise negative except as per HPI, including: General = no fevers, chills, dizziness, malaise, fatigue HEENT/EYES = negative for pain, redness, loss of vision, double vision, blurred vision, loss of hearing, sore throat, hoarseness, dysphagia Cardiovascular= negative for chest pain, palpitation, murmurs, lower extremity swelling Respiratory/lungs= negative for shortness of breath, cough, hemoptysis, wheezing, mucus production Gastrointestinal= negative for  abdominal pain, melena, hematemesis Genitourinary= negative for Dysuria, Hematuria, Change in Urinary Frequency MSK = Negative for arthralgia, myalgias, Back Pain, Joint swelling  Neurology= Negative for headache, seizures, numbness, tingling  Psychiatry= Negative for anxiety, depression, suicidal and homocidal ideation Allergy/Immunology= Medication/Food allergy as listed  Skin= Negative for Rash, lesions, ulcers, itching   Objective: Vitals:   09/07/18 0822 09/07/18 1607 09/07/18 2011 09/08/18 0923  BP: (!) 151/92 (!) 165/99 (!) 162/96 (!) 165/99  Pulse: 91 90 94 90  Resp: 17 18 18 18   Temp: 98.5 F (36.9 C) 98.5 F (36.9 C) 98.6 F (37 C) 98.1 F (36.7 C)  TempSrc: Oral Oral Oral Oral  SpO2: 100% 100% 100% 100%  Weight:   73.4 kg   Height:        Intake/Output Summary (Last 24 hours) at 09/08/2018 1157 Last data filed at 09/08/2018 0900 Gross per 24 hour  Intake 2160.71 ml  Output 650 ml  Net 1510.71 ml   Filed Weights   09/06/18 1326 09/07/18 2011  Weight: 73.5 kg 73.4 kg    Examination: Constitutional: mild distress due to feeling of nausea.  Eyes: PERRL, lids and conjunctivae  normal ENMT: Mucous membranes are DRY Posterior pharynx clear of any exudate or lesions.Normal dentition.  Neck: normal, supple, no masses, no thyromegaly Respiratory: clear to auscultation bilaterally, no wheezing, no crackles. Normal respiratory effort. No accessory muscle use.  Cardiovascular: Regular rate and rhythm, no murmurs / rubs / gallops. No extremity edema. 2+ pedal pulses. No carotid bruits.  Abdomen: no tenderness, no masses palpated. No hepatosplenomegaly. Bowel sounds positive.  Musculoskeletal: no clubbing / cyanosis. No joint deformity upper and lower extremities. Good ROM, no contractures. Normal muscle tone.  Skin: no rashes, lesions, ulcers. No induration Neurologic: CN 2-12 grossly intact. Sensation intact, DTR normal. Strength 5/5 in all 4.  Psychiatric: Normal judgment and insight. Alert and oriented x 3. Normal mood.   Data Reviewed:   CBC: Recent Labs  Lab 09/06/18 1345 09/07/18 0730  WBC 18.0* 13.4*  HGB 11.7* 10.0*  HCT 34.0* 29.1*  MCV 74.2* 74.6*  PLT 315 294   Basic Metabolic Panel: Recent Labs  Lab 09/02/18 0523 09/06/18 1345 09/07/18 0730  NA 142 142 145  K 3.5 3.4* 3.8  CL 112* 107 116*  CO2 20* 21* 21*  GLUCOSE 172* 69* 63*  BUN 10 10 8   CREATININE 1.08* 1.17* 1.18*  CALCIUM 8.4* 9.4 8.5*   GFR: Estimated Creatinine Clearance: 70.6 mL/min (A) (by C-G formula based on SCr of 1.18 mg/dL (H)). Liver Function Tests: Recent Labs  Lab 09/06/18 1345  AST 22  ALT 14  ALKPHOS 68  BILITOT 0.4  PROT 7.1  ALBUMIN 3.3*   Recent Labs  Lab 09/06/18 1345  LIPASE 22   No results for input(s): AMMONIA in the last 168 hours. Coagulation Profile: No results for input(s): INR, PROTIME in the last 168 hours. Cardiac Enzymes: No results for input(s): CKTOTAL, CKMB, CKMBINDEX, TROPONINI in the last 168 hours. BNP (last 3 results) No results for input(s): PROBNP in the last 8760 hours. HbA1C: No results for input(s): HGBA1C in the last 72  hours. CBG: Recent Labs  Lab 09/07/18 2009 09/08/18 0011 09/08/18 0336 09/08/18 0732 09/08/18 1118  GLUCAP 170* 142* 131* 100* 94   Lipid Profile: No results for input(s): CHOL, HDL, LDLCALC, TRIG, CHOLHDL, LDLDIRECT in the last 72 hours. Thyroid Function Tests: No results for input(s): TSH, T4TOTAL, FREET4, T3FREE, THYROIDAB in the last 72 hours. Anemia Panel: No results for input(s): VITAMINB12, FOLATE, FERRITIN, TIBC, IRON, RETICCTPCT in the last 72 hours. Sepsis Labs: No results for input(s): PROCALCITON, LATICACIDVEN in the last 168 hours.  No results found for this or any previous visit (from the past 240 hour(s)).       Radiology Studies: No results found.      Scheduled Meds: . enoxaparin (LOVENOX) injection  40 mg Subcutaneous Q24H  . insulin pump   Subcutaneous Q4H  . lisinopril  20 mg Oral Daily  . metoprolol tartrate  50 mg Oral BID   Continuous Infusions: . sodium chloride 75 mL/hr at 09/08/18 0332  . erythromycin 250 mg (09/08/18 0603)  . famotidine (PEPCID) IV 20 mg (09/08/18 1050)     LOS: 0 days   Time spent= 25 mins    Yashar Inclan Arsenio Loader, MD [facility]  If 7PM-7AM, please contact night-coverage [email or web address] 09/08/2018, 11:57 AM

## 2018-09-08 NOTE — Progress Notes (Signed)
CBG was 57 at shift changed. Juice was given. CBG was rechecked and went up to 140. Will continue to monitor.   Eleanora Neighbor, RN

## 2018-09-09 DIAGNOSIS — R112 Nausea with vomiting, unspecified: Secondary | ICD-10-CM | POA: Diagnosis not present

## 2018-09-09 LAB — COMPREHENSIVE METABOLIC PANEL
ALT: 15 U/L (ref 0–44)
ANION GAP: 6 (ref 5–15)
AST: 12 U/L — ABNORMAL LOW (ref 15–41)
Albumin: 2.3 g/dL — ABNORMAL LOW (ref 3.5–5.0)
Alkaline Phosphatase: 79 U/L (ref 38–126)
BUN: <5 mg/dL — ABNORMAL LOW (ref 6–20)
CO2: 23 mmol/L (ref 22–32)
Calcium: 8 mg/dL — ABNORMAL LOW (ref 8.9–10.3)
Chloride: 111 mmol/L (ref 98–111)
Creatinine, Ser: 1.11 mg/dL — ABNORMAL HIGH (ref 0.44–1.00)
GFR calc Af Amer: >60 mL/min (ref >60)
GFR calc non Af Amer: >60 mL/min (ref >60)
Glucose, Bld: 68 mg/dL — ABNORMAL LOW (ref 70–99)
Potassium: 3.4 mmol/L — ABNORMAL LOW (ref 3.5–5.1)
Sodium: 140 mmol/L (ref 135–145)
Total Bilirubin: 0.5 mg/dL (ref 0.3–1.2)
Total Protein: 5 g/dL — ABNORMAL LOW (ref 6.5–8.1)

## 2018-09-09 LAB — GLUCOSE, CAPILLARY
Glucose-Capillary: 151 mg/dL — ABNORMAL HIGH (ref 70–99)
Glucose-Capillary: 43 mg/dL — CL (ref 70–99)
Glucose-Capillary: 84 mg/dL (ref 70–99)
Glucose-Capillary: 94 mg/dL (ref 70–99)
Glucose-Capillary: 95 mg/dL (ref 70–99)

## 2018-09-09 LAB — CBC
HCT: 30.2 % — ABNORMAL LOW (ref 36.0–46.0)
Hemoglobin: 10.4 g/dL — ABNORMAL LOW (ref 12.0–15.0)
MCH: 25.6 pg — ABNORMAL LOW (ref 26.0–34.0)
MCHC: 34.4 g/dL (ref 30.0–36.0)
MCV: 74.4 fL — ABNORMAL LOW (ref 80.0–100.0)
Platelets: 288 10*3/uL (ref 150–400)
RBC: 4.06 MIL/uL (ref 3.87–5.11)
RDW: 13.2 % (ref 11.5–15.5)
WBC: 7.6 10*3/uL (ref 4.0–10.5)
nRBC: 0 % (ref 0.0–0.2)

## 2018-09-09 LAB — MAGNESIUM: MAGNESIUM: 1.6 mg/dL — AB (ref 1.7–2.4)

## 2018-09-09 MED ORDER — POTASSIUM CHLORIDE CRYS ER 20 MEQ PO TBCR
40.0000 meq | EXTENDED_RELEASE_TABLET | ORAL | Status: AC
  Administered 2018-09-09 (×2): 40 meq via ORAL
  Filled 2018-09-09 (×2): qty 2

## 2018-09-09 MED ORDER — FAMOTIDINE 20 MG PO TABS
20.0000 mg | ORAL_TABLET | Freq: Two times a day (BID) | ORAL | Status: DC
  Administered 2018-09-09: 20 mg via ORAL
  Filled 2018-09-09: qty 1

## 2018-09-09 NOTE — Discharge Summary (Signed)
Physician Discharge Summary  Kimberly [name] [id number] DOB: 06-Nov-1990 DOA: 09/06/2018  PCP: Foye Spurling, MD  Admit date: 09/06/2018 Discharge date: 09/09/2018  Admitted From: Home  Disposition:  Home   Recommendations for Outpatient Follow-up:  1. Follow up with PCP in 1-2 weeks 2. Please obtain BMP/CBC in one week your next doctors visit.  3. Follow up with Endocrinology and GI within one month  4. Advised to keep a log of blood glucose at home.   Home Health: None  Equipment/Devices: None Discharge Condition: Stable CODE STATUS: Full code Diet recommendation: Diabetic  Brief/Interim Summary: 28 year old with history of type 1 diabetes on insulin pump, GERD, gastroparesis, sickle cell trait came to the hospital with complains of intractable nausea and vomiting.  Patient was admitted earlier this month with similar complaints and was in DKA at that time.  Eventually she was discharged on the seventh.  Now she returns with a similar complaints.  She was diagnosed with gastroparesis and admitted on IV erythromycin.  Hospital course of 48 hours, patient was tolerating oral diet and did not have any further episode of nausea. Today's he is deemed stable to be discharged with outpatient follow-up recommendations as stated above.  Have advised her to keep a log of blood glucose checks at home so further adjustments can be made by her outpatient endocrinologist as necessary.  I also advised her to eat several small meals throughout the day to keep her blood sugar stable.  And if necessary she also needs to keep sugar candy on her if necessary. Stable for discharge today.   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  She will need to follow-up outpatient with endocrinology and gastroenterology in about 1 month.  Previously has not tolerated Reglan due to tardive dyskinesia.  During the hospitalization she gets treated with IV erythromycin.  Essential hypertension, uncontrolled - Resume home medications.  She needs to keep a log of this at home, if still remains elevated- adjustments will be needed which I will defer to primary care provider.  Type I diabetes mellitus type 2 with diabetic nephropathy -Last hemoglobin A1c 08/31/2018-7.5 - His insulin pump in place.  [place] outpatient endocrinology.  Leukocytosis -Resolved  Microcytic anemia - Follow-up outpatient.  Hemoglobin stable without any obvious evidence of bleeding.  Subcutaneous Lovenox during hospitalization Full code Discharge today.  Discharge Instructions   Allergies as of 09/09/2018   No Known Allergies     Medication List    TAKE these medications   BAYER CONTOUR NEXT TEST test strip Generic drug:  glucose blood USE AS DIRECTED TO TEST 6 TO 8 TIMES DAILY   CONTOUR NEXT EZ MONITOR w/Device Kit 1 each by Does not apply route 6 (six) times daily. Provide lancets for testing six times daily. Type I DM with diabetes on Insulin Pump  DX 648.03   erythromycin ethylsuccinate 200 MG/5ML suspension Commonly known as:  EES Take 1.3 mLs (52 mg total) by mouth 4 (four) times daily -  with meals and at bedtime for 14 days.   famotidine 20 MG tablet Commonly known as:  PEPCID Take 1 tablet (20 mg total) by mouth 2 (two)  times daily.   insulin lispro 100 UNIT/ML injection Commonly known as:  HUMALOG Inject into the skin 3 (three) times daily before meals. Via insulin pump   insulin pump Soln Inject into the skin. Humalog   lisinopril 20 MG tablet Commonly known as:  PRINIVIL,ZESTRIL Take 1 tablet (20 mg total) by mouth daily.    metoprolol tartrate 50 MG tablet Commonly known as:  LOPRESSOR Take 1 tablet (50 mg total) by mouth 2 (two) times daily.   NEXPLANON 68 MG Impl implant Generic drug:  etonogestrel Inject 1 each into the skin once.   ondansetron 4 MG tablet Commonly known as:  ZOFRAN Take 1 tablet (4 mg total) by mouth every 6 (six) hours.      Follow-up Information    Foye Spurling, MD. Schedule an appointment as soon as possible for a visit in 1 week(s).   Specialty:  Internal Medicine Contact information: [address] [place] [address] [phone]          No Known Allergies  You were cared for by a hospitalist during your hospital stay. If you have any questions about your discharge medications or the care you received while you were in the hospital after you are discharged, you can call the unit and asked to speak with the hospitalist on call if the hospitalist that took care of you is not available. Once you are discharged, your primary care physician will handle any further medical issues. Please note that no refills for any discharge medications will be authorized once you are discharged, as it is imperative that you return to your primary care physician (or establish a relationship with a primary care physician if you do not have one) for your aftercare needs so that they can reassess your need for medications and monitor your lab values.  Consultations:  Diabetic coordinator   Procedures/Studies: Dg Abd Acute W/chest  Result Date: 08/29/2018 CLINICAL DATA:  Abdomen pain for 2 days. EXAM: DG ABDOMEN ACUTE W/ 1V CHEST COMPARISON:  Chest x-ray March 19, 2018 FINDINGS: There is no evidence of dilated bowel loops or free intraperitoneal air. Bowel content is identified throughout colon. Pelvic phleboliths are noted. Heart size and mediastinal contours are within normal limits. Both lungs are clear. IMPRESSION: No bowel obstruction. Bowel content is identified  throughout: Suggesting constipation. No acute cardiopulmonary disease. Electronically Signed   By: Abelardo Diesel M.D.   On: 08/29/2018 14:39   Nm Gastric Emptying Solid Liquid Both W/sb Transit  Result Date: 09/03/2018 CLINICAL DATA:  Nausea, vomiting, early satiety, diabetes. EXAM: NUCLEAR MEDICINE GASTRIC EMPTYING SCAN TECHNIQUE: After oral ingestion of radiolabeled meal, sequential abdominal images were obtained for 4 hours. Percentage of activity emptying the stomach was calculated at 1 hour, 2 hour, 3 hour, and 4 hours. RADIOPHARMACEUTICALS:  2.1 mCi Tc-47msulfur colloid in standardized meal COMPARISON:  CT abdomen 03/19/2018 FINDINGS: Expected location of the stomach in the left upper quadrant. Ingested meal empties the stomach gradually over the course of the study. 26.8% emptied at 1 hr ( normal >= 10%) 49.8% emptied at 2 hr ( normal >= 40%) 77.7% emptied at 3 hr ( normal >= 70%) 88.4% emptied at 4 hr ( normal >= 90%) IMPRESSION: Mild delayed gastric emptying revealed at the 4 hour time point (normal up to 3 hours). Electronically Signed   By: SSuzy BouchardM.D.   On: 09/03/2018 17:04      Subjective: No complaints, feels better.  Denies any nausea, tolerating  oral diet. Had episode of slight hypoglycemia this morning which resolved with orange juice.  Discharge Exam: Vitals:   09/09/18 0509 09/09/18 0909  BP: 119/90 (!) 143/96  Pulse: 80 91  Resp: 18 18  Temp: 98.3 F (36.8 C) 97.7 F (36.5 C)  SpO2: 98% 100%   Vitals:   09/08/18 1940 09/08/18 2048 09/09/18 0509 09/09/18 0909  BP: (!) 161/98  119/90 (!) 143/96  Pulse: 87  80 91  Resp:   18 18  Temp:   98.3 F (36.8 C) 97.7 F (36.5 C)  TempSrc:   Oral Oral  SpO2:   98% 100%  Weight:  65.9 kg    Height:        General: Pt is alert, awake, not in acute distress Cardiovascular: RRR, S1/S2 +, no rubs, no gallops Respiratory: CTA bilaterally, no wheezing, no rhonchi Abdominal: Soft, NT, ND, bowel sounds + Extremities:  no edema, no cyanosis    The results of significant diagnostics from this hospitalization (including imaging, microbiology, ancillary and laboratory) are listed below for reference.     Microbiology: No results found for this or any previous visit (from the past 240 hour(s)).   Labs: BNP (last 3 results) No results for input(s): BNP in the last 8760 hours. Basic Metabolic Panel: Recent Labs  Lab 09/06/18 1345 09/07/18 0730 09/09/18 0555  NA 142 145 140  K 3.4* 3.8 3.4*  CL 107 116* 111  CO2 21* 21* 23  GLUCOSE 69* 63* 68*  BUN 10 8 <5*  CREATININE 1.17* 1.18* 1.11*  CALCIUM 9.4 8.5* 8.0*  MG  --   --  1.6*   Liver Function Tests: Recent Labs  Lab 09/06/18 1345 09/09/18 0555  AST 22 12*  ALT 14 15  ALKPHOS 68 79  BILITOT 0.4 0.5  PROT 7.1 5.0*  ALBUMIN 3.3* 2.3*   Recent Labs  Lab 09/06/18 1345  LIPASE 22   No results for input(s): AMMONIA in the last 168 hours. CBC: Recent Labs  Lab 09/06/18 1345 09/07/18 0730 09/09/18 0555  WBC 18.0* 13.4* 7.6  HGB 11.7* 10.0* 10.4*  HCT 34.0* 29.1* 30.2*  MCV 74.2* 74.6* 74.4*  PLT 315 292 288   Cardiac Enzymes: No results for input(s): CKTOTAL, CKMB, CKMBINDEX, TROPONINI in the last 168 hours. BNP: Invalid input(s): POCBNP CBG: Recent Labs  Lab 09/08/18 2059 09/09/18 0039 09/09/18 0320 09/09/18 0722 09/09/18 0747  GLUCAP 140* 95 94 43* 84   D-Dimer No results for input(s): DDIMER in the last 72 hours. Hgb A1c No results for input(s): HGBA1C in the last 72 hours. Lipid Profile No results for input(s): CHOL, HDL, LDLCALC, TRIG, CHOLHDL, LDLDIRECT in the last 72 hours. Thyroid function studies No results for input(s): TSH, T4TOTAL, T3FREE, THYROIDAB in the last 72 hours.  Invalid input(s): FREET3 Anemia work up No results for input(s): VITAMINB12, FOLATE, FERRITIN, TIBC, IRON, RETICCTPCT in the last 72 hours. Urinalysis    Component Value Date/Time   COLORURINE YELLOW 09/06/2018 1328    APPEARANCEUR HAZY (A) 09/06/2018 1328   LABSPEC 1.012 09/06/2018 1328   PHURINE 7.0 09/06/2018 1328   GLUCOSEU >=500 (A) 09/06/2018 1328   HGBUR SMALL (A) 09/06/2018 1328   BILIRUBINUR NEGATIVE 09/06/2018 1328   KETONESUR 20 (A) 09/06/2018 1328   PROTEINUR >=300 (A) 09/06/2018 1328   UROBILINOGEN 0.2 03/21/2015 2010   NITRITE NEGATIVE 09/06/2018 1328   LEUKOCYTESUR NEGATIVE 09/06/2018 1328   Sepsis Labs Invalid input(s): PROCALCITONIN,  WBC,  LACTICIDVEN Microbiology No results found  for this or any previous visit (from the past 240 hour(s)).   Time coordinating discharge:  I have spent 35 minutes face to face with the patient and on the ward discussing the patients care, assessment, plan and disposition with other care givers. >50% of the time was devoted counseling the patient about the risks and benefits of treatment/Discharge disposition and coordinating care.   SIGNED:   Damita Lack, MD  [facility] 09/09/2018, 10:51 AM Pager   If 7PM-7AM, please contact night-coverage [email or web address] Password [email or web address]

## 2018-09-09 NOTE — Progress Notes (Signed)
Patient Discharge: Disposition: Patient discharged to home. Education: Reviewed the medications, prescriptions, follow-up appointments and discharge instructions, verbalized understanding. IV: Discontinued IV before discharge. Telemetry: N/A Transportation: Patient escorted out of the unit in w/c. Belongings: Patient took all her belongings with her.

## 2018-09-10 DIAGNOSIS — E1043 Type 1 diabetes mellitus with diabetic autonomic (poly)neuropathy: Secondary | ICD-10-CM | POA: Insufficient documentation

## 2018-09-11 DIAGNOSIS — N179 Acute kidney failure, unspecified: Secondary | ICD-10-CM | POA: Insufficient documentation

## 2018-09-11 DIAGNOSIS — R7989 Other specified abnormal findings of blood chemistry: Secondary | ICD-10-CM | POA: Insufficient documentation

## 2018-09-11 DIAGNOSIS — R778 Other specified abnormalities of plasma proteins: Secondary | ICD-10-CM | POA: Insufficient documentation

## 2018-10-09 ENCOUNTER — Encounter: Payer: Self-pay | Admitting: Gastroenterology

## 2018-10-09 ENCOUNTER — Ambulatory Visit (INDEPENDENT_AMBULATORY_CARE_PROVIDER_SITE_OTHER): Payer: BLUE CROSS/BLUE SHIELD | Admitting: Gastroenterology

## 2018-10-09 VITALS — BP 158/94 | HR 85 | Ht 65.0 in | Wt 146.2 lb

## 2018-10-09 DIAGNOSIS — K3184 Gastroparesis: Secondary | ICD-10-CM | POA: Diagnosis not present

## 2018-10-09 DIAGNOSIS — R112 Nausea with vomiting, unspecified: Secondary | ICD-10-CM | POA: Diagnosis not present

## 2018-10-09 MED ORDER — ERYTHROMYCIN STEARATE 250 MG PO TABS: 250.0000 mg | ORAL_TABLET | Freq: Three times a day (TID) | ORAL | 0 refills | Status: AC

## 2018-10-09 NOTE — Progress Notes (Signed)
P  Chief Complaint:    Nausea/vomiting, hospital follow-up, Gastroparesis  GI History: 28 year old female with a history of type 1 diabetes on insulin pump, sickle cell trait, GERD, gastroparesis, recently admitted January 9-12 for nausea/vomiting.  She was successfully treated with trial of IV erythromycin, with resolution of her nausea/vomiting.  History of tardive dyskinesia with Reglan use in the past.  GES on that admission notable for mild delayed gastric emptying at the 4-hour time point (88.4% emptied).  She was discharged with erythromycin p.o. 250 mg 3 times daily x4 weeks, with plan for "drug holiday" due to risk of tachyphylaxis.  Did not take this as an outpatient.  She was readmitted on January 13-22 at [facility] in [place], [place] for ongoing nausea/vomiting and DKA.  EGD on 09/14/2018 did not show any ulcers or GOO.  GES showed significant delay on that admission (1 hour 8% emptying, 2 hours 24% emptying, 4 hours 46% emptying).  She was again started on erythromycin 250 mg p.o. 3 times daily.  Elevated troponin to 5.4 presumed 2/2 dehydration and AKI.  TTE with EF 60 to 70% and no wall motion abnormalities.  HPI:     Patient is a 28 y.o. female presenting to the [facility]ology [facility] for hospital follow-up.  Today she states sxs are well controlled with E-mycin TID, but had only prescribed 14 days worth of medication. However, Rx is cost prohibitive ($600 for 14-day supply w insurance). Just finished course on Friday. Still with abdominal fullness and bloating, occurs intermittently and improves with BM. No hx of constipation, diarrhea, GIB.  Otherwise feels well today.  For her DM, insulin pump and humalog. BG ranging 130-150 at home. Follows with Endocrinology, last seen end of January without any changes in medications at that time.   Review of systems:     No chest pain, no SOB, no fevers, no urinary sx   Past Medical History:  Diagnosis Date  .  Diabetes mellitus without complication ([place])   . Preeclampsia   . Preeclampsia without severe features   . Sickle cell trait ([place])     Patient's surgical history, family medical history, social history, medications and allergies were all reviewed in Epic    Current Outpatient Medications  Medication Sig Dispense Refill  . BAYER CONTOUR NEXT TEST test strip USE AS DIRECTED TO TEST 6 TO 8 TIMES DAILY 1 each 2  . Blood Glucose Monitoring Suppl (CONTOUR NEXT EZ MONITOR) W/DEVICE KIT 1 each by Does not apply route 6 (six) times daily. Provide lancets for testing six times daily. Type I DM with diabetes on Insulin Pump  DX 648.03 1 kit 6  . erythromycin (E-MYCIN) 250 MG tablet 1 tablet 3 (three) times daily.    Marland Kitchen etonogestrel (NEXPLANON) 68 MG IMPL implant Inject 1 each into the skin once.    . gabapentin (NEURONTIN) 100 MG capsule 1 capsule 2 (two) times daily.    . Insulin Human (INSULIN PUMP) SOLN Inject into the skin. Humalog    . insulin lispro (HUMALOG) 100 UNIT/ML injection Inject into the skin 3 (three) times daily before meals. Via insulin pump    . lisinopril (PRINIVIL,ZESTRIL) 20 MG tablet Take 1 tablet (20 mg total) by mouth daily. 30 tablet 0  . methylPREDNISolone (MEDROL DOSEPAK) 4 MG TBPK tablet daily.    . metoprolol tartrate (LOPRESSOR) 50 MG tablet Take 1 tablet (50 mg total) by mouth 2 (two) times daily. 60 tablet 0   No current  facility-administered medications for this visit.     Physical Exam:     Ht 5' 5"  (1.651 m)   Wt 146 lb 4 oz (66.3 kg)   BMI 24.34 kg/m   GENERAL:  Pleasant female in NAD PSYCH: : Cooperative, normal affect EENT:  conjunctiva pink, mucous membranes moist, neck supple without masses CARDIAC:  RRR, no murmur heard, no peripheral edema PULM: Normal respiratory effort, lungs CTA bilaterally, no wheezing ABDOMEN:  Nondistended, soft, nontender. No obvious masses, no hepatomegaly,  normal bowel sounds SKIN:  turgor, no lesions  seen Musculoskeletal:  Normal muscle tone, normal strength NEURO: Alert and oriented x 3, no focal neurologic deficits   IMPRESSION and PLAN:    #1. Gastroparesis: Diabetic gastroparesis with significant impairment noted on GES at outside facility.  Tardive dyskinesia with previous Reglan use.  Symptoms do respond to trials of erythromycin, but this is become cost prohibitive to the patient.  - Nutrition referral - Low fat, low fiber diet -Close follow-up with Endocrinology for optimal control of diabetes - Referral to Dr.  Derrill Kay at [facility] for DM gastroparesis and consideration of Domperidone trial or other investigational therapies (i.e. cisapride) versus gastric stim placement -For now, continue erythromycin 250 mg PO TID x4 weeks. Will eval for ways to reduce cost today -RTC in 2 to 3 months or sooner as needed  #2.  Nausea/Vomiting: Secondary to the above diabetic gastroparesis.  Treating as above.  #3.  Diabetes: Follows closely with endocrinology.  Nutrition consult placed to assist with both DM and above gastroparesis.    I spent a total of 15 minutes of face-to-face time with the patient. Greater than 50% of the time was spent counseling and coordinating care.   [place] ,DO, FACG 10/09/2018, 8:45 AM

## 2018-10-09 NOTE — Patient Instructions (Signed)
If you are age 30 or older, your body mass index should be between 23-30. Your Body mass index is 24.34 kg/m. If this is out of the aforementioned range listed, please consider follow up with your Primary Care Provider.  If you are age 10 or younger, your body mass index should be between 19-25. Your Body mass index is 24.34 kg/m. If this is out of the aformentioned range listed, please consider follow up with your Primary Care Provider.   We have sent the following medications to your pharmacy for you to pick up at your convenience: Erythromycin 250 mg by mouth three times a day for 4 weeks.  We have sent a referral to a [facility] will be receiving a call to schedule an appointment with them soon. We are also sending you a referral to [facility] where you will be receiving a call to schedule an appointment soon.   Low-Fiber Eating Plan Fiber is found in fruits, vegetables, whole grains, and beans. Eating a diet low in fiber helps to reduce how often you have bowel movements and how much you produce during a bowel movement. A low-fiber eating plan may help your digestive system heal if:  You have certain conditions, such as Crohn's disease or diverticulitis.  You recently had radiation therapy on your pelvis or bowel.  You recently had intestinal surgery.  You have a new surgical opening in your abdomen (colostomy or ileostomy).  Your intestine is narrowed (stricture). Your health care provider will determine how long you need to stay on this diet. Your health care provider may recommend that you work with a diet and nutrition specialist (dietitian). What are tips for following this plan? General guidelines  Follow recommendations from your dietitian about how much fiber you should have each day.  Most people on this eating plan should try to eat less than 10 grams (g) of fiber each day.  Take vitamin and mineral supplements as told by your health care  provider or dietitian. Chewable or liquid forms are best when on this eating plan. Reading food labels  Check food labels for the amount of dietary fiber.  Choose foods that have less than 2 grams of fiber in one serving. Cooking  Use white flour and other allowed grains for baking and cooking.  Cook meat using methods that keep it tender, such as braising or poaching.  Cook eggs until the yolk is completely solid.  Cook with healthy oils, such as olive oil or canola oil. Meal planning   Eat 5-6 small meals throughout the day instead of 3 large meals.  If you are lactose intolerant: ? Choose low-lactose dairy foods. ? Do not eat dairy foods, if told by your dietitian.  Limit fat and oils to less than 8 teaspoons a day.  Eat small portions of desserts. What foods are allowed? The items listed below may not be a complete list. Talk with your dietitian about what dietary choices are best for you. Grains All bread and crackers made with white flour. Waffles, pancakes, and [place] toast. Bagels. Pretzels. Melba toast, zwieback, and matzoh. Cooked and dried cereals that do not contain whole grains, added fiber, seeds, or dried fruit. CornmealDomenick Gong. Hot and cold cereals made with refined corn, wheat, rice, or oats. Plain pasta and noodles. White rice. Vegetables Well-cooked or canned vegetables without skin, seeds, or stems. Cooked potatoes without skins. Vegetable juice. Fruits Soft-cooked or canned fruits without skin and seeds. Peeled ripe banana.  Applesauce. Fruit juice without pulp. Meats and other protein foods Ground meat. Tender cuts of meat or poultry. Eggs. Fish, seafood, and shellfish. Smooth nut butters. Tofu. Dairy All milk products and drinks. Lactose-free milks, including rice, soy, and almond milks. Yogurt without fruit, nuts, chocolate, or granola mix-ins. Sour cream. Cottage cheese. Cheese. Beverages Decaf coffee. Fruit and vegetable juices or smoothies (in small  amounts, with no pulp or skins, and with fruits from allowed list). Sports drinks. Herbal tea. Fats and oils Olive oil, canola oil, sunflower oil, flaxseed oil, and grapeseed oil. Mayonnaise. Cream cheese. Margarine. Butter. Sweets and desserts Plain cakes and cookies. Cream pies and pies made with allowed fruits. Pudding. Custard. Fruit gelatin. Sherbet. Popsicles. Ice cream without nuts. Plain hard candy. Honey. Jelly. Molasses. Syrups, including chocolate syrup. Chocolate. Marshmallows. Gumdrops. Seasoning and other foods Bouillon. Broth. Cream soups made from allowed foods. Strained soup. Casseroles made with allowed foods. Ketchup. Mild mustard. Mild salad dressings. Plain gravies. Vinegar. Spices in moderation. Salt. Sugar. What foods are not allowed? The items listed below may not be a complete list. Talk with your dietitian about what dietary choices are best for you. Grains Whole wheat and whole grain breads and crackers. Multigrain breads and crackers. Rye bread. Whole grain or multigrain cereals. Cereals with nuts, raisins, or coconut. Bran. Coarse wheat cereals. Granola. High-fiber cereals. Cornmeal or corn bread. Whole grain pasta. Wild or brown rice. Quinoa. Popcorn. Buckwheat. Wheat germ. Vegetables Potato skins. Raw or undercooked vegetables. All beans and bean sprouts. Cooked greens. Corn. Peas. Cabbage. Beets. Broccoli. Brussels sprouts. Cauliflower. Mushrooms. Onions. Peppers. Parsnips. Okra. Sauerkraut. Fruit Raw or dried fruit. Berries. Fruit juice with pulp. Prune juice. Meats and other protein foods Tough, fibrous meats with gristle. Fatty meat. Poultry with skin. Fried meat, Sales executive, or fish. Deli or lunch meats. Sausage, bacon, and hot dogs. Nuts and chunky nut butter. Dried peas, beans, and lentils. Dairy Yogurt with fruit, nuts, chocolate, or granola mix-ins. Beverages Caffeinated coffee and teas. Fats and oils Avocado. Coconut. Sweets and desserts Desserts,  cookies, or candies that contain nuts or coconut. Dried fruit. Jams and preserves with seeds. Marmalade. Any dessert made with fruits or grains that are not allowed. Seasoning and other foods Corn tortilla chips. Soups made with vegetables or grains that are not allowed. Relish. Horseradish. Angie Fava. Olives. Summary  Most people on a low-fiber eating plan should eat less than 10 grams of fiber a day. Follow recommendations from your dietitian about how much fiber you should have each day.  Always check food labels to see the dietary fiber content of packaged foods. In general, a low-fiber food will have fewer than 2 grams of fiber per serving.  In general, try to avoid whole grains, raw fruits and vegetables, dried fruit, tough cuts of meat, nuts, and seeds.  Take a vitamin and mineral supplement as told by your health care provider or dietitian. This information is not intended to replace advice given to you by your health care provider. Make sure you discuss any questions you have with your health care provider. Document Released: 02/04/2002 Document Revised: 10/18/2016 Document Reviewed: 10/18/2016 Elsevier Interactive Patient Education  2019 [place] Fat Diet General guidelines  Limit your fat intake to less than 30% of your total daily calories. If you eat around 1,800 calories each day, this is less than 60 grams (g) of fat per day.  Fat is an important part of a healthy diet. Eating a low-fat diet can make it  hard to maintain a healthy body weight. Ask your dietitian how much fat, calories, and other nutrients you need each day.  Eat small, frequent meals throughout the day instead of three large meals.  Drink at least 8-10 cups of fluid a day. Drink enough fluid to keep your urine clear or pale yellow.  Limit alcohol intake to no more than 1 drink a day for nonpregnant women and 2 drinks a day for men. One drink equals 12 oz of beer, 5 oz of wine, or 1 oz of hard  liquor. Reading food labels  Check Nutrition Facts on food labels for the amount of fat per serving. Choose foods with less than 3 grams of fat per serving. Shopping  Choose nonfat and low-fat healthy foods. Look for the words "nonfat," "low fat," or "fat free."  Avoid buying processed or prepackaged foods. Cooking  Cook using low-fat methods, such as baking, broiling, grilling, or boiling.  Cook with small amounts of healthy fats, such as olive oil, grapeseed oil, canola oil, or sunflower oil. What foods are recommended?   All fresh, frozen, or canned fruits and vegetables.  Whole grains.  Low-fat or non-fat (skim) milk and yogurt.  Lean meat, skinless poultry, fish, eggs, and beans.  Low-fat protein supplement powders or drinks.  Spices and herbs. What foods are not recommended?  High-fat foods. These include baked goods, fast food, fatty cuts of meat, ice cream, french toast, sweet rolls, pizza, cheese bread, foods covered with butter, creamy sauces, or cheese.  Fried foods. These include french fries, tempura, battered fish, breaded chicken, fried breads, and sweets.  Foods with strong odors.  Foods that cause bloating and gas. Summary  A low-fat diet can be helpful if you have a gallbladder condition, or before and after gallbladder surgery.  Limit your fat intake to less than 30% of your total daily calories. This is about 60 g of fat if you eat 1,800 calories each day.  Eat small, frequent meals throughout the day instead of three large meals. This information is not intended to replace advice given to you by your health care provider. Make sure you discuss any questions you have with your health care provider. Document Released: 08/20/2013 Document Revised: 09/22/2016 Document Reviewed: 09/22/2016 Elsevier Interactive Patient Education  2019 [employer].

## 2019-08-10 ENCOUNTER — Other Ambulatory Visit: Payer: Self-pay

## 2019-08-10 ENCOUNTER — Emergency Department (HOSPITAL_COMMUNITY)
Admission: EM | Admit: 2019-08-10 | Discharge: 2019-08-10 | Disposition: A | Discharge status: Home / Self Care | Payer: BLUE CROSS/BLUE SHIELD | Attending: Emergency Medicine | Admitting: Emergency Medicine

## 2019-08-10 ENCOUNTER — Encounter (HOSPITAL_COMMUNITY): Payer: Self-pay | Admitting: Emergency Medicine

## 2019-08-10 DIAGNOSIS — R6884 Jaw pain: Secondary | ICD-10-CM | POA: Diagnosis not present

## 2019-08-10 DIAGNOSIS — E109 Type 1 diabetes mellitus without complications: Secondary | ICD-10-CM | POA: Diagnosis not present

## 2019-08-10 DIAGNOSIS — Z79899 Other long term (current) drug therapy: Secondary | ICD-10-CM | POA: Insufficient documentation

## 2019-08-10 DIAGNOSIS — M542 Cervicalgia: Secondary | ICD-10-CM | POA: Diagnosis not present

## 2019-08-10 MED ORDER — TRAMADOL HCL 50 MG PO TABS: 50.0000 mg | ORAL_TABLET | Freq: Four times a day (QID) | ORAL | 0 refills | Status: DC | PRN

## 2019-08-10 NOTE — ED Provider Notes (Signed)
[facility] EMERGENCY DEPARTMENT Provider Note   CSN: [id number] Arrival date & time: 08/10/19  1750     History Chief Complaint  Patient presents with  . Jaw Pain    Kimberly [name] is a 28 y.o. female with history of diabetes who presents with L sided neck pain/jaw pain. She states that yesterday she was at work and talking on the phone when she heard a "pop" and then started feeling pain over her left jaw and left anterior neck. The pain radiated to her shoulder but is primarily located in the neck. She feels the area is a little swollen.  She took 2 Tylenol yesterday and the pain went away. Today she woke up and the pain was back. She tried Tylenol again and it didn't get better. Her family member wanted her to come to the ED to make sure her jaw is not dislocated. She denies fever, jaw swelling, dental pain, headache, dizziness, difficulty speaking, or confusion. She denies posterior neck pain, paresthesias, or arm pain at this time.    HPI    Past Medical History:  Diagnosis Date  . Diabetes mellitus without complication ([place])   . Preeclampsia   . Preeclampsia without severe features   . Sickle cell trait [facility])     Patient Active Problem List   Diagnosis Date Noted  . Hypokalemia 09/06/2018  . Hypertensive urgency 09/06/2018  . Leukocytosis 09/06/2018  . GERD (gastroesophageal reflux disease) 09/06/2018  . Gastroparesis   . Intractable nausea and vomiting 08/29/2018  . Microcytic anemia 08/29/2018  . CIN III (cervical intraepithelial neoplasia grade III) with severe dysplasia 09/09/2017  . DKA, type 1 ([place]) 03/17/2015  . Diabetic nephropathy associated with type 1 diabetes mellitus ([place]) 03/17/2015  . Type 1 diabetes mellitus with diabetic nephropathy ([place]) 05/25/2013    Past Surgical History:  Procedure Laterality Date  . ESOPHAGOGASTRODUODENOSCOPY  08/2018   [facility] and a gastric enptying study   . WISDOM TOOTH EXTRACTION        OB History    Gravida  1   Para  1   Term      Preterm  1   AB      Living  1     SAB      TAB      Ectopic      Multiple      Live Births  1           Family History  Problem Relation Age of Onset  . Lupus Mother   . Aneurysm Mother   . Colon polyps Maternal Aunt        great aunt  . Colon cancer Neg Hx   . Esophageal cancer Neg Hx     Social History   Tobacco Use  . Smoking status: Never Smoker  . Smokeless tobacco: Never Used  Substance Use Topics  . Alcohol use: No  . Drug use: No    Home Medications Prior to Admission medications   Medication Sig Start Date End Date Taking? Authorizing Provider  BAYER CONTOUR NEXT TEST test strip USE AS DIRECTED TO TEST 6 TO 8 TIMES DAILY 10/01/14   Woodroe Mode, MD  Blood Glucose Monitoring Suppl (CONTOUR NEXT EZ MONITOR) W/DEVICE KIT 1 each by Does not apply route 6 (six) times daily. Provide lancets for testing six times daily. Type I DM with diabetes on Insulin Pump  DX 648.03 06/17/13   Woodroe Mode, MD  erythromycin (E-MYCIN) 250 MG tablet 1 tablet 3 (three) times daily. 10/03/18   [provider]  etonogestrel (NEXPLANON) 68 MG IMPL implant Inject 1 each into the skin once.    [provider]  gabapentin (NEURONTIN) 100 MG capsule 1 capsule 2 (two) times daily. 09/25/18   [provider]  Insulin Human (INSULIN PUMP) SOLN Inject into the skin. Humalog    [provider]  insulin lispro (HUMALOG) 100 UNIT/ML injection Inject into the skin 3 (three) times daily before meals. Via insulin pump    [provider]  lisinopril (PRINIVIL,ZESTRIL) 20 MG tablet Take 1 tablet (20 mg total) by mouth daily. 03/24/18   Patrecia Pour, Christean Grief, MD  methylPREDNISolone (MEDROL DOSEPAK) 4 MG TBPK tablet daily. 10/06/18   [provider]  metoprolol tartrate (LOPRESSOR) 50 MG tablet Take 1 tablet (50 mg total) by mouth 2 (two) times daily. 03/23/18   Doreatha Lew, MD     Allergies    Reglan [metoclopramide]  Review of Systems   Review of Systems  Constitutional: Negative for fever.  HENT: Negative for dental problem.   Musculoskeletal: Positive for myalgias and neck pain.  Neurological: Negative for dizziness, speech difficulty, weakness, numbness and headaches.    Physical Exam Updated Vital Signs BP 110/70 (BP Location: Right Arm)   Pulse 90   Temp (!) 97.4 F (36.3 C) (Oral)   Resp 20   LMP 08/05/2019   SpO2 99%   Physical Exam Vitals and nursing note reviewed.  Constitutional:      General: She is not in acute distress.    Appearance: Normal appearance. She is well-developed. She is not ill-appearing.  HENT:     Head: Normocephalic and atraumatic.     Right Ear: Tympanic membrane normal.     Left Ear: Tympanic membrane normal.     Nose: Nose normal.     Mouth/Throat:     Mouth: Mucous membranes are moist.     Comments: No TMJ tenderness. Able to fully open and close the jaw Eyes:     General: No scleral icterus.       Right eye: No discharge.        Left eye: No discharge.     Conjunctiva/sclera: Conjunctivae normal.     Pupils: Pupils are equal, round, and reactive to light.  Neck:     Vascular: Normal carotid pulses. No JVD.     Trachea: Trachea and phonation normal.  Cardiovascular:     Rate and Rhythm: Normal rate.  Pulmonary:     Effort: Pulmonary effort is normal. No respiratory distress.  Abdominal:     General: There is no distension.  Musculoskeletal:     Cervical back: Normal range of motion and neck supple. No edema. Pain with movement and muscular tenderness (tenderness along the left anterior neck) present. No spinous process tenderness.  Skin:    General: Skin is warm and dry.  Neurological:     Mental Status: She is alert and oriented to person, place, and time.     Comments: Lying on stretcher in NAD. GCS 15. Speaks in a clear voice. Cranial nerves II through XII grossly intact. Ambulatory    Psychiatric:        Behavior: Behavior normal.     ED Results / Procedures / Treatments   Labs (all labs ordered are listed, but only abnormal results are displayed) Labs Reviewed - No data to display  EKG EKG Interpretation  Date/Time:  Saturday August 10 2019 18:40:03 EST Ventricular Rate:  97 PR Interval:  150 QRS Duration: 66 QT Interval:  356 QTC Calculation: 452 R Axis:   5 Text Interpretation: Normal sinus rhythm Right atrial enlargement Nonspecific ST abnormality Abnormal ECG No acute changes Nonspecific ST and T wave abnormality Confirmed by Varney Biles [phone]) on 08/10/2019 7:04:05 PM   Radiology No results found.  Procedures Procedures (including critical care time)  Medications Ordered in ED Medications - No data to display  ED Course  I have reviewed the triage vital signs and the nursing notes.  Pertinent labs & imaging results that were available during my care of the patient were reviewed by me and considered in my medical decision making (see chart for details).  28 year old female presents with pain over the L neck and jaw after feeling a pop in the area yesterday. Vitals in triage are normal however she was hypertensive with discharge vitals which I was not notified of. She is neurologically intact. No complaints of headache or dizziness which makes an arterial dissection less likely. There is no obvious sign of a dental infection. No TMJ tenderness or difficulty speaking to suggest a jaw dislocation which was her primary concern. No posterior neck pain or radicular symptoms to suggest spinal involvement. She is tender over the anterior neck muscles. Suspect a mild strain. She is taking Tylenol without relief. She can't take NSAIDs due to GI issues. Will rx Tramadol. Return precautions discussed.  MDM Rules/Calculators/A&P  Final Clinical Impression(s) / ED Diagnoses Final diagnoses:  Neck pain on left side    Rx / DC Orders ED Discharge  Orders    None       Recardo Evangelist, PA-C 08/10/19 2140    Varney Biles, MD 08/11/19 1243

## 2019-08-10 NOTE — Discharge Instructions (Signed)
Continue Tylenol for mild-moderate pain Take Tramadol for severe pain Apply ice or heat to the affected area Please return if worsening

## 2019-08-10 NOTE — ED Triage Notes (Signed)
C/o L jaw pain since last night that radiates to L neck and L shoulder.Pain worse with movement.

## 2019-08-11 ENCOUNTER — Encounter (HOSPITAL_COMMUNITY): Payer: Self-pay

## 2019-11-28 ENCOUNTER — Encounter (HOSPITAL_COMMUNITY): Payer: Self-pay | Admitting: Emergency Medicine

## 2019-11-28 ENCOUNTER — Inpatient Hospital Stay (HOSPITAL_COMMUNITY)
Admission: EM | Admit: 2019-11-28 | Discharge: 2019-12-03 | DRG: 074 | Disposition: A | Discharge status: Home / Self Care | Payer: BC Managed Care – PPO | Attending: Internal Medicine | Admitting: Internal Medicine

## 2019-11-28 ENCOUNTER — Emergency Department (HOSPITAL_COMMUNITY): Payer: BC Managed Care – PPO

## 2019-11-28 ENCOUNTER — Other Ambulatory Visit: Payer: Self-pay

## 2019-11-28 DIAGNOSIS — E878 Other disorders of electrolyte and fluid balance, not elsewhere classified: Secondary | ICD-10-CM | POA: Diagnosis present

## 2019-11-28 DIAGNOSIS — D573 Sickle-cell trait: Secondary | ICD-10-CM | POA: Diagnosis present

## 2019-11-28 DIAGNOSIS — E1159 Type 2 diabetes mellitus with other circulatory complications: Secondary | ICD-10-CM | POA: Diagnosis present

## 2019-11-28 DIAGNOSIS — E860 Dehydration: Secondary | ICD-10-CM | POA: Diagnosis present

## 2019-11-28 DIAGNOSIS — D509 Iron deficiency anemia, unspecified: Secondary | ICD-10-CM | POA: Diagnosis present

## 2019-11-28 DIAGNOSIS — Z79899 Other long term (current) drug therapy: Secondary | ICD-10-CM

## 2019-11-28 DIAGNOSIS — I10 Essential (primary) hypertension: Secondary | ICD-10-CM | POA: Diagnosis present

## 2019-11-28 DIAGNOSIS — Z888 Allergy status to other drugs, medicaments and biological substances status: Secondary | ICD-10-CM

## 2019-11-28 DIAGNOSIS — Z20822 Contact with and (suspected) exposure to covid-19: Secondary | ICD-10-CM | POA: Diagnosis present

## 2019-11-28 DIAGNOSIS — E1043 Type 1 diabetes mellitus with diabetic autonomic (poly)neuropathy: Secondary | ICD-10-CM | POA: Diagnosis not present

## 2019-11-28 DIAGNOSIS — I129 Hypertensive chronic kidney disease with stage 1 through stage 4 chronic kidney disease, or unspecified chronic kidney disease: Secondary | ICD-10-CM | POA: Diagnosis present

## 2019-11-28 DIAGNOSIS — E876 Hypokalemia: Secondary | ICD-10-CM | POA: Diagnosis present

## 2019-11-28 DIAGNOSIS — Z87440 Personal history of urinary (tract) infections: Secondary | ICD-10-CM

## 2019-11-28 DIAGNOSIS — R112 Nausea with vomiting, unspecified: Secondary | ICD-10-CM | POA: Diagnosis present

## 2019-11-28 DIAGNOSIS — Z793 Long term (current) use of hormonal contraceptives: Secondary | ICD-10-CM

## 2019-11-28 DIAGNOSIS — K8010 Calculus of gallbladder with chronic cholecystitis without obstruction: Secondary | ICD-10-CM | POA: Diagnosis present

## 2019-11-28 DIAGNOSIS — Z794 Long term (current) use of insulin: Secondary | ICD-10-CM

## 2019-11-28 DIAGNOSIS — E1042 Type 1 diabetes mellitus with diabetic polyneuropathy: Secondary | ICD-10-CM | POA: Diagnosis present

## 2019-11-28 DIAGNOSIS — I152 Hypertension secondary to endocrine disorders: Secondary | ICD-10-CM | POA: Diagnosis present

## 2019-11-28 DIAGNOSIS — N12 Tubulo-interstitial nephritis, not specified as acute or chronic: Secondary | ICD-10-CM

## 2019-11-28 DIAGNOSIS — E1021 Type 1 diabetes mellitus with diabetic nephropathy: Secondary | ICD-10-CM | POA: Diagnosis present

## 2019-11-28 DIAGNOSIS — E1010 Type 1 diabetes mellitus with ketoacidosis without coma: Secondary | ICD-10-CM | POA: Diagnosis present

## 2019-11-28 DIAGNOSIS — R7989 Other specified abnormal findings of blood chemistry: Secondary | ICD-10-CM

## 2019-11-28 DIAGNOSIS — N390 Urinary tract infection, site not specified: Secondary | ICD-10-CM | POA: Diagnosis present

## 2019-11-28 DIAGNOSIS — E1022 Type 1 diabetes mellitus with diabetic chronic kidney disease: Secondary | ICD-10-CM | POA: Diagnosis present

## 2019-11-28 DIAGNOSIS — N179 Acute kidney failure, unspecified: Secondary | ICD-10-CM | POA: Diagnosis present

## 2019-11-28 DIAGNOSIS — E870 Hyperosmolality and hypernatremia: Secondary | ICD-10-CM | POA: Diagnosis present

## 2019-11-28 DIAGNOSIS — K3184 Gastroparesis: Secondary | ICD-10-CM | POA: Diagnosis present

## 2019-11-28 DIAGNOSIS — Z6827 Body mass index (BMI) 27.0-27.9, adult: Secondary | ICD-10-CM

## 2019-11-28 DIAGNOSIS — N182 Chronic kidney disease, stage 2 (mild): Secondary | ICD-10-CM | POA: Diagnosis present

## 2019-11-28 DIAGNOSIS — E663 Overweight: Secondary | ICD-10-CM | POA: Diagnosis present

## 2019-11-28 LAB — COMPREHENSIVE METABOLIC PANEL
ALT: 14 U/L (ref 0–44)
AST: 15 U/L (ref 15–41)
Albumin: 2.4 g/dL — ABNORMAL LOW (ref 3.5–5.0)
Alkaline Phosphatase: 146 U/L — ABNORMAL HIGH (ref 38–126)
Anion gap: 10 (ref 5–15)
BUN: 11 mg/dL (ref 6–20)
CO2: 20 mmol/L — ABNORMAL LOW (ref 22–32)
Calcium: 8.7 mg/dL — ABNORMAL LOW (ref 8.9–10.3)
Chloride: 112 mmol/L — ABNORMAL HIGH (ref 98–111)
Creatinine, Ser: 1.69 mg/dL — ABNORMAL HIGH (ref 0.44–1.00)
GFR calc Af Amer: 46 mL/min — ABNORMAL LOW (ref >60)
GFR calc non Af Amer: 40 mL/min — ABNORMAL LOW (ref >60)
Glucose, Bld: 163 mg/dL — ABNORMAL HIGH (ref 70–99)
Potassium: 4 mmol/L (ref 3.5–5.1)
Sodium: 142 mmol/L (ref 135–145)
Total Bilirubin: 0.7 mg/dL (ref 0.3–1.2)
Total Protein: 6.2 g/dL — ABNORMAL LOW (ref 6.5–8.1)

## 2019-11-28 LAB — URINALYSIS, ROUTINE W REFLEX MICROSCOPIC
Bilirubin Urine: NEGATIVE
Glucose, UA: 50 mg/dL — AB
Hgb urine dipstick: NEGATIVE
Ketones, ur: NEGATIVE mg/dL
Nitrite: NEGATIVE
Protein, ur: >=300 mg/dL — AB
Specific Gravity, Urine: 1.012 (ref 1.005–1.030)
WBC, UA: >50 WBC/hpf — ABNORMAL HIGH (ref 0–5)
pH: 5 (ref 5.0–8.0)

## 2019-11-28 LAB — CBC
HCT: 32.5 % — ABNORMAL LOW (ref 36.0–46.0)
Hemoglobin: 10.6 g/dL — ABNORMAL LOW (ref 12.0–15.0)
MCH: 25.7 pg — ABNORMAL LOW (ref 26.0–34.0)
MCHC: 32.6 g/dL (ref 30.0–36.0)
MCV: 78.9 fL — ABNORMAL LOW (ref 80.0–100.0)
Platelets: 358 10*3/uL (ref 150–400)
RBC: 4.12 MIL/uL (ref 3.87–5.11)
RDW: 14.5 % (ref 11.5–15.5)
WBC: 12.3 10*3/uL — ABNORMAL HIGH (ref 4.0–10.5)
nRBC: 0 % (ref 0.0–0.2)

## 2019-11-28 LAB — I-STAT BETA HCG BLOOD, ED (MC, WL, AP ONLY): I-stat hCG, quantitative: <5 m[IU]/mL (ref <5)

## 2019-11-28 LAB — LIPASE, BLOOD: Lipase: 20 U/L (ref 11–51)

## 2019-11-28 LAB — CBG MONITORING, ED: Glucose-Capillary: 215 mg/dL — ABNORMAL HIGH (ref 70–99)

## 2019-11-28 MED ORDER — ONDANSETRON HCL 4 MG/2ML IJ SOLN
4.0000 mg | Freq: Once | INTRAMUSCULAR | Status: AC
  Administered 2019-11-28: 4 mg via INTRAVENOUS
  Filled 2019-11-28: qty 2

## 2019-11-28 MED ORDER — SODIUM CHLORIDE 0.9% FLUSH
3.0000 mL | Freq: Once | INTRAVENOUS | Status: AC
  Administered 2019-12-01: 3 mL via INTRAVENOUS

## 2019-11-28 MED ORDER — PROMETHAZINE HCL 25 MG/ML IJ SOLN
25.0000 mg | Freq: Once | INTRAMUSCULAR | Status: AC
  Administered 2019-11-28: 25 mg via INTRAVENOUS
  Filled 2019-11-28: qty 1

## 2019-11-28 MED ORDER — SODIUM CHLORIDE 0.9 % IV BOLUS
1000.0000 mL | Freq: Once | INTRAVENOUS | Status: AC
  Administered 2019-11-28: 1000 mL via INTRAVENOUS

## 2019-11-28 MED ORDER — HYDROMORPHONE HCL 1 MG/ML IJ SOLN
0.5000 mg | Freq: Once | INTRAMUSCULAR | Status: AC
  Administered 2019-11-28: 20:00:00 0.5 mg via INTRAVENOUS
  Filled 2019-11-28: qty 1

## 2019-11-28 MED ORDER — IOHEXOL 300 MG/ML  SOLN
80.0000 mL | Freq: Once | INTRAMUSCULAR | Status: AC | PRN
  Administered 2019-11-28: 80 mL via INTRAVENOUS

## 2019-11-28 MED ORDER — LACTATED RINGERS IV BOLUS
1000.0000 mL | Freq: Once | INTRAVENOUS | Status: AC
  Administered 2019-11-29: 1000 mL via INTRAVENOUS

## 2019-11-28 MED ORDER — SODIUM CHLORIDE 0.9 % IV SOLN
1.0000 g | Freq: Once | INTRAVENOUS | Status: AC
  Administered 2019-11-28: 1 g via INTRAVENOUS
  Filled 2019-11-28: qty 10

## 2019-11-28 NOTE — ED Triage Notes (Signed)
Pt states she started having abd pain, n/v/d today. Denies any exposure to anyone that has been sick

## 2019-11-28 NOTE — ED Notes (Signed)
Pt threw up immediately after taking a few sips of water. Pt also reports her abdominal  pain is starting to return

## 2019-11-28 NOTE — ED Provider Notes (Signed)
[facility] EMERGENCY DEPARTMENT Provider Note   CSN: [id number] Arrival date & time: 11/28/19  1702     History Chief Complaint  Patient presents with   Abdominal Pain   Nausea   Emesis   Diarrhea    Kimberly [name] is a 29 y.o. female.  Patient with history of type 1 diabetes, diabetic nephropathy -- presents to the emergency department with complaint of nausea, vomiting, diarrhea, left-sided abdominal flank pain starting this morning.  Patient has had multiple episodes of nonbloody, nonbilious vomiting and watery diarrhea without blood.  Patient has had some left lower and left lateral abdominal pain as well as left back pain and flank pain.  She reports some burning after urinating and increased frequency and incomplete voiding.  She has a history of urinary tract infection.  She denies fevers or chills.  She denies any chest pain or shortness of breath.  No treatments prior to arrival.  No known sick contacts.  No history of abdominal surgeries.        Past Medical History:  Diagnosis Date   Diabetes mellitus without complication ([place])    Preeclampsia    Preeclampsia without severe features    Sickle cell trait ([place])     Patient Active Problem List   Diagnosis Date Noted   Hypokalemia 09/06/2018   Hypertensive urgency 09/06/2018   Leukocytosis 09/06/2018   GERD (gastroesophageal reflux disease) 09/06/2018   Gastroparesis    Intractable nausea and vomiting 08/29/2018   Microcytic anemia 08/29/2018   CIN III (cervical intraepithelial neoplasia grade III) with severe dysplasia 09/09/2017   DKA, type 1 ([place]) 03/17/2015   Diabetic nephropathy associated with type 1 diabetes mellitus ([place]) 03/17/2015   Type 1 diabetes mellitus with diabetic nephropathy ([place]) 05/25/2013    Past Surgical History:  Procedure Laterality Date   ESOPHAGOGASTRODUODENOSCOPY  08/2018   [place] and a gastric enptying study    WISDOM TOOTH  EXTRACTION       OB History    Gravida  1   Para  1   Term      Preterm  1   AB      Living  1     SAB      TAB      Ectopic      Multiple      Live Births  1           Family History  Problem Relation Age of Onset   Lupus Mother    Aneurysm Mother    Colon polyps Maternal Aunt        great aunt   Colon cancer Neg Hx    Esophageal cancer Neg Hx     Social History   Tobacco Use   Smoking status: Never Smoker   Smokeless tobacco: Never Used  Substance Use Topics   Alcohol use: No   Drug use: No    Home Medications Prior to Admission medications   Medication Sig Start Date End Date Taking? Authorizing Provider  BAYER CONTOUR NEXT TEST test strip USE AS DIRECTED TO TEST 6 TO 8 TIMES DAILY 10/01/14   Woodroe Mode, MD  Blood Glucose Monitoring Suppl (CONTOUR NEXT EZ MONITOR) W/DEVICE KIT 1 each by Does not apply route 6 (six) times daily. Provide lancets for testing six times daily. Type I DM with diabetes on Insulin Pump  DX 648.03 06/17/13   Woodroe Mode, MD  erythromycin (E-MYCIN) 250 MG tablet 1 tablet  3 (three) times daily. 10/03/18   [provider]  etonogestrel (NEXPLANON) 68 MG IMPL implant Inject 1 each into the skin once.    [provider]  gabapentin (NEURONTIN) 100 MG capsule 1 capsule 2 (two) times daily. 09/25/18   [provider]  Insulin Human (INSULIN PUMP) SOLN Inject into the skin. Humalog    [provider]  insulin lispro (HUMALOG) 100 UNIT/ML injection Inject into the skin 3 (three) times daily before meals. Via insulin pump    [provider]  lisinopril (PRINIVIL,ZESTRIL) 20 MG tablet Take 1 tablet (20 mg total) by mouth daily. 03/24/18   Patrecia Pour, Christean Grief, MD  methylPREDNISolone (MEDROL DOSEPAK) 4 MG TBPK tablet daily. 10/06/18   [provider]  metoprolol tartrate (LOPRESSOR) 50 MG tablet Take 1 tablet (50 mg total) by mouth 2 (two) times daily. 03/23/18   Doreatha Lew, MD  traMADol (ULTRAM) 50 MG tablet Take 1 tablet (50 mg total) by mouth every 6 (six) hours as needed. 08/10/19   Recardo Evangelist, PA-C    Allergies    Reglan [metoclopramide]  Review of Systems   Review of Systems  Constitutional: Negative for fever.  HENT: Negative for rhinorrhea and sore throat.   Eyes: Negative for redness.  Respiratory: Negative for cough.   Cardiovascular: Negative for chest pain.  Gastrointestinal: Positive for abdominal pain, diarrhea, nausea and vomiting.  Genitourinary: Positive for dysuria, flank pain and frequency. Negative for urgency.  Musculoskeletal: Negative for myalgias.  Skin: Negative for rash.  Neurological: Negative for headaches.    Physical Exam Updated Vital Signs BP (!) 173/103    Pulse (!) 101    Temp 98.7 F (37.1 C) (Oral)    Resp 18    Ht _0  (1.651 m)    Wt 75.3 kg    LMP 11/25/2019    SpO2 100%    BMI 27.62 kg/m   Physical Exam Vitals and nursing note reviewed.  Constitutional:      Appearance: She is well-developed.  HENT:     Head: Normocephalic and atraumatic.  Eyes:     General:        Right eye: No discharge.        Left eye: No discharge.     Conjunctiva/sclera: Conjunctivae normal.  Cardiovascular:     Rate and Rhythm: Normal rate and regular rhythm.     Heart sounds: Normal heart sounds.  Pulmonary:     Effort: Pulmonary effort is normal.     Breath sounds: Normal breath sounds.  Abdominal:     Palpations: Abdomen is soft.     Tenderness: There is abdominal tenderness in the suprapubic area, left upper quadrant and left lower quadrant. There is left CVA tenderness. There is no right CVA tenderness, guarding or rebound.  Musculoskeletal:     Cervical back: Normal range of motion and neck supple.  Skin:    General: Skin is warm and dry.  Neurological:     Mental Status: She is alert.     ED Results / Procedures / Treatments   Labs (all labs ordered are listed, but only abnormal results are  displayed) Labs Reviewed  COMPREHENSIVE METABOLIC PANEL - Abnormal; Notable for the following components:      Result Value   Chloride 112 (*)    CO2 20 (*)    Glucose, Bld 163 (*)    Creatinine, Ser 1.69 (*)    Calcium 8.7 (*)    Total Protein  6.2 (*)    Albumin 2.4 (*)    Alkaline Phosphatase 146 (*)    GFR calc non Af Amer 40 (*)    GFR calc Af Amer 46 (*)    All other components within normal limits  CBC - Abnormal; Notable for the following components:   WBC 12.3 (*)    Hemoglobin 10.6 (*)    HCT 32.5 (*)    MCV 78.9 (*)    MCH 25.7 (*)    All other components within normal limits  URINALYSIS, ROUTINE W REFLEX MICROSCOPIC - Abnormal; Notable for the following components:   APPearance CLOUDY (*)    Glucose, UA 50 (*)    Protein, ur >=300 (*)    Leukocytes,Ua SMALL (*)    WBC, UA >50 (*)    Bacteria, UA FEW (*)    All other components within normal limits  CBG MONITORING, ED - Abnormal; Notable for the following components:   Glucose-Capillary 215 (*)    All other components within normal limits  URINE CULTURE  LIPASE, BLOOD  I-STAT BETA HCG BLOOD, ED (MC, WL, AP ONLY)    EKG None  Radiology CT ABDOMEN PELVIS W CONTRAST  Result Date: 11/29/2019 CLINICAL DATA:  Left lower quadrant pain EXAM: CT ABDOMEN AND PELVIS WITH CONTRAST TECHNIQUE: Multidetector CT imaging of the abdomen and pelvis was performed using the standard protocol following bolus administration of intravenous contrast. CONTRAST:  95m OMNIPAQUE IOHEXOL 300 MG/ML  SOLN COMPARISON:  03/19/2018 FINDINGS: Lower chest: No acute abnormality. Hepatobiliary: Liver is within normal limits. Gallbladder is well distended with small dependent gallstones. No biliary ductal dilatation is seen. Pancreas: Unremarkable. No pancreatic ductal dilatation or surrounding inflammatory changes. Spleen: Normal in size without focal abnormality. Adrenals/Urinary Tract: Adrenal glands are within normal limits. Kidneys demonstrate a  normal enhancement pattern bilaterally. No renal calculi or obstructive changes are seen. The bladder is well distended. Stomach/Bowel: Stomach is within normal limits. Appendix appears normal. No evidence of bowel wall thickening, distention, or inflammatory changes. Vascular/Lymphatic: No significant vascular findings are present. No enlarged abdominal or pelvic lymph nodes. Reproductive: Uterus and bilateral adnexa are unremarkable. Other: No abdominal wall hernia or abnormality. No abdominopelvic ascites. Musculoskeletal: No acute bony abnormality is noted. Scarring is noted in the anterior subcutaneous tissues likely related to prior injection sites. This is stable from the prior exam. IMPRESSION: Cholelithiasis without complicating factors. No other focal abnormality is noted. Electronically Signed   By: MInez CatalinaM.D.   On: 11/29/2019 00:02    Procedures Procedures (including critical care time)  Medications Ordered in ED Medications  sodium chloride flush (NS) 0.9 % injection 3 mL (has no administration in time range)  sodium chloride 0.9 % bolus 1,000 mL (has no administration in time range)  ondansetron (ZOFRAN) injection 4 mg (has no administration in time range)    ED Course  I have reviewed the triage vital signs and the nursing notes.  Pertinent labs & imaging results that were available during my care of the patient were reviewed by me and considered in my medical decision making (see chart for details).  Patient seen and examined.  Work-up reviewed.  No evidence of DKA.  Patient with mild AKI.  Her urine appears infected.  Patient will be treated with IV fluids, antiemetics, Rocephin.  Will continue to monitor for improvement.  Vital signs reviewed and are as follows: BP (!) 173/103    Pulse (!) 101    Temp 98.7 F (37.1 C) (Oral)  Resp 18    Ht _0  (1.651 m)    Wt 75.3 kg    LMP 11/25/2019    SpO2 100%    BMI 27.62 kg/m   7:56 PM patient actively vomiting.  IV has  been established in right hand.  Awaiting administration of antiemetics and fluids.  11:04 PM patient failed p.o. challenge.  She continues to have abdominal pain.  Will obtain imaging.  If symptoms remain uncontrolled will need admission to the hospital.  Elevated BP noted as well. Pt was unable to take BP meds today due to symptoms.   12:08 AM CT reviewed.  No acute findings.  Signout to [employer] at shift change.  Patient will need rechecked.  If she continues to be significantly symptomatic and cannot tolerate fluids, she will need to be admitted to the hospital for suspected pyelonephritis.   MDM Rules/Calculators/A&P                      Patient with vomiting, left-sided abdominal pain/flank pain, UA suggestive of urinary tract infection.  Patient will need reassessed, admission for symptom control if indicated.  Patient also with significant hypertension.  She has been unable to take her medications today.  She does not have headache or focal neurologic symptoms to suggest intracranial hemorrhage.     Final Clinical Impression(s) / ED Diagnoses Final diagnoses:  Pyelonephritis  Intractable vomiting with nausea, unspecified vomiting type    Rx / DC Orders ED Discharge Orders    None       Carlisle Cater, PA-C 11/29/19 Ronald Pippins, MD 11/29/19 2240

## 2019-11-29 DIAGNOSIS — R112 Nausea with vomiting, unspecified: Secondary | ICD-10-CM

## 2019-11-29 DIAGNOSIS — N179 Acute kidney failure, unspecified: Secondary | ICD-10-CM | POA: Diagnosis not present

## 2019-11-29 DIAGNOSIS — E1021 Type 1 diabetes mellitus with diabetic nephropathy: Secondary | ICD-10-CM

## 2019-11-29 DIAGNOSIS — N184 Chronic kidney disease, stage 4 (severe): Secondary | ICD-10-CM | POA: Diagnosis present

## 2019-11-29 DIAGNOSIS — I10 Essential (primary) hypertension: Secondary | ICD-10-CM | POA: Diagnosis present

## 2019-11-29 DIAGNOSIS — E1159 Type 2 diabetes mellitus with other circulatory complications: Secondary | ICD-10-CM | POA: Diagnosis present

## 2019-11-29 DIAGNOSIS — N390 Urinary tract infection, site not specified: Secondary | ICD-10-CM | POA: Diagnosis not present

## 2019-11-29 DIAGNOSIS — K3184 Gastroparesis: Secondary | ICD-10-CM

## 2019-11-29 LAB — BETA-HYDROXYBUTYRIC ACID
Beta-Hydroxybutyric Acid: 0.99 mmol/L — ABNORMAL HIGH (ref 0.05–0.27)
Beta-Hydroxybutyric Acid: 1.13 mmol/L — ABNORMAL HIGH (ref 0.05–0.27)

## 2019-11-29 LAB — CBC
HCT: 32.2 % — ABNORMAL LOW (ref 36.0–46.0)
Hemoglobin: 10.5 g/dL — ABNORMAL LOW (ref 12.0–15.0)
MCH: 26 pg (ref 26.0–34.0)
MCHC: 32.6 g/dL (ref 30.0–36.0)
MCV: 79.7 fL — ABNORMAL LOW (ref 80.0–100.0)
Platelets: 324 10*3/uL (ref 150–400)
RBC: 4.04 MIL/uL (ref 3.87–5.11)
RDW: 14.5 % (ref 11.5–15.5)
WBC: 15.2 10*3/uL — ABNORMAL HIGH (ref 4.0–10.5)
nRBC: 0 % (ref 0.0–0.2)

## 2019-11-29 LAB — BASIC METABOLIC PANEL
Anion gap: 17 — ABNORMAL HIGH (ref 5–15)
Anion gap: 19 — ABNORMAL HIGH (ref 5–15)
Anion gap: 17 — ABNORMAL HIGH (ref 5–15)
Anion gap: 15 (ref 5–15)
Anion gap: 12 (ref 5–15)
BUN: 12 mg/dL (ref 6–20)
BUN: 16 mg/dL (ref 6–20)
BUN: 20 mg/dL (ref 6–20)
BUN: 20 mg/dL (ref 6–20)
BUN: 21 mg/dL — ABNORMAL HIGH (ref 6–20)
CO2: 15 mmol/L — ABNORMAL LOW (ref 22–32)
CO2: 15 mmol/L — ABNORMAL LOW (ref 22–32)
CO2: 16 mmol/L — ABNORMAL LOW (ref 22–32)
CO2: 18 mmol/L — ABNORMAL LOW (ref 22–32)
CO2: 19 mmol/L — ABNORMAL LOW (ref 22–32)
Calcium: 8.4 mg/dL — ABNORMAL LOW (ref 8.9–10.3)
Calcium: 8.8 mg/dL — ABNORMAL LOW (ref 8.9–10.3)
Calcium: 8.6 mg/dL — ABNORMAL LOW (ref 8.9–10.3)
Calcium: 8.6 mg/dL — ABNORMAL LOW (ref 8.9–10.3)
Calcium: 8.6 mg/dL — ABNORMAL LOW (ref 8.9–10.3)
Chloride: 105 mmol/L (ref 98–111)
Chloride: 105 mmol/L (ref 98–111)
Chloride: 106 mmol/L (ref 98–111)
Chloride: 110 mmol/L (ref 98–111)
Chloride: 112 mmol/L — ABNORMAL HIGH (ref 98–111)
Creatinine, Ser: 1.68 mg/dL — ABNORMAL HIGH (ref 0.44–1.00)
Creatinine, Ser: 1.8 mg/dL — ABNORMAL HIGH (ref 0.44–1.00)
Creatinine, Ser: 2.03 mg/dL — ABNORMAL HIGH (ref 0.44–1.00)
Creatinine, Ser: 2.18 mg/dL — ABNORMAL HIGH (ref 0.44–1.00)
Creatinine, Ser: 2.21 mg/dL — ABNORMAL HIGH (ref 0.44–1.00)
GFR calc Af Amer: 46 mL/min — ABNORMAL LOW (ref >60)
GFR calc Af Amer: 43 mL/min — ABNORMAL LOW (ref >60)
GFR calc Af Amer: 37 mL/min — ABNORMAL LOW (ref >60)
GFR calc Af Amer: 34 mL/min — ABNORMAL LOW (ref >60)
GFR calc Af Amer: 33 mL/min — ABNORMAL LOW (ref >60)
GFR calc non Af Amer: 40 mL/min — ABNORMAL LOW (ref >60)
GFR calc non Af Amer: 37 mL/min — ABNORMAL LOW (ref >60)
GFR calc non Af Amer: 32 mL/min — ABNORMAL LOW (ref >60)
GFR calc non Af Amer: 29 mL/min — ABNORMAL LOW (ref >60)
GFR calc non Af Amer: 29 mL/min — ABNORMAL LOW (ref >60)
Glucose, Bld: 344 mg/dL — ABNORMAL HIGH (ref 70–99)
Glucose, Bld: 327 mg/dL — ABNORMAL HIGH (ref 70–99)
Glucose, Bld: 358 mg/dL — ABNORMAL HIGH (ref 70–99)
Glucose, Bld: 143 mg/dL — ABNORMAL HIGH (ref 70–99)
Glucose, Bld: 146 mg/dL — ABNORMAL HIGH (ref 70–99)
Potassium: 4.9 mmol/L (ref 3.5–5.1)
Potassium: 4.2 mmol/L (ref 3.5–5.1)
Potassium: 4.6 mmol/L (ref 3.5–5.1)
Potassium: 4.1 mmol/L (ref 3.5–5.1)
Potassium: 3.9 mmol/L (ref 3.5–5.1)
Sodium: 137 mmol/L (ref 135–145)
Sodium: 139 mmol/L (ref 135–145)
Sodium: 139 mmol/L (ref 135–145)
Sodium: 143 mmol/L (ref 135–145)
Sodium: 143 mmol/L (ref 135–145)

## 2019-11-29 LAB — GLUCOSE, CAPILLARY
Glucose-Capillary: 172 mg/dL — ABNORMAL HIGH (ref 70–99)
Glucose-Capillary: 315 mg/dL — ABNORMAL HIGH (ref 70–99)
Glucose-Capillary: 336 mg/dL — ABNORMAL HIGH (ref 70–99)
Glucose-Capillary: 298 mg/dL — ABNORMAL HIGH (ref 70–99)
Glucose-Capillary: 245 mg/dL — ABNORMAL HIGH (ref 70–99)
Glucose-Capillary: 162 mg/dL — ABNORMAL HIGH (ref 70–99)
Glucose-Capillary: 117 mg/dL — ABNORMAL HIGH (ref 70–99)
Glucose-Capillary: 124 mg/dL — ABNORMAL HIGH (ref 70–99)
Glucose-Capillary: 133 mg/dL — ABNORMAL HIGH (ref 70–99)
Glucose-Capillary: 136 mg/dL — ABNORMAL HIGH (ref 70–99)
Glucose-Capillary: 127 mg/dL — ABNORMAL HIGH (ref 70–99)
Glucose-Capillary: 137 mg/dL — ABNORMAL HIGH (ref 70–99)

## 2019-11-29 LAB — HEMOGLOBIN A1C
Hgb A1c MFr Bld: 8.5 % — ABNORMAL HIGH (ref 4.8–5.6)
Mean Plasma Glucose: 197.25 mg/dL

## 2019-11-29 LAB — CBG MONITORING, ED
Glucose-Capillary: 285 mg/dL — ABNORMAL HIGH (ref 70–99)
Glucose-Capillary: 312 mg/dL — ABNORMAL HIGH (ref 70–99)
Glucose-Capillary: 361 mg/dL — ABNORMAL HIGH (ref 70–99)
Glucose-Capillary: 312 mg/dL — ABNORMAL HIGH (ref 70–99)
Glucose-Capillary: 281 mg/dL — ABNORMAL HIGH (ref 70–99)
Glucose-Capillary: 153 mg/dL — ABNORMAL HIGH (ref 70–99)

## 2019-11-29 LAB — SARS CORONAVIRUS 2 (TAT 6-24 HRS): SARS Coronavirus 2: NEGATIVE

## 2019-11-29 LAB — HIV ANTIBODY (ROUTINE TESTING W REFLEX): HIV Screen 4th Generation wRfx: NONREACTIVE

## 2019-11-29 MED ORDER — SODIUM CHLORIDE 0.9% FLUSH
10.0000 mL | INTRAVENOUS | Status: DC | PRN
  Administered 2019-11-29: 10 mL

## 2019-11-29 MED ORDER — LABETALOL HCL 5 MG/ML IV SOLN
10.0000 mg | INTRAVENOUS | Status: DC | PRN
  Administered 2019-11-29 – 2019-11-30 (×2): 20 mg via INTRAVENOUS
  Filled 2019-11-29 (×2): qty 4

## 2019-11-29 MED ORDER — DEXTROSE 50 % IV SOLN: 0.0000 mL | INTRAVENOUS | Status: DC | PRN

## 2019-11-29 MED ORDER — LABETALOL HCL 5 MG/ML IV SOLN: 10.0000 mg | INTRAVENOUS | Status: DC | PRN

## 2019-11-29 MED ORDER — HYDRALAZINE HCL 20 MG/ML IJ SOLN
20.0000 mg | INTRAMUSCULAR | Status: DC | PRN
  Administered 2019-11-29 (×2): 20 mg via INTRAVENOUS
  Filled 2019-11-29 (×3): qty 1

## 2019-11-29 MED ORDER — SODIUM CHLORIDE 0.9 % IV SOLN
250.0000 mg | Freq: Four times a day (QID) | INTRAVENOUS | Status: DC
  Administered 2019-11-29 – 2019-12-03 (×16): 250 mg via INTRAVENOUS
  Filled 2019-11-29 (×26): qty 5

## 2019-11-29 MED ORDER — HYDROMORPHONE HCL 1 MG/ML IJ SOLN
1.0000 mg | INTRAMUSCULAR | Status: DC | PRN
  Administered 2019-11-29 – 2019-12-02 (×9): 1 mg via INTRAVENOUS
  Filled 2019-11-29 (×9): qty 1

## 2019-11-29 MED ORDER — ENOXAPARIN SODIUM 40 MG/0.4ML ~~LOC~~ SOLN
40.0000 mg | SUBCUTANEOUS | Status: DC
  Administered 2019-11-29 – 2019-11-30 (×2): 40 mg via SUBCUTANEOUS
  Filled 2019-11-29 (×3): qty 0.4

## 2019-11-29 MED ORDER — SODIUM CHLORIDE 0.9% FLUSH
10.0000 mL | Freq: Two times a day (BID) | INTRAVENOUS | Status: DC
  Administered 2019-11-30 – 2019-12-02 (×6): 10 mL

## 2019-11-29 MED ORDER — ONDANSETRON HCL 4 MG/2ML IJ SOLN
4.0000 mg | Freq: Four times a day (QID) | INTRAMUSCULAR | Status: DC | PRN
  Administered 2019-11-29 – 2019-12-02 (×7): 4 mg via INTRAVENOUS
  Filled 2019-11-29 (×7): qty 2

## 2019-11-29 MED ORDER — SODIUM CHLORIDE 0.9 % IV SOLN: INTRAVENOUS | Status: DC

## 2019-11-29 MED ORDER — ACETAMINOPHEN 325 MG PO TABS
650.0000 mg | ORAL_TABLET | Freq: Four times a day (QID) | ORAL | Status: DC | PRN
  Administered 2019-11-29: 650 mg via ORAL
  Filled 2019-11-29: qty 2

## 2019-11-29 MED ORDER — LABETALOL HCL 5 MG/ML IV SOLN: 5.0000 mg | INTRAVENOUS | Status: DC | PRN

## 2019-11-29 MED ORDER — SODIUM CHLORIDE 0.9 % IV BOLUS
1000.0000 mL | Freq: Once | INTRAVENOUS | Status: AC
  Administered 2019-11-29: 1000 mL via INTRAVENOUS

## 2019-11-29 MED ORDER — PROMETHAZINE HCL 25 MG PO TABS
25.0000 mg | ORAL_TABLET | Freq: Four times a day (QID) | ORAL | Status: DC | PRN
  Administered 2019-11-30: 25 mg via ORAL
  Filled 2019-11-29: qty 1

## 2019-11-29 MED ORDER — ONDANSETRON HCL 4 MG PO TABS: 4.0000 mg | ORAL_TABLET | Freq: Four times a day (QID) | ORAL | Status: DC | PRN

## 2019-11-29 MED ORDER — SODIUM CHLORIDE 0.9 % IV SOLN
1.0000 g | INTRAVENOUS | Status: DC
  Administered 2019-11-30 – 2019-12-02 (×3): 1 g via INTRAVENOUS
  Filled 2019-11-29: qty 10
  Filled 2019-11-29 (×2): qty 1
  Filled 2019-11-29: qty 10

## 2019-11-29 MED ORDER — LISINOPRIL 20 MG PO TABS: 20.0000 mg | ORAL_TABLET | Freq: Every day | ORAL | Status: DC

## 2019-11-29 MED ORDER — METOPROLOL TARTRATE 50 MG PO TABS
50.0000 mg | ORAL_TABLET | Freq: Two times a day (BID) | ORAL | Status: DC
  Administered 2019-11-29 – 2019-12-03 (×9): 50 mg via ORAL
  Filled 2019-11-29: qty 1
  Filled 2019-11-29 (×2): qty 2
  Filled 2019-11-29 (×7): qty 1

## 2019-11-29 MED ORDER — PROMETHAZINE HCL 25 MG RE SUPP
25.0000 mg | Freq: Four times a day (QID) | RECTAL | Status: DC | PRN
  Filled 2019-11-29: qty 1

## 2019-11-29 MED ORDER — DEXTROSE-NACL 5-0.45 % IV SOLN: INTRAVENOUS | Status: DC

## 2019-11-29 MED ORDER — PROMETHAZINE HCL 25 MG/ML IJ SOLN
12.5000 mg | Freq: Four times a day (QID) | INTRAMUSCULAR | Status: DC | PRN
  Administered 2019-11-29 – 2019-12-02 (×3): 12.5 mg via INTRAVENOUS
  Filled 2019-11-29 (×3): qty 1

## 2019-11-29 MED ORDER — INSULIN PUMP
SUBCUTANEOUS | Status: DC
  Administered 2019-11-30: 0.7 via SUBCUTANEOUS
  Administered 2019-12-01: 0.8 via SUBCUTANEOUS
  Administered 2019-12-03: 2.3 via SUBCUTANEOUS
  Filled 2019-11-29: qty 1

## 2019-11-29 MED ORDER — INSULIN PUMP
SUBCUTANEOUS | Status: DC
  Filled 2019-11-29: qty 1

## 2019-11-29 MED ORDER — INSULIN REGULAR(HUMAN) IN NACL 100-0.9 UT/100ML-% IV SOLN
INTRAVENOUS | Status: DC
  Administered 2019-11-29: 09:00:00 8 [IU]/h via INTRAVENOUS
  Filled 2019-11-29: qty 100

## 2019-11-29 MED ORDER — ACETAMINOPHEN 650 MG RE SUPP: 650.0000 mg | Freq: Four times a day (QID) | RECTAL | Status: DC | PRN

## 2019-11-29 NOTE — ED Notes (Signed)
Pt's pump adjusted to 1.3 units based on last CBG

## 2019-11-29 NOTE — H&P (Addendum)
History and Physical    Kimberly [name] OXB:[id number] DOB: March 06, 1991 DOA: 11/28/2019  PCP: Jacelyn Pi, MD  Patient coming from: Home  I have personally briefly reviewed patient's old medical records in [place]  Chief Complaint: N/V  HPI: Kimberly [name] is a 29 y.o. female with medical history significant of DM1, gastroparesis.  Pt presents to ED with c/o abd pain, N/V, diarrhea, L sided flank pain.  Symptoms onset this morning.  Some associated dysuria.  No fevers nor chills.   ED Course: UA suggestive of UTI, started on rocephin.  Allergy to Reglan.  Creat 1.69 up from 1.1 baseline.  N/V persists despite dilaudid, zofran, phenergan.   Review of Systems: As per HPI, otherwise all review of systems negative.  Past Medical History:  Diagnosis Date  . Diabetes mellitus without complication ([place])   . Preeclampsia   . Preeclampsia without severe features   . Sickle cell trait [employer])     Past Surgical History:  Procedure Laterality Date  . ESOPHAGOGASTRODUODENOSCOPY  08/2018   [facility] [place] and a gastric enptying study   . WISDOM TOOTH EXTRACTION       reports that she has never smoked. She has never used smokeless tobacco. She reports that she does not drink alcohol or use drugs.  Allergies  Allergen Reactions  . Reglan [Metoclopramide] Other (See Comments)    tardive dyskinesia per outside provider note    Family History  Problem Relation Age of Onset  . Lupus Mother   . Aneurysm Mother   . Colon polyps Maternal Aunt        great aunt  . Colon cancer Neg Hx   . Esophageal cancer Neg Hx      Prior to Admission medications   Medication Sig Start Date End Date Taking? Authorizing Provider  BAYER CONTOUR NEXT TEST test strip USE AS DIRECTED TO TEST 6 TO 8 TIMES DAILY Patient taking differently: 1 each by Other route.  10/01/14  Yes Woodroe Mode, MD  Blood Glucose Monitoring Suppl (CONTOUR NEXT EZ MONITOR) W/DEVICE KIT 1 each by Does  not apply route 6 (six) times daily. Provide lancets for testing six times daily. Type I DM with diabetes on Insulin Pump  DX 648.03 06/17/13  Yes Woodroe Mode, MD  etonogestrel (NEXPLANON) 68 MG IMPL implant Inject 1 each into the skin once.   Yes [provider]  Insulin Human (INSULIN PUMP) SOLN Inject into the skin. Humalog   Yes [provider]  insulin lispro (HUMALOG) 100 UNIT/ML injection Inject 3-6 Units into the skin 3 (three) times daily before meals. Via insulin pump; INJECT 100 UNITS MAX DAILY DOSEAGE VIA INSULIN PUMP UNDER THE SKIN   Yes [provider]  lisinopril (PRINIVIL,ZESTRIL) 20 MG tablet Take 1 tablet (20 mg total) by mouth daily. 03/24/18  Yes Patrecia Pour, Christean Grief, MD  metoprolol tartrate (LOPRESSOR) 50 MG tablet Take 1 tablet (50 mg total) by mouth 2 (two) times daily. 03/23/18  Yes Doreatha Lew, MD    Physical Exam: Vitals:   11/29/19 0100 11/29/19 0115 11/29/19 0130 11/29/19 0146  BP: (!) 182/95 (!) 191/94 (!) 188/100   Pulse: 100 100 100   Resp: _0 Temp:    98.5 F (36.9 C)  TempSrc:    Oral  SpO2: 98% 98% 98%   Weight:      Height:        Constitutional: NAD, calm, comfortable Eyes: PERRL, lids  and conjunctivae normal ENMT: Mucous membranes are moist. Posterior pharynx clear of any exudate or lesions.Normal dentition.  Neck: normal, supple, no masses, no thyromegaly Respiratory: clear to auscultation bilaterally, no wheezing, no crackles. Normal respiratory effort. No accessory muscle use.  Cardiovascular: Regular rate and rhythm, no murmurs / rubs / gallops. No extremity edema. 2+ pedal pulses. No carotid bruits.  Abdomen: no tenderness, no masses palpated. No hepatosplenomegaly. Bowel sounds positive.  Musculoskeletal: no clubbing / cyanosis. No joint deformity upper and lower extremities. Good ROM, no contractures. Normal muscle tone.  Skin: no rashes, lesions, ulcers. No induration Neurologic: CN 2-12 grossly  intact. Sensation intact, DTR normal. Strength 5/5 in all 4.  Psychiatric: Normal judgment and insight. Alert and oriented x 3. Normal mood.    Labs on Admission: I have personally reviewed following labs and imaging studies  CBC: Recent Labs  Lab 11/28/19 1725  WBC 12.3*  HGB 10.6*  HCT 32.5*  MCV 78.9*  PLT 376   Basic Metabolic Panel: Recent Labs  Lab 11/28/19 1725  NA 142  K 4.0  CL 112*  CO2 20*  GLUCOSE 163*  BUN 11  CREATININE 1.69*  CALCIUM 8.7*   GFR: Estimated Creatinine Clearance: 49 mL/min (A) (by C-G formula based on SCr of 1.69 mg/dL (H)). Liver Function Tests: Recent Labs  Lab 11/28/19 1725  AST 15  ALT 14  ALKPHOS 146*  BILITOT 0.7  PROT 6.2*  ALBUMIN 2.4*   Recent Labs  Lab 11/28/19 1725  LIPASE 20   No results for input(s): AMMONIA in the last 168 hours. Coagulation Profile: No results for input(s): INR, PROTIME in the last 168 hours. Cardiac Enzymes: No results for input(s): CKTOTAL, CKMB, CKMBINDEX, TROPONINI in the last 168 hours. BNP (last 3 results) No results for input(s): PROBNP in the last 8760 hours. HbA1C: No results for input(s): HGBA1C in the last 72 hours. CBG: Recent Labs  Lab 11/28/19 1942  GLUCAP 215*   Lipid Profile: No results for input(s): CHOL, HDL, LDLCALC, TRIG, CHOLHDL, LDLDIRECT in the last 72 hours. Thyroid Function Tests: No results for input(s): TSH, T4TOTAL, FREET4, T3FREE, THYROIDAB in the last 72 hours. Anemia Panel: No results for input(s): VITAMINB12, FOLATE, FERRITIN, TIBC, IRON, RETICCTPCT in the last 72 hours. Urine analysis:    Component Value Date/Time   COLORURINE YELLOW 11/28/2019 1722   APPEARANCEUR CLOUDY (A) 11/28/2019 1722   LABSPEC 1.012 11/28/2019 1722   PHURINE 5.0 11/28/2019 1722   GLUCOSEU 50 (A) 11/28/2019 1722   HGBUR NEGATIVE 11/28/2019 1722   BILIRUBINUR NEGATIVE 11/28/2019 1722   KETONESUR NEGATIVE 11/28/2019 1722   PROTEINUR >=300 (A) 11/28/2019 1722   UROBILINOGEN  0.2 03/21/2015 2010   NITRITE NEGATIVE 11/28/2019 1722   LEUKOCYTESUR SMALL (A) 11/28/2019 1722    Radiological Exams on Admission: CT ABDOMEN PELVIS W CONTRAST  Result Date: 11/29/2019 CLINICAL DATA:  Left lower quadrant pain EXAM: CT ABDOMEN AND PELVIS WITH CONTRAST TECHNIQUE: Multidetector CT imaging of the abdomen and pelvis was performed using the standard protocol following bolus administration of intravenous contrast. CONTRAST:  64m OMNIPAQUE IOHEXOL 300 MG/ML  SOLN COMPARISON:  03/19/2018 FINDINGS: Lower chest: No acute abnormality. Hepatobiliary: Liver is within normal limits. Gallbladder is well distended with small dependent gallstones. No biliary ductal dilatation is seen. Pancreas: Unremarkable. No pancreatic ductal dilatation or surrounding inflammatory changes. Spleen: Normal in size without focal abnormality. Adrenals/Urinary Tract: Adrenal glands are within normal limits. Kidneys demonstrate a normal enhancement pattern bilaterally. No renal calculi or obstructive changes  are seen. The bladder is well distended. Stomach/Bowel: Stomach is within normal limits. Appendix appears normal. No evidence of bowel wall thickening, distention, or inflammatory changes. Vascular/Lymphatic: No significant vascular findings are present. No enlarged abdominal or pelvic lymph nodes. Reproductive: Uterus and bilateral adnexa are unremarkable. Other: No abdominal wall hernia or abnormality. No abdominopelvic ascites. Musculoskeletal: No acute bony abnormality is noted. Scarring is noted in the anterior subcutaneous tissues likely related to prior injection sites. This is stable from the prior exam. IMPRESSION: Cholelithiasis without complicating factors. No other focal abnormality is noted. Electronically Signed   By: Inez Catalina M.D.   On: 11/29/2019 00:02    EKG: Independently reviewed.  Assessment/Plan Principal Problem:   Intractable nausea and vomiting Active Problems:   Type 1 diabetes mellitus  with diabetic nephropathy (HCC)   Gastroparesis   Acute lower UTI   AKI (acute kidney injury) ([place])    1. Intractable N/V - 1. Diabetic gastroparesis vs UTI induced, will treat both: 2. PRN zofran 3. IVF: NS at 100 cc/hr 2. UTI - 1. Rocephin 2. UCx pending 3. No obstruction nor obvious pyelo findings on CT today 3. Diabetic gastroparesis 1. Scheduled IV erythromycin (pt with allergy to reglan listed). 2. NPO 4. AKI - 1. Likely pre-renal / ATN 2. IVF: NS at 125 cc/hr 3. Repeat BMP in AM 4. Strict intake and output 5. DM1 - 1. Cont insulin pump per pump protocol, Q4H CBG checks 2. No anion gap nor keytones in urine to suggest DKA at this time 3. Repeat BMP in AM 6. HTN - 1. Cont home BP meds 2. Add IV labetalol PRN 3. Hold lisinopril  DVT prophylaxis: Lovenox Code Status: Full Family Communication: No family in room Disposition Plan: Home after able to tolerate POs again Consults called: None Admission status: Place in 37     Earlisha Sharples, [facility]  How to contact the [facility] Attending or Consulting provider [place] or covering provider during after hours [place], for this patient?  1. Check the care team in [facility] and look for a) attending/consulting TR[facility] provider listed and b) the [facility] team listed 2. Log into [email or web address]  Amion Physician Scheduling and messaging for groups and whole hospitals  On call and physician scheduling software for group practices, residents, hospitalists and other medical providers for call, clinic, rotation and shift schedules. On[employer] is a hospital-wide system for scheduling doctors and paging doctors on call. EasyPlot is for scientific plotting and data analysis.  [email or web address]  and use [place]'s universal password to access. If you do not have the password, please contact the hospital operator.  3. Locate the [facility] provider you are looking for under [facility] and page to a number that you can be directly  reached. 4. If you still have difficulty reaching the provider, please page the [facility] (Director on Call) for the Hospitalists listed on amion for assistance.  11/29/2019, 1:50 AM

## 2019-11-29 NOTE — ED Notes (Signed)
Report given to [facility]

## 2019-11-29 NOTE — ED Notes (Signed)
Paged Dr Alcario Drought to RN Joe--Kimberly [name]

## 2019-11-29 NOTE — Progress Notes (Signed)
Inpatient Diabetes Program Recommendations  AACE/ADA: New Consensus Statement on Inpatient Glycemic Control (2015)  Target Ranges:  Prepandial:   less than 140 mg/dL      Peak postprandial:   less than 180 mg/dL (1-2 hours)      Critically ill patients:  140 - 180 mg/dL   Lab Results  Component Value Date   GLUCAP 153 (H) 11/29/2019   HGBA1C 8.5 (H) 11/29/2019    Review of Glycemic Control Results for SHAKISHA, ABEND (MRN [id number]) as of 11/29/2019 12:28  Ref. Range 11/29/2019 03:51 11/29/2019 05:14 11/29/2019 08:32 11/29/2019 09:55 11/29/2019 11:10  Glucose-Capillary Latest Ref Range: 70 - 99 mg/dL 312 (H) 361 (H) 312 (H) 281 (H) 153 (H)   Diabetes history: Type 1 DM since age 53 Outpatient Diabetes medications:  Medtronic 630 G-  Basal rates 0.625 units/ hr 1 unit/12 grams of CHO 1 unit drops blood sugars approximately 75 mg/dL- Goal =150 mg/dL Current orders for Inpatient glycemic control:  IV insulin/DKA order set  Inpatient Diabetes Program Recommendations:    Visited patient.  She is currently vomiting and asked "Am I in DKA?".  We discussed that she was slightly acidotic and now on insulin drip.  She was due to change her insulin pump site today.  Had patient remove insulin pump site and there was no apparent kink/ or obstruction.  Patient states she does have some scar tissue in abdomen.  Encouraged patient to stay away from scar tissue when placing new site.  BMP just drawn.   Patient does not have insulin pump supplies at bedside.  Will see if we have any that will work in our office.  If patient will resume insulin pump (once acidosis is resolved), will need to overlap insulin pump with insulin drip by 1-2 hours.  Patient states that most recent A1C was in the 8% range, which is much better for her (she has been as high as 13%).   Discussed with bedside RN.  Will follow  Thanks,  Adah Perl, RN, BC-ADM Inpatient Diabetes Coordinator Pager [phone] (8a-5p)

## 2019-11-29 NOTE — ED Notes (Addendum)
Notified MD related to Labetalol IV and Pt's current condition. Per order fluids were stopped and new orders put in.  It should be noted that the Erthromycian was completed before the order to stop.

## 2019-11-29 NOTE — Progress Notes (Signed)
BMP this AM back, now patient has anion gap of 17, bicarb of 15.  Looks like she went into mild DKA overnight.  1) DKA pathway 2) insulin gtt per pathway, stop insulin pump 3) BMP Q4H per pathway 4) dont want to give IVF bolus as she had this earlier in evening and has been struggling with high BPs all night 5) I let Dr. Loleta Books know about above.

## 2019-11-29 NOTE — ED Provider Notes (Signed)
1:08 AM Patient signed out to me with concerns for early pyelo.  UA is consistent with infection.  Has WBC clumps.  No CT evidence of pyelo.  Patient is still vomiting despite antiemetics.  Will likely need admission.  Case discussed with Dr. Alcario Drought, who is appreciated for admitting.   Montine Circle, PA-C 49/75/30 0511    Delora Fuel, MD 10/10/15 [phone]

## 2019-11-29 NOTE — Progress Notes (Addendum)
BP remains high despite 20mg  labetalol.  No CP, no SOB.  Will add 20mg  hydralazine.  And stop IVF.  Switch bed request to progressive.

## 2019-11-29 NOTE — Progress Notes (Signed)
PROGRESS NOTE    TELA KOTECKI  ERX:[id number] DOB: 08/02/1991 DOA: 11/28/2019 PCP: Jacelyn Pi, MD      Brief Narrative:  Mrs. Barba is a 29 y.o. F with T1DM, gastroparesis who presented with vomiting.      Assessment & Plan:  DKA -Continue insulin drip -q4 BMP -Cautious IV fluids -Plan to restart personal insulin pump after insulin infusion turned off this afternoon   UTI -Continue ceftriaxone -Follow urine and blood cultures -Dilaudid 1mg  q4hrs for now, but will re-eval in AM --> if pain is from UTI/flank/pyelo, it should be better by tomorrow and not require opiate level treatment; if pain/vomiting still present tomorrow, this suggests it is gastroparesis related and should NOT be treated with opiates, which will delay improvement   Gastroparesis -Continue erythromycin -PRN ondansetron or phenergan  AKI -Cautious IV fluids -Hold lisinopril -Treat DKA  Hypertension -Continue metoprolol -Hold lisinopril       Disposition: The patient was admitted with DKA.   I will discharge when her gap has closed, her vomiting is resolve dand she is able to take PO adequately.        MDM: This is a no charge note.  For further details, please see H&P by my partner Dr. Alcario Drought from earlier today.  The below labs and imaging reports were reviewed and summarized above.    DVT prophylaxis: Lovenox Code Status: FULL Family Communication:     Consultants:     Procedures:   4/2 CT abdomen and pelvis -- unremarkable, no hydronrpheosis, no bowel disease  Antimicrobials:   Ceftriaxone 4/2 >>   Culture data:   4/2 urine culture  4/2 blod culture x2           Subjective: Still actively vomiting and with left flank and LUQ abdominal pain.          Objective: Vitals:   11/29/19 1045 11/29/19 1100 11/29/19 1115 11/29/19 1200  BP: (!) 166/97 (!) 164/88 (!) 165/90 (!) 185/109  Pulse:    92  Resp: 18 18 14 14   Temp:    98.7 F (37.1  C)  TempSrc:    Oral  SpO2:    100%  Weight:      Height:        Intake/Output Summary (Last 24 hours) at 11/29/2019 1411 Last data filed at 11/29/2019 1120 Gross per 24 hour  Intake 182.7 ml  Output --  Net 182.7 ml   Filed Weights   11/28/19 1707  Weight: 75.3 kg    Examination: The patient was seen and examined.      Data Reviewed: I have personally reviewed following labs and imaging studies:  CBC: Recent Labs  Lab 11/28/19 1725 11/29/19 0354  WBC 12.3* 15.2*  HGB 10.6* 10.5*  HCT 32.5* 32.2*  MCV 78.9* 79.7*  PLT 358 950   Basic Metabolic Panel: Recent Labs  Lab 11/28/19 1725 11/29/19 0354 11/29/19 0735 11/29/19 1330  NA 142 137 139 139  K 4.0 4.9 4.2 4.6  CL 112* 105 105 106  CO2 20* 15* 15* 16*  GLUCOSE 163* 344* 327* 358*  BUN 11 12 16 20   CREATININE 1.69* 1.68* 1.80* 2.03*  CALCIUM 8.7* 8.4* 8.8* 8.6*   GFR: Estimated Creatinine Clearance: 40.8 mL/min (A) (by C-G formula based on SCr of 2.03 mg/dL (H)). Liver Function Tests: Recent Labs  Lab 11/28/19 1725  AST 15  ALT 14  ALKPHOS 146*  BILITOT 0.7  PROT 6.2*  ALBUMIN 2.4*   Recent  Labs  Lab 11/28/19 1725  LIPASE 20   No results for input(s): AMMONIA in the last 168 hours. Coagulation Profile: No results for input(s): INR, PROTIME in the last 168 hours. Cardiac Enzymes: No results for input(s): CKTOTAL, CKMB, CKMBINDEX, TROPONINI in the last 168 hours. BNP (last 3 results) No results for input(s): PROBNP in the last 8760 hours. HbA1C: Recent Labs    11/29/19 0735  HGBA1C 8.5*   CBG: Recent Labs  Lab 11/29/19 0955 11/29/19 1110 11/29/19 1231 11/29/19 1338 11/29/19 1341  GLUCAP 281* 153* 172* 315* 336*   Lipid Profile: No results for input(s): CHOL, HDL, LDLCALC, TRIG, CHOLHDL, LDLDIRECT in the last 72 hours. Thyroid Function Tests: No results for input(s): TSH, T4TOTAL, FREET4, T3FREE, THYROIDAB in the last 72 hours. Anemia Panel: No results for input(s):  VITAMINB12, FOLATE, FERRITIN, TIBC, IRON, RETICCTPCT in the last 72 hours. Urine analysis:    Component Value Date/Time   COLORURINE YELLOW 11/28/2019 1722   APPEARANCEUR CLOUDY (A) 11/28/2019 1722   LABSPEC 1.012 11/28/2019 1722   PHURINE 5.0 11/28/2019 1722   GLUCOSEU 50 (A) 11/28/2019 1722   HGBUR NEGATIVE 11/28/2019 1722   BILIRUBINUR NEGATIVE 11/28/2019 1722   KETONESUR NEGATIVE 11/28/2019 1722   PROTEINUR >=300 (A) 11/28/2019 1722   UROBILINOGEN 0.2 03/21/2015 2010   NITRITE NEGATIVE 11/28/2019 1722   LEUKOCYTESUR SMALL (A) 11/28/2019 1722   Sepsis Labs: @LABRCNTIP (procalcitonin:4,lacticacidven:4)  ) Recent Results (from the past 240 hour(s))  SARS CORONAVIRUS 2 (TAT 6-24 HRS) Nasopharyngeal Nasopharyngeal Swab     Status: None   Collection Time: 11/29/19  1:07 AM   Specimen: Nasopharyngeal Swab  Result Value Ref Range Status   SARS Coronavirus 2 NEGATIVE NEGATIVE Final    Comment: (NOTE) SARS-CoV-2 target nucleic acids are NOT DETECTED. The SARS-CoV-2 RNA is generally detectable in upper and lower respiratory specimens during the acute phase of infection. Negative results do not preclude SARS-CoV-2 infection, do not rule out co-infections with other pathogens, and should not be used as the sole basis for treatment or other patient management decisions. Negative results must be combined with clinical observations, patient history, and epidemiological information. The expected result is Negative. Fact Sheet for Patients: SugarRoll.be Fact Sheet for Healthcare Providers: https://www.woods-mathews.com/ This test is not yet approved or cleared by the [place] FDA and  has been authorized for detection and/or diagnosis of SARS-CoV-2 by FDA under an Emergency Use Authorization (EUA). This EUA will remain  in effect (meaning this test can be used) for the duration of the COVID-19 declaration under Section 56 4(b)(1) of the Act,  21 U.S.C. section 360bbb-3(b)(1), unless the authorization is terminated or revoked sooner. Performed at [facility], [place] [address], [place], [place] [address]          Radiology Studies: CT ABDOMEN PELVIS W CONTRAST  Result Date: 11/29/2019 CLINICAL DATA:  Left lower quadrant pain EXAM: CT ABDOMEN AND PELVIS WITH CONTRAST TECHNIQUE: Multidetector CT imaging of the abdomen and pelvis was performed using the standard protocol following bolus administration of intravenous contrast. CONTRAST:  91mL OMNIPAQUE IOHEXOL 300 MG/ML  SOLN COMPARISON:  03/19/2018 FINDINGS: Lower chest: No acute abnormality. Hepatobiliary: Liver is within normal limits. Gallbladder is well distended with small dependent gallstones. No biliary ductal dilatation is seen. Pancreas: Unremarkable. No pancreatic ductal dilatation or surrounding inflammatory changes. Spleen: Normal in size without focal abnormality. Adrenals/Urinary Tract: Adrenal glands are within normal limits. Kidneys demonstrate a normal enhancement pattern bilaterally. No renal calculi or obstructive changes are seen.  The bladder is well distended. Stomach/Bowel: Stomach is within normal limits. Appendix appears normal. No evidence of bowel wall thickening, distention, or inflammatory changes. Vascular/Lymphatic: No significant vascular findings are present. No enlarged abdominal or pelvic lymph nodes. Reproductive: Uterus and bilateral adnexa are unremarkable. Other: No abdominal wall hernia or abnormality. No abdominopelvic ascites. Musculoskeletal: No acute bony abnormality is noted. Scarring is noted in the anterior subcutaneous tissues likely related to prior injection sites. This is stable from the prior exam. IMPRESSION: Cholelithiasis without complicating factors. No other focal abnormality is noted. Electronically Signed   By: Inez Catalina M.D.   On: 11/29/2019 00:02        Scheduled Meds: . enoxaparin (LOVENOX) injection  40 mg Subcutaneous  Q24H  . metoprolol tartrate  50 mg Oral BID  . sodium chloride flush  3 mL Intravenous Once   Continuous Infusions: . sodium chloride Stopped (11/29/19 1120)  . [START ON 11/30/2019] cefTRIAXone (ROCEPHIN)  IV    . dextrose 5 % and 0.45% NaCl 75 mL/hr at 11/29/19 1118  . erythromycin 250 mg (11/29/19 1321)  . insulin 7.5 Units/hr (11/29/19 1341)     LOS: 0 days    Time spent: 25 minutes    Edwin Dada, MD [facility] 11/29/2019, 2:11 PM     Please page though [place] or Epic secure chat:  For password, contact charge nurse

## 2019-11-29 NOTE — ED Notes (Signed)
Paged Dr Alcario Drought to RN [facility]

## 2019-11-29 NOTE — Progress Notes (Signed)
Reported pts anion gap to provider on call. Order's recd. Pt's insulin gtt d/ced. Witnessed pt place her insulin pump on herself without difficulty.

## 2019-11-29 NOTE — ED Notes (Signed)
Pt. Adjusted her pump to 2,2 units of insulin

## 2019-11-30 DIAGNOSIS — Z79899 Other long term (current) drug therapy: Secondary | ICD-10-CM | POA: Diagnosis not present

## 2019-11-30 DIAGNOSIS — D509 Iron deficiency anemia, unspecified: Secondary | ICD-10-CM | POA: Diagnosis present

## 2019-11-30 DIAGNOSIS — E1010 Type 1 diabetes mellitus with ketoacidosis without coma: Secondary | ICD-10-CM

## 2019-11-30 DIAGNOSIS — N179 Acute kidney failure, unspecified: Secondary | ICD-10-CM | POA: Diagnosis present

## 2019-11-30 DIAGNOSIS — Z87440 Personal history of urinary (tract) infections: Secondary | ICD-10-CM | POA: Diagnosis not present

## 2019-11-30 DIAGNOSIS — N390 Urinary tract infection, site not specified: Secondary | ICD-10-CM | POA: Diagnosis present

## 2019-11-30 DIAGNOSIS — Z6827 Body mass index (BMI) 27.0-27.9, adult: Secondary | ICD-10-CM | POA: Diagnosis not present

## 2019-11-30 DIAGNOSIS — Z888 Allergy status to other drugs, medicaments and biological substances status: Secondary | ICD-10-CM | POA: Diagnosis not present

## 2019-11-30 DIAGNOSIS — Z793 Long term (current) use of hormonal contraceptives: Secondary | ICD-10-CM | POA: Diagnosis not present

## 2019-11-30 DIAGNOSIS — K8010 Calculus of gallbladder with chronic cholecystitis without obstruction: Secondary | ICD-10-CM | POA: Diagnosis present

## 2019-11-30 DIAGNOSIS — I129 Hypertensive chronic kidney disease with stage 1 through stage 4 chronic kidney disease, or unspecified chronic kidney disease: Secondary | ICD-10-CM | POA: Diagnosis present

## 2019-11-30 DIAGNOSIS — E663 Overweight: Secondary | ICD-10-CM | POA: Diagnosis present

## 2019-11-30 DIAGNOSIS — E878 Other disorders of electrolyte and fluid balance, not elsewhere classified: Secondary | ICD-10-CM | POA: Diagnosis present

## 2019-11-30 DIAGNOSIS — K3184 Gastroparesis: Secondary | ICD-10-CM | POA: Diagnosis present

## 2019-11-30 DIAGNOSIS — E876 Hypokalemia: Secondary | ICD-10-CM | POA: Diagnosis present

## 2019-11-30 DIAGNOSIS — E870 Hyperosmolality and hypernatremia: Secondary | ICD-10-CM | POA: Diagnosis present

## 2019-11-30 DIAGNOSIS — E1042 Type 1 diabetes mellitus with diabetic polyneuropathy: Secondary | ICD-10-CM | POA: Diagnosis present

## 2019-11-30 DIAGNOSIS — E1043 Type 1 diabetes mellitus with diabetic autonomic (poly)neuropathy: Secondary | ICD-10-CM | POA: Diagnosis present

## 2019-11-30 DIAGNOSIS — Z20822 Contact with and (suspected) exposure to covid-19: Secondary | ICD-10-CM | POA: Diagnosis present

## 2019-11-30 DIAGNOSIS — D573 Sickle-cell trait: Secondary | ICD-10-CM | POA: Diagnosis present

## 2019-11-30 DIAGNOSIS — N182 Chronic kidney disease, stage 2 (mild): Secondary | ICD-10-CM | POA: Diagnosis present

## 2019-11-30 DIAGNOSIS — E860 Dehydration: Secondary | ICD-10-CM | POA: Diagnosis present

## 2019-11-30 DIAGNOSIS — Z794 Long term (current) use of insulin: Secondary | ICD-10-CM | POA: Diagnosis not present

## 2019-11-30 DIAGNOSIS — E1022 Type 1 diabetes mellitus with diabetic chronic kidney disease: Secondary | ICD-10-CM | POA: Diagnosis present

## 2019-11-30 DIAGNOSIS — R112 Nausea with vomiting, unspecified: Secondary | ICD-10-CM | POA: Diagnosis not present

## 2019-11-30 LAB — GLUCOSE, CAPILLARY
Glucose-Capillary: 171 mg/dL — ABNORMAL HIGH (ref 70–99)
Glucose-Capillary: 175 mg/dL — ABNORMAL HIGH (ref 70–99)
Glucose-Capillary: 185 mg/dL — ABNORMAL HIGH (ref 70–99)
Glucose-Capillary: 205 mg/dL — ABNORMAL HIGH (ref 70–99)
Glucose-Capillary: 217 mg/dL — ABNORMAL HIGH (ref 70–99)
Glucose-Capillary: 275 mg/dL — ABNORMAL HIGH (ref 70–99)

## 2019-11-30 LAB — URINALYSIS, ROUTINE W REFLEX MICROSCOPIC
Bilirubin Urine: NEGATIVE
Glucose, UA: 150 mg/dL — AB
Hgb urine dipstick: NEGATIVE
Ketones, ur: NEGATIVE mg/dL
Leukocytes,Ua: NEGATIVE
Nitrite: NEGATIVE
Protein, ur: >=300 mg/dL — AB
Specific Gravity, Urine: 1.025 (ref 1.005–1.030)
WBC, UA: >50 WBC/hpf — ABNORMAL HIGH (ref 0–5)
pH: 5 (ref 5.0–8.0)

## 2019-11-30 LAB — SODIUM, URINE, RANDOM: Sodium, Ur: 22 mmol/L

## 2019-11-30 LAB — BASIC METABOLIC PANEL
Anion gap: 13 (ref 5–15)
BUN: 21 mg/dL — ABNORMAL HIGH (ref 6–20)
CO2: 18 mmol/L — ABNORMAL LOW (ref 22–32)
Calcium: 8.5 mg/dL — ABNORMAL LOW (ref 8.9–10.3)
Chloride: 110 mmol/L (ref 98–111)
Creatinine, Ser: 2.65 mg/dL — ABNORMAL HIGH (ref 0.44–1.00)
GFR calc Af Amer: 27 mL/min — ABNORMAL LOW (ref >60)
GFR calc non Af Amer: 23 mL/min — ABNORMAL LOW (ref >60)
Glucose, Bld: 261 mg/dL — ABNORMAL HIGH (ref 70–99)
Potassium: 3.7 mmol/L (ref 3.5–5.1)
Sodium: 141 mmol/L (ref 135–145)

## 2019-11-30 LAB — TROPONIN I (HIGH SENSITIVITY): Troponin I (High Sensitivity): 19 ng/L — ABNORMAL HIGH (ref <18)

## 2019-11-30 LAB — CREATININE, URINE, RANDOM: Creatinine, Urine: 119.2 mg/dL

## 2019-11-30 MED ORDER — LORAZEPAM 2 MG/ML IJ SOLN
1.0000 mg | Freq: Once | INTRAMUSCULAR | Status: AC
  Administered 2019-11-30: 1 mg via INTRAVENOUS
  Filled 2019-11-30: qty 1

## 2019-11-30 MED ORDER — HYDRALAZINE HCL 20 MG/ML IJ SOLN
20.0000 mg | Freq: Four times a day (QID) | INTRAMUSCULAR | Status: DC
  Administered 2019-11-30 – 2019-12-03 (×11): 20 mg via INTRAVENOUS
  Filled 2019-11-30 (×11): qty 1

## 2019-11-30 NOTE — Progress Notes (Signed)
MD informed that pt had some nausea today, not eating, does not want any d5 iv solutions and b/p was elevated which was treated with ordered meds.

## 2019-11-30 NOTE — Progress Notes (Signed)
   Follow Up Note  HPI: 29 year old female with past medical history of type 1 diabetes mellitus with complications of peripheral neuropathy, nephropathy and gastroparesis admitted on the early morning of 4/2 with complaints of abdominal pain, dysuria and found to have UTI plus also in mild DKA.  Started on IV Rocephin, IV fluids and insulin drip.  Several hours later, gap corrected and fluids adjusted restarted her insulin pump and insulin drip discontinued. Pt admitted earlier this morning.  Seen after arrived to floor.    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  Continue clear liquids for now, symptomatically treat   DKA, type 1 ([place]): DKA resolved.  Follow basic metabolic panel, continue gentle IV fluids.  Patient adjusting insulin as per pump.    Gastroparesis: Reglan    Acute lower UTI: Urine culture pending, currently on IV Rocephin    AKI (acute kidney injury) ([place]): Secondary to DKA and with fluids, improved although increased back up later this morning.  Have increased IV fluids    HTN (hypertension): Blood pressure remains elevated, may be in part due to discomfort, poor tolerance of p.o. medications.  Have changed hydralazine to scheduled.  On as needed labetalol, continuing home metoprolol.  Holding lisinopril due to renal dysfunction.    Overweight (BMI 25.0-29.9): Meets criteria for BMI greater than 25   Disposition: Potential discharge tomorrow if she is tolerating advancement of diet

## 2019-11-30 NOTE — Progress Notes (Signed)
Pt calls out stating "I feel terrible again. Can you call the Dr?" This nurse tried to understand what pt was feeling and how to help. Pt now reporting anxiety and chest pain. States "I just need something for anxiety" Provider on call paged by charge nurse and EKG done and transmitted. Orders rec'd.

## 2019-12-01 ENCOUNTER — Inpatient Hospital Stay (HOSPITAL_COMMUNITY): Payer: BC Managed Care – PPO

## 2019-12-01 LAB — CBC
HCT: 28.5 % — ABNORMAL LOW (ref 36.0–46.0)
Hemoglobin: 9.6 g/dL — ABNORMAL LOW (ref 12.0–15.0)
MCH: 25.7 pg — ABNORMAL LOW (ref 26.0–34.0)
MCHC: 33.7 g/dL (ref 30.0–36.0)
MCV: 76.2 fL — ABNORMAL LOW (ref 80.0–100.0)
Platelets: 355 10*3/uL (ref 150–400)
RBC: 3.74 MIL/uL — ABNORMAL LOW (ref 3.87–5.11)
RDW: 13.9 % (ref 11.5–15.5)
WBC: 12.8 10*3/uL — ABNORMAL HIGH (ref 4.0–10.5)
nRBC: 0 % (ref 0.0–0.2)

## 2019-12-01 LAB — COMPREHENSIVE METABOLIC PANEL
ALT: 12 U/L (ref 0–44)
AST: 11 U/L — ABNORMAL LOW (ref 15–41)
Albumin: 2.1 g/dL — ABNORMAL LOW (ref 3.5–5.0)
Alkaline Phosphatase: 131 U/L — ABNORMAL HIGH (ref 38–126)
Anion gap: 11 (ref 5–15)
BUN: 20 mg/dL (ref 6–20)
CO2: 19 mmol/L — ABNORMAL LOW (ref 22–32)
Calcium: 8.1 mg/dL — ABNORMAL LOW (ref 8.9–10.3)
Chloride: 116 mmol/L — ABNORMAL HIGH (ref 98–111)
Creatinine, Ser: 2.92 mg/dL — ABNORMAL HIGH (ref 0.44–1.00)
GFR calc Af Amer: 24 mL/min — ABNORMAL LOW (ref >60)
GFR calc non Af Amer: 21 mL/min — ABNORMAL LOW (ref >60)
Glucose, Bld: 189 mg/dL — ABNORMAL HIGH (ref 70–99)
Potassium: 3.5 mmol/L (ref 3.5–5.1)
Sodium: 146 mmol/L — ABNORMAL HIGH (ref 135–145)
Total Bilirubin: 0.2 mg/dL — ABNORMAL LOW (ref 0.3–1.2)
Total Protein: 5.4 g/dL — ABNORMAL LOW (ref 6.5–8.1)

## 2019-12-01 LAB — URINE CULTURE: Culture: <10000 — AB

## 2019-12-01 LAB — GLUCOSE, CAPILLARY
Glucose-Capillary: 138 mg/dL — ABNORMAL HIGH (ref 70–99)
Glucose-Capillary: 138 mg/dL — ABNORMAL HIGH (ref 70–99)
Glucose-Capillary: 139 mg/dL — ABNORMAL HIGH (ref 70–99)
Glucose-Capillary: 153 mg/dL — ABNORMAL HIGH (ref 70–99)
Glucose-Capillary: 168 mg/dL — ABNORMAL HIGH (ref 70–99)
Glucose-Capillary: 169 mg/dL — ABNORMAL HIGH (ref 70–99)
Glucose-Capillary: 180 mg/dL — ABNORMAL HIGH (ref 70–99)

## 2019-12-01 MED ORDER — ENOXAPARIN SODIUM 30 MG/0.3ML ~~LOC~~ SOLN
30.0000 mg | SUBCUTANEOUS | Status: DC
  Administered 2019-12-01 – 2019-12-02 (×2): 30 mg via SUBCUTANEOUS
  Filled 2019-12-01 (×2): qty 0.3

## 2019-12-01 MED ORDER — POTASSIUM CHLORIDE 2 MEQ/ML IV SOLN
INTRAVENOUS | Status: DC
  Filled 2019-12-01 (×4): qty 1000

## 2019-12-01 NOTE — Progress Notes (Addendum)
PROGRESS NOTE  Kimberly [name] [id number] DOB: Jul 22, 1991 DOA: 11/28/2019 PCP: Jacelyn Pi, MD   Brief summary:  Chief Complaint: N/V  HPI: Kimberly [name] is a 29 y.o. female with medical history significant of DM1, gastroparesis.  Pt presents to ED with c/o abd pain, N/V, diarrhea, L sided flank pain.  Symptoms onset this morning.  Some associated dysuria.  No fevers nor chills.   ED Course: UA suggestive of UTI, started on rocephin.  Allergy to Reglan.  Creat 1.69 up from 1.1 baseline.  N/V persists despite dilaudid, zofran, phenergan.  HPI/Recap of past 24 hours:  Continue have intermittent nausea and vomiting, not able to tolerate oral intake Report lower abdominal pain Creatinine worsening Father at bedside  Assessment/Plan: Principal Problem:   Intractable nausea and vomiting Active Problems:   Type 1 diabetes mellitus with diabetic nephropathy (HCC)   DKA, type 1 (HCC)   Gastroparesis   Acute lower UTI   AKI (acute kidney injury) ([place])   HTN (hypertension)   Overweight (BMI 25.0-29.9)  DKA with history of insulin-dependent type 2 diabetes, uncontrolled, A1c 8.5 -Initially treated with insulin drip IV hydration, gap closed, is now transition back to personal insulin pump  Intractable nausea and vomiting /gastroparesis -CT abdomen on presentation no acute findings  allergic to Reglan, currently on erythromycin, IV hydration Clear liquid diet as tolerated  UTI/AKI/possible left pyelonephritis Urine culture in process CT abdomen urinary tract unremarkable Persistent leukocytosis, worsening creatinine, will get renal ultrasound Continue antibiotics, continue IV hydration, renal dosing meds, home med lisinopril held since admission   Hypochloremic hypernatremia -clinically still dehydrated, change ivf from normal saline to LR.  Hypokalemia, replace K, check mag   Microcytic anemia -Hemoglobin 9.6  Hypertension Home meds  lisinopril held since admission Currently BP stable on hydralazine and Lopressor  Cholelithiasis, no biliary ductal dilatation, no sign of cholecystitis. Incidental finding on CT abdomen  Body mass index is 27.62 kg/m.    DVT Prophylaxis: Lovenox subcu 30 mg daily  Code Status: Full  Family Communication: Father at bedside with  permission on April 4  Disposition Plan:    Patient came from:          home                                                                                                Anticipated d/c place: home  Barriers to d/c OR conditions which need to be met to effect a safe d/c:  Not ready to discharge, continue nausea vomiting ,not able to tolerate oral intake ,worsening renal function   Consultants:  None  Procedures:  Insulin drip  Antibiotics:  Rocephin   Objective: BP 131/85   Pulse 99   Temp 98 F (36.7 C) (Oral)   Resp 13   Ht 5' 5"  (1.651 m)   Wt 75.3 kg   LMP 11/25/2019   SpO2 98%   BMI 27.62 kg/m   Intake/Output Summary (Last 24 hours) at 12/01/2019 1100 Last data filed at 12/01/2019 0729 Gross per 24 hour  Intake 644.15 ml  Output --  Net  644.15 ml   Filed Weights   11/28/19 1707  Weight: 75.3 kg    Exam: Patient is examined daily including today on 12/01/2019, exams remain the same as of yesterday except that has changed    General: Does not look comfortable  Cardiovascular: RRR  Respiratory: CTABL  Abdomen: Tenderness left quadrant , no guarding , no rebound , positive BS  Musculoskeletal: No Edema  Neuro: alert, oriented   Data Reviewed: Basic Metabolic Panel: Recent Labs  Lab 11/29/19 1330 11/29/19 1759 11/29/19 2105 11/30/19 0500 12/01/19 0805  NA 139 143 143 141 146*  K 4.6 4.1 3.9 3.7 3.5  CL 106 110 112* 110 116*  CO2 16* 18* 19* 18* 19*  GLUCOSE 358* 143* 146* 261* 189*  BUN 20 20 21* 21* 20  CREATININE 2.03* 2.18* 2.21* 2.65* 2.92*  CALCIUM 8.6* 8.6* 8.6* 8.5* 8.1*   Liver Function  Tests: Recent Labs  Lab 11/28/19 1725 12/01/19 0805  AST 15 11*  ALT 14 12  ALKPHOS 146* 131*  BILITOT 0.7 0.2*  PROT 6.2* 5.4*  ALBUMIN 2.4* 2.1*   Recent Labs  Lab 11/28/19 1725  LIPASE 20   No results for input(s): AMMONIA in the last 168 hours. CBC: Recent Labs  Lab 11/28/19 1725 11/29/19 0354 12/01/19 0805  WBC 12.3* 15.2* 12.8*  HGB 10.6* 10.5* 9.6*  HCT 32.5* 32.2* 28.5*  MCV 78.9* 79.7* 76.2*  PLT 358 324 355   Cardiac Enzymes:   No results for input(s): CKTOTAL, CKMB, CKMBINDEX, TROPONINI in the last 168 hours. BNP (last 3 results) No results for input(s): BNP in the last 8760 hours.  ProBNP (last 3 results) No results for input(s): PROBNP in the last 8760 hours.  CBG: Recent Labs  Lab 11/30/19 1557 11/30/19 1952 11/30/19 2335 12/01/19 0350 12/01/19 0743  GLUCAP 185* 205* 217* 169* 168*    Recent Results (from the past 240 hour(s))  SARS CORONAVIRUS 2 (TAT 6-24 HRS) Nasopharyngeal Nasopharyngeal Swab     Status: None   Collection Time: 11/29/19  1:07 AM   Specimen: Nasopharyngeal Swab  Result Value Ref Range Status   SARS Coronavirus 2 NEGATIVE NEGATIVE Final    Comment: (NOTE) SARS-CoV-2 target nucleic acids are NOT DETECTED. The SARS-CoV-2 RNA is generally detectable in upper and lower respiratory specimens during the acute phase of infection. Negative results do not preclude SARS-CoV-2 infection, do not rule out co-infections with other pathogens, and should not be used as the sole basis for treatment or other patient management decisions. Negative results must be combined with clinical observations, patient history, and epidemiological information. The expected result is Negative. Fact Sheet for Patients: SugarRoll.be Fact Sheet for Healthcare Providers: https://www.woods-mathews.com/ This test is not yet approved or cleared by the [place] FDA and  has been authorized for detection and/or  diagnosis of SARS-CoV-2 by FDA under an Emergency Use Authorization (EUA). This EUA will remain  in effect (meaning this test can be used) for the duration of the COVID-19 declaration under Section 56 4(b)(1) of the Act, 21 U.S.C. section 360bbb-3(b)(1), unless the authorization is terminated or revoked sooner. Performed at [facility], [place] [address] [place], [place] [address]      Studies: No results found.  Scheduled Meds: . enoxaparin (LOVENOX) injection  40 mg Subcutaneous Q24H  . hydrALAZINE  20 mg Intravenous Q6H  . insulin pump   Subcutaneous Q4H  . metoprolol tartrate  50 mg Oral BID  . sodium chloride flush  10-40 mL  Intracatheter Q12H  . sodium chloride flush  3 mL Intravenous Once    Continuous Infusions: . cefTRIAXone (ROCEPHIN)  IV Stopped (12/01/19 0700)  . erythromycin 250 mg (12/01/19 0522)  . lactated ringers with kcl       Time spent: 33mns I have personally reviewed and interpreted on  12/01/2019 daily labs, tele strips, imagings as discussed above under date review session and assessment and plans.  I reviewed all nursing notes, pharmacy notes  vitals, pertinent old records  I have discussed plan of care as described above with RN , patient and family on 12/01/2019   FFlorencia ReasonsMD, PhD, FACP  [facility]  Available via Epic secure chat 7am-7pm for nonurgent issues Please page for urgent issues, pager number available through [email or web address] .   12/01/2019, 11:00 AM  LOS: 1 day

## 2019-12-02 ENCOUNTER — Inpatient Hospital Stay (HOSPITAL_COMMUNITY): Payer: BC Managed Care – PPO

## 2019-12-02 LAB — GLUCOSE, CAPILLARY
Glucose-Capillary: 133 mg/dL — ABNORMAL HIGH (ref 70–99)
Glucose-Capillary: 103 mg/dL — ABNORMAL HIGH (ref 70–99)
Glucose-Capillary: 143 mg/dL — ABNORMAL HIGH (ref 70–99)
Glucose-Capillary: 119 mg/dL — ABNORMAL HIGH (ref 70–99)
Glucose-Capillary: 126 mg/dL — ABNORMAL HIGH (ref 70–99)

## 2019-12-02 LAB — BASIC METABOLIC PANEL
Anion gap: 11 (ref 5–15)
BUN: 21 mg/dL — ABNORMAL HIGH (ref 6–20)
CO2: 18 mmol/L — ABNORMAL LOW (ref 22–32)
Calcium: 8.3 mg/dL — ABNORMAL LOW (ref 8.9–10.3)
Chloride: 119 mmol/L — ABNORMAL HIGH (ref 98–111)
Creatinine, Ser: 2.9 mg/dL — ABNORMAL HIGH (ref 0.44–1.00)
GFR calc Af Amer: 24 mL/min — ABNORMAL LOW (ref >60)
GFR calc non Af Amer: 21 mL/min — ABNORMAL LOW (ref >60)
Glucose, Bld: 115 mg/dL — ABNORMAL HIGH (ref 70–99)
Potassium: 3.7 mmol/L (ref 3.5–5.1)
Sodium: 148 mmol/L — ABNORMAL HIGH (ref 135–145)

## 2019-12-02 LAB — CBC WITH DIFFERENTIAL/PLATELET
Abs Immature Granulocytes: 0.18 10*3/uL — ABNORMAL HIGH (ref 0.00–0.07)
Basophils Absolute: 0.1 10*3/uL (ref 0.0–0.1)
Basophils Relative: 1 %
Eosinophils Absolute: 0.2 10*3/uL (ref 0.0–0.5)
Eosinophils Relative: 2 %
HCT: 30.9 % — ABNORMAL LOW (ref 36.0–46.0)
Hemoglobin: 10.2 g/dL — ABNORMAL LOW (ref 12.0–15.0)
Immature Granulocytes: 2 %
Lymphocytes Relative: 31 %
Lymphs Abs: 3.2 10*3/uL (ref 0.7–4.0)
MCH: 25.8 pg — ABNORMAL LOW (ref 26.0–34.0)
MCHC: 33 g/dL (ref 30.0–36.0)
MCV: 78 fL — ABNORMAL LOW (ref 80.0–100.0)
Monocytes Absolute: 0.7 10*3/uL (ref 0.1–1.0)
Monocytes Relative: 7 %
Neutro Abs: 5.7 10*3/uL (ref 1.7–7.7)
Neutrophils Relative %: 57 %
Platelets: 363 10*3/uL (ref 150–400)
RBC: 3.96 MIL/uL (ref 3.87–5.11)
RDW: 14 % (ref 11.5–15.5)
WBC: 10 10*3/uL (ref 4.0–10.5)
nRBC: 0 % (ref 0.0–0.2)

## 2019-12-02 NOTE — Plan of Care (Signed)
°  Problem: Clinical Measurements: °Goal: Ability to maintain clinical measurements within normal limits will improve °Outcome: Progressing °  °Problem: Activity: °Goal: Risk for activity intolerance will decrease °Outcome: Progressing °  °Problem: Nutrition: °Goal: Adequate nutrition will be maintained °Outcome: Progressing °  °

## 2019-12-02 NOTE — Progress Notes (Signed)
PROGRESS NOTE  Kimberly [name] [id number] DOB: 18-May-1991 DOA: 11/28/2019 PCP: Jacelyn Pi, MD  Brief History   Kimberly N Evansis a 29 y.o.femalewith medical history significant ofDM1, gastroparesis.  Pt presents to ED with c/o abd pain, N/V, diarrhea, L sided flank pain. Symptoms onset this morning. Some associated dysuria. No fevers nor chills.  In the ED the patient was found to have aUA suggestive of UTI, started on rocephin. She has a stated allergy to Reglan.  Creat 2.90 up from 1.1 baseline and 1.69 at admission.   N/V persists despite dilaudid, zofran, phenergan.  Consultants  . None  Procedures  . None  Antibiotics   Anti-infectives (From admission, onward)   Start     Dose/Rate Route Frequency Ordered Stop   11/30/19 2000  cefTRIAXone (ROCEPHIN) 1 g in sodium chloride 0.9 % 100 mL IVPB     1 g 200 mL/hr over 30 Minutes Intravenous Every 24 hours 11/29/19 0113     11/29/19 0130  erythromycin 250 mg in sodium chloride 0.9 % 100 mL IVPB     250 mg 100 mL/hr over 60 Minutes Intravenous Every 6 hours 11/29/19 0112     11/28/19 1945  cefTRIAXone (ROCEPHIN) 1 g in sodium chloride 0.9 % 100 mL IVPB     1 g 200 mL/hr over 30 Minutes Intravenous  Once 11/28/19 1936 11/28/19 2140    .   Subjective  The patient is resting in bed, she continues to complain of nausea and vomiting. She is also complaining of left sided abdominal pain that wraps around to her left flank.   Objective   Vitals:  Vitals:   12/02/19 1146 12/02/19 1232  BP: (!) 145/88 116/73  Pulse:    Resp: 19 16  Temp:  98.1 F (36.7 C)  SpO2: 99% 98%   Exam:  Constitutional:  . The patient is awake, alert, and oriented x 3. Mild distress from nausea and vomiting. Respiratory:  . No increased work of breathing. . No wheezes, rales, or rhonchi . No tactile fremitus Cardiovascular:  . Regular rate and rhythm . No murmurs, ectopy, or gallups. . No lateral PMI. No thrills.  Abdomen:  . Abdomen is soft, somewhat distended, and tender. . No hernias, masses, or organomegaly . Hypoactive bowel sounds.  Musculoskeletal:  . No cyanosis, clubbing, or edema Skin:  . No rashes, lesions, ulcers . palpation of skin: no induration or nodules Neurologic:  . CN 2-12 intact . Sensation all 4 extremities intact Psychiatric:  . Mental status o Mood, affect appropriate o Orientation to person, place, time  . judgment and insight appear intact  I have personally reviewed the following:   Today's Data  . Vitals, CBC, BMP  Micro Data  . Urine culture  Imaging  . Renal ultrasound . CT abdomen and pelvis.  Scheduled Meds: . enoxaparin (LOVENOX) injection  30 mg Subcutaneous Q24H  . hydrALAZINE  20 mg Intravenous Q6H  . insulin pump   Subcutaneous Q4H  . metoprolol tartrate  50 mg Oral BID  . sodium chloride flush  10-40 mL Intracatheter Q12H   Continuous Infusions: . cefTRIAXone (ROCEPHIN)  IV 200 mL/hr at 12/01/19 2043  . erythromycin Stopped (12/02/19 1251)  . lactated ringers with kcl 75 mL/hr at 12/02/19 1400    Principal Problem:   Intractable nausea and vomiting Active Problems:   Type 1 diabetes mellitus with diabetic nephropathy (HCC)   DKA, type 1 (HCC)   Gastroparesis   Acute lower UTI  AKI (acute kidney injury) ([place])   HTN (hypertension)   Overweight (BMI 25.0-29.9)   LOS: 2 days   A & P  Intractable nausea and vomiting: Secondary to DKA and gastroparesis. The patient continues to have nausea and vomiting. She states that she can't keep anything down. Will check abdominal film.  Type 1 diabetes mellitus with diabetic nephropathy (HCC) and gastroparesis: Nausea may be from either gastroparesis versus DKA.  Continue clear liquids for now, symptomatically treat. Glucoses with improved control 115 - 185 over the last 24 hours.   DKA, type 1 ([place]): DKA resolved.  Follow basic metabolic panel, continue gentle IV fluids.  Patient adjusting  insulin as per pump. Nausea and vomiting persists.  Gastroparesis: The patient has a stated allergy to Reglan. Continue scheduled erythromycin for motility. Consider neostigmine. Glucoses are controlled.   Acute lower UTI: Currently on IV Rocephin. Urine culture has had only insignificant growth. Will check procalcitonin.   AKI (acute kidney injury) ([place]): Secondary to DKA and with fluids. Creatinine increased to 2.90 from baseline of 1.1. Monitor.  HTN (hypertension): Improved this afternoon. Monitor. She is receiving scheduled hydralazine, as needed labetalol, and home metoprolol. Holding lisinopril due to renal dysfunction.  Overweight (BMI 25.0-29.9): Meets criteria for BMI greater than 25  Disposition: Potential discharge tomorrow if she is tolerating advancement of diet  I have seen and examined this patient myself. I have spent 30 minutes in her evaluation and care.  [name], DO [facility] Direct contact: see [email or web address]  7PM-7AM contact night coverage as above 12/02/2019, 4:48 PM  LOS: 2 days

## 2019-12-03 ENCOUNTER — Inpatient Hospital Stay (HOSPITAL_COMMUNITY): Payer: BC Managed Care – PPO

## 2019-12-03 LAB — CBC WITH DIFFERENTIAL/PLATELET
Abs Immature Granulocytes: 0.17 10*3/uL — ABNORMAL HIGH (ref 0.00–0.07)
Basophils Absolute: 0.1 10*3/uL (ref 0.0–0.1)
Basophils Relative: 1 %
Eosinophils Absolute: 0.3 10*3/uL (ref 0.0–0.5)
Eosinophils Relative: 3 %
HCT: 29.1 % — ABNORMAL LOW (ref 36.0–46.0)
Hemoglobin: 9.8 g/dL — ABNORMAL LOW (ref 12.0–15.0)
Immature Granulocytes: 2 %
Lymphocytes Relative: 37 %
Lymphs Abs: 3.6 10*3/uL (ref 0.7–4.0)
MCH: 26.1 pg (ref 26.0–34.0)
MCHC: 33.7 g/dL (ref 30.0–36.0)
MCV: 77.4 fL — ABNORMAL LOW (ref 80.0–100.0)
Monocytes Absolute: 0.8 10*3/uL (ref 0.1–1.0)
Monocytes Relative: 8 %
Neutro Abs: 4.9 10*3/uL (ref 1.7–7.7)
Neutrophils Relative %: 49 %
Platelets: 307 10*3/uL (ref 150–400)
RBC: 3.76 MIL/uL — ABNORMAL LOW (ref 3.87–5.11)
RDW: 14.3 % (ref 11.5–15.5)
WBC: 9.9 10*3/uL (ref 4.0–10.5)
nRBC: 0 % (ref 0.0–0.2)

## 2019-12-03 LAB — BASIC METABOLIC PANEL
Anion gap: 10 (ref 5–15)
BUN: 18 mg/dL (ref 6–20)
CO2: 19 mmol/L — ABNORMAL LOW (ref 22–32)
Calcium: 8.4 mg/dL — ABNORMAL LOW (ref 8.9–10.3)
Chloride: 119 mmol/L — ABNORMAL HIGH (ref 98–111)
Creatinine, Ser: 2.69 mg/dL — ABNORMAL HIGH (ref 0.44–1.00)
GFR calc Af Amer: 26 mL/min — ABNORMAL LOW (ref >60)
GFR calc non Af Amer: 23 mL/min — ABNORMAL LOW (ref >60)
Glucose, Bld: 136 mg/dL — ABNORMAL HIGH (ref 70–99)
Potassium: 3.7 mmol/L (ref 3.5–5.1)
Sodium: 148 mmol/L — ABNORMAL HIGH (ref 135–145)

## 2019-12-03 LAB — GLUCOSE, CAPILLARY
Glucose-Capillary: 92 mg/dL (ref 70–99)
Glucose-Capillary: 89 mg/dL (ref 70–99)
Glucose-Capillary: 267 mg/dL — ABNORMAL HIGH (ref 70–99)

## 2019-12-03 MED ORDER — TECHNETIUM TC 99M MEBROFENIN IV KIT
5.3000 | PACK | Freq: Once | INTRAVENOUS | Status: AC | PRN
  Administered 2019-12-03: 5.3 via INTRAVENOUS

## 2019-12-03 NOTE — Plan of Care (Signed)
  Problem: Education: Goal: Knowledge of General Education information will improve Description: Including pain rating scale, medication(s)/side effects and non-pharmacologic comfort measures Outcome: Completed/Met   Problem: Health Behavior/Discharge Planning: Goal: Ability to manage health-related needs will improve Outcome: Completed/Met   Problem: Clinical Measurements: Goal: Ability to maintain clinical measurements within normal limits will improve Outcome: Completed/Met Goal: Will remain free from infection Outcome: Completed/Met Goal: Diagnostic test results will improve Outcome: Completed/Met Goal: Respiratory complications will improve Outcome: Completed/Met Goal: Cardiovascular complication will be avoided Outcome: Completed/Met   Problem: Activity: Goal: Risk for activity intolerance will decrease Outcome: Completed/Met   Problem: Nutrition: Goal: Adequate nutrition will be maintained Outcome: Completed/Met   Problem: Coping: Goal: Level of anxiety will decrease Outcome: Completed/Met   Problem: Elimination: Goal: Will not experience complications related to bowel motility Outcome: Completed/Met Goal: Will not experience complications related to urinary retention Outcome: Completed/Met   Problem: Pain Managment: Goal: General experience of comfort will improve Outcome: Completed/Met   Problem: Safety: Goal: Ability to remain free from injury will improve Outcome: Completed/Met   Problem: Skin Integrity: Goal: Risk for impaired skin integrity will decrease Outcome: Completed/Met  Discharge instructions reviewed with patient.  These included, but were not limited to, the following:  medications, dietary recommendations, when to call the MD, follow-up appointments, questions re:  admitting diagnosis, questions re:  diabetes, etc.  Patient discharged to private residence via private vehicle.  Escorted to exit via wheelchair by nurse tech.

## 2019-12-04 NOTE — Discharge Summary (Addendum)
Physician Discharge Summary  [name] DXI:[id number] DOB: 11/30/1990 DOA: 11/28/2019  PCP: Jacelyn Pi, MD  Admit date: 11/28/2019 Discharge date: 12/04/2019  Recommendations for Outpatient Follow-up:  1. Discharge to home 2. Follow up with PCP within one week. 3. Call [facility] for nearest appointment. Reason Acute Kidney Injury. 4. Follow up with [facility]: Reason evaluation of chronic cholecystitis. 5. Pt should have chemistry drawn at visit with PCP.   Discharge Diagnoses: Principal diagnosis is #1 1. Intractable nausea and vomiting. 2. Diabetic gastroparesis 3. Epigastric abdominal pain 4. DM I with diabetic nephropathy 5. Acute UTI, no evidence for pyelonephritis 6. Hypertension 7. Chronic Cholecystitis 8. AKI on CKD II  Discharge Condition: Fair  Disposition: home  Diet recommendation: Heart healthy/Carbohydrate modified  Filed Weights   11/28/19 1707  Weight: 75.3 kg    History of present illness:  HPI: Kimberly [name] is a 29 y.o. female with medical history significant of DM1, gastroparesis. Pt presents to ED with c/o abd pain, N/V, diarrhea, L sided flank pain.  Symptoms onset this morning.  Some associated dysuria.  No fevers nor chills. In the ED the patient was found to have a UA suggestive of UTI, started on rocephin. It was also felt that she was experiencing an exacerbation of her diabetic gastroparesis. This is complicated by the patient's stated allergy to Reglan. Her creatinine was elevated to 1.69 from baseline 1.1. her nausea and vomiting was intractable and did not respond to zofran or phenergan. She was given dilaudid for abdominal pain.  Hospital Course:  [facility] were consulted to admit the patient for further evaluation and treatment. She was admitted to a telemetry bed. She was given IV fluids, antiemetics, pain control, and continued IV antibiotics. CT of the abdomen and pelvis  demonstrated only cholelithiasis without complicating factors. Renal ultrasound was negative for hydronephrosis. HIDA scan suggested chronic cholelithiasis. The patient's creatinine continued to rise throughout most of her inpatient with a maximum creatinine of 2.92. Her lisinopril had been stopped. On the day of discharge it had come down to 2.69. I discussed the patient with Dr. Carolin Sicks. She will follow up with [facility] as outpatient. I have also recommended that she follow up with [facility] regarding her chronic cholecystitis.  She has completed her IV antibiotics for UTI.  Today's assessment: S: The patient is resting comfortably. She states that she is able to eat without vomiting and wants to go home. O: Vitals:  Vitals:   12/03/19 0052 12/03/19 0629  BP: 135/89 (!) 148/85  Pulse:    Resp:    Temp:    SpO2:     Exam:  Constitutional:   The patient is awake, alert, and oriented x 3. Mild distress from nausea and vomiting. Respiratory:   No increased work of breathing.  No wheezes, rales, or rhonchi  No tactile fremitus Cardiovascular:   Regular rate and rhythm  No murmurs, ectopy, or gallups.  No lateral PMI. No thrills. Abdomen:   Abdomen is soft, non-tender, non-distended  No hernias, masses, or organomegaly  Hypoactive bowel sounds.  Musculoskeletal:   No cyanosis, clubbing, or edema Skin:   No rashes, lesions, ulcers  palpation of skin: no induration or nodules Neurologic:   CN 2-12 intact  Sensation all 4 extremities intact Psychiatric:   Mental status ? Mood, affect appropriate ? Orientation to person, place, time   judgment and insight appear intact   Discharge Instructions  Discharge Instructions  Activity as tolerated - No restrictions   Complete by: As directed    Call MD for:  difficulty breathing, headache or visual disturbances   Complete by: As directed    Call MD for:  persistant nausea and vomiting   Complete by:  As directed    Call MD for:  temperature >100.4   Complete by: As directed    Diet - low sodium heart healthy   Complete by: As directed    Discharge instructions   Complete by: As directed    Discharge to home Follow up with PCP within one week. Call [facility] for nearest appointment. Reason Acute Kidney Injury. Follow up with [facility]: Reason evaluation of chronic cholecystitis. Pt should have chemistry drawn at visit with PCP. Do not take any NSAIDS.   Increase activity slowly   Complete by: As directed      Allergies as of 12/03/2019      Reactions   Reglan [metoclopramide] Other (See Comments)   tardive dyskinesia per outside provider note      Medication List    STOP taking these medications   lisinopril 20 MG tablet Commonly known as: ZESTRIL     TAKE these medications   Bayer Contour Next Test test strip Generic drug: glucose blood USE AS DIRECTED TO TEST 6 TO 8 TIMES DAILY What changed: See the new instructions.   CONTOUR NEXT EZ MONITOR w/Device Kit 1 each by Does not apply route 6 (six) times daily. Provide lancets for testing six times daily. Type I DM with diabetes on Insulin Pump  DX 648.03   insulin lispro 100 UNIT/ML injection Commonly known as: HUMALOG Inject 3-6 Units into the skin 3 (three) times daily before meals. Via insulin pump; INJECT 100 UNITS MAX DAILY DOSEAGE VIA INSULIN PUMP UNDER THE SKIN   insulin pump Soln Inject into the skin. Humalog   metoprolol tartrate 50 MG tablet Commonly known as: LOPRESSOR Take 1 tablet (50 mg total) by mouth 2 (two) times daily.   Nexplanon 68 MG Impl implant Generic drug: etonogestrel Inject 1 each into the skin once.      Allergies  Allergen Reactions  . Reglan [Metoclopramide] Other (See Comments)    tardive dyskinesia per outside provider note    The results of significant diagnostics from this hospitalization (including imaging, microbiology, ancillary and  laboratory) are listed below for reference.    Significant Diagnostic Studies: DG Abd 1 View  Result Date: 12/02/2019 CLINICAL DATA:  Nausea and vomiting, history diabetes mellitus EXAM: ABDOMEN - 1 VIEW COMPARISON:  Portable exam 2020 hours compared to 08/29/2018 FINDINGS: Scattered gas throughout colon. Air-filled small bowel loops in mid abdomen, nondistended. No bowel wall thickening or evidence of obstruction. Osseous structures unremarkable. Scattered pelvic phleboliths. No urinary tract calcification. IMPRESSION: Nonobstructive bowel gas pattern. Electronically Signed   By: Lavonia Dana M.D.   On: 12/02/2019 20:30   CT ABDOMEN PELVIS W CONTRAST  Result Date: 11/29/2019 CLINICAL DATA:  Left lower quadrant pain EXAM: CT ABDOMEN AND PELVIS WITH CONTRAST TECHNIQUE: Multidetector CT imaging of the abdomen and pelvis was performed using the standard protocol following bolus administration of intravenous contrast. CONTRAST:  67m OMNIPAQUE IOHEXOL 300 MG/ML  SOLN COMPARISON:  03/19/2018 FINDINGS: Lower chest: No acute abnormality. Hepatobiliary: Liver is within normal limits. Gallbladder is well distended with small dependent gallstones. No biliary ductal dilatation is seen. Pancreas: Unremarkable. No pancreatic ductal dilatation or surrounding inflammatory changes. Spleen: Normal in size without focal abnormality.  Adrenals/Urinary Tract: Adrenal glands are within normal limits. Kidneys demonstrate a normal enhancement pattern bilaterally. No renal calculi or obstructive changes are seen. The bladder is well distended. Stomach/Bowel: Stomach is within normal limits. Appendix appears normal. No evidence of bowel wall thickening, distention, or inflammatory changes. Vascular/Lymphatic: No significant vascular findings are present. No enlarged abdominal or pelvic lymph nodes. Reproductive: Uterus and bilateral adnexa are unremarkable. Other: No abdominal wall hernia or abnormality. No abdominopelvic ascites.  Musculoskeletal: No acute bony abnormality is noted. Scarring is noted in the anterior subcutaneous tissues likely related to prior injection sites. This is stable from the prior exam. IMPRESSION: Cholelithiasis without complicating factors. No other focal abnormality is noted. Electronically Signed   By: Inez Catalina M.D.   On: 11/29/2019 00:02   US RENAL  Result Date: 12/01/2019 CLINICAL DATA:  Creatinine elevation EXAM: RENAL / URINARY TRACT ULTRASOUND COMPLETE COMPARISON:  2016 FINDINGS: Right Kidney: Renal measurements: 10.1 x 4.8 x 5.9 cm = volume: 148.5 mL . Echogenicity within normal limits. No mass or hydronephrosis visualized. Left Kidney: Renal measurements: 10 x 5.6 x 5.5 cm = volume: 159 mL. Echogenicity within normal limits. No mass or hydronephrosis visualized. Bladder: Appears normal for degree of bladder distention. Other: None. IMPRESSION: No hydronephrosis Electronically Signed   By: Macy Mis M.D.   On: 12/01/2019 15:05   NM Hepato W/EF  Result Date: 12/03/2019 CLINICAL DATA:  28 year old female with abdominal pain nausea vomiting and diarrhea EXAM: NUCLEAR MEDICINE HEPATOBILIARY IMAGING TECHNIQUE: Sequential images of the abdomen were obtained out to 60 minutes following intravenous administration of radiopharmaceutical. RADIOPHARMACEUTICALS:  5.3 mCi Tc-53m Choletec IV COMPARISON:  CT of the abdomen and pelvis of 11/28/2019 FINDINGS: Prompt uptake and biliary excretion of activity by the liver is seen. Early images show cystic duct is patent with excretion into the small bowel. The 90 minutes delayed images show faint activity in the gallbladder and continued excretion into small bowel. Given faint activity in the gallbladder EF was not performed. IMPRESSION: No evidence of acute cholecystitis. Delayed filling of the gallbladder may be indicative of chronic cholecystitis. Electronically Signed   By: GZetta BillsM.D.   On: 12/03/2019 11:30    Microbiology: Recent Results (from  the past 240 hour(s))  SARS CORONAVIRUS 2 (TAT 6-24 HRS) Nasopharyngeal Nasopharyngeal Swab     Status: None   Collection Time: 11/29/19  1:07 AM   Specimen: Nasopharyngeal Swab  Result Value Ref Range Status   SARS Coronavirus 2 NEGATIVE NEGATIVE Final    Comment: (NOTE) SARS-CoV-2 target nucleic acids are NOT DETECTED. The SARS-CoV-2 RNA is generally detectable in upper and lower respiratory specimens during the acute phase of infection. Negative results do not preclude SARS-CoV-2 infection, do not rule out co-infections with other pathogens, and should not be used as the sole basis for treatment or other patient management decisions. Negative results must be combined with clinical observations, patient history, and epidemiological information. The expected result is Negative. Fact Sheet for Patients: hSugarRoll.beFact Sheet for Healthcare Providers: hhttps://www.woods-mathews.com/This test is not yet approved or cleared by the [place] and  has been authorized for detection and/or diagnosis of SARS-CoV-2 by FDA under an Emergency Use Authorization (EUA). This EUA will remain  in effect (meaning this test can be used) for the duration of the COVID-19 declaration under Section 56 4(b)(1) of the Act, 21 U.S.C. section 360bbb-3(b)(1), unless the authorization is terminated or revoked sooner. Performed at [facility] [address]  [address], [place], [place] [address]   Urine Culture     Status: Abnormal   Collection Time: 11/29/19 11:45 PM   Specimen: Urine, Random  Result Value Ref Range Status   Specimen Description URINE, RANDOM  Final   Special Requests NONE  Final   Culture (A)  Final    <10,000 COLONIES/mL INSIGNIFICANT GROWTH Performed at [facility], [place] [address] [place],  [address]    Report Status 12/01/2019 FINAL  Final     Labs: Basic Metabolic Panel: Recent Labs  Lab 11/29/19 2105  11/30/19 0500 12/01/19 0805 12/02/19 0725 12/03/19 0153  NA 143 141 146* 148* 148*  K 3.9 3.7 3.5 3.7 3.7  CL 112* 110 116* 119* 119*  CO2 19* 18* 19* 18* 19*  GLUCOSE 146* 261* 189* 115* 136*  BUN 21* 21* 20 21* 18  CREATININE 2.21* 2.65* 2.92* 2.90* 2.69*  CALCIUM 8.6* 8.5* 8.1* 8.3* 8.4*   Liver Function Tests: Recent Labs  Lab 11/28/19 1725 12/01/19 0805  AST 15 11*  ALT 14 12  ALKPHOS 146* 131*  BILITOT 0.7 0.2*  PROT 6.2* 5.4*  ALBUMIN 2.4* 2.1*   Recent Labs  Lab 11/28/19 1725  LIPASE 20   No results for input(s): AMMONIA in the last 168 hours. CBC: Recent Labs  Lab 11/28/19 1725 11/29/19 0354 12/01/19 0805 12/02/19 0725 12/03/19 0153  WBC 12.3* 15.2* 12.8* 10.0 9.9  NEUTROABS  --   --   --  5.7 4.9  HGB 10.6* 10.5* 9.6* 10.2* 9.8*  HCT 32.5* 32.2* 28.5* 30.9* 29.1*  MCV 78.9* 79.7* 76.2* 78.0* 77.4*  PLT 358 324 355 363 307   Cardiac Enzymes: No results for input(s): CKTOTAL, CKMB, CKMBINDEX, TROPONINI in the last 168 hours. BNP: BNP (last 3 results) No results for input(s): BNP in the last 8760 hours.  ProBNP (last 3 results) No results for input(s): PROBNP in the last 8760 hours.  CBG: Recent Labs  Lab 12/02/19 1711 12/02/19 2014 12/03/19 0358 12/03/19 0751 12/03/19 1216  GLUCAP 119* 126* 92 89 267*    Principal Problem:   Intractable nausea and vomiting Active Problems:   Type 1 diabetes mellitus with diabetic nephropathy (HCC)   DKA, type 1 (HCC)   Gastroparesis   Acute lower UTI   AKI (acute kidney injury) ([place])   HTN (hypertension)   Overweight (BMI 25.0-29.9)   Time coordinating discharge: 38 minutes.  Signed:        Shyhiem Beeney, DO [facility]  12/04/2019, 8:07 AM

## 2020-06-03 ENCOUNTER — Emergency Department (HOSPITAL_COMMUNITY)
Admission: EM | Admit: 2020-06-03 | Discharge: 2020-06-03 | Disposition: A | Discharge status: Home / Self Care | Payer: BC Managed Care – PPO | Attending: Emergency Medicine | Admitting: Emergency Medicine

## 2020-06-03 ENCOUNTER — Encounter (HOSPITAL_COMMUNITY): Payer: Self-pay

## 2020-06-03 ENCOUNTER — Emergency Department (HOSPITAL_COMMUNITY): Payer: BC Managed Care – PPO

## 2020-06-03 ENCOUNTER — Other Ambulatory Visit: Payer: Self-pay

## 2020-06-03 DIAGNOSIS — Z79899 Other long term (current) drug therapy: Secondary | ICD-10-CM | POA: Insufficient documentation

## 2020-06-03 DIAGNOSIS — Z20822 Contact with and (suspected) exposure to covid-19: Secondary | ICD-10-CM | POA: Insufficient documentation

## 2020-06-03 DIAGNOSIS — R0789 Other chest pain: Secondary | ICD-10-CM | POA: Diagnosis not present

## 2020-06-03 DIAGNOSIS — E162 Hypoglycemia, unspecified: Secondary | ICD-10-CM | POA: Insufficient documentation

## 2020-06-03 DIAGNOSIS — R5383 Other fatigue: Secondary | ICD-10-CM

## 2020-06-03 DIAGNOSIS — Z794 Long term (current) use of insulin: Secondary | ICD-10-CM | POA: Insufficient documentation

## 2020-06-03 DIAGNOSIS — E1010 Type 1 diabetes mellitus with ketoacidosis without coma: Secondary | ICD-10-CM | POA: Insufficient documentation

## 2020-06-03 DIAGNOSIS — W010XXA Fall on same level from slipping, tripping and stumbling without subsequent striking against object, initial encounter: Secondary | ICD-10-CM | POA: Insufficient documentation

## 2020-06-03 DIAGNOSIS — I10 Essential (primary) hypertension: Secondary | ICD-10-CM | POA: Insufficient documentation

## 2020-06-03 DIAGNOSIS — H538 Other visual disturbances: Secondary | ICD-10-CM | POA: Diagnosis not present

## 2020-06-03 DIAGNOSIS — R5382 Chronic fatigue, unspecified: Secondary | ICD-10-CM | POA: Diagnosis not present

## 2020-06-03 DIAGNOSIS — E1021 Type 1 diabetes mellitus with diabetic nephropathy: Secondary | ICD-10-CM | POA: Diagnosis not present

## 2020-06-03 LAB — URINALYSIS, ROUTINE W REFLEX MICROSCOPIC
Bilirubin Urine: NEGATIVE
Glucose, UA: >=500 mg/dL — AB
Hgb urine dipstick: NEGATIVE
Ketones, ur: NEGATIVE mg/dL
Leukocytes,Ua: NEGATIVE
Nitrite: NEGATIVE
Protein, ur: >=300 mg/dL — AB
Specific Gravity, Urine: 1.008 (ref 1.005–1.030)
pH: 6 (ref 5.0–8.0)

## 2020-06-03 LAB — I-STAT BETA HCG BLOOD, ED (MC, WL, AP ONLY): I-stat hCG, quantitative: <5 m[IU]/mL (ref <5)

## 2020-06-03 LAB — RESPIRATORY PANEL BY RT PCR (FLU A&B, COVID)
Influenza A by PCR: NEGATIVE
Influenza B by PCR: NEGATIVE
SARS Coronavirus 2 by RT PCR: NEGATIVE

## 2020-06-03 LAB — COMPREHENSIVE METABOLIC PANEL
ALT: 14 U/L (ref 0–44)
AST: 22 U/L (ref 15–41)
Albumin: 2.7 g/dL — ABNORMAL LOW (ref 3.5–5.0)
Alkaline Phosphatase: 100 U/L (ref 38–126)
Anion gap: 11 (ref 5–15)
BUN: 35 mg/dL — ABNORMAL HIGH (ref 6–20)
CO2: 21 mmol/L — ABNORMAL LOW (ref 22–32)
Calcium: 8 mg/dL — ABNORMAL LOW (ref 8.9–10.3)
Chloride: 107 mmol/L (ref 98–111)
Creatinine, Ser: 2.73 mg/dL — ABNORMAL HIGH (ref 0.44–1.00)
GFR calc non Af Amer: 22 mL/min — ABNORMAL LOW (ref >60)
Glucose, Bld: 163 mg/dL — ABNORMAL HIGH (ref 70–99)
Potassium: 4.3 mmol/L (ref 3.5–5.1)
Sodium: 139 mmol/L (ref 135–145)
Total Bilirubin: 0.4 mg/dL (ref 0.3–1.2)
Total Protein: 6.7 g/dL (ref 6.5–8.1)

## 2020-06-03 LAB — CBC WITH DIFFERENTIAL/PLATELET
Abs Immature Granulocytes: 0.04 10*3/uL (ref 0.00–0.07)
Basophils Absolute: 0.1 10*3/uL (ref 0.0–0.1)
Basophils Relative: 0 %
Eosinophils Absolute: 0.2 10*3/uL (ref 0.0–0.5)
Eosinophils Relative: 2 %
HCT: 27.7 % — ABNORMAL LOW (ref 36.0–46.0)
Hemoglobin: 9.6 g/dL — ABNORMAL LOW (ref 12.0–15.0)
Immature Granulocytes: 0 %
Lymphocytes Relative: 21 %
Lymphs Abs: 2.5 10*3/uL (ref 0.7–4.0)
MCH: 25.6 pg — ABNORMAL LOW (ref 26.0–34.0)
MCHC: 34.7 g/dL (ref 30.0–36.0)
MCV: 73.9 fL — ABNORMAL LOW (ref 80.0–100.0)
Monocytes Absolute: 0.8 10*3/uL (ref 0.1–1.0)
Monocytes Relative: 7 %
Neutro Abs: 8.1 10*3/uL — ABNORMAL HIGH (ref 1.7–7.7)
Neutrophils Relative %: 70 %
Platelets: 320 10*3/uL (ref 150–400)
RBC: 3.75 MIL/uL — ABNORMAL LOW (ref 3.87–5.11)
RDW: 16 % — ABNORMAL HIGH (ref 11.5–15.5)
WBC: 11.7 10*3/uL — ABNORMAL HIGH (ref 4.0–10.5)
nRBC: 0 % (ref 0.0–0.2)

## 2020-06-03 LAB — CBG MONITORING, ED
Glucose-Capillary: 106 mg/dL — ABNORMAL HIGH (ref 70–99)
Glucose-Capillary: 171 mg/dL — ABNORMAL HIGH (ref 70–99)
Glucose-Capillary: 25 mg/dL — CL (ref 70–99)
Glucose-Capillary: 185 mg/dL — ABNORMAL HIGH (ref 70–99)
Glucose-Capillary: 303 mg/dL — ABNORMAL HIGH (ref 70–99)

## 2020-06-03 MED ORDER — SODIUM CHLORIDE 0.9 % IV BOLUS
500.0000 mL | Freq: Once | INTRAVENOUS | Status: AC
  Administered 2020-06-03: 500 mL via INTRAVENOUS

## 2020-06-03 MED ORDER — DEXTROSE 50 % IV SOLN
50.0000 mL | Freq: Once | INTRAVENOUS | Status: AC
  Administered 2020-06-03: 50 mL via INTRAVENOUS
  Filled 2020-06-03: qty 50

## 2020-06-03 NOTE — ED Provider Notes (Signed)
Kimberly [name]   CSN: [id number] Arrival date & time: 06/03/20  0747     History Chief Complaint  Patient presents with  . Fatigue  . Fall    Kimberly [name] is a 29 y.o. female presenting for evaluation of fatigue, unsteady gait, and fall.  Patient states the past 2 weeks, she has been feeling unsteady when she walks.  For the past 4 days she has been having significant fatigue, gradually worsening each day.  3 days ago her blood sugar was low, causing her to fall and hit the left side of her head.  She states she may have passed out for a second or 2 before coming to.  After drinking orange juice and correcting her blood sugar, no further falling or blood sugar abnormality.  She reports mild soreness of the left side of her head from the fall.  She reports chest tightness blurred vision which has been going on for several days.  She saw her PCP recently, was found to have low B12, but has not received medication for this yet.  She denies recent fevers, chills, dizziness, cough, chest pain, shortness of breath, nausea, vomiting abdominal pain, urinary symptoms, normal bowel movements.  She states her blood pressure has been elevated recently, her PCP is working on controlling this.  She is currently on amlodipine and metoprolol, she has been taking them as prescribed.  She has taken her blood pressure medication today.  No other new medications or medication changes.    HPI     Past Medical History:  Diagnosis Date  . Diabetes mellitus without complication ([place])   . Preeclampsia   . Preeclampsia without severe features   . Sickle cell trait [facility])     Patient Active Problem List   Diagnosis Date Noted  . Overweight (BMI 25.0-29.9) 11/30/2019  . Acute lower UTI 11/29/2019  . AKI (acute kidney injury) ([place]) 11/29/2019  . HTN (hypertension) 11/29/2019  . Hypokalemia 09/06/2018  . Hypertensive urgency 09/06/2018  . Leukocytosis  09/06/2018  . GERD (gastroesophageal reflux disease) 09/06/2018  . Gastroparesis   . Intractable nausea and vomiting 08/29/2018  . Microcytic anemia 08/29/2018  . CIN III (cervical intraepithelial neoplasia grade III) with severe dysplasia 09/09/2017  . DKA, type 1 ([place]) 03/17/2015  . Diabetic nephropathy associated with type 1 diabetes mellitus ([place]) 03/17/2015  . Type 1 diabetes mellitus with diabetic nephropathy ([place]) 05/25/2013    Past Surgical History:  Procedure Laterality Date  . ESOPHAGOGASTRODUODENOSCOPY  08/2018   [facility] and a gastric enptying study   . WISDOM TOOTH EXTRACTION       OB History    Gravida  1   Para  1   Term      Preterm  1   AB      Living  1     SAB      TAB      Ectopic      Multiple      Live Births  1           Family History  Problem Relation Age of Onset  . Lupus Mother   . Aneurysm Mother   . Colon polyps Maternal Aunt        great aunt  . Colon cancer Neg Hx   . Esophageal cancer Neg Hx     Social History   Tobacco Use  . Smoking status: Never Smoker  . Smokeless tobacco: Never Used  Vaping Use  . Vaping Use: Never used  Substance Use Topics  . Alcohol use: No  . Drug use: No    Home Medications Prior to Admission medications   Medication Sig Start Date End Date Taking? Authorizing Provider  BAYER CONTOUR NEXT TEST test strip USE AS DIRECTED TO TEST 6 TO 8 TIMES DAILY Patient taking differently: 1 each by Other route.  10/01/14   Woodroe Mode, MD  Blood Glucose Monitoring Suppl (CONTOUR NEXT EZ MONITOR) W/DEVICE KIT 1 each by Does not apply route 6 (six) times daily. Provide lancets for testing six times daily. Type I DM with diabetes on Insulin Pump  DX 648.03 06/17/13   Woodroe Mode, MD  etonogestrel (NEXPLANON) 68 MG IMPL implant Inject 1 each into the skin once.    [provider]  Insulin Human (INSULIN PUMP) SOLN Inject into the skin. Humalog    [provider]   insulin lispro (HUMALOG) 100 UNIT/ML injection Inject 3-6 Units into the skin 3 (three) times daily before meals. Via insulin pump; INJECT 100 UNITS MAX DAILY DOSEAGE VIA INSULIN PUMP UNDER THE SKIN    [provider]  metoprolol tartrate (LOPRESSOR) 50 MG tablet Take 1 tablet (50 mg total) by mouth 2 (two) times daily. 03/23/18   Doreatha Lew, MD    Allergies    Reglan [metoclopramide]  Review of Systems   Review of Systems  Constitutional: Positive for fatigue.  Respiratory: Positive for chest tightness.   Musculoskeletal: Positive for gait problem.  All other systems reviewed and are negative.   Physical Exam Updated Vital Signs BP (!) 175/113   Pulse 98   Temp 98.7 F (37.1 C) (Oral)   Resp 18   Ht _0  (1.651 m)   Wt 73.9 kg   LMP 05/11/2020   SpO2 100%   BMI 27.11 kg/m   Physical Exam Vitals and nursing [name] reviewed.  Constitutional:      General: She is not in acute distress.    Appearance: She is well-developed.     Comments: Appears nontoxic  HENT:     Head: Normocephalic and atraumatic.  Eyes:     Extraocular Movements: Extraocular movements intact.     Conjunctiva/sclera: Conjunctivae normal.     Pupils: Pupils are equal, round, and reactive to light.  Cardiovascular:     Rate and Rhythm: Regular rhythm. Tachycardia present.     Pulses: Normal pulses.     Comments: Hr 110 Pulmonary:     Effort: Pulmonary effort is normal. No respiratory distress.     Breath sounds: Normal breath sounds. No wheezing.     Comments: ctab Abdominal:     General: There is no distension.     Palpations: Abdomen is soft. There is no mass.     Tenderness: There is no abdominal tenderness. There is no guarding or rebound.  Musculoskeletal:        General: Normal range of motion.     Cervical back: Normal range of motion and neck supple.     Right lower leg: No edema.     Left lower leg: No edema.  Skin:    General: Skin is warm and dry.     Capillary  Refill: Capillary refill takes less than 2 seconds.  Neurological:     Mental Status: She is alert and oriented to person, place, and time.     GCS: GCS eye subscore is 4. GCS verbal subscore is 5. GCS motor subscore  is 6.     Sensory: Sensation is intact.     Motor: Motor function is intact.     Gait: Gait abnormal.     Comments: Shuffling gait, no ataxia. Pt walking without listing to one side or concerns for falling     ED Results / Procedures / Treatments   Labs (all labs ordered are listed, but only abnormal results are displayed) Labs Reviewed  CBC WITH DIFFERENTIAL/PLATELET - Abnormal; Notable for the following components:      Result Value   WBC 11.7 (*)    RBC 3.75 (*)    Hemoglobin 9.6 (*)    HCT 27.7 (*)    MCV 73.9 (*)    MCH 25.6 (*)    RDW 16.0 (*)    Neutro Abs 8.1 (*)    All other components within normal limits  URINALYSIS, ROUTINE W REFLEX MICROSCOPIC - Abnormal; Notable for the following components:   Glucose, UA >=500 (*)    Protein, ur >=300 (*)    Bacteria, UA RARE (*)    All other components within normal limits  COMPREHENSIVE METABOLIC PANEL - Abnormal; Notable for the following components:   CO2 21 (*)    Glucose, Bld 163 (*)    BUN 35 (*)    Creatinine, Ser 2.73 (*)    Calcium 8.0 (*)    Albumin 2.7 (*)    GFR calc non Af Amer 22 (*)    All other components within normal limits  CBG MONITORING, ED - Abnormal; Notable for the following components:   Glucose-Capillary 106 (*)    All other components within normal limits  CBG MONITORING, ED - Abnormal; Notable for the following components:   Glucose-Capillary 19 (*)    All other components within normal limits  CBG MONITORING, ED - Abnormal; Notable for the following components:   Glucose-Capillary 25 (*)    All other components within normal limits  CBG MONITORING, ED - Abnormal; Notable for the following components:   Glucose-Capillary 171 (*)    All other components within normal limits   CBG MONITORING, ED - Abnormal; Notable for the following components:   Glucose-Capillary 185 (*)    All other components within normal limits  CBG MONITORING, ED - Abnormal; Notable for the following components:   Glucose-Capillary 303 (*)    All other components within normal limits  RESPIRATORY PANEL BY RT PCR (FLU A&B, COVID)  I-STAT BETA HCG BLOOD, ED ([facility], [facility], [facility] ONLY)  CBG MONITORING, ED    EKG EKG Interpretation  Date/Time:  Wednesday June 03 2020 09:28:43 EDT Ventricular Rate:  105 PR Interval:    QRS Duration: 79 QT Interval:  360 QTC Calculation: 476 R Axis:   23 Text Interpretation: Sinus tachycardia Probable left atrial enlargement Borderline repolarization abnormality Borderline prolonged QT interval Confirmed by Sherwood Gambler [phone]) on 06/03/2020 9:37:06 AM   Radiology CT Head Wo Contrast  Result Date: 06/03/2020 CLINICAL DATA:  Ataxia.  Fatigue. EXAM: CT HEAD WITHOUT CONTRAST TECHNIQUE: Contiguous axial images were obtained from the base of the skull through the vertex without intravenous contrast. COMPARISON:  None. FINDINGS: Brain: No evidence of acute large vascular territory infarction, hemorrhage, hydrocephalus, extra-axial collection or mass lesion/mass effect. Vascular: No hyperdense vessel identified. Skull: No acute fracture. Sinuses/Orbits: Minimal mucosal thickening involving the partially imaged inferior right maxillary sinus. Otherwise, sinuses are clear. No acute orbital abnormality. Other: No mastoid effusions. IMPRESSION: No evidence of acute intracranial abnormality. Electronically Signed   By: Albertina Parr  Adah Salvage MD   On: 06/03/2020 10:23    Procedures .Critical Care Performed by: Franchot Heidelberg, PA-C Authorized by: Franchot Heidelberg, PA-C   Critical care provider statement:    Critical care time (minutes):  50   Critical care was necessary to treat or prevent imminent or life-threatening deterioration of the following conditions:   Endocrine crisis   Critical care was time spent personally by me on the following activities:  Blood draw for specimens, development of treatment plan with patient or surrogate, evaluation of patient's response to treatment, examination of patient, obtaining history from patient or surrogate, ordering and review of laboratory studies, ordering and performing treatments and interventions, ordering and review of radiographic studies, pulse oximetry, re-evaluation of patient's condition and review of old charts   I assumed direction of critical care for this patient from another provider in my specialty: no   Comments:     Pt with hypoglycemia of 19, given d50 and PO. Frequent rechecks   (including critical care time)  Medications Ordered in ED Medications  sodium chloride 0.9 % bolus 500 mL (0 mLs Intravenous Stopped 06/03/20 1419)  dextrose 50 % solution 50 mL (50 mLs Intravenous Given 06/03/20 1043)    ED Course  I have reviewed the triage vital signs and the nursing notes.  Pertinent labs & imaging results that were available during my care of the patient were reviewed by me and considered in my medical decision making (see chart for details).    MDM Rules/Calculators/A&P                          Patient presenting for evaluation of generalized fatigue.  On exam, patient appears nontoxic.  Was been going on for several days.  She has recently been told that her B12 level is low, this may be contributing to her fatigue.  Will check electrolytes to rule out significant anemia, dehydration, electrolyte abnormality.  Will obtain urine to look for infection.  CT head ordered due to patient's recent fall with possible loss of consciousness continued head pain.  EKG nonischemic.  CBC overall reassuring.  Hemoglobin at baseline.  Mild nonspecific leukocytosis of 11.7.  CMP related hemolysis.  Case discussed with attending, Dr. Also evaluated the patient.  Patient was extremely altered on Dr. Richardson Landry  evaluation,\CBG was rechecked and was 19.  Patient given orange juice and D50. Sandwich given.   CBG improved to 171.  CMP resulted, overall at patient's baseline.  Creatinine elevated at 2.7, though similar to patient's baseline of 2.6.  We will continue to monitor blood sugar and reassess.  Repeat blood sugar overall reassuring, no further hypoglycemia.  On reassessment, patient reports she is feeling well.  Discussed possible cause of fatigue including B12 deficiency.  Discussed my concern about frequent episodes of hypoglycemia, and close follow-up with PCP. At this time, pt appears safe for d/c. Return precautions given. Pt states she understands and agrees to plan.   Final Clinical Impression(s) / ED Diagnoses Final diagnoses:  Hypoglycemia  Fatigue, unspecified type    Rx / DC Orders ED Discharge Orders    None       Franchot Heidelberg, PA-C 06/03/20 1425    Sherwood Gambler, MD 06/03/20 1530

## 2020-06-03 NOTE — ED Notes (Signed)
EDP reports Pt is not making sense and keeps repeating "low blood sugar."

## 2020-06-03 NOTE — ED Provider Notes (Signed)
Medical screening examination/treatment/procedure(s) were conducted as a shared visit with non-physician practitioner(s) and myself.  I personally evaluated the patient during the encounter.  EKG Interpretation  Date/Time:  Wednesday June 03 2020 09:28:43 EDT Ventricular Rate:  105 PR Interval:    QRS Duration: 79 QT Interval:  360 QTC Calculation: 476 R Axis:   23 Text Interpretation: Sinus tachycardia Probable left atrial enlargement Borderline repolarization abnormality Borderline prolonged QT interval Confirmed by Sherwood Gambler [phone]) on 06/03/2020 9:37:06 AM   Patient here for generalized weakness and a recent fall. Head CT unremarkable. When I went to evaluate her, she was talking repetitively and not making sense. Glucose found to be 19. Given juice and D50 and now she is acting normally. Creatinine is stable from baseline. She has kept her glucose up and seems stable for d/c.  CRITICAL CARE Performed by: Ephraim Hamburger   Total critical care time: 30 minutes  Critical care time was exclusive of separately billable procedures and treating other patients.  Critical care was necessary to treat or prevent imminent or life-threatening deterioration.  Critical care was time spent personally by me on the following activities: development of treatment plan with patient and/or surrogate as well as nursing, discussions with consultants, evaluation of patient's response to treatment, examination of patient, obtaining history from patient or surrogate, ordering and performing treatments and interventions, ordering and review of laboratory studies, ordering and review of radiographic studies, pulse oximetry and re-evaluation of patient's condition.    Sherwood Gambler, MD 06/03/20 1530

## 2020-06-03 NOTE — ED Notes (Signed)
Pt given orange juice x 2.

## 2020-06-03 NOTE — ED Notes (Signed)
Pt given a [place] sandwich and orange juice.

## 2020-06-03 NOTE — Discharge Instructions (Signed)
Take half your insulin today, but make sure you are still eating regular meals. Follow-up with your primary care doctor for further management of your diabetes and B12 deficiency. Continue taking all other medications as prescribed. Return to the emergency room if you develop severe worsening head pain, chest pain, difficulty breathing, or any new, worsening, or concerning symptoms.

## 2020-06-03 NOTE — ED Triage Notes (Signed)
Pt arrived via walk in, c/o headache and unsteady balance since fall sat. States her CBG was low on sat, she fell, hitting her head on dog kennel, headache and pressure to left temple, blurred vision and feeling very "unsteady' since. Also c/o increasing fatigue and SOB after taking a few steps. Aox4 in triage.   CBG  106

## 2020-06-03 NOTE — ED Notes (Signed)
Dark green tube sent to lab to replace light green.

## 2020-06-03 NOTE — ED Notes (Signed)
Pt reports she fell and hit her head x 4 days ago.  Sts she has been dizzy and unsteady since.  Pt attributes fall to hypoglycemia.  Hx DM I.  Pt c/o intermittent L chest tightness x 4 days.

## 2020-06-04 LAB — CBG MONITORING, ED: Glucose-Capillary: 19 mg/dL — CL (ref 70–99)

## 2020-08-06 ENCOUNTER — Other Ambulatory Visit (HOSPITAL_COMMUNITY): Payer: Self-pay

## 2020-08-07 ENCOUNTER — Ambulatory Visit (HOSPITAL_COMMUNITY)
Admission: RE | Admit: 2020-08-07 | Discharge: 2020-08-07 | Disposition: A | Discharge status: Home / Self Care | Payer: BC Managed Care – PPO | Source: Ambulatory Visit | Attending: Internal Medicine | Admitting: Internal Medicine

## 2020-08-07 ENCOUNTER — Other Ambulatory Visit: Payer: Self-pay

## 2020-08-07 ENCOUNTER — Encounter: Payer: Self-pay | Admitting: Internal Medicine

## 2020-08-07 DIAGNOSIS — N189 Chronic kidney disease, unspecified: Secondary | ICD-10-CM | POA: Insufficient documentation

## 2020-08-07 DIAGNOSIS — D631 Anemia in chronic kidney disease: Secondary | ICD-10-CM | POA: Diagnosis not present

## 2020-08-07 DIAGNOSIS — N184 Chronic kidney disease, stage 4 (severe): Secondary | ICD-10-CM | POA: Insufficient documentation

## 2020-08-07 DIAGNOSIS — N183 Chronic kidney disease, stage 3 unspecified: Secondary | ICD-10-CM | POA: Insufficient documentation

## 2020-08-07 LAB — POCT HEMOGLOBIN-HEMACUE: Hemoglobin: 10.2 g/dL — ABNORMAL LOW (ref 12.0–15.0)

## 2020-08-07 MED ORDER — EPOETIN ALFA-EPBX 40000 UNIT/ML IJ SOLN
INTRAMUSCULAR | Status: AC
  Filled 2020-08-07: qty 1

## 2020-08-07 MED ORDER — EPOETIN ALFA-EPBX 40000 UNIT/ML IJ SOLN
INTRAMUSCULAR | Status: AC
  Administered 2020-08-07: 30000 [IU] via SUBCUTANEOUS
  Filled 2020-08-07: qty 1

## 2020-08-07 MED ORDER — EPOETIN ALFA-EPBX 40000 UNIT/ML IJ SOLN: 30000.0000 [IU] | INTRAMUSCULAR | Status: DC

## 2020-08-07 MED ORDER — SODIUM CHLORIDE 0.9 % IV SOLN
510.0000 mg | Freq: Once | INTRAVENOUS | Status: AC
  Administered 2020-08-07: 510 mg via INTRAVENOUS
  Filled 2020-08-07: qty 17

## 2020-08-07 NOTE — Discharge Instructions (Signed)
  Epoetin Alfa injection What is this medicine? EPOETIN ALFA (e POE e tin AL fa) helps your body make more red blood cells. This medicine is used to treat anemia caused by chronic kidney disease, cancer chemotherapy, or HIV-therapy. It may also be used before surgery if you have anemia. This medicine may be used for other purposes; ask your health care provider or pharmacist if you have questions. COMMON BRAND NAME(S): Epogen, Procrit, Retacrit What should I tell my health care provider before I take this medicine? They need to know if you have any of these conditions:  cancer  heart disease  high blood pressure  history of blood clots  history of stroke  low levels of folate, iron, or vitamin B12 in the blood  seizures  an unusual or allergic reaction to erythropoietin, albumin, benzyl alcohol, hamster proteins, other medicines, foods, dyes, or preservatives  pregnant or trying to get pregnant  breast-feeding How should I use this medicine? This medicine is for injection into a vein or under the skin. It is usually given by a health care professional in a hospital or clinic setting. If you get this medicine at home, you will be taught how to prepare and give this medicine. Use exactly as directed. Take your medicine at regular intervals. Do not take your medicine more often than directed. It is important that you put your used needles and syringes in a special sharps container. Do not put them in a trash can. If you do not have a sharps container, call your pharmacist or healthcare provider to get one. A special MedGuide will be given to you by the pharmacist with each prescription and refill. Be sure to read this information carefully each time. Talk to your pediatrician regarding the use of this medicine in children. While this drug may be prescribed for selected conditions, precautions do apply. Overdosage: If you think you have taken too much of this medicine contact a  poison control center or emergency room at once. NOTE: This medicine is only for you. Do not share this medicine with others. What if I miss a dose? If you miss a dose, take it as soon as you can. If it is almost time for your next dose, take only that dose. Do not take double or extra doses. What may interact with this medicine? Interactions have not been studied. This list may not describe all possible interactions. Give your health care provider a list of all the medicines, herbs, non-prescription drugs, or dietary supplements you use. Also tell them if you smoke, drink alcohol, or use illegal drugs. Some items may interact with your medicine. What should I watch for while using this medicine? Your condition will be monitored carefully while you are receiving this medicine. You may need blood work done while you are taking this medicine. This medicine may cause a decrease in vitamin B6. You should make sure that you get enough vitamin B6 while you are taking this medicine. Discuss the foods you eat and the vitamins you take with your health care professional. What side effects may I notice from receiving this medicine? Side effects that you should report to your doctor or health care professional as soon as possible:  allergic reactions like skin rash, itching or hives, swelling of the face, lips, or tongue  seizures  signs and symptoms of a blood clot such as breathing problems; changes in vision; chest pain; severe, sudden headache; pain, swelling, warmth in the leg; trouble speaking; sudden   numbness or weakness of the face, arm or leg  signs and symptoms of a stroke like changes in vision; confusion; trouble speaking or understanding; severe headaches; sudden numbness or weakness of the face, arm or leg; trouble walking; dizziness; loss of balance or coordination Side effects that usually do not require medical attention (report to your doctor or health care professional if they continue  or are bothersome):  chills  cough  dizziness  fever  headaches  joint pain  muscle cramps  muscle pain  nausea, vomiting  pain, redness, or irritation at site where injected This list may not describe all possible side effects. Call your doctor for medical advice about side effects. You may report side effects to FDA at 1-800-FDA-1088. Where should I keep my medicine? Keep out of the reach of children. Store in a refrigerator between 2 and 8 degrees C (36 and 46 degrees F). Do not freeze or shake. Throw away any unused portion if using a single-dose vial. Multi-dose vials can be kept in the refrigerator for up to 21 days after the initial dose. Throw away unused medicine. NOTE: This sheet is a summary. It may not cover all possible information. If you have questions about this medicine, talk to your doctor, pharmacist, or health care provider.  2020 Elsevier/Gold Standard (2017-03-24 08:35:19) Ferumoxytol injection What is this medicine? FERUMOXYTOL is an iron complex. Iron is used to make healthy red blood cells, which carry oxygen and nutrients throughout the body. This medicine is used to treat iron deficiency anemia. This medicine may be used for other purposes; ask your health care provider or pharmacist if you have questions. COMMON BRAND NAME(S): Feraheme What should I tell my health care provider before I take this medicine? They need to know if you have any of these conditions:  anemia not caused by low iron levels  high levels of iron in the blood  magnetic resonance imaging (MRI) test scheduled  an unusual or allergic reaction to iron, other medicines, foods, dyes, or preservatives  pregnant or trying to get pregnant  breast-feeding How should I use this medicine? This medicine is for injection into a vein. It is given by a health care professional in a hospital or clinic setting. Talk to your pediatrician regarding the use of this medicine in children.  Special care may be needed. Overdosage: If you think you have taken too much of this medicine contact a poison control center or emergency room at once. NOTE: This medicine is only for you. Do not share this medicine with others. What if I miss a dose? It is important not to miss your dose. Call your doctor or health care professional if you are unable to keep an appointment. What may interact with this medicine? This medicine may interact with the following medications:  other iron products This list may not describe all possible interactions. Give your health care provider a list of all the medicines, herbs, non-prescription drugs, or dietary supplements you use. Also tell them if you smoke, drink alcohol, or use illegal drugs. Some items may interact with your medicine. What should I watch for while using this medicine? Visit your doctor or healthcare professional regularly. Tell your doctor or healthcare professional if your symptoms do not start to get better or if they get worse. You may need blood work done while you are taking this medicine. You may need to follow a special diet. Talk to your doctor. Foods that contain iron include: whole grains/cereals, dried fruits,   beans, or peas, leafy green vegetables, and organ meats (liver, kidney). What side effects may I notice from receiving this medicine? Side effects that you should report to your doctor or health care professional as soon as possible:  allergic reactions like skin rash, itching or hives, swelling of the face, lips, or tongue  breathing problems  changes in blood pressure  feeling faint or lightheaded, falls  fever or chills  flushing, sweating, or hot feelings  swelling of the ankles or feet Side effects that usually do not require medical attention (report to your doctor or health care professional if they continue or are bothersome):  diarrhea  headache  nausea, vomiting  stomach pain This list may not  describe all possible side effects. Call your doctor for medical advice about side effects. You may report side effects to FDA at 1-800-FDA-1088. Where should I keep my medicine? This drug is given in a hospital or clinic and will not be stored at home. NOTE: This sheet is a summary. It may not cover all possible information. If you have questions about this medicine, talk to your doctor, pharmacist, or health care provider.  2020 Elsevier/Gold Standard (2016-10-03 20:21:10)  

## 2020-09-04 ENCOUNTER — Encounter (HOSPITAL_COMMUNITY): Payer: BC Managed Care – PPO

## 2020-09-29 ENCOUNTER — Other Ambulatory Visit: Payer: Self-pay

## 2020-09-29 ENCOUNTER — Encounter (HOSPITAL_COMMUNITY): Payer: Self-pay

## 2020-09-29 ENCOUNTER — Observation Stay (HOSPITAL_COMMUNITY)
Admission: EM | Admit: 2020-09-29 | Discharge: 2020-09-30 | Disposition: A | Discharge status: Home / Self Care | Payer: No Typology Code available for payment source | Attending: Internal Medicine | Admitting: Internal Medicine

## 2020-09-29 DIAGNOSIS — E1021 Type 1 diabetes mellitus with diabetic nephropathy: Secondary | ICD-10-CM | POA: Diagnosis not present

## 2020-09-29 DIAGNOSIS — E1010 Type 1 diabetes mellitus with ketoacidosis without coma: Secondary | ICD-10-CM | POA: Diagnosis not present

## 2020-09-29 DIAGNOSIS — I129 Hypertensive chronic kidney disease with stage 1 through stage 4 chronic kidney disease, or unspecified chronic kidney disease: Secondary | ICD-10-CM | POA: Diagnosis not present

## 2020-09-29 DIAGNOSIS — Z23 Encounter for immunization: Secondary | ICD-10-CM | POA: Diagnosis not present

## 2020-09-29 DIAGNOSIS — N184 Chronic kidney disease, stage 4 (severe): Secondary | ICD-10-CM | POA: Insufficient documentation

## 2020-09-29 DIAGNOSIS — E1065 Type 1 diabetes mellitus with hyperglycemia: Secondary | ICD-10-CM | POA: Diagnosis present

## 2020-09-29 DIAGNOSIS — I10 Essential (primary) hypertension: Secondary | ICD-10-CM

## 2020-09-29 DIAGNOSIS — Z794 Long term (current) use of insulin: Secondary | ICD-10-CM | POA: Insufficient documentation

## 2020-09-29 DIAGNOSIS — N179 Acute kidney failure, unspecified: Secondary | ICD-10-CM | POA: Diagnosis present

## 2020-09-29 DIAGNOSIS — E1159 Type 2 diabetes mellitus with other circulatory complications: Secondary | ICD-10-CM | POA: Diagnosis present

## 2020-09-29 DIAGNOSIS — Z79899 Other long term (current) drug therapy: Secondary | ICD-10-CM | POA: Diagnosis not present

## 2020-09-29 DIAGNOSIS — I152 Hypertension secondary to endocrine disorders: Secondary | ICD-10-CM | POA: Diagnosis present

## 2020-09-29 DIAGNOSIS — Z20822 Contact with and (suspected) exposure to covid-19: Secondary | ICD-10-CM | POA: Diagnosis not present

## 2020-09-29 LAB — URINALYSIS, ROUTINE W REFLEX MICROSCOPIC
Bilirubin Urine: NEGATIVE
Glucose, UA: >=500 mg/dL — AB
Ketones, ur: 80 mg/dL — AB
Leukocytes,Ua: NEGATIVE
Nitrite: NEGATIVE
Protein, ur: >=300 mg/dL — AB
Specific Gravity, Urine: 1.012 (ref 1.005–1.030)
pH: 5 (ref 5.0–8.0)

## 2020-09-29 LAB — CBC
HCT: 31.2 % — ABNORMAL LOW (ref 36.0–46.0)
Hemoglobin: 10.2 g/dL — ABNORMAL LOW (ref 12.0–15.0)
MCH: 25.8 pg — ABNORMAL LOW (ref 26.0–34.0)
MCHC: 32.7 g/dL (ref 30.0–36.0)
MCV: 79 fL — ABNORMAL LOW (ref 80.0–100.0)
Platelets: 304 10*3/uL (ref 150–400)
RBC: 3.95 MIL/uL (ref 3.87–5.11)
RDW: 14.1 % (ref 11.5–15.5)
WBC: 11 10*3/uL — ABNORMAL HIGH (ref 4.0–10.5)
nRBC: 0 % (ref 0.0–0.2)

## 2020-09-29 LAB — BASIC METABOLIC PANEL
Anion gap: 24 — ABNORMAL HIGH (ref 5–15)
Anion gap: 13 (ref 5–15)
BUN: 55 mg/dL — ABNORMAL HIGH (ref 6–20)
BUN: 50 mg/dL — ABNORMAL HIGH (ref 6–20)
CO2: 7 mmol/L — ABNORMAL LOW (ref 22–32)
CO2: 16 mmol/L — ABNORMAL LOW (ref 22–32)
Calcium: 8.7 mg/dL — ABNORMAL LOW (ref 8.9–10.3)
Calcium: 8.9 mg/dL (ref 8.9–10.3)
Chloride: 104 mmol/L (ref 98–111)
Chloride: 113 mmol/L — ABNORMAL HIGH (ref 98–111)
Creatinine, Ser: 4.74 mg/dL — ABNORMAL HIGH (ref 0.44–1.00)
Creatinine, Ser: 4.29 mg/dL — ABNORMAL HIGH (ref 0.44–1.00)
GFR, Estimated: 12 mL/min — ABNORMAL LOW (ref >60)
GFR, Estimated: 13 mL/min — ABNORMAL LOW (ref >60)
Glucose, Bld: 741 mg/dL (ref 70–99)
Glucose, Bld: 146 mg/dL — ABNORMAL HIGH (ref 70–99)
Potassium: 5.1 mmol/L (ref 3.5–5.1)
Potassium: 3.8 mmol/L (ref 3.5–5.1)
Sodium: 135 mmol/L (ref 135–145)
Sodium: 142 mmol/L (ref 135–145)

## 2020-09-29 LAB — CBG MONITORING, ED
Glucose-Capillary: 579 mg/dL (ref 70–99)
Glucose-Capillary: 514 mg/dL (ref 70–99)
Glucose-Capillary: 359 mg/dL — ABNORMAL HIGH (ref 70–99)
Glucose-Capillary: 442 mg/dL — ABNORMAL HIGH (ref 70–99)
Glucose-Capillary: 245 mg/dL — ABNORMAL HIGH (ref 70–99)
Glucose-Capillary: 79 mg/dL (ref 70–99)
Glucose-Capillary: 120 mg/dL — ABNORMAL HIGH (ref 70–99)

## 2020-09-29 LAB — BLOOD GAS, VENOUS
Acid-base deficit: 19.5 mmol/L — ABNORMAL HIGH (ref 0.0–2.0)
Bicarbonate: 8.1 mmol/L — ABNORMAL LOW (ref 20.0–28.0)
O2 Saturation: 70 %
Patient temperature: 98.6
pCO2, Ven: 25 mmHg — ABNORMAL LOW (ref 44.0–60.0)
pH, Ven: 7.139 — CL (ref 7.250–7.430)
pO2, Ven: 45.1 mmHg — ABNORMAL HIGH (ref 32.0–45.0)

## 2020-09-29 LAB — GLUCOSE, CAPILLARY: Glucose-Capillary: 141 mg/dL — ABNORMAL HIGH (ref 70–99)

## 2020-09-29 LAB — I-STAT BETA HCG BLOOD, ED (MC, WL, AP ONLY): I-stat hCG, quantitative: <5 m[IU]/mL (ref <5)

## 2020-09-29 LAB — BETA-HYDROXYBUTYRIC ACID: Beta-Hydroxybutyric Acid: 7.32 mmol/L — ABNORMAL HIGH (ref 0.05–0.27)

## 2020-09-29 MED ORDER — HEPARIN SODIUM (PORCINE) 5000 UNIT/ML IJ SOLN
5000.0000 [IU] | Freq: Three times a day (TID) | INTRAMUSCULAR | Status: DC
  Administered 2020-09-29 – 2020-09-30 (×3): 5000 [IU] via SUBCUTANEOUS
  Filled 2020-09-29 (×3): qty 1

## 2020-09-29 MED ORDER — SODIUM BICARBONATE 650 MG PO TABS
650.0000 mg | ORAL_TABLET | Freq: Two times a day (BID) | ORAL | Status: DC
  Administered 2020-09-29 – 2020-09-30 (×2): 650 mg via ORAL
  Filled 2020-09-29 (×2): qty 1

## 2020-09-29 MED ORDER — DEXTROSE IN LACTATED RINGERS 5 % IV SOLN: INTRAVENOUS | Status: DC

## 2020-09-29 MED ORDER — LACTATED RINGERS IV SOLN: INTRAVENOUS | Status: DC

## 2020-09-29 MED ORDER — ONDANSETRON HCL 4 MG/2ML IJ SOLN
4.0000 mg | Freq: Once | INTRAMUSCULAR | Status: AC
  Administered 2020-09-29: 4 mg via INTRAVENOUS
  Filled 2020-09-29: qty 2

## 2020-09-29 MED ORDER — METOPROLOL TARTRATE 5 MG/5ML IV SOLN
5.0000 mg | Freq: Once | INTRAVENOUS | Status: AC
  Administered 2020-09-29: 5 mg via INTRAVENOUS
  Filled 2020-09-29: qty 5

## 2020-09-29 MED ORDER — LACTATED RINGERS IV BOLUS
20.0000 mL/kg | Freq: Once | INTRAVENOUS | Status: AC
  Administered 2020-09-29: 1480 mL via INTRAVENOUS

## 2020-09-29 MED ORDER — SODIUM CHLORIDE 0.9 % IV BOLUS
1000.0000 mL | Freq: Once | INTRAVENOUS | Status: AC
  Administered 2020-09-29: 1000 mL via INTRAVENOUS

## 2020-09-29 MED ORDER — HYDRALAZINE HCL 25 MG PO TABS
25.0000 mg | ORAL_TABLET | Freq: Three times a day (TID) | ORAL | Status: DC
  Administered 2020-09-29 – 2020-09-30 (×3): 25 mg via ORAL
  Filled 2020-09-29 (×3): qty 1

## 2020-09-29 MED ORDER — INSULIN ASPART 100 UNIT/ML ~~LOC~~ SOLN
6.0000 [IU] | Freq: Once | SUBCUTANEOUS | Status: AC
  Administered 2020-09-29: 6 [IU] via INTRAVENOUS
  Filled 2020-09-29: qty 0.06

## 2020-09-29 MED ORDER — METOPROLOL TARTRATE 25 MG PO TABS
50.0000 mg | ORAL_TABLET | Freq: Two times a day (BID) | ORAL | Status: DC
  Administered 2020-09-29 – 2020-09-30 (×2): 50 mg via ORAL
  Filled 2020-09-29 (×2): qty 2

## 2020-09-29 MED ORDER — AMLODIPINE BESYLATE 5 MG PO TABS
5.0000 mg | ORAL_TABLET | Freq: Every day | ORAL | Status: DC
  Administered 2020-09-30: 5 mg via ORAL
  Filled 2020-09-29: qty 1

## 2020-09-29 MED ORDER — INFLUENZA VAC SPLIT QUAD 0.5 ML IM SUSY
0.5000 mL | PREFILLED_SYRINGE | INTRAMUSCULAR | Status: AC
  Administered 2020-09-30: 0.5 mL via INTRAMUSCULAR
  Filled 2020-09-29: qty 0.5

## 2020-09-29 MED ORDER — DEXTROSE 50 % IV SOLN: 0.0000 mL | INTRAVENOUS | Status: DC | PRN

## 2020-09-29 MED ORDER — INSULIN REGULAR(HUMAN) IN NACL 100-0.9 UT/100ML-% IV SOLN
INTRAVENOUS | Status: DC
  Administered 2020-09-29: 6.5 [IU]/h via INTRAVENOUS
  Filled 2020-09-29: qty 100

## 2020-09-29 NOTE — ED Provider Notes (Signed)
[facility] DEPT Provider Note   CSN: [id number] Arrival date & time: 09/29/20  1458     History Chief Complaint  Patient presents with  . Hyperglycemia    Kimberly [name] is a 30 y.o. female past medical history of sickle cell trait, type 1 diabetes who presents for evaluation of high blood sugars that have been ongoing for 4 days.  She states that she started noticing that her blood sugars were ranging in the high range at home.  She states she had several readings that were greater than 600.  She states normally her blood sugar ranges between 150-200.  She thought her insulin pump was occluded so she changed it but she was still having issues with high blood sugar.  She started doing manual injections but states that she could not get her blood sugars down.  She started having some diffuse nausea, vomiting, abdominal pain.  She reports that for the last 3 days, she has not been able to tolerate any p.o.  She has not noted any fevers, chest pain.  She states occasionally when she gets vomiting, she will start having some trouble breathing.  She states she has been compliant with her insulin and has not missed any doses.  She denies any nasal congestion, rhinorrhea.  The history is provided by the patient.       Past Medical History:  Diagnosis Date  . Diabetes mellitus without complication ([place])   . Preeclampsia   . Preeclampsia without severe features   . Sickle cell trait [facility])     Patient Active Problem List   Diagnosis Date Noted  . Diabetic ketoacidosis associated with type 1 diabetes mellitus ([place]) 09/29/2020  . CKD (chronic kidney disease) stage 4, GFR 15-29 ml/min (HCC) 08/07/2020  . Anemia in chronic kidney disease 08/07/2020  . Overweight (BMI 25.0-29.9) 11/30/2019  . Acute lower UTI 11/29/2019  . AKI (acute kidney injury) ([place]) 11/29/2019  . HTN (hypertension) 11/29/2019  . Hypokalemia 09/06/2018  . Hypertensive urgency 09/06/2018  .  Leukocytosis 09/06/2018  . GERD (gastroesophageal reflux disease) 09/06/2018  . Gastroparesis   . Intractable nausea and vomiting 08/29/2018  . Microcytic anemia 08/29/2018  . CIN III (cervical intraepithelial neoplasia grade III) with severe dysplasia 09/09/2017  . DKA, type 1 ([place]) 03/17/2015  . Diabetic nephropathy associated with type 1 diabetes mellitus ([place]) 03/17/2015  . Type 1 diabetes mellitus with diabetic nephropathy ([place]) 05/25/2013    Past Surgical History:  Procedure Laterality Date  . ESOPHAGOGASTRODUODENOSCOPY  08/2018   [facility] [place] and a gastric enptying study   . WISDOM TOOTH EXTRACTION       OB History    Gravida  1   Para  1   Term      Preterm  1   AB      Living  1     SAB      IAB      Ectopic      Multiple      Live Births  1           Family History  Problem Relation Age of Onset  . Lupus Mother   . Aneurysm Mother   . Colon polyps Maternal Aunt        great aunt  . Colon cancer Neg Hx   . Esophageal cancer Neg Hx     Social History   Tobacco Use  . Smoking status: Never Smoker  . Smokeless tobacco: Never  Used  Vaping Use  . Vaping Use: Never used  Substance Use Topics  . Alcohol use: No  . Drug use: No    Home Medications Prior to Admission medications   Medication Sig Start Date End Date Taking? Authorizing Provider  BAYER CONTOUR NEXT TEST test strip USE AS DIRECTED TO TEST 6 TO 8 TIMES DAILY Patient taking differently: 1 each by Other route.  10/01/14   Woodroe Mode, MD  Blood Glucose Monitoring Suppl (CONTOUR NEXT EZ MONITOR) W/DEVICE KIT 1 each by Does not apply route 6 (six) times daily. Provide lancets for testing six times daily. Type I DM with diabetes on Insulin Pump  DX 648.03 06/17/13   Woodroe Mode, MD  etonogestrel (NEXPLANON) 68 MG IMPL implant Inject 1 each into the skin once.    [provider]  Insulin Human (INSULIN PUMP) SOLN Inject into the skin. Humalog    [provider]  insulin lispro (HUMALOG) 100 UNIT/ML injection Inject 3-6 Units into the skin 3 (three) times daily before meals. Via insulin pump; INJECT 100 UNITS MAX DAILY DOSEAGE VIA INSULIN PUMP UNDER THE SKIN    [provider]  metoprolol tartrate (LOPRESSOR) 50 MG tablet Take 1 tablet (50 mg total) by mouth 2 (two) times daily. 03/23/18   Doreatha Lew, MD    Allergies    Reglan [metoclopramide]  Review of Systems   Review of Systems  Constitutional: Negative for fever.  Respiratory: Negative for cough and shortness of breath.   Cardiovascular: Negative for chest pain.  Gastrointestinal: Positive for abdominal pain, nausea and vomiting.  Genitourinary: Negative for dysuria and hematuria.  Neurological: Negative for headaches.  All other systems reviewed and are negative.   Physical Exam Updated Vital Signs BP (!) 189/108   Pulse 87   Temp 99.1 F (37.3 C) (Oral)   Resp 18   Ht 5' 5"  (1.651 m)   Wt 74 kg   LMP 08/28/2020   SpO2 100%   BMI 27.15 kg/m   Physical Exam Vitals and nursing note reviewed.  Constitutional:      Appearance: Normal appearance. She is well-developed and well-nourished.  HENT:     Head: Normocephalic and atraumatic.     Mouth/Throat:     Mouth: Oropharynx is clear and moist and mucous membranes are normal.  Eyes:     General: Lids are normal.     Extraocular Movements: EOM normal.     Conjunctiva/sclera: Conjunctivae normal.     Pupils: Pupils are equal, round, and reactive to light.  Cardiovascular:     Rate and Rhythm: Normal rate and regular rhythm.     Pulses: Normal pulses.     Heart sounds: Normal heart sounds. No murmur heard. No friction rub. No gallop.   Pulmonary:     Effort: Pulmonary effort is normal.     Breath sounds: Normal breath sounds.     Comments: Lungs clear to auscultation bilaterally.  Symmetric chest rise.  No wheezing, rales, rhonchi. Abdominal:     Palpations: Abdomen is soft. Abdomen is not  rigid.     Tenderness: There is no abdominal tenderness. There is no guarding.     Comments: Abdomen is soft, non-distended, non-tender. No rigidity, No guarding. No peritoneal signs.  Musculoskeletal:        General: Normal range of motion.     Cervical back: Full passive range of motion without pain.  Skin:    General: Skin is warm and dry.  Capillary Refill: Capillary refill takes less than 2 seconds.  Neurological:     Mental Status: She is alert and oriented to person, place, and time.  Psychiatric:        Mood and Affect: Mood and affect normal.        Speech: Speech normal.     ED Results / Procedures / Treatments   Labs (all labs ordered are listed, but only abnormal results are displayed) Labs Reviewed  BASIC METABOLIC PANEL - Abnormal; Notable for the following components:      Result Value   CO2 7 (*)    Glucose, Bld 741 (*)    BUN 55 (*)    Creatinine, Ser 4.74 (*)    Calcium 8.7 (*)    GFR, Estimated 12 (*)    Anion gap 24 (*)    All other components within normal limits  CBC - Abnormal; Notable for the following components:   WBC 11.0 (*)    Hemoglobin 10.2 (*)    HCT 31.2 (*)    MCV 79.0 (*)    MCH 25.8 (*)    All other components within normal limits  URINALYSIS, ROUTINE W REFLEX MICROSCOPIC - Abnormal; Notable for the following components:   APPearance CLOUDY (*)    Glucose, UA >=500 (*)    Hgb urine dipstick SMALL (*)    Ketones, ur 80 (*)    Protein, ur >=300 (*)    Bacteria, UA RARE (*)    All other components within normal limits  BLOOD GAS, VENOUS - Abnormal; Notable for the following components:   pH, Ven 7.139 (*)    pCO2, Ven 25.0 (*)    pO2, Ven 45.1 (*)    Bicarbonate 8.1 (*)    Acid-base deficit 19.5 (*)    All other components within normal limits  CBG MONITORING, ED - Abnormal; Notable for the following components:   Glucose-Capillary 579 (*)    All other components within normal limits  CBG MONITORING, ED - Abnormal; Notable  for the following components:   Glucose-Capillary 514 (*)    All other components within normal limits  SARS CORONAVIRUS 2 (TAT 6-24 HRS)  BETA-HYDROXYBUTYRIC ACID  BETA-HYDROXYBUTYRIC ACID  I-STAT BETA HCG BLOOD, ED ([facility], [facility], [facility] ONLY)    EKG None  Radiology No results found.  Procedures .Critical Care Performed by: Volanda Napoleon, PA-C Authorized by: Volanda Napoleon, PA-C   Critical care provider statement:    Critical care time (minutes):  35   Critical care was necessary to treat or prevent imminent or life-threatening deterioration of the following conditions:  Metabolic crisis   Critical care was time spent personally by me on the following activities:  Discussions with consultants, evaluation of patient's response to treatment, examination of patient, ordering and performing treatments and interventions, ordering and review of laboratory studies, ordering and review of radiographic studies, pulse oximetry, re-evaluation of patient's condition, obtaining history from patient or surrogate and review of old charts     Medications Ordered in ED Medications  insulin regular, human (MYXREDLIN) 100 units/ 100 mL infusion (6.5 Units/hr Intravenous New Bag/Given 09/29/20 1813)  lactated ringers infusion (has no administration in time range)  dextrose 5 % in lactated ringers infusion (has no administration in time range)  dextrose 50 % solution 0-50 mL (has no administration in time range)  sodium chloride 0.9 % bolus 1,000 mL (1,000 mLs Intravenous New Bag/Given 09/29/20 1644)  ondansetron (ZOFRAN) injection 4 mg (4 mg Intravenous Given  09/29/20 1642)  insulin aspart (novoLOG) injection 6 Units (6 Units Intravenous Given 09/29/20 1643)  metoprolol tartrate (LOPRESSOR) injection 5 mg (5 mg Intravenous Given 09/29/20 1717)  lactated ringers bolus 1,480 mL (1,480 mLs Intravenous New Bag/Given 09/29/20 1814)    ED Course  I have reviewed the triage vital signs and the nursing  notes.  Pertinent labs & imaging results that were available during my care of the patient were reviewed by me and considered in my medical decision making (see chart for details).    MDM Rules/Calculators/A&P                          30 year old female who has past medical history of type 1 diabetes who presents for evaluation of hyperglycemia x4 days as well as nausea/vomiting. Her blood sugars have been >600s. She has been compliant with her insulin. On initial ED arrival she is afebrile, slightly tachycardic. She is hypertensive and states that she hasn't taken her meds in the last few days. Concern for DKA. Plan for labs.   CBG is 579.  CBC shows leukocytosis is 11.0. Hgb is 10.2. VBG shows a ph of 7.139. Bicarb is 8. UA shows ketones. I-stat beta is 5.0.  BMP shows blood glucose of 741.  Her bicarb is 7, anion gap of 24.  She also has AN AKI.  Her baseline creatinine is between 2.6-2.9.  Today it is 4.74. Likely from poor PO intake. We will start patient on insulin drip for DKA.  Will require admission.  Discussed patient with Dr. Posey Pronto (hospitalist) who accepts patient for admission.   Portions of this note were generated with [employer]. Dictation errors may occur despite best attempts at proofreading.  Final Clinical Impression(s) / ED Diagnoses Final diagnoses:  Diabetic ketoacidosis without coma associated with type 1 diabetes mellitus ([place])  AKI (acute kidney injury) ([place])  Hypertension, unspecified type    Rx / DC Orders ED Discharge Orders    None       Desma Mcgregor 09/29/20 Velta Addison    Gareth Morgan, MD 09/30/20 1228

## 2020-09-29 NOTE — H&P (Signed)
History and Physical    Kimberly [name] QIO:[id number] DOB: [birth year] DOA: 09/29/2020  PCP: Jacelyn Pi, MD  Patient coming from: Home  I have personally briefly reviewed patient's old medical records in [place]  Chief Complaint: Nausea, vomiting, hyperglycemia  HPI: Kimberly [name] is a 30 y.o. female with medical history significant for type 1 diabetes on insulin pump, CKD stage IV, hypertension who presents to the ED for evaluation of nausea, vomiting, and hyperglycemia.  Patient states 2 days ago she noticed her blood sugars were elevated. Home glucometer read greater than 600. She initially thought it was an issue with her pump and tried manual injections however blood sugars remained elevated. She has had poor oral intake and able to take her home medications. Today she developed nausea and vomiting. She called her endocrinologist who advised she come to the ED for evaluation as they both felt that she was developing DKA. Patient otherwise denies any subjective fevers, chills, diaphoresis, chest pain, dyspnea, or dysuria.  ED Course:  Initial vitals showed BP 170/92, pulse 100, RR 18, temp 99.1 F, SPO2 100% on room air.  Labs significant for serum glucose 741, bicarb 7, BUN 55, creatinine 4.74 (baseline 2.6-2.7), anion gap 24, sodium 135 (150 when corrected for hyperglycemia), potassium 5.1, WBC 11.0, hemoglobin 10.2, platelets 304,000, i-STAT beta-hCG <5.0.  Urinalysis shows >500 glucose, 80 ketones, >300 protein, negative nitrites, negative leukocytes, rare bacteria on microscopy.  SARS-CoV-2 PCR is collected and pending.  Patient was given 1 L normal saline, 1.48 L LR, IV Zofran, 6 units IV NovoLog then placed on insulin infusion, and 5 mg IV metoprolol.  The hospitalist service was consulted to admit for further evaluation and management.  Review of Systems: All systems reviewed and are negative except as documented in history of present illness above.   Past  Medical History:  Diagnosis Date  . Diabetes mellitus without complication ([place])   . Preeclampsia   . Preeclampsia without severe features   . Sickle cell trait [facility])     Past Surgical History:  Procedure Laterality Date  . ESOPHAGOGASTRODUODENOSCOPY  08/2018   [facility] [place] and a gastric enptying study   . WISDOM TOOTH EXTRACTION      Social History:  reports that she has never smoked. She has never used smokeless tobacco. She reports that she does not drink alcohol and does not use drugs.  Allergies  Allergen Reactions  . Reglan [Metoclopramide] Other (See Comments)    tardive dyskinesia per outside provider note    Family History  Problem Relation Age of Onset  . Lupus Mother   . Aneurysm Mother   . Colon polyps Maternal Aunt        great aunt  . Colon cancer Neg Hx   . Esophageal cancer Neg Hx      Prior to Admission medications   Medication Sig Start Date End Date Taking? Authorizing Provider  BAYER CONTOUR NEXT TEST test strip USE AS DIRECTED TO TEST 6 TO 8 TIMES DAILY Patient taking differently: 1 each by Other route.  10/01/14   Woodroe Mode, MD  Blood Glucose Monitoring Suppl (CONTOUR NEXT EZ MONITOR) W/DEVICE KIT 1 each by Does not apply route 6 (six) times daily. Provide lancets for testing six times daily. Type I DM with diabetes on Insulin Pump  DX 648.03 06/17/13   Woodroe Mode, MD  etonogestrel (NEXPLANON) 68 MG IMPL implant Inject 1 each into the skin once.    [provider]  Insulin Human (INSULIN PUMP) SOLN Inject into the skin. Humalog    [provider]  insulin lispro (HUMALOG) 100 UNIT/ML injection Inject 3-6 Units into the skin 3 (three) times daily before meals. Via insulin pump; INJECT 100 UNITS MAX DAILY DOSEAGE VIA INSULIN PUMP UNDER THE SKIN    [provider]  metoprolol tartrate (LOPRESSOR) 50 MG tablet Take 1 tablet (50 mg total) by mouth 2 (two) times daily. 03/23/18   Doreatha Lew, MD     Physical Exam: Vitals:   09/29/20 1730 09/29/20 1745 09/29/20 1800 09/29/20 1801  BP: (!) 177/108  (!) 189/108   Pulse: 93 91 87   Resp: 18  18   Temp:      TempSrc:      SpO2: 100% 100% 100%   Weight:    74 kg  Height:    5' 5"  (1.651 m)   Constitutional: Resting in bed, NAD, calm, comfortable Eyes: PERRL, lids and conjunctivae normal ENMT: Mucous membranes are dry. Posterior pharynx clear of any exudate or lesions.Normal dentition.  Neck: normal, supple, no masses. Respiratory: clear to auscultation bilaterally, no wheezing, no crackles. Normal respiratory effort. No accessory muscle use.  Cardiovascular: Regular rate and rhythm, no murmurs / rubs / gallops. No extremity edema. 2+ pedal pulses. Abdomen: no tenderness, no masses palpated. No hepatosplenomegaly. Bowel sounds positive.  Musculoskeletal: no clubbing / cyanosis. No joint deformity upper and lower extremities. Good ROM, no contractures. Normal muscle tone.  Skin: no rashes, lesions, ulcers. No induration Neurologic: CN 2-12 grossly intact. Sensation intact. Strength 5/5 in all 4.  Psychiatric: Normal judgment and insight. Alert and oriented x 3. Normal mood.   Labs on Admission: I have personally reviewed following labs and imaging studies  CBC: Recent Labs  Lab 09/29/20 1626  WBC 11.0*  HGB 10.2*  HCT 31.2*  MCV 79.0*  PLT 767   Basic Metabolic Panel: Recent Labs  Lab 09/29/20 1626  NA 135  K 5.1  CL 104  CO2 7*  GLUCOSE 741*  BUN 55*  CREATININE 4.74*  CALCIUM 8.7*   GFR: Estimated Creatinine Clearance: 17.2 mL/min (A) (by C-G formula based on SCr of 4.74 mg/dL (H)). Liver Function Tests: No results for input(s): AST, ALT, ALKPHOS, BILITOT, PROT, ALBUMIN in the last 168 hours. No results for input(s): LIPASE, AMYLASE in the last 168 hours. No results for input(s): AMMONIA in the last 168 hours. Coagulation Profile: No results for input(s): INR, PROTIME in the last 168 hours. Cardiac  Enzymes: No results for input(s): CKTOTAL, CKMB, CKMBINDEX, TROPONINI in the last 168 hours. BNP (last 3 results) No results for input(s): PROBNP in the last 8760 hours. HbA1C: No results for input(s): HGBA1C in the last 72 hours. CBG: Recent Labs  Lab 09/29/20 1516 09/29/20 1804  GLUCAP 579* 514*   Lipid Profile: No results for input(s): CHOL, HDL, LDLCALC, TRIG, CHOLHDL, LDLDIRECT in the last 72 hours. Thyroid Function Tests: No results for input(s): TSH, T4TOTAL, FREET4, T3FREE, THYROIDAB in the last 72 hours. Anemia Panel: No results for input(s): VITAMINB12, FOLATE, FERRITIN, TIBC, IRON, RETICCTPCT in the last 72 hours. Urine analysis:    Component Value Date/Time   COLORURINE YELLOW 09/29/2020 1614   APPEARANCEUR CLOUDY (A) 09/29/2020 1614   LABSPEC 1.012 09/29/2020 1614   PHURINE 5.0 09/29/2020 1614   GLUCOSEU >=500 (A) 09/29/2020 1614   HGBUR SMALL (A) 09/29/2020 1614   BILIRUBINUR NEGATIVE 09/29/2020 1614   KETONESUR 80 (A) 09/29/2020 1614  PROTEINUR >=300 (A) 09/29/2020 1614   UROBILINOGEN 0.2 03/21/2015 2010   NITRITE NEGATIVE 09/29/2020 1614   LEUKOCYTESUR NEGATIVE 09/29/2020 1614    Radiological Exams on Admission: No results found.  EKG: Not performed.  Assessment/Plan Principal Problem:   Diabetic ketoacidosis associated with type 1 diabetes mellitus ([place]) Active Problems:   Acute renal failure superimposed on stage 4 chronic kidney disease (HCC)   Hypertension associated with diabetes ([place])  Marletta LIANA CAMERER is a 30 y.o. female with medical history significant for type 1 diabetes on insulin pump, CKD stage IV, hypertension who is admitted for DKA.  DKA associated with type 1 diabetes: -Continue insulin infusion per DKA protocol -Continue IV fluids with LR and transition to D5 in LR when CBG <250 -Follow BMET will transition home insulin pump when able -Keep n.p.o. for now  AKI on CKD stage IV: Likely prerenal from dehydration due to DKA and  GI losses. Continue IV fluids and sodium bicarbonate. Follow renal function.  Hypertension: Resume home amlodipine, hydralazine, metoprolol.  DVT prophylaxis: Subcutaneous heparin Code Status: Full code, confirmed with patient Family Communication: Discussed with patient, she has discussed with family Disposition Plan: From home and likely discharge to home pending resolution of DKA Consults called: None Level of care: Stepdown Admission status:  Status is: Observation  The patient remains OBS appropriate and will d/c before 2 midnights.  Dispo: The patient is from: Home              Anticipated d/c is to: Home              Anticipated d/c date is: 1 day              Patient currently is not medically stable to d/c.   Difficult to place patient No  Zada Finders MD [facility]  If 7PM-7AM, please contact night-coverage [email or web address]  09/29/2020, 6:35 PM

## 2020-09-29 NOTE — Plan of Care (Signed)
Problem: Education: Goal: Knowledge of General Education information will improve Description: Including pain rating scale, medication(s)/side effects and non-pharmacologic comfort measures 09/29/2020 2315 by Jeris Penta, RN Outcome: Progressing 09/29/2020 2314 by Rondall Allegra A, RN Outcome: Progressing   Problem: Health Behavior/Discharge Planning: Goal: Ability to manage health-related needs will improve 09/29/2020 2315 by Jeris Penta, RN Outcome: Progressing 09/29/2020 2314 by Rondall Allegra A, RN Outcome: Progressing   Problem: Clinical Measurements: Goal: Ability to maintain clinical measurements within normal limits will improve 09/29/2020 2315 by Jeris Penta, RN Outcome: Progressing 09/29/2020 2314 by Rondall Allegra A, RN Outcome: Progressing Goal: Will remain free from infection 09/29/2020 2315 by Jeris Penta, RN Outcome: Progressing 09/29/2020 2314 by Rondall Allegra A, RN Outcome: Progressing Goal: Diagnostic test results will improve 09/29/2020 2315 by Jeris Penta, RN Outcome: Progressing 09/29/2020 2314 by Rondall Allegra A, RN Outcome: Progressing Goal: Respiratory complications will improve 09/29/2020 2315 by Jeris Penta, RN Outcome: Progressing 09/29/2020 2314 by Rondall Allegra A, RN Outcome: Progressing Goal: Cardiovascular complication will be avoided 09/29/2020 2315 by Jeris Penta, RN Outcome: Progressing 09/29/2020 2314 by Jeris Penta, RN Outcome: Progressing   Problem: Activity: Goal: Risk for activity intolerance will decrease 09/29/2020 2315 by Rondall Allegra A, RN Outcome: Progressing 09/29/2020 2314 by Rondall Allegra A, RN Outcome: Progressing   Problem: Nutrition: Goal: Adequate nutrition will be maintained 09/29/2020 2315 by Rondall Allegra A, RN Outcome: Progressing 09/29/2020 2314 by Rondall Allegra A, RN Outcome: Progressing   Problem: Coping: Goal: Level of anxiety will decrease 09/29/2020  2315 by Rondall Allegra A, RN Outcome: Progressing 09/29/2020 2314 by Rondall Allegra A, RN Outcome: Progressing   Problem: Elimination: Goal: Will not experience complications related to bowel motility 09/29/2020 2315 by Jeris Penta, RN Outcome: Progressing 09/29/2020 2314 by Rondall Allegra A, RN Outcome: Progressing Goal: Will not experience complications related to urinary retention 09/29/2020 2315 by Jeris Penta, RN Outcome: Progressing 09/29/2020 2314 by Rondall Allegra A, RN Outcome: Progressing   Problem: Pain Managment: Goal: General experience of comfort will improve 09/29/2020 2315 by Jeris Penta, RN Outcome: Progressing 09/29/2020 2314 by Rondall Allegra A, RN Outcome: Progressing   Problem: Safety: Goal: Ability to remain free from injury will improve 09/29/2020 2315 by Rondall Allegra A, RN Outcome: Progressing 09/29/2020 2314 by Rondall Allegra A, RN Outcome: Progressing   Problem: Skin Integrity: Goal: Risk for impaired skin integrity will decrease 09/29/2020 2315 by Rondall Allegra A, RN Outcome: Progressing 09/29/2020 2314 by Rondall Allegra A, RN Outcome: Progressing   Problem: Education: Goal: Ability to describe self-care measures that may prevent or decrease complications (Diabetes Survival Skills Education) will improve 09/29/2020 2315 by Jeris Penta, RN Outcome: Progressing 09/29/2020 2314 by Jeris Penta, RN Outcome: Progressing Goal: Individualized Educational Video(s) 09/29/2020 2315 by Jeris Penta, RN Outcome: Progressing 09/29/2020 2314 by Rondall Allegra A, RN Outcome: Progressing   Problem: Cardiac: Goal: Ability to maintain an adequate cardiac output will improve 09/29/2020 2315 by Jeris Penta, RN Outcome: Progressing 09/29/2020 2314 by Jeris Penta, RN Outcome: Progressing   Problem: Health Behavior/Discharge Planning: Goal: Ability to identify and utilize available resources and services will  improve 09/29/2020 2315 by Jeris Penta, RN Outcome: Progressing 09/29/2020 2314 by Rondall Allegra A, RN Outcome: Progressing Goal: Ability to manage health-related needs will improve 09/29/2020 2315 by Jeris Penta, RN Outcome: Progressing 09/29/2020 2314 by Rondall Allegra A, RN Outcome: Progressing   Problem: Fluid Volume: Goal: Ability  to achieve a balanced intake and output will improve 09/29/2020 2315 by Rondall Allegra A, RN Outcome: Progressing 09/29/2020 2314 by Rondall Allegra A, RN Outcome: Progressing   Problem: Metabolic: Goal: Ability to maintain appropriate glucose levels will improve 09/29/2020 2315 by Rondall Allegra A, RN Outcome: Progressing 09/29/2020 2314 by Rondall Allegra A, RN Outcome: Progressing   Problem: Nutritional: Goal: Maintenance of adequate nutrition will improve 09/29/2020 2315 by Rondall Allegra A, RN Outcome: Progressing 09/29/2020 2314 by Rondall Allegra A, RN Outcome: Progressing Goal: Maintenance of adequate weight for body size and type will improve 09/29/2020 2315 by Jeris Penta, RN Outcome: Progressing 09/29/2020 2314 by Jeris Penta, RN Outcome: Progressing

## 2020-09-29 NOTE — ED Triage Notes (Signed)
Pt arrived via walk in, c/o hyperglycemia x4 days. States CBGs >600 x4 days a long with n/v. States hx of DKA   579CBG in triage.

## 2020-09-29 NOTE — ED Notes (Signed)
Not able to obtain lab work.

## 2020-09-30 DIAGNOSIS — N179 Acute kidney failure, unspecified: Secondary | ICD-10-CM | POA: Diagnosis not present

## 2020-09-30 DIAGNOSIS — E1159 Type 2 diabetes mellitus with other circulatory complications: Secondary | ICD-10-CM

## 2020-09-30 DIAGNOSIS — N184 Chronic kidney disease, stage 4 (severe): Secondary | ICD-10-CM | POA: Diagnosis not present

## 2020-09-30 DIAGNOSIS — E1010 Type 1 diabetes mellitus with ketoacidosis without coma: Secondary | ICD-10-CM | POA: Diagnosis not present

## 2020-09-30 DIAGNOSIS — I152 Hypertension secondary to endocrine disorders: Secondary | ICD-10-CM

## 2020-09-30 LAB — GLUCOSE, CAPILLARY
Glucose-Capillary: 132 mg/dL — ABNORMAL HIGH (ref 70–99)
Glucose-Capillary: 141 mg/dL — ABNORMAL HIGH (ref 70–99)
Glucose-Capillary: 146 mg/dL — ABNORMAL HIGH (ref 70–99)
Glucose-Capillary: 147 mg/dL — ABNORMAL HIGH (ref 70–99)
Glucose-Capillary: 153 mg/dL — ABNORMAL HIGH (ref 70–99)
Glucose-Capillary: 161 mg/dL — ABNORMAL HIGH (ref 70–99)
Glucose-Capillary: 168 mg/dL — ABNORMAL HIGH (ref 70–99)
Glucose-Capillary: 176 mg/dL — ABNORMAL HIGH (ref 70–99)
Glucose-Capillary: 187 mg/dL — ABNORMAL HIGH (ref 70–99)
Glucose-Capillary: 199 mg/dL — ABNORMAL HIGH (ref 70–99)
Glucose-Capillary: 201 mg/dL — ABNORMAL HIGH (ref 70–99)
Glucose-Capillary: 201 mg/dL — ABNORMAL HIGH (ref 70–99)
Glucose-Capillary: 213 mg/dL — ABNORMAL HIGH (ref 70–99)
Glucose-Capillary: 230 mg/dL — ABNORMAL HIGH (ref 70–99)
Glucose-Capillary: 237 mg/dL — ABNORMAL HIGH (ref 70–99)
Glucose-Capillary: 257 mg/dL — ABNORMAL HIGH (ref 70–99)
Glucose-Capillary: 97 mg/dL (ref 70–99)

## 2020-09-30 LAB — BASIC METABOLIC PANEL
Anion gap: 11 (ref 5–15)
Anion gap: 14 (ref 5–15)
Anion gap: 14 (ref 5–15)
Anion gap: 15 (ref 5–15)
BUN: 45 mg/dL — ABNORMAL HIGH (ref 6–20)
BUN: 45 mg/dL — ABNORMAL HIGH (ref 6–20)
BUN: 49 mg/dL — ABNORMAL HIGH (ref 6–20)
BUN: 51 mg/dL — ABNORMAL HIGH (ref 6–20)
CO2: 15 mmol/L — ABNORMAL LOW (ref 22–32)
CO2: 15 mmol/L — ABNORMAL LOW (ref 22–32)
CO2: 16 mmol/L — ABNORMAL LOW (ref 22–32)
CO2: 19 mmol/L — ABNORMAL LOW (ref 22–32)
Calcium: 8.5 mg/dL — ABNORMAL LOW (ref 8.9–10.3)
Calcium: 8.5 mg/dL — ABNORMAL LOW (ref 8.9–10.3)
Calcium: 8.6 mg/dL — ABNORMAL LOW (ref 8.9–10.3)
Calcium: 8.7 mg/dL — ABNORMAL LOW (ref 8.9–10.3)
Chloride: 112 mmol/L — ABNORMAL HIGH (ref 98–111)
Chloride: 113 mmol/L — ABNORMAL HIGH (ref 98–111)
Chloride: 113 mmol/L — ABNORMAL HIGH (ref 98–111)
Chloride: 114 mmol/L — ABNORMAL HIGH (ref 98–111)
Creatinine, Ser: 4.1 mg/dL — ABNORMAL HIGH (ref 0.44–1.00)
Creatinine, Ser: 4.13 mg/dL — ABNORMAL HIGH (ref 0.44–1.00)
Creatinine, Ser: 4.35 mg/dL — ABNORMAL HIGH (ref 0.44–1.00)
Creatinine, Ser: 4.39 mg/dL — ABNORMAL HIGH (ref 0.44–1.00)
GFR, Estimated: 13 mL/min — ABNORMAL LOW (ref >60)
GFR, Estimated: 13 mL/min — ABNORMAL LOW (ref >60)
GFR, Estimated: 14 mL/min — ABNORMAL LOW (ref >60)
GFR, Estimated: 14 mL/min — ABNORMAL LOW (ref >60)
Glucose, Bld: 172 mg/dL — ABNORMAL HIGH (ref 70–99)
Glucose, Bld: 173 mg/dL — ABNORMAL HIGH (ref 70–99)
Glucose, Bld: 183 mg/dL — ABNORMAL HIGH (ref 70–99)
Glucose, Bld: 192 mg/dL — ABNORMAL HIGH (ref 70–99)
Potassium: 3.9 mmol/L (ref 3.5–5.1)
Potassium: 4.1 mmol/L (ref 3.5–5.1)
Potassium: 4.1 mmol/L (ref 3.5–5.1)
Potassium: 4.4 mmol/L (ref 3.5–5.1)
Sodium: 142 mmol/L (ref 135–145)
Sodium: 142 mmol/L (ref 135–145)
Sodium: 143 mmol/L (ref 135–145)
Sodium: 144 mmol/L (ref 135–145)

## 2020-09-30 LAB — HEMOGLOBIN A1C
Hgb A1c MFr Bld: 9.2 % — ABNORMAL HIGH (ref 4.8–5.6)
Mean Plasma Glucose: 217.34 mg/dL

## 2020-09-30 LAB — SARS CORONAVIRUS 2 (TAT 6-24 HRS): SARS Coronavirus 2: NEGATIVE

## 2020-09-30 LAB — MRSA PCR SCREENING: MRSA by PCR: NEGATIVE

## 2020-09-30 MED ORDER — HYDRALAZINE HCL 20 MG/ML IJ SOLN
5.0000 mg | Freq: Three times a day (TID) | INTRAMUSCULAR | Status: DC | PRN
Start: 2020-09-30 — End: 2020-10-01
  Administered 2020-09-30: 5 mg via INTRAVENOUS
  Filled 2020-09-30: qty 1

## 2020-09-30 MED ORDER — INSULIN GLARGINE 100 UNIT/ML ~~LOC~~ SOLN
10.0000 [IU] | Freq: Once | SUBCUTANEOUS | Status: AC
  Administered 2020-09-30: 10 [IU] via SUBCUTANEOUS
  Filled 2020-09-30: qty 0.1

## 2020-09-30 MED ORDER — INSULIN GLARGINE 100 UNIT/ML ~~LOC~~ SOLN
12.0000 [IU] | Freq: Once | SUBCUTANEOUS | Status: AC
  Administered 2020-09-30: 12 [IU] via SUBCUTANEOUS
  Filled 2020-09-30: qty 0.12

## 2020-09-30 MED ORDER — INSULIN ASPART 100 UNIT/ML ~~LOC~~ SOLN: 6.0000 [IU] | Freq: Three times a day (TID) | SUBCUTANEOUS | Status: DC

## 2020-09-30 MED ORDER — INSULIN ASPART 100 UNIT/ML ~~LOC~~ SOLN: 0.0000 [IU] | Freq: Every day | SUBCUTANEOUS | Status: DC

## 2020-09-30 MED ORDER — ORAL CARE MOUTH RINSE
15.0000 mL | Freq: Two times a day (BID) | OROMUCOSAL | Status: DC
  Administered 2020-09-30: 15 mL via OROMUCOSAL

## 2020-09-30 MED ORDER — INSULIN GLARGINE 100 UNIT/ML ~~LOC~~ SOLN: 22.0000 [IU] | Freq: Every day | SUBCUTANEOUS | 0 refills | Status: DC

## 2020-09-30 MED ORDER — INSULIN LISPRO 100 UNIT/ML ~~LOC~~ SOLN: SUBCUTANEOUS | 3 refills | Status: AC

## 2020-09-30 MED ORDER — CHLORHEXIDINE GLUCONATE CLOTH 2 % EX PADS: 6.0000 | MEDICATED_PAD | Freq: Every day | CUTANEOUS | Status: DC

## 2020-09-30 MED ORDER — INSULIN ASPART 100 UNIT/ML ~~LOC~~ SOLN: 0.0000 [IU] | Freq: Three times a day (TID) | SUBCUTANEOUS | Status: DC

## 2020-09-30 NOTE — Progress Notes (Signed)
Discharge instructions reviewed with patient. Education reviewed. PIVs d/c by nurse tech. All questions answered. Telemetry removed. Pt belongings sent with patient. Pt discharged by wheelchair.

## 2020-09-30 NOTE — TOC Initial Note (Signed)
Transition of Care [facility]) - Initial/Assessment Note    Patient Details  Name: Kimberly [name] MRN: [id number] Date of Birth: [birth year]  [facility] of [facility]) CM/SW Contact:    Leeroy Cha, RN Phone Number: 09/30/2020, 8:18 AM  Clinical Narrative:                  30 y.o. female with medical history significant for type 1 diabetes on insulin pump, CKD stage IV, hypertension who presents to the ED for evaluation of nausea, vomiting, and hyperglycemia.  Patient states 2 days ago she noticed her blood sugars were elevated. Home glucometer read greater than 600. She initially thought it was an issue with her pump and tried manual injections however blood sugars remained elevated. She has had poor oral intake and able to take her home medications. Today she developed nausea and vomiting. She called her endocrinologist who advised she come to the ED for evaluation as they both felt that she was developing DKA. Patient otherwise denies any subjective fevers, chills, diaphoresis, chest pain, dyspnea, or dysuria. PLAN: to return to home with self care, lives alone has a father as her contact.  Expected Discharge Plan: Home/Self Care Barriers to Discharge: Continued Medical Work up   Patient Goals and CMS Choice Patient states their goals for this hospitalization and ongoing recovery are:: to go back home CMS Medicare.gov Compare Post Acute Care list provided to:: Patient    Expected Discharge Plan and Services Expected Discharge Plan: Home/Self Care   Discharge Planning Services: CM Consult   Living arrangements for the past 2 months: Single Family Home                                      Prior Living Arrangements/Services Living arrangements for the past 2 months: Single Family Home Lives with:: Self Patient language and need for interpreter reviewed:: Yes Do you feel safe going back to the place where you live?: Yes      Need for Family Participation in Patient  Care: No (Comment) Care giver support system in place?: No (comment)   Criminal Activity/Legal Involvement Pertinent to Current Situation/Hospitalization: No - Comment as needed  Activities of Daily Living Home Assistive Devices/Equipment: Insulin Pump,CBG Meter,Eyeglasses ADL Screening (condition at time of admission) Patient's cognitive ability adequate to safely complete daily activities?: Yes Is the patient deaf or have difficulty hearing?: No Does the patient have difficulty seeing, even when wearing glasses/contacts?: No Does the patient have difficulty concentrating, remembering, or making decisions?: No Patient able to express need for assistance with ADLs?: Yes Does the patient have difficulty dressing or bathing?: No Independently performs ADLs?: Yes (appropriate for developmental age) Does the patient have difficulty walking or climbing stairs?: No Weakness of Legs: None Weakness of Arms/Hands: None  Permission Sought/Granted                  Emotional Assessment Appearance:: Appears stated age Attitude/Demeanor/Rapport: Engaged Affect (typically observed): Calm Orientation: : Oriented to Place,Oriented to Self,Oriented to  Time,Oriented to Situation Alcohol / Substance Use: Not Applicable Psych Involvement: No (comment)  Admission diagnosis:  AKI (acute kidney injury) ([place]) [N17.9] Diabetic ketoacidosis without coma associated with type 1 diabetes mellitus ([place]) [E10.10] Diabetic ketoacidosis associated with type 1 diabetes mellitus ([place]) [E10.10] Hypertension, unspecified type [I10] Patient Active Problem List   Diagnosis Date Noted  . Diabetic ketoacidosis associated with type 1 diabetes  mellitus ([place]) 09/29/2020  . CKD (chronic kidney disease) stage 4, GFR 15-29 ml/min (HCC) 08/07/2020  . Anemia in chronic kidney disease 08/07/2020  . Overweight (BMI 25.0-29.9) 11/30/2019  . Acute lower UTI 11/29/2019  . Acute renal failure superimposed on stage 4 chronic  kidney disease ([place]) 11/29/2019  . Hypertension associated with diabetes ([place]) 11/29/2019  . Hypokalemia 09/06/2018  . Hypertensive urgency 09/06/2018  . Leukocytosis 09/06/2018  . GERD (gastroesophageal reflux disease) 09/06/2018  . Gastroparesis   . Intractable nausea and vomiting 08/29/2018  . Microcytic anemia 08/29/2018  . CIN III (cervical intraepithelial neoplasia grade III) with severe dysplasia 09/09/2017  . DKA, type 1 ([place]) 03/17/2015  . Diabetic nephropathy associated with type 1 diabetes mellitus ([place]) 03/17/2015  . Type 1 diabetes mellitus with diabetic nephropathy ([place]) 05/25/2013   PCP:  Jacelyn Pi, MD Pharmacy:   [facility] [place], [address] AT [address] [place] [address] Phone: [phone] Fax: [phone]  [facility] [place], [address] [place] [address] Phone: [phone] Fax: [phone]     Social Determinants of Health (SDOH) Interventions    Readmission Risk Interventions No flowsheet data found.

## 2020-09-30 NOTE — Plan of Care (Signed)
  Problem: Education: Goal: Knowledge of General Education information will improve Description: Including pain rating scale, medication(s)/side effects and non-pharmacologic comfort measures Outcome: Adequate for Discharge   Problem: Health Behavior/Discharge Planning: Goal: Ability to manage health-related needs will improve Outcome: Adequate for Discharge   Problem: Clinical Measurements: Goal: Ability to maintain clinical measurements within normal limits will improve Outcome: Adequate for Discharge Goal: Will remain free from infection Outcome: Adequate for Discharge Goal: Diagnostic test results will improve Outcome: Adequate for Discharge Goal: Respiratory complications will improve Outcome: Adequate for Discharge Goal: Cardiovascular complication will be avoided Outcome: Adequate for Discharge   Problem: Activity: Goal: Risk for activity intolerance will decrease Outcome: Adequate for Discharge   Problem: Nutrition: Goal: Adequate nutrition will be maintained Outcome: Adequate for Discharge   Problem: Coping: Goal: Level of anxiety will decrease Outcome: Adequate for Discharge   Problem: Elimination: Goal: Will not experience complications related to bowel motility Outcome: Adequate for Discharge Goal: Will not experience complications related to urinary retention Outcome: Adequate for Discharge   Problem: Pain Managment: Goal: General experience of comfort will improve Outcome: Adequate for Discharge   Problem: Safety: Goal: Ability to remain free from injury will improve Outcome: Adequate for Discharge   Problem: Skin Integrity: Goal: Risk for impaired skin integrity will decrease Outcome: Adequate for Discharge   Problem: Education: Goal: Ability to describe self-care measures that may prevent or decrease complications (Diabetes Survival Skills Education) will improve Outcome: Adequate for Discharge Goal: Individualized Educational Video(s) Outcome:  Adequate for Discharge   Problem: Cardiac: Goal: Ability to maintain an adequate cardiac output will improve Outcome: Adequate for Discharge   Problem: Health Behavior/Discharge Planning: Goal: Ability to identify and utilize available resources and services will improve Outcome: Adequate for Discharge Goal: Ability to manage health-related needs will improve Outcome: Adequate for Discharge   Problem: Fluid Volume: Goal: Ability to achieve a balanced intake and output will improve Outcome: Adequate for Discharge   Problem: Metabolic: Goal: Ability to maintain appropriate glucose levels will improve Outcome: Adequate for Discharge   Problem: Nutritional: Goal: Maintenance of adequate nutrition will improve Outcome: Adequate for Discharge Goal: Maintenance of adequate weight for body size and type will improve Outcome: Adequate for Discharge   Problem: Respiratory: Goal: Will regain and/or maintain adequate ventilation Outcome: Adequate for Discharge   Problem: Urinary Elimination: Goal: Ability to achieve and maintain adequate renal perfusion and functioning will improve Outcome: Adequate for Discharge   

## 2020-09-30 NOTE — Discharge Summary (Signed)
Physician Discharge Summary  Kimberly [name] [id number] DOB: 1990/10/12 DOA: 09/29/2020  PCP: Jacelyn Pi, MD  Admit date: 09/29/2020 Discharge date: 09/30/2020  Admitted From: Home  Discharge disposition: Home   Recommendations for Outpatient Follow-Up:   . Follow up with your primary care provider in one week.  . Check CBC, BMP, magnesium in the next visit   Discharge Diagnosis:   Principal Problem:   Diabetic ketoacidosis associated with type 1 diabetes mellitus ([place]) Active Problems:   Acute renal failure superimposed on stage 4 chronic kidney disease ([place])   Hypertension associated with diabetes ([place])   Discharge Condition: Improved.  Diet recommendation:  Carbohydrate-modified.   Wound care: None.  Code status: Full.   History of Present Illness:   Kimberly [name] is a 30 y.o. female with medical history significant for type 1 diabetes on insulin pump, CKD stage IV, hypertension presented to the hospital with nausea vomiting and hyperglycemia with her home  glucometer read greater than 600. She initially thought it was an issue with her pump and tried manual injections however blood sugars remained elevated.  She called her endocrinologist who advised she come to the ED for evaluation due to suspicion for DKA.  In the ED, patient was noted to be hypertensive.  Blood glucose level of 741 bicarb of 7 creatinine of 4.7. Anion gap 24, sodium 135 (150 when corrected for hyperglycemia), potassium 5.1. Urinalysis showed >500 glucose, 80 ketones, >300 protein, negative nitrites, negative leukocytes, rare bacteria on microscopy. Patient was given IV fluids and insulin drip and was admitted to hospital for further evaluation and treatment.  Hospital Course:   Following conditions were addressed during hospitalization as listed below,  Diabetic ketoacidosis associated with type 1 diabetes: Patient received insulin drip as per protocol.  She was seen by diabetic  coordinator.  At this time she will be continued on long-acting and sliding scale insulin at home and will need to follow-up with her endocrinologist Dr. Suzette Battiest to discuss further management.  Patient is aware of this.    AKI on CKD stage IV: Likely prerenal from dehydration due to DKA and GI losses.  Patient received IV fluid hydration during hospitalization.  Essential hypertension: Resume home amlodipine, hydralazine, metoprolol on discharge.  Disposition.  At this time, patient is stable for disposition home with outpatient endocrinology follow-up  Medical Consultants:    None.  Procedures:    None Subjective:   Today, patient was seen and examined at bedside.  Denies any nausea vomiting abdominal pain fever chills.  Discharge Exam:   Vitals:   09/30/20 1200 09/30/20 1600  BP: (!) 150/83   Pulse: 81   Resp: 14   Temp: 98.9 F (37.2 C) 99.1 F (37.3 C)  SpO2: 100%    Vitals:   09/30/20 1000 09/30/20 1100 09/30/20 1200 09/30/20 1600  BP:  (!) 148/78 (!) 150/83   Pulse: 84 77 81   Resp: 12 11 14    Temp:   98.9 F (37.2 C) 99.1 F (37.3 C)  TempSrc:   Oral Oral  SpO2: 99% 100% 100%   Weight:      Height:       General: Alert awake, not in obvious distress HENT: pupils equally reacting to light,  No scleral pallor or icterus noted. Oral mucosa is moist.  Chest:  Clear breath sounds.  Diminished breath sounds bilaterally. No crackles or wheezes.  CVS: S1 &S2 heard. No murmur.  Regular rate and rhythm. Abdomen: Soft, nontender, nondistended.  Bowel sounds are heard.   Extremities: No cyanosis, clubbing or edema.  Peripheral pulses are palpable. Psych: Alert, awake and oriented, normal mood CNS:  No cranial nerve deficits.  Power equal in all extremities.   Skin: Warm and dry.  No rashes noted.  The results of significant diagnostics from this hospitalization (including imaging, microbiology, ancillary and laboratory) are listed below for reference.      Diagnostic Studies:   No results found.   Labs:   Basic Metabolic Panel: Recent Labs  Lab 09/29/20 2150 09/29/20 2347 09/30/20 0306 09/30/20 0802 09/30/20 1113  NA 142 142 142 144 143  K 3.8 4.4 4.1 3.9 4.1  CL 113* 112* 113* 114* 113*  CO2 16* 15* 15* 16* 19*  GLUCOSE 146* 192* 183* 173* 172*  BUN 50* 51* 49* 45* 45*  CREATININE 4.29* 4.39* 4.10* 4.13* 4.35*  CALCIUM 8.9 8.6* 8.5* 8.5* 8.7*   GFR Estimated Creatinine Clearance: 18.7 mL/min (A) (by C-G formula based on SCr of 4.35 mg/dL (H)). Liver Function Tests: No results for input(s): AST, ALT, ALKPHOS, BILITOT, PROT, ALBUMIN in the last 168 hours. No results for input(s): LIPASE, AMYLASE in the last 168 hours. No results for input(s): AMMONIA in the last 168 hours. Coagulation profile No results for input(s): INR, PROTIME in the last 168 hours.  CBC: Recent Labs  Lab 09/29/20 1626  WBC 11.0*  HGB 10.2*  HCT 31.2*  MCV 79.0*  PLT 304   Cardiac Enzymes: No results for input(s): CKTOTAL, CKMB, CKMBINDEX, TROPONINI in the last 168 hours. BNP: Invalid input(s): POCBNP CBG: Recent Labs  Lab 09/30/20 1118 09/30/20 1203 09/30/20 1302 09/30/20 1434 09/30/20 1607  GLUCAP 153* 199* 213* 168* 146*   D-Dimer No results for input(s): DDIMER in the last 72 hours. Hgb A1c Recent Labs    09/29/20 2150  HGBA1C 9.2*   Lipid Profile No results for input(s): CHOL, HDL, LDLCALC, TRIG, CHOLHDL, LDLDIRECT in the last 72 hours. Thyroid function studies No results for input(s): TSH, T4TOTAL, T3FREE, THYROIDAB in the last 72 hours.  Invalid input(s): FREET3 Anemia work up No results for input(s): VITAMINB12, FOLATE, FERRITIN, TIBC, IRON, RETICCTPCT in the last 72 hours. Microbiology Recent Results (from the past 240 hour(s))  SARS CORONAVIRUS 2 (TAT 6-24 HRS) Nasopharyngeal Nasopharyngeal Swab     Status: None   Collection Time: 09/29/20  6:19 PM   Specimen: Nasopharyngeal Swab  Result Value Ref Range  Status   SARS Coronavirus 2 NEGATIVE NEGATIVE Final    Comment: (NOTE) SARS-CoV-2 target nucleic acids are NOT DETECTED.  The SARS-CoV-2 RNA is generally detectable in upper and lower respiratory specimens during the acute phase of infection. Negative results do not preclude SARS-CoV-2 infection, do not rule out co-infections with other pathogens, and should not be used as the sole basis for treatment or other patient management decisions. Negative results must be combined with clinical observations, patient history, and epidemiological information. The expected result is Negative.  Fact Sheet for Patients: SugarRoll.be  Fact Sheet for Healthcare Providers: https://www.woods-mathews.com/  This test is not yet approved or cleared by the [place] FDA and  has been authorized for detection and/or diagnosis of SARS-CoV-2 by FDA under an Emergency Use Authorization (EUA). This EUA will remain  in effect (meaning this test can be used) for the duration of the COVID-19 declaration under Se ction 564(b)(1) of the Act, 21 U.S.C. section 360bbb-3(b)(1), unless the authorization is terminated or revoked sooner.  Performed at [facility], [address], [place], [place] [address]   MRSA PCR Screening     Status: None   Collection Time: 09/30/20  6:01 AM   Specimen: Nasal Mucosa; Nasopharyngeal  Result Value Ref Range Status   MRSA by PCR NEGATIVE NEGATIVE Final    Comment:        The GeneXpert MRSA Assay (FDA approved for NASAL specimens only), is one component of a comprehensive MRSA colonization surveillance program. It is not intended to diagnose MRSA infection nor to guide or monitor treatment for MRSA infections. Performed at [facility], [place] [address] [place], [place] [address]      Discharge Instructions:   Discharge Instructions    Diet Carb Modified   Complete by: As directed    Discharge  instructions   Complete by: As directed    Please take a sliding scale insulin, premeal insulin and long-acting insulin as recommended.  Follow-up with your endocrinologist  Dr Suzette Battiest within 1 week or as soon as possible.  Do not take insulin via pump until after seen by Dr. Suzette Battiest endocrinology.  . Long acting insulin has been prescribed.   Increase activity slowly   Complete by: As directed      Allergies as of 09/30/2020      Reactions   Reglan [metoclopramide] Other (See Comments)   tardive dyskinesia per outside provider note      Medication List    TAKE these medications   amLODipine 5 MG tablet Commonly known as: NORVASC Take 5 mg by mouth daily.   Bayer Contour Next Test test strip Generic drug: glucose blood USE AS DIRECTED TO TEST 6 TO 8 TIMES DAILY What changed: See the new instructions.   CONTOUR NEXT EZ MONITOR w/Device Kit 1 each by Does not apply route 6 (six) times daily. Provide lancets for testing six times daily. Type I DM with diabetes on Insulin Pump  DX 648.03   cyanocobalamin 1000 MCG/ML injection Commonly known as: (VITAMIN B-12) Inject 1 mL into the muscle every 30 (thirty) days.   etonogestrel 68 MG Impl implant Commonly known as: NEXPLANON Inject 1 each into the skin once.   hydrALAZINE 25 MG tablet Commonly known as: APRESOLINE Take 25 mg by mouth 3 (three) times daily.   insulin glargine 100 UNIT/ML injection Commonly known as: LANTUS Inject 0.22 mLs (22 Units total) into the skin daily. Start taking on: October 01, 2020   insulin lispro 100 UNIT/ML injection Commonly known as: HUMALOG Inject 3-6 Units into the skin 3 (three) times daily before meals. Via insulin pump; INJECT 100 UNITS MAX DAILY DOSEAGE VIA INSULIN PUMP UNDER THE SKIN What changed: Another medication with the same name was added. Make sure you understand how and when to take each.   insulin lispro 100 UNIT/ML injection Commonly known as: HUMALOG 0-9 Units TID and 0-5 units  at bedside as per sliding scale What changed: You were already taking a medication with the same name, and this prescription was added. Make sure you understand how and when to take each.   insulin pump Soln Inject into the skin. Humalog   metoprolol tartrate 50 MG tablet Commonly known as: LOPRESSOR Take 1 tablet (50 mg total) by mouth 2 (two) times daily.   sodium bicarbonate 650 MG tablet Take 650 mg by mouth 2 (two) times daily.       Follow-up Information    Jacelyn Pi, MD Follow up.   Specialty: Endocrinology Why: as soon as possible Contact information:  [place] [address] [phone]                Time coordinating discharge: 39 minutes  Signed:  Tyarra Nolton  [facility] 09/30/2020, 4:55 PM

## 2020-09-30 NOTE — Progress Notes (Signed)
Inpatient Diabetes Program Recommendations  AACE/ADA: New Consensus Statement on Inpatient Glycemic Control (2015)  Target Ranges:  Prepandial:   less than 140 mg/dL      Peak postprandial:   less than 180 mg/dL (1-2 hours)      Critically ill patients:  140 - 180 mg/dL   Lab Results  Component Value Date   GLUCAP 213 (H) 09/30/2020   HGBA1C 9.2 (H) 09/29/2020    Review of Glycemic Control  Diabetes history: DM1 Outpatient Diabetes medications: Medtronic Insulin Pump Current orders for Inpatient glycemic control: IV insulin per Endo[facility] for DKA transitioning to Lantus 10 units QD  HgbA1C - 9.2% Endo - [place]  Pump settings: Basal rates 0.625 units/ hr 1 unit/10 grams of CHO 1 unit drops blood sugars approximately 75 mg/dL- Goal =150 mg/dL  Inpatient Diabetes Program Recommendations:     Spoke with pt at bedside regarding her diabetes and DKA. Pt states her blood sugars kept going up and she even changed site, new insertion set and blood sugars did not come down. Called and spoke to Dr Suzette Battiest who said to take pump off and give Lantus 22 units. She did this and blood sugars continued to go higher. Then pt was nauseated, vomited and her Endo said to go to the ED. Pt said there was no kinks in tubing. Does say she has scar tissue in abdomen. Pt had just seen Endo within a week ago and said she changed her CHO ratio to 1:10 instead of 1:12. Endo instructed pt to keep pump off until she has looked at it. Have sent secure text to MD to increase Lantus to 22 units QD, add Novolog 0-9 units TID and 0-5 units QHS + 6 units TID with meals if eating > 50% meal.   Discussed above with RN. Waiting to hear back from MD regarding orders. Pt wanting to go home later today, if possible. Pt will call 1-800 number on pump to make sure it wasn't malfunctioning. States she will make appt with Dr Suzette Battiest when she gets home. Has both Lantus and Humalog at home.   Follow.  Thank you. Lorenda Peck, RD, LDN,  CDE Inpatient Diabetes Coordinator [phone]

## 2020-10-02 ENCOUNTER — Encounter (HOSPITAL_COMMUNITY): Payer: Self-pay

## 2020-10-19 ENCOUNTER — Emergency Department (HOSPITAL_COMMUNITY)
Admission: EM | Admit: 2020-10-19 | Discharge: 2020-10-19 | Disposition: A | Discharge status: Home / Self Care | Payer: No Typology Code available for payment source | Attending: Emergency Medicine | Admitting: Emergency Medicine

## 2020-10-19 ENCOUNTER — Encounter (HOSPITAL_COMMUNITY): Payer: Self-pay

## 2020-10-19 ENCOUNTER — Emergency Department (HOSPITAL_COMMUNITY): Payer: No Typology Code available for payment source

## 2020-10-19 DIAGNOSIS — E1010 Type 1 diabetes mellitus with ketoacidosis without coma: Secondary | ICD-10-CM | POA: Insufficient documentation

## 2020-10-19 DIAGNOSIS — W101XXA Fall (on)(from) sidewalk curb, initial encounter: Secondary | ICD-10-CM | POA: Insufficient documentation

## 2020-10-19 DIAGNOSIS — D631 Anemia in chronic kidney disease: Secondary | ICD-10-CM | POA: Diagnosis not present

## 2020-10-19 DIAGNOSIS — Z79899 Other long term (current) drug therapy: Secondary | ICD-10-CM | POA: Insufficient documentation

## 2020-10-19 DIAGNOSIS — I129 Hypertensive chronic kidney disease with stage 1 through stage 4 chronic kidney disease, or unspecified chronic kidney disease: Secondary | ICD-10-CM | POA: Diagnosis not present

## 2020-10-19 DIAGNOSIS — N184 Chronic kidney disease, stage 4 (severe): Secondary | ICD-10-CM | POA: Insufficient documentation

## 2020-10-19 DIAGNOSIS — E1040 Type 1 diabetes mellitus with diabetic neuropathy, unspecified: Secondary | ICD-10-CM | POA: Insufficient documentation

## 2020-10-19 DIAGNOSIS — S99912A Unspecified injury of left ankle, initial encounter: Secondary | ICD-10-CM | POA: Diagnosis present

## 2020-10-19 DIAGNOSIS — E1022 Type 1 diabetes mellitus with diabetic chronic kidney disease: Secondary | ICD-10-CM | POA: Diagnosis not present

## 2020-10-19 DIAGNOSIS — S93402A Sprain of unspecified ligament of left ankle, initial encounter: Secondary | ICD-10-CM | POA: Diagnosis not present

## 2020-10-19 NOTE — Discharge Instructions (Addendum)
Call your primary care doctor or specialist as discussed in the next 5-6 days.  Return immediately back to the ER if:  Your symptoms worsen within the next 12-24 hours. You develop new symptoms such as new fevers, persistent vomiting, new pain, shortness of breath, or new weakness or numbness, or if you have any other concerns.

## 2020-10-19 NOTE — ED Notes (Signed)
An After Visit Summary was printed and given to the patient. Discharge instructions given and no further questions at this time.  

## 2020-10-19 NOTE — ED Provider Notes (Signed)
[facility] DEPT Provider Note   CSN: [id number] Arrival date & time: 10/19/20  1114     History Chief Complaint  Patient presents with  . Ankle Injury    Kimberly [name] is a 30 y.o. female.  Patient presents ER chief complaint of left ankle pain.  Describes it as sharp and aching worse when she puts weight on it.  States that she tripped over a curb yesterday and has had persistent pain since then.  Denies fall or injury elsewhere.          Past Medical History:  Diagnosis Date  . Diabetes mellitus without complication ([place])   . Preeclampsia   . Preeclampsia without severe features   . Sickle cell trait [facility])     Patient Active Problem List   Diagnosis Date Noted  . Diabetic ketoacidosis associated with type 1 diabetes mellitus ([place]) 09/29/2020  . CKD (chronic kidney disease) stage 4, GFR 15-29 ml/min (HCC) 08/07/2020  . Anemia in chronic kidney disease 08/07/2020  . Overweight (BMI 25.0-29.9) 11/30/2019  . Acute lower UTI 11/29/2019  . Acute renal failure superimposed on stage 4 chronic kidney disease ([place]) 11/29/2019  . Hypertension associated with diabetes ([place]) 11/29/2019  . Hypokalemia 09/06/2018  . Hypertensive urgency 09/06/2018  . Leukocytosis 09/06/2018  . GERD (gastroesophageal reflux disease) 09/06/2018  . Gastroparesis   . Intractable nausea and vomiting 08/29/2018  . Microcytic anemia 08/29/2018  . CIN III (cervical intraepithelial neoplasia grade III) with severe dysplasia 09/09/2017  . DKA, type 1 ([place]) 03/17/2015  . Diabetic nephropathy associated with type 1 diabetes mellitus ([place]) 03/17/2015  . Type 1 diabetes mellitus with diabetic nephropathy ([place]) 05/25/2013    Past Surgical History:  Procedure Laterality Date  . ESOPHAGOGASTRODUODENOSCOPY  08/2018   [facility] [place] and a gastric enptying study   . WISDOM TOOTH EXTRACTION       OB History    Gravida  1   Para  1   Term      Preterm  1    AB      Living  1     SAB      IAB      Ectopic      Multiple      Live Births  1           Family History  Problem Relation Age of Onset  . Lupus Mother   . Aneurysm Mother   . Colon polyps Maternal Aunt        great aunt  . Colon cancer Neg Hx   . Esophageal cancer Neg Hx     Social History   Tobacco Use  . Smoking status: Never Smoker  . Smokeless tobacco: Never Used  Vaping Use  . Vaping Use: Never used  Substance Use Topics  . Alcohol use: No  . Drug use: No    Home Medications Prior to Admission medications   Medication Sig Start Date End Date Taking? Authorizing Provider  amLODipine (NORVASC) 5 MG tablet Take 5 mg by mouth daily. 09/23/20   [provider]  BAYER CONTOUR NEXT TEST test strip USE AS DIRECTED TO TEST 6 TO 8 TIMES DAILY Patient taking differently: 1 each by Other route. 10/01/14   Woodroe Mode, MD  Blood Glucose Monitoring Suppl (CONTOUR NEXT EZ MONITOR) W/DEVICE KIT 1 each by Does not apply route 6 (six) times daily. Provide lancets for testing six times daily. Type I DM with diabetes on  Insulin Pump  DX 648.03 06/17/13   Woodroe Mode, MD  cyanocobalamin (,VITAMIN B-12,) 1000 MCG/ML injection Inject 1 mL into the muscle every 30 (thirty) days. 06/03/20   [provider]  etonogestrel (NEXPLANON) 68 MG IMPL implant Inject 1 each into the skin once.    [provider]  hydrALAZINE (APRESOLINE) 25 MG tablet Take 25 mg by mouth 3 (three) times daily. 09/23/20   [provider]  insulin glargine (LANTUS) 100 UNIT/ML injection Inject 0.22 mLs (22 Units total) into the skin daily. 10/01/20 10/31/20  Pokhrel, Corrie Mckusick, MD  Insulin Human (INSULIN PUMP) SOLN Inject into the skin. Humalog    [provider]  insulin lispro (HUMALOG) 100 UNIT/ML injection Inject 3-6 Units into the skin 3 (three) times daily before meals. Via insulin pump; INJECT 100 UNITS MAX DAILY DOSEAGE VIA INSULIN PUMP UNDER THE SKIN     [provider]  insulin lispro (HUMALOG) 100 UNIT/ML injection 0-9 Units TID and 0-5 units at bedside as per sliding scale 09/30/20 09/30/21  Pokhrel, Corrie Mckusick, MD  metoprolol tartrate (LOPRESSOR) 50 MG tablet Take 1 tablet (50 mg total) by mouth 2 (two) times daily. 03/23/18   Doreatha Lew, MD  sodium bicarbonate 650 MG tablet Take 650 mg by mouth 2 (two) times daily. 07/15/20   [provider]    Allergies    Reglan [metoclopramide]  Review of Systems   Review of Systems  Constitutional: Negative for fever.  HENT: Negative for ear pain.   Eyes: Negative for pain.  Respiratory: Negative for cough.   Cardiovascular: Negative for chest pain.  Gastrointestinal: Negative for abdominal pain.  Genitourinary: Negative for flank pain.  Musculoskeletal: Negative for back pain.  Skin: Negative for rash.  Neurological: Negative for headaches.    Physical Exam Updated Vital Signs BP (!) 185/102 (BP Location: Left Arm)   Pulse 89   Temp 99.1 F (37.3 C) (Oral)   Resp 16   SpO2 100%   Physical Exam Constitutional:      General: She is not in acute distress.    Appearance: Normal appearance.  HENT:     Head: Normocephalic.     Nose: Nose normal.  Eyes:     Extraocular Movements: Extraocular movements intact.  Cardiovascular:     Rate and Rhythm: Normal rate.  Pulmonary:     Effort: Pulmonary effort is normal.  Musculoskeletal:     Cervical back: Normal range of motion.     Comments: Mild pain with active range of motion of the left ankle.  Swelling and tenderness seen in the lateral malleoli region.  Neurovascularly tact otherwise.  Neurological:     General: No focal deficit present.     Mental Status: She is alert. Mental status is at baseline.     ED Results / Procedures / Treatments   Labs (all labs ordered are listed, but only abnormal results are displayed) Labs Reviewed - No data to display  EKG None  Radiology DG Ankle Complete  Left  Result Date: 10/19/2020 CLINICAL DATA:  Twisting injury.  Lateral pain. EXAM: LEFT ANKLE COMPLETE - 3+ VIEW COMPARISON:  None. FINDINGS: Lateral greater than medial soft tissue swelling. No acute fracture or dislocation. Base of fifth metatarsal and talar dome intact. Vascular calcifications. IMPRESSION: Lateral soft tissue swelling, without acute osseous abnormality. Markedly age advanced vascular calcification. Electronically Signed   By: Abigail Miyamoto M.D.   On: 10/19/2020 12:04    Procedures .Ortho Injury Treatment  Date/Time:  10/19/2020 12:54 PM Performed by: Luna Fuse, MD Authorized by: Luna Fuse, MD  Comments: Splint placed to left lower extremity.  Inspected by myself.  Neurovascular intact after placement.      Medications Ordered in ED Medications - No data to display  ED Course  I have reviewed the triage vital signs and the nursing notes.  Pertinent labs & imaging results that were available during my care of the patient were reviewed by me and considered in my medical decision making (see chart for details).    MDM Rules/Calculators/A&P                          X-rays of the ankle show no acute fracture.  Patient placed in a ankle splint.  Neurovascularly intact after placement of splint.  Advised follow-up with her doctors within a week.  Advised me to return for worsening pain or any additional concerns  Final Clinical Impression(s) / ED Diagnoses Final diagnoses:  Sprain of left ankle, unspecified ligament, initial encounter    Rx / DC Orders ED Discharge Orders    None       Luna Fuse, MD 10/19/20 1255

## 2020-10-19 NOTE — ED Triage Notes (Signed)
Pt arrived via walk in, fell off curb yesterday, left ankle swelling and pain.

## 2020-10-19 NOTE — Progress Notes (Signed)
Orthopedic Tech Progress Note Patient Details:  Kimberly [name] 1990-09-19 [id number]  Patient ID: Kimberly [name], female   DOB: Oct 09, 1990, 30 y.o.   MRN: [id number]   Kimberly [name] 10/19/2020, 1:07 PM aircasr applied to left ankle

## 2020-12-03 DIAGNOSIS — D069 Carcinoma in situ of cervix, unspecified: Secondary | ICD-10-CM | POA: Insufficient documentation

## 2020-12-03 DIAGNOSIS — D573 Sickle-cell trait: Secondary | ICD-10-CM | POA: Insufficient documentation

## 2020-12-08 ENCOUNTER — Ambulatory Visit
Admission: RE | Admit: 2020-12-08 | Discharge: 2020-12-08 | Disposition: A | Discharge status: Home / Self Care | Payer: No Typology Code available for payment source | Source: Ambulatory Visit | Attending: Internal Medicine | Admitting: Internal Medicine

## 2020-12-08 ENCOUNTER — Other Ambulatory Visit: Payer: Self-pay | Admitting: Internal Medicine

## 2020-12-08 DIAGNOSIS — N183 Chronic kidney disease, stage 3 unspecified: Secondary | ICD-10-CM

## 2020-12-08 DIAGNOSIS — N179 Acute kidney failure, unspecified: Secondary | ICD-10-CM

## 2020-12-27 DIAGNOSIS — Z9289 Personal history of other medical treatment: Secondary | ICD-10-CM

## 2020-12-27 HISTORY — DX: Personal history of other medical treatment: Z92.89

## 2020-12-31 ENCOUNTER — Ambulatory Visit (HOSPITAL_COMMUNITY)
Admission: RE | Admit: 2020-12-31 | Discharge: 2020-12-31 | Disposition: A | Discharge status: Home / Self Care | Payer: No Typology Code available for payment source | Source: Ambulatory Visit | Attending: Internal Medicine | Admitting: Internal Medicine

## 2020-12-31 ENCOUNTER — Other Ambulatory Visit: Payer: Self-pay

## 2020-12-31 VITALS — BP 122/83 | HR 78 | Temp 97.3°F | Resp 18

## 2020-12-31 DIAGNOSIS — N183 Chronic kidney disease, stage 3 unspecified: Secondary | ICD-10-CM

## 2020-12-31 DIAGNOSIS — D631 Anemia in chronic kidney disease: Secondary | ICD-10-CM | POA: Insufficient documentation

## 2020-12-31 DIAGNOSIS — N184 Chronic kidney disease, stage 4 (severe): Secondary | ICD-10-CM | POA: Insufficient documentation

## 2020-12-31 LAB — RENAL FUNCTION PANEL
Albumin: 2.7 g/dL — ABNORMAL LOW (ref 3.5–5.0)
Anion gap: 11 (ref 5–15)
BUN: 72 mg/dL — ABNORMAL HIGH (ref 6–20)
CO2: 17 mmol/L — ABNORMAL LOW (ref 22–32)
Calcium: 5.5 mg/dL — CL (ref 8.9–10.3)
Chloride: 108 mmol/L (ref 98–111)
Creatinine, Ser: 7.46 mg/dL — ABNORMAL HIGH (ref 0.44–1.00)
GFR, Estimated: 7 mL/min — ABNORMAL LOW (ref >60)
Glucose, Bld: 250 mg/dL — ABNORMAL HIGH (ref 70–99)
Phosphorus: 7.6 mg/dL — ABNORMAL HIGH (ref 2.5–4.6)
Potassium: 5.3 mmol/L — ABNORMAL HIGH (ref 3.5–5.1)
Sodium: 136 mmol/L (ref 135–145)

## 2020-12-31 LAB — POCT HEMOGLOBIN-HEMACUE: Hemoglobin: 7 g/dL — ABNORMAL LOW (ref 12.0–15.0)

## 2020-12-31 MED ORDER — EPOETIN ALFA-EPBX 40000 UNIT/ML IJ SOLN
30000.0000 [IU] | INTRAMUSCULAR | Status: DC
Start: 2020-12-31 — End: 2021-01-01

## 2020-12-31 MED ORDER — EPOETIN ALFA-EPBX 40000 UNIT/ML IJ SOLN
INTRAMUSCULAR | Status: AC
  Administered 2020-12-31: 30000 [IU] via SUBCUTANEOUS
  Filled 2020-12-31: qty 1

## 2020-12-31 NOTE — Progress Notes (Signed)
Pt here today for retacrit injection.  Have not seen patient since [place] 2021.  Hemocue 7, pt reports SOB with walking and lethargy, denies CP, denies blood in stool and or urine.  Spoke with DR Johnney Ou who said to ask patient does she feel she needs to go to the ER now for a transfusion, or can she wait to be seen at our first available appt for an outpatient transfusion on 5/12.  Pt stated she could wait.  appt scheduled for T&C on 5/11 and transfusion on 5/12 of next week.  Pt given appt cards for both appts.  Advised pt if she begins to see any bleeding in her stool or urine, if her shortness of breath and lethergy  begins to get worse to go ahead and seek medical  Care via the ER.  PT verbalized undserstanding.

## 2020-12-31 NOTE — Discharge Instructions (Signed)

## 2021-01-01 LAB — PTH, INTACT AND CALCIUM
Calcium, Total (PTH): 5.6 mg/dL — CL (ref 8.7–10.2)
PTH: 132 pg/mL — ABNORMAL HIGH (ref 15–65)

## 2021-01-02 ENCOUNTER — Emergency Department (HOSPITAL_COMMUNITY): Payer: No Typology Code available for payment source

## 2021-01-02 ENCOUNTER — Other Ambulatory Visit: Payer: Self-pay

## 2021-01-02 ENCOUNTER — Encounter (HOSPITAL_COMMUNITY): Payer: Self-pay

## 2021-01-02 ENCOUNTER — Inpatient Hospital Stay (HOSPITAL_COMMUNITY)
Admission: EM | Admit: 2021-01-02 | Discharge: 2021-01-03 | DRG: 292 | Disposition: A | Discharge status: Home / Self Care | Payer: No Typology Code available for payment source | Attending: Student | Admitting: Student

## 2021-01-02 DIAGNOSIS — D573 Sickle-cell trait: Secondary | ICD-10-CM | POA: Diagnosis present

## 2021-01-02 DIAGNOSIS — N185 Chronic kidney disease, stage 5: Secondary | ICD-10-CM | POA: Diagnosis present

## 2021-01-02 DIAGNOSIS — R0789 Other chest pain: Secondary | ICD-10-CM

## 2021-01-02 DIAGNOSIS — M79601 Pain in right arm: Secondary | ICD-10-CM | POA: Diagnosis not present

## 2021-01-02 DIAGNOSIS — Z9641 Presence of insulin pump (external) (internal): Secondary | ICD-10-CM | POA: Diagnosis present

## 2021-01-02 DIAGNOSIS — Z20822 Contact with and (suspected) exposure to covid-19: Secondary | ICD-10-CM | POA: Diagnosis present

## 2021-01-02 DIAGNOSIS — R079 Chest pain, unspecified: Secondary | ICD-10-CM | POA: Diagnosis present

## 2021-01-02 DIAGNOSIS — E872 Acidosis: Secondary | ICD-10-CM | POA: Diagnosis present

## 2021-01-02 DIAGNOSIS — E875 Hyperkalemia: Secondary | ICD-10-CM | POA: Diagnosis present

## 2021-01-02 DIAGNOSIS — Z8371 Family history of colonic polyps: Secondary | ICD-10-CM

## 2021-01-02 DIAGNOSIS — I132 Hypertensive heart and chronic kidney disease with heart failure and with stage 5 chronic kidney disease, or end stage renal disease: Secondary | ICD-10-CM | POA: Diagnosis present

## 2021-01-02 DIAGNOSIS — N179 Acute kidney failure, unspecified: Secondary | ICD-10-CM | POA: Diagnosis present

## 2021-01-02 DIAGNOSIS — D649 Anemia, unspecified: Secondary | ICD-10-CM | POA: Diagnosis not present

## 2021-01-02 DIAGNOSIS — I5032 Chronic diastolic (congestive) heart failure: Secondary | ICD-10-CM | POA: Diagnosis present

## 2021-01-02 DIAGNOSIS — E109 Type 1 diabetes mellitus without complications: Secondary | ICD-10-CM | POA: Diagnosis not present

## 2021-01-02 DIAGNOSIS — E1065 Type 1 diabetes mellitus with hyperglycemia: Secondary | ICD-10-CM | POA: Diagnosis present

## 2021-01-02 DIAGNOSIS — Z832 Family history of diseases of the blood and blood-forming organs and certain disorders involving the immune mechanism: Secondary | ICD-10-CM

## 2021-01-02 DIAGNOSIS — M7989 Other specified soft tissue disorders: Secondary | ICD-10-CM | POA: Diagnosis not present

## 2021-01-02 DIAGNOSIS — Z79899 Other long term (current) drug therapy: Secondary | ICD-10-CM | POA: Diagnosis not present

## 2021-01-02 DIAGNOSIS — Z794 Long term (current) use of insulin: Secondary | ICD-10-CM

## 2021-01-02 DIAGNOSIS — I10 Essential (primary) hypertension: Secondary | ICD-10-CM | POA: Diagnosis not present

## 2021-01-02 DIAGNOSIS — Z888 Allergy status to other drugs, medicaments and biological substances status: Secondary | ICD-10-CM

## 2021-01-02 DIAGNOSIS — E1022 Type 1 diabetes mellitus with diabetic chronic kidney disease: Secondary | ICD-10-CM | POA: Diagnosis present

## 2021-01-02 DIAGNOSIS — R739 Hyperglycemia, unspecified: Secondary | ICD-10-CM

## 2021-01-02 DIAGNOSIS — D631 Anemia in chronic kidney disease: Secondary | ICD-10-CM | POA: Diagnosis present

## 2021-01-02 DIAGNOSIS — N186 End stage renal disease: Secondary | ICD-10-CM | POA: Diagnosis not present

## 2021-01-02 LAB — IRON AND TIBC
Iron: 68 ug/dL (ref 28–170)
Saturation Ratios: 19 % (ref 10.4–31.8)
TIBC: 358 ug/dL (ref 250–450)
UIBC: 290 ug/dL

## 2021-01-02 LAB — CBG MONITORING, ED
Glucose-Capillary: 473 mg/dL — ABNORMAL HIGH (ref 70–99)
Glucose-Capillary: 537 mg/dL (ref 70–99)
Glucose-Capillary: 536 mg/dL (ref 70–99)

## 2021-01-02 LAB — URINALYSIS, ROUTINE W REFLEX MICROSCOPIC
Bilirubin Urine: NEGATIVE
Glucose, UA: >=500 mg/dL — AB
Ketones, ur: 5 mg/dL — AB
Leukocytes,Ua: NEGATIVE
Nitrite: NEGATIVE
Protein, ur: >=300 mg/dL — AB
Specific Gravity, Urine: 1.009 (ref 1.005–1.030)
pH: 5 (ref 5.0–8.0)

## 2021-01-02 LAB — CBC WITH DIFFERENTIAL/PLATELET
Abs Immature Granulocytes: 0.23 10*3/uL — ABNORMAL HIGH (ref 0.00–0.07)
Basophils Absolute: 0 10*3/uL (ref 0.0–0.1)
Basophils Relative: 0 %
Eosinophils Absolute: 0.4 10*3/uL (ref 0.0–0.5)
Eosinophils Relative: 2 %
HCT: 12 % — ABNORMAL LOW (ref 36.0–46.0)
Hemoglobin: 4 g/dL — CL (ref 12.0–15.0)
Immature Granulocytes: 1 %
Lymphocytes Relative: 9 %
Lymphs Abs: 1.9 10*3/uL (ref 0.7–4.0)
MCH: 26.3 pg (ref 26.0–34.0)
MCHC: 33.3 g/dL (ref 30.0–36.0)
MCV: 78.9 fL — ABNORMAL LOW (ref 80.0–100.0)
Monocytes Absolute: 0.7 10*3/uL (ref 0.1–1.0)
Monocytes Relative: 3 %
Neutro Abs: 18.7 10*3/uL — ABNORMAL HIGH (ref 1.7–7.7)
Neutrophils Relative %: 85 %
Platelets: 296 10*3/uL (ref 150–400)
RBC: 1.52 MIL/uL — ABNORMAL LOW (ref 3.87–5.11)
RDW: 15.1 % (ref 11.5–15.5)
WBC: 22 10*3/uL — ABNORMAL HIGH (ref 4.0–10.5)
nRBC: 0 % (ref 0.0–0.2)

## 2021-01-02 LAB — I-STAT VENOUS BLOOD GAS, ED
Acid-base deficit: 13 mmol/L — ABNORMAL HIGH (ref 0.0–2.0)
Bicarbonate: 13.3 mmol/L — ABNORMAL LOW (ref 20.0–28.0)
Calcium, Ion: 0.75 mmol/L — CL (ref 1.15–1.40)
HCT: 28 % — ABNORMAL LOW (ref 36.0–46.0)
Hemoglobin: 9.5 g/dL — ABNORMAL LOW (ref 12.0–15.0)
O2 Saturation: 55 %
Potassium: 6 mmol/L — ABNORMAL HIGH (ref 3.5–5.1)
Sodium: 133 mmol/L — ABNORMAL LOW (ref 135–145)
TCO2: 14 mmol/L — ABNORMAL LOW (ref 22–32)
pCO2, Ven: 33.1 mmHg — ABNORMAL LOW (ref 44.0–60.0)
pH, Ven: 7.213 — ABNORMAL LOW (ref 7.250–7.430)
pO2, Ven: 34 mmHg (ref 32.0–45.0)

## 2021-01-02 LAB — COMPREHENSIVE METABOLIC PANEL
ALT: 29 U/L (ref 0–44)
AST: 42 U/L — ABNORMAL HIGH (ref 15–41)
Albumin: 3.1 g/dL — ABNORMAL LOW (ref 3.5–5.0)
Alkaline Phosphatase: 128 U/L — ABNORMAL HIGH (ref 38–126)
Anion gap: 15 (ref 5–15)
BUN: 81 mg/dL — ABNORMAL HIGH (ref 6–20)
CO2: 11 mmol/L — ABNORMAL LOW (ref 22–32)
Calcium: 6 mg/dL — CL (ref 8.9–10.3)
Chloride: 105 mmol/L (ref 98–111)
Creatinine, Ser: 7.12 mg/dL — ABNORMAL HIGH (ref 0.44–1.00)
GFR, Estimated: 7 mL/min — ABNORMAL LOW (ref >60)
Glucose, Bld: 459 mg/dL — ABNORMAL HIGH (ref 70–99)
Potassium: 6.4 mmol/L (ref 3.5–5.1)
Sodium: 131 mmol/L — ABNORMAL LOW (ref 135–145)
Total Bilirubin: 1 mg/dL (ref 0.3–1.2)
Total Protein: 6.5 g/dL (ref 6.5–8.1)

## 2021-01-02 LAB — FOLATE: Folate: 26.2 ng/mL (ref >5.9)

## 2021-01-02 LAB — POC OCCULT BLOOD, ED: Fecal Occult Bld: NEGATIVE

## 2021-01-02 LAB — RETICULOCYTES
Immature Retic Fract: 33.5 % — ABNORMAL HIGH (ref 2.3–15.9)
RBC.: 2.14 MIL/uL — ABNORMAL LOW (ref 3.87–5.11)
Retic Count, Absolute: 53.1 10*3/uL (ref 19.0–186.0)
Retic Ct Pct: 2.5 % (ref 0.4–3.1)

## 2021-01-02 LAB — RESP PANEL BY RT-PCR (FLU A&B, COVID) ARPGX2
Influenza A by PCR: NEGATIVE
Influenza B by PCR: NEGATIVE
SARS Coronavirus 2 by RT PCR: NEGATIVE

## 2021-01-02 LAB — I-STAT BETA HCG BLOOD, ED (MC, WL, AP ONLY): I-stat hCG, quantitative: <5 m[IU]/mL (ref <5)

## 2021-01-02 LAB — VITAMIN B12: Vitamin B-12: 288 pg/mL (ref 180–914)

## 2021-01-02 LAB — FERRITIN: Ferritin: 57 ng/mL (ref 11–307)

## 2021-01-02 LAB — PREPARE RBC (CROSSMATCH)

## 2021-01-02 MED ORDER — ONDANSETRON HCL 4 MG/2ML IJ SOLN: 4.0000 mg | Freq: Four times a day (QID) | INTRAMUSCULAR | Status: DC | PRN

## 2021-01-02 MED ORDER — SODIUM ZIRCONIUM CYCLOSILICATE 5 G PO PACK
5.0000 g | PACK | Freq: Once | ORAL | Status: DC
  Filled 2021-01-02: qty 1

## 2021-01-02 MED ORDER — ACETAMINOPHEN 325 MG PO TABS: 650.0000 mg | ORAL_TABLET | ORAL | Status: DC | PRN

## 2021-01-02 MED ORDER — INSULIN GLARGINE 100 UNIT/ML ~~LOC~~ SOLN: 10.0000 [IU] | Freq: Two times a day (BID) | SUBCUTANEOUS | Status: DC

## 2021-01-02 MED ORDER — METOPROLOL TARTRATE 50 MG PO TABS
50.0000 mg | ORAL_TABLET | Freq: Two times a day (BID) | ORAL | Status: DC
  Administered 2021-01-02 – 2021-01-03 (×2): 50 mg via ORAL
  Filled 2021-01-02: qty 1
  Filled 2021-01-02: qty 2

## 2021-01-02 MED ORDER — AMLODIPINE BESYLATE 5 MG PO TABS
5.0000 mg | ORAL_TABLET | Freq: Every day | ORAL | Status: DC
  Administered 2021-01-02 – 2021-01-03 (×2): 5 mg via ORAL
  Filled 2021-01-02 (×2): qty 1

## 2021-01-02 MED ORDER — SODIUM ZIRCONIUM CYCLOSILICATE 10 G PO PACK
10.0000 g | PACK | Freq: Two times a day (BID) | ORAL | Status: DC
  Administered 2021-01-02 – 2021-01-03 (×2): 10 g via ORAL
  Filled 2021-01-02 (×3): qty 1

## 2021-01-02 MED ORDER — INSULIN ASPART 100 UNIT/ML IJ SOLN
10.0000 [IU] | Freq: Once | INTRAMUSCULAR | Status: AC
  Administered 2021-01-02: 10 [IU] via SUBCUTANEOUS

## 2021-01-02 MED ORDER — HYDRALAZINE HCL 25 MG PO TABS
25.0000 mg | ORAL_TABLET | Freq: Three times a day (TID) | ORAL | Status: DC
  Administered 2021-01-02 – 2021-01-03 (×2): 25 mg via ORAL
  Filled 2021-01-02 (×2): qty 1

## 2021-01-02 MED ORDER — INSULIN ASPART 100 UNIT/ML IJ SOLN: 10.0000 [IU] | Freq: Once | INTRAMUSCULAR | Status: DC

## 2021-01-02 MED ORDER — CALCIUM GLUCONATE-NACL 1-0.675 GM/50ML-% IV SOLN
1.0000 g | Freq: Once | INTRAVENOUS | Status: AC
  Administered 2021-01-02: 1000 mg via INTRAVENOUS
  Filled 2021-01-02: qty 50

## 2021-01-02 MED ORDER — SODIUM CHLORIDE 0.9 % IV SOLN: 10.0000 mL/h | Freq: Once | INTRAVENOUS | Status: DC

## 2021-01-02 MED ORDER — HEPARIN SODIUM (PORCINE) 5000 UNIT/ML IJ SOLN
5000.0000 [IU] | Freq: Three times a day (TID) | INTRAMUSCULAR | Status: DC
  Filled 2021-01-02: qty 1

## 2021-01-02 MED ORDER — INSULIN ASPART 100 UNIT/ML IJ SOLN
0.0000 [IU] | Freq: Three times a day (TID) | INTRAMUSCULAR | Status: DC
  Administered 2021-01-03: 3 [IU] via SUBCUTANEOUS

## 2021-01-02 MED ORDER — HYDRALAZINE HCL 20 MG/ML IJ SOLN: 5.0000 mg | Freq: Four times a day (QID) | INTRAMUSCULAR | Status: DC | PRN

## 2021-01-02 MED ORDER — INSULIN GLARGINE 100 UNIT/ML ~~LOC~~ SOLN
10.0000 [IU] | Freq: Every day | SUBCUTANEOUS | Status: DC
  Administered 2021-01-03: 10 [IU] via SUBCUTANEOUS
  Filled 2021-01-02 (×3): qty 0.1

## 2021-01-02 MED ORDER — INSULIN ASPART 100 UNIT/ML IJ SOLN: 3.0000 [IU] | Freq: Three times a day (TID) | INTRAMUSCULAR | Status: DC

## 2021-01-02 MED ORDER — SODIUM CHLORIDE 0.9 % IV BOLUS
500.0000 mL | Freq: Once | INTRAVENOUS | Status: AC
  Administered 2021-01-02: 500 mL via INTRAVENOUS

## 2021-01-02 MED ORDER — INSULIN ASPART 100 UNIT/ML IJ SOLN
0.0000 [IU] | Freq: Every day | INTRAMUSCULAR | Status: DC
  Administered 2021-01-02: 5 [IU] via SUBCUTANEOUS

## 2021-01-02 MED ORDER — CALCIUM CARBONATE ANTACID 500 MG PO CHEW
2.0000 | CHEWABLE_TABLET | Freq: Three times a day (TID) | ORAL | Status: DC
  Administered 2021-01-03 (×2): 400 mg via ORAL
  Filled 2021-01-02 (×2): qty 2

## 2021-01-02 MED ORDER — STERILE WATER FOR INJECTION IV SOLN
INTRAVENOUS | Status: DC
  Filled 2021-01-02 (×2): qty 1000

## 2021-01-02 MED ORDER — PROCHLORPERAZINE EDISYLATE 10 MG/2ML IJ SOLN
10.0000 mg | Freq: Four times a day (QID) | INTRAMUSCULAR | Status: DC | PRN
  Filled 2021-01-02: qty 2

## 2021-01-02 NOTE — H&P (Signed)
History and Physical  DELIANA [name] HYQ:[id number] DOB: 06-02-1991 DOA: 01/02/2021  Referring physician:  Jarrett Soho, [place] PCP: Jacelyn Pi, MD  Outpatient Specialists: Nephrology. Patient coming from: Home  Chief Complaint: Chest pain.  HPI: Kimberly [name] is a 30 y.o. female with medical history significant for essential hypertension, type 1 diabetes, CKD 5 not on hemodialysis, anemia of chronic disease, who presented to [facility] ED due to nonexertional chest pain this morning.  She took Tylenol with no improvement.  Associated with dyspnea which is worse with activity.  Had some nausea with no vomiting.  Located in the center of her chest, pressure like, radiating from her back to her chest, 5/10 at its worst.  Her pain improved since she got to the ED.  Reports orthopnea, use of multiple pillows at night to keep the head of the bed elevated.  She was supposed to undergo blood transfusion 3 days ago however this was not performed due to lack of staff availability.  At that time she received Retacrit injection and the blood transfusion was rescheduled for 01/08/2020.  Hemoglobin on 12/31/20 was 7.0.  Last blood transfusion was in 2021.  No history of GI bleed or frequent NSAID use.  She denies hematemesis, melena or hematochezia.  Last menses was on 12/23/2020 and lasted for 1 week.  No current overt bleeding.  She follows with nephrology outpatient Dr. Johnney Ou.  Also follows with kidney transplant team at [facility].   She presents to the ED for further evaluation.  Work-up in the ED reveals severe anemia with hemoglobin of 4 and hyperkalemia with serum potassium 6.4.  EDP discussed case with nephrology who recommended Lokelma for hyperkalemia.  2U PRBCs ordered to be transfused by EDP.  TRH, hospitalist team, was asked to admit.  ED Course:  Afebrile.  BP 157/92, pulse 90, respiratory 24, O2 saturation 100% on room air.  Lab studies remarkable for serum sodium 131, serum potassium 6.4, serum  bicarb 11, glucose 459, BUN 81, creatinine 7.12, anion gap 15, serum calcium 6.0, albumin 3.1, alkaline phosphatase 128, AST 42, ALT 29, GFR 7.  WBC 22, hemoglobin 4.0, MCV 78.9, platelet 296, neutrophil count 18.7.  Troponin pending.  Review of Systems: Review of systems as noted in the HPI. All other systems reviewed and are negative.   Past Medical History:  Diagnosis Date  . Diabetes mellitus without complication ([place])   . Preeclampsia   . Preeclampsia without severe features   . Sickle cell trait [facility])    Past Surgical History:  Procedure Laterality Date  . ESOPHAGOGASTRODUODENOSCOPY  08/2018   [facility] [place] and a gastric enptying study   . WISDOM TOOTH EXTRACTION      Social History:  reports that she has never smoked. She has never used smokeless tobacco. She reports that she does not drink alcohol and does not use drugs.   Allergies  Allergen Reactions  . Metoclopramide Other (See Comments)    tardive dyskinesia per outside provider note Other reaction(s): Other (See Comments) ta disconsia    Family History  Problem Relation Age of Onset  . Lupus Mother   . Aneurysm Mother   . Colon polyps Maternal Aunt        great aunt  . Colon cancer Neg Hx   . Esophageal cancer Neg Hx       Prior to Admission medications   Medication Sig Start Date End Date Taking? Authorizing Provider  amLODipine (NORVASC) 5 MG tablet Take 5 mg  by mouth daily. 09/23/20  Yes [provider]  calcitRIOL (ROCALTROL) 0.25 MCG capsule Take 0.25 mcg by mouth daily. 12/08/20  Yes [provider]  calcium carbonate (TUMS - DOSED IN MG ELEMENTAL CALCIUM) 500 MG chewable tablet Chew 2 tablets by mouth 3 (three) times daily.   Yes [provider]  chlorthalidone (HYGROTON) 25 MG tablet Take 12.5 mg by mouth daily.   Yes [provider]  cyanocobalamin (,VITAMIN B-12,) 1000 MCG/ML injection Inject 1 mL into the muscle every 30 (thirty) days. 06/03/20  Yes  [provider]  etonogestrel (NEXPLANON) 68 MG IMPL implant Inject 1 each into the skin once.   Yes [provider]  hydrALAZINE (APRESOLINE) 25 MG tablet Take 25 mg by mouth 3 (three) times daily. 09/23/20  Yes [provider]  Insulin Human (INSULIN PUMP) SOLN Inject into the skin. Humalog   Yes [provider]  insulin lispro (HUMALOG) 100 UNIT/ML injection Inject 3-6 Units into the skin 3 (three) times daily before meals. Via insulin pump; INJECT 100 UNITS MAX DAILY DOSEAGE VIA INSULIN PUMP UNDER THE SKIN   Yes [provider]  insulin lispro (HUMALOG) 100 UNIT/ML injection 0-9 Units TID and 0-5 units at bedside as per sliding scale 09/30/20 09/30/21 Yes Pokhrel, Laxman, MD  metoprolol tartrate (LOPRESSOR) 50 MG tablet Take 1 tablet (50 mg total) by mouth 2 (two) times daily. 03/23/18  Yes Patrecia Pour, Christean Grief, MD  sodium bicarbonate 650 MG tablet Take 650 mg by mouth 2 (two) times daily. 07/15/20  Yes [provider]  BAYER CONTOUR NEXT TEST test strip USE AS DIRECTED TO TEST 6 TO 8 TIMES DAILY Patient taking differently: 1 each by Other route. 10/01/14   Woodroe Mode, MD  Blood Glucose Monitoring Suppl (CONTOUR NEXT EZ MONITOR) W/DEVICE KIT 1 each by Does not apply route 6 (six) times daily. Provide lancets for testing six times daily. Type I DM with diabetes on Insulin Pump  DX 648.03 06/17/13   Woodroe Mode, MD  insulin glargine (LANTUS) 100 UNIT/ML injection Inject 0.22 mLs (22 Units total) into the skin daily. Patient not taking: Reported on 01/02/2021 10/01/20 10/31/20  Flora Lipps, MD    Physical Exam: BP (!) 157/92 (BP Location: Right Arm)   Pulse 90   Temp 98 F (36.7 C) (Oral)   Resp (!) 24   Ht 5' 5"  (1.651 m)   Wt 73.9 kg   SpO2 100%   BMI 27.12 kg/m   . General: 30 y.o. year-old female well developed well nourished in no acute distress.  Alert and oriented x3. . Cardiovascular: Regular rate and rhythm with no rubs or  gallops.  No thyromegaly or JVD noted.  No lower extremity edema. 2/4 pulses in all 4 extremities. Marland Kitchen Respiratory: Clear to auscultation with no wheezes or rales. Good inspiratory effort. . Abdomen: Soft nontender nondistended with normal bowel sounds x4 quadrants. . Muskuloskeletal: No cyanosis, clubbing or edema noted bilaterally . Neuro: CN II-XII intact, strength, sensation, reflexes . Skin: No ulcerative lesions noted or rashes . Psychiatry: Judgement and insight appear normal. Mood is appropriate for condition and setting          Labs on Admission:  Basic Metabolic Panel: Recent Labs  Lab 12/31/20 1341 01/02/21 1801 01/02/21 1808  NA 136 131* 133*  K 5.3* 6.4* 6.0*  CL 108 105  --   CO2 17* 11*  --   GLUCOSE 250* 459*  --   BUN 72* 81*  --  CREATININE 7.46* 7.12*  --   CALCIUM 5.5*  5.6* 6.0*  --   PHOS 7.6*  --   --    Liver Function Tests: Recent Labs  Lab 12/31/20 1341 01/02/21 1801  AST  --  42*  ALT  --  29  ALKPHOS  --  128*  BILITOT  --  1.0  PROT  --  6.5  ALBUMIN 2.7* 3.1*   No results for input(s): LIPASE, AMYLASE in the last 168 hours. No results for input(s): AMMONIA in the last 168 hours. CBC: Recent Labs  Lab 12/31/20 1348 01/02/21 1801 01/02/21 1808  WBC  --  22.0*  --   NEUTROABS  --  18.7*  --   HGB 7.0* 4.0* 9.5*  HCT  --  12.0* 28.0*  MCV  --  78.9*  --   PLT  --  296  --    Cardiac Enzymes: No results for input(s): CKTOTAL, CKMB, CKMBINDEX, TROPONINI in the last 168 hours.  BNP (last 3 results) No results for input(s): BNP in the last 8760 hours.  ProBNP (last 3 results) No results for input(s): PROBNP in the last 8760 hours.  CBG: Recent Labs  Lab 01/02/21 1823  GLUCAP 473*    Radiological Exams on Admission: DG Chest Portable 1 View  Result Date: 01/02/2021 CLINICAL DATA:  Chest pain and nausea EXAM: PORTABLE CHEST 1 VIEW COMPARISON:  03/19/2018 FINDINGS: The heart size and mediastinal contours are within normal  limits. Both lungs are clear. The visualized skeletal structures are unremarkable. IMPRESSION: No active disease. Electronically Signed   By: Inez Catalina M.D.   On: 01/02/2021 16:58    EKG: I independently viewed the EKG done and my findings are as followed: Sinus rhythm rate of 89, left atrial enlargement, nonspecific ST-T changes.  QTc 526.  Assessment/Plan Present on Admission: . Chest pain  Active Problems:   Chest pain  Chest pain, rule out ACS. Suspect contributed by severe anemia and hyperkalemia. Troponin pending. Chest x-ray nonacute. No acute ischemia on twelve-lead EKG Presented with hemoglobin of 4 and serum potassium of 6.4. 2 units PRBC ordered to be transfused by EDP  Continue Lokelma 10 g twice daily as recommended by nephrology, per EDP Obtain 2D echo, fasting lipid panel. Closely monitor on telemetry for any acute changes.  Hyperkalemia in the setting of worsening CKD 5 Presented with serum potassium 6.4 IV calcium gluconate 1 g x 1 Lokelma 10 g twice daily Repeat serum potassium  Severe symptomatic anemia in the setting of anemia of chronic disease from advanced CKD Presented with hemoglobin of 4 No overt bleeding Initially scheduled for outpatient transfusion however was rescheduled. Recent Retacrit injection 2 units PRBC ordered by EDP Repeat H&H posttransfusion. No evidence of significant iron deficiency on iron studies Continue to monitor H&H Repeat CBC in the morning  Severe anion gap metabolic acidosis likely multifactorial secondary to worsening renal failure versus DKA type I? Presented with serum bicarb of 11, anion gap 15, serum glucose 459 Venous blood gas with pH of 7.2 Obtain beta hydroxybutyrate acid level Obtain stat repeat CBG Treat underlying conditions Start isotonic bicarb drip Repeat BMP every 4 hours x 3 Start insulin drip if no improvement of metabolic acidosis with isotonic bicarb drip Follow-up beta hydroxybutyrate acid  level.  CKD 5 follows with nephrology outpatient Presented with creatinine of 7.4 with GFR 7 Nephrology will see in consultation Continue to avoid nephrotoxic agents Closely monitor urine output Continue gentle IV fluid hydration Repeat  renal panel in the morning Renal carb modified diet.  Chronic hypocalcemia Calcium corrected for albumin 6.7 IV calcium gluconate 1 g x 1 ordered Resume home p.o. calcium supplement. Recheck in the morning  QTC prolongation QTC 526 on admission twelve-lead EKG Avoid QTC prolonging agents Obtain magnesium level Repeat twelve-lead EKG in the morning  Type 1 diabetes with hyperglycemia Follow beta hydroxybutyrate acid to ensure the patient is not in DKA States her nausea has improved No vomiting, she wants to eat Started renal carb modified diet BMP every 4 hours on 3 occurrences, if metabolic acidosis worsens while on isotonic bicarb will make n.p.o. and start insulin drip. Lantus 10 units nightly, NovoLog 3 units 3 times daily and insulin sliding scale for now Monitor CBGs  Essential hypertension BP is not at goal Resume home oral antihypertensive medications Hold off chlorthalidone Add IV antihypertensive as needed with parameters    DVT prophylaxis: SCDs, pharmacological DVT prophylaxis on hold due to ongoing blood transfusion.  Code Status: Full code.  Family Communication: None at bedside.  Disposition Plan: Admit to progressive unit.  Consults called: Nephrology consulted by EDP.  Admission status: Inpatient status.  Patient will require at least 2 midnights for further evaluation and treatment of present condition.   Status is: Inpatient status.   Dispo:  Patient From: Home  Planned Disposition: Home possibly on 01/04/2021 or when nephrology signs off.  Medically stable for discharge: No      Kayleen Memos MD [facility] Pager [phone]  If 7PM-7AM, please contact night-coverage [email or web address] Password  [facility]  01/02/2021, 8:18 PM

## 2021-01-02 NOTE — ED Provider Notes (Signed)
[facility] EMERGENCY DEPARTMENT Provider Note   CSN: [id number] Arrival date & time: 01/02/21  1633     History Chief Complaint  Patient presents with  . Chest Pain    Pt reports having chest pain that started this morning. Accompanied with nausea and dry heaving.     Kimberly [name] is a 30 y.o. female with complex past medical history including stage V CKD, anemia, type 1 diabetes insulin-dependent, gastroparesis, sickle cell trait.  HPI Patient presents to the emergency department today with chief complaint of chest pain and nausea x1 day.  Patient states chest pain started this morning at 10 AM.  Is located in the right upper chest and feels like a throbbing sensation.  Pain has been intermittent.  She rates the pain currently 6 out of 10 in severity.  She is also endorsing lower back pain that is an aching sensation.  She has history of similar pain in the past when she had pyelonephritis.  Patient states she was supposed to undergo a blood transfusion x3 days ago however it was unfortunately unable to be performed as there was no enough staff available.  Patient states she received a Retacrit injection and the transfusion rescheduled for 01/08/20. Hemoglobin x 2 days ago was 7. She has history of 1 transfusion in the past in December 2021. Patient states she's been told her anemia is related to her kidneys. No history of GI blood or frequent NSAID use. Patient reports she was told she developed chest pain with shortness of breath or nausea she should come to the ER for evaluation.  She denies any shortness of breath currently but does admit to feeling nauseous.  That has been constant since chest pain started.  Patient reports her blood sugars have been "all over the place" ranging from 50 to over 600.  Patient states she took Tylenol prior to arrival.  Denies any family history of cardiac disease.  Denies any URI, chills, cough, diaphoresis, syncope, palpitations,  hematemesis or hematuria, dysuria, urinary frequency, diarrhea, blood in stool, black tarry stool. LMP started 4/27, lasted 1 week and she denies heavy bleeding.  Patient also tells me that she is in the process of getting ready to start dialysis.  Her nephrologist is Dr. Johnney Ou. Does not have fistula. She has also been seen by transplant team at [facility] with consideration of kidney pancreas transplant.      Past Medical History:  Diagnosis Date  . Diabetes mellitus without complication ([place])   . Preeclampsia   . Preeclampsia without severe features   . Sickle cell trait [facility])     Patient Active Problem List   Diagnosis Date Noted  . Diabetic ketoacidosis associated with type 1 diabetes mellitus ([place]) 09/29/2020  . CKD (chronic kidney disease) stage 4, GFR 15-29 ml/min (H[facility]) 08/07/2020  . Anemia in chronic kidney disease 08/07/2020  . Overweight (BMI 25.0-29.9) 11/30/2019  . Acute lower UTI 11/29/2019  . Acute renal failure superimposed on stage 4 chronic kidney disease ([place]) 11/29/2019  . Hypertension associated with diabetes ([place]) 11/29/2019  . Hypokalemia 09/06/2018  . Hypertensive urgency 09/06/2018  . Leukocytosis 09/06/2018  . GERD (gastroesophageal reflux disease) 09/06/2018  . Gastroparesis   . Intractable nausea and vomiting 08/29/2018  . Microcytic anemia 08/29/2018  . CIN III (cervical intraepithelial neoplasia grade III) with severe dysplasia 09/09/2017  . DKA, type 1 ([place]) 03/17/2015  . Diabetic nephropathy associated with type 1 diabetes mellitus ([place]) 03/17/2015  . Type 1  diabetes mellitus with diabetic nephropathy ([place]) 05/25/2013    Past Surgical History:  Procedure Laterality Date  . ESOPHAGOGASTRODUODENOSCOPY  08/2018   [facility] and a gastric enptying study   . WISDOM TOOTH EXTRACTION       OB History    Gravida  1   Para  1   Term      Preterm  1   AB      Living  1     SAB      IAB      Ectopic      Multiple       Live Births  1           Family History  Problem Relation Age of Onset  . Lupus Mother   . Aneurysm Mother   . Colon polyps Maternal Aunt        great aunt  . Colon cancer Neg Hx   . Esophageal cancer Neg Hx     Social History   Tobacco Use  . Smoking status: Never Smoker  . Smokeless tobacco: Never Used  Vaping Use  . Vaping Use: Never used  Substance Use Topics  . Alcohol use: No  . Drug use: No    Home Medications Prior to Admission medications   Medication Sig Start Date End Date Taking? Authorizing Provider  amLODipine (NORVASC) 5 MG tablet Take 5 mg by mouth daily. 09/23/20  Yes [provider]  calcitRIOL (ROCALTROL) 0.25 MCG capsule Take 0.25 mcg by mouth daily. 12/08/20  Yes [provider]  calcium carbonate (TUMS - DOSED IN MG ELEMENTAL CALCIUM) 500 MG chewable tablet Chew 2 tablets by mouth 3 (three) times daily.   Yes [provider]  chlorthalidone (HYGROTON) 25 MG tablet Take 12.5 mg by mouth daily.   Yes [provider]  cyanocobalamin (,VITAMIN B-12,) 1000 MCG/ML injection Inject 1 mL into the muscle every 30 (thirty) days. 06/03/20  Yes [provider]  etonogestrel (NEXPLANON) 68 MG IMPL implant Inject 1 each into the skin once.   Yes [provider]  hydrALAZINE (APRESOLINE) 25 MG tablet Take 25 mg by mouth 3 (three) times daily. 09/23/20  Yes [provider]  Insulin Human (INSULIN PUMP) SOLN Inject into the skin. Humalog   Yes [provider]  insulin lispro (HUMALOG) 100 UNIT/ML injection Inject 3-6 Units into the skin 3 (three) times daily before meals. Via insulin pump; INJECT 100 UNITS MAX DAILY DOSEAGE VIA INSULIN PUMP UNDER THE SKIN   Yes [provider]  insulin lispro (HUMALOG) 100 UNIT/ML injection 0-9 Units TID and 0-5 units at bedside as per sliding scale 09/30/20 09/30/21 Yes Pokhrel, Laxman, MD  metoprolol tartrate (LOPRESSOR) 50 MG tablet Take 1 tablet (50 mg total)  by mouth 2 (two) times daily. 03/23/18  Yes Patrecia Pour, Christean Grief, MD  sodium bicarbonate 650 MG tablet Take 650 mg by mouth 2 (two) times daily. 07/15/20  Yes [provider]  BAYER CONTOUR NEXT TEST test strip USE AS DIRECTED TO TEST 6 TO 8 TIMES DAILY Patient taking differently: 1 each by Other route. 10/01/14   Woodroe Mode, MD  Blood Glucose Monitoring Suppl (CONTOUR NEXT EZ MONITOR) W/DEVICE KIT 1 each by Does not apply route 6 (six) times daily. Provide lancets for testing six times daily. Type I DM with diabetes on Insulin Pump  DX 648.03 06/17/13   Woodroe Mode, MD  insulin glargine (LANTUS) 100 UNIT/ML injection Inject 0.22 mLs (22  Units total) into the skin daily. Patient not taking: Reported on 01/02/2021 10/01/20 10/31/20  Flora Lipps, MD    Allergies    Metoclopramide  Review of Systems   Review of Systems All other systems are reviewed and are negative for acute change except as noted in the HPI.  Physical Exam Updated Vital Signs BP (!) 183/94 (BP Location: Right Arm)   Pulse 88   Temp 98.1 F (36.7 C) (Oral)   Resp 16   SpO2 100%   Physical Exam Vitals and nursing note reviewed.  Constitutional:      General: She is not in acute distress.    Appearance: She is not ill-appearing.  HENT:     Head: Normocephalic and atraumatic.     Right Ear: External ear normal.     Left Ear: External ear normal.     Nose: Nose normal.     Mouth/Throat:     Mouth: Mucous membranes are moist.     Pharynx: Oropharynx is clear.  Eyes:     General: No scleral icterus.       Right eye: No discharge.        Left eye: No discharge.     Extraocular Movements: Extraocular movements intact.     Conjunctiva/sclera: Conjunctivae normal.     Pupils: Pupils are equal, round, and reactive to light.  Neck:     Vascular: No JVD.  Cardiovascular:     Rate and Rhythm: Normal rate and regular rhythm.     Pulses: Normal pulses.          Radial pulses are 2+ on the right side and 2+  on the left side.     Heart sounds: Normal heart sounds.  Pulmonary:     Comments: Lungs clear to auscultation in all fields. Symmetric chest rise. No wheezing, rales, or rhonchi. Chest:     Chest wall: No tenderness.  Abdominal:     Tenderness: There is right CVA tenderness and left CVA tenderness.     Comments: Abdomen is soft, non-distended, and non-tender in all quadrants. No rigidity, no guarding. No peritoneal signs.  Genitourinary:    Comments: Chaperone present for exam. Digital Rectal Exam reveals sphincter with good tone. No external hemorrhoids. No masses or fissures. Stool color is brown with no overt blood. No gross melena.  Musculoskeletal:        General: Normal range of motion.     Cervical back: Normal range of motion.     Right lower leg: No edema.     Left lower leg: No edema.  Skin:    General: Skin is warm and dry.     Capillary Refill: Capillary refill takes less than 2 seconds.     Coloration: Skin is pale.  Neurological:     Mental Status: She is oriented to person, place, and time.     GCS: GCS eye subscore is 4. GCS verbal subscore is 5. GCS motor subscore is 6.     Comments: Fluent speech, no facial droop.  Psychiatric:        Behavior: Behavior normal.     ED Results / Procedures / Treatments   Labs (all labs ordered are listed, but only abnormal results are displayed) Labs Reviewed  COMPREHENSIVE METABOLIC PANEL - Abnormal; Notable for the following components:      Result Value   Sodium 131 (*)    Potassium 6.4 (*)    CO2 11 (*)    Glucose, Bld 459 (*)  BUN 81 (*)    Creatinine, Ser 7.12 (*)    Calcium 6.0 (*)    Albumin 3.1 (*)    AST 42 (*)    Alkaline Phosphatase 128 (*)    GFR, Estimated 7 (*)    All other components within normal limits  CBC WITH DIFFERENTIAL/PLATELET - Abnormal; Notable for the following components:   WBC 22.0 (*)    RBC 1.52 (*)    Hemoglobin 4.0 (*)    HCT 12.0 (*)    MCV 78.9 (*)    Neutro Abs 18.7 (*)     Abs Immature Granulocytes 0.23 (*)    All other components within normal limits  URINALYSIS, ROUTINE W REFLEX MICROSCOPIC - Abnormal; Notable for the following components:   Color, Urine STRAW (*)    APPearance HAZY (*)    Glucose, UA >=500 (*)    Hgb urine dipstick SMALL (*)    Ketones, ur 5 (*)    Protein, ur >=300 (*)    Bacteria, UA RARE (*)    All other components within normal limits  RETICULOCYTES - Abnormal; Notable for the following components:   RBC. 2.14 (*)    Immature Retic Fract 33.5 (*)    All other components within normal limits  I-STAT VENOUS BLOOD GAS, ED - Abnormal; Notable for the following components:   pH, Ven 7.213 (*)    pCO2, Ven 33.1 (*)    Bicarbonate 13.3 (*)    TCO2 14 (*)    Acid-base deficit 13.0 (*)    Sodium 133 (*)    Potassium 6.0 (*)    Calcium, Ion 0.75 (*)    HCT 28.0 (*)    Hemoglobin 9.5 (*)    All other components within normal limits  CBG MONITORING, ED - Abnormal; Notable for the following components:   Glucose-Capillary 473 (*)    All other components within normal limits  RESP PANEL BY RT-PCR (FLU A&B, COVID) ARPGX2  VITAMIN B12  IRON AND TIBC  FERRITIN  FOLATE  I-STAT BETA HCG BLOOD, ED ([facility], [facility], [facility] ONLY)  POC OCCULT BLOOD, ED  TYPE AND SCREEN  PREPARE RBC (CROSSMATCH)    EKG EKG Interpretation  Date/Time:  Saturday Jan 02 2021 16:45:28 EDT Ventricular Rate:  89 PR Interval:  141 QRS Duration: 80 QT Interval:  432 QTC Calculation: 526 R Axis:   50 Text Interpretation: Sinus rhythm Left atrial enlargement Minimal ST depression, diffuse leads Prolonged QT interval Baseline wander Since last tracing QT has lengthened Otherwise no significant change Confirmed by Daleen Bo [phone]) on 01/02/2021 4:55:53 PM   Radiology DG Chest Portable 1 View  Result Date: 01/02/2021 CLINICAL DATA:  Chest pain and nausea EXAM: PORTABLE CHEST 1 VIEW COMPARISON:  03/19/2018 FINDINGS: The heart size and mediastinal contours are within  normal limits. Both lungs are clear. The visualized skeletal structures are unremarkable. IMPRESSION: No active disease. Electronically Signed   By: Inez Catalina M.D.   On: 01/02/2021 16:58    Procedures .Critical Care E&M Performed by: Barrie Folk, PA-C  Critical care provider statement:    Critical care time (minutes):  40   Critical care time was exclusive of:  Separately billable procedures and treating other patients and teaching time   Critical care was time spent personally by me on the following activities:  Development of treatment plan with patient or surrogate, discussions with consultants, evaluation of patient's response to treatment, examination of patient, obtaining history from patient or surrogate, ordering and  performing treatments and interventions, ordering and review of laboratory studies, ordering and review of radiographic studies, pulse oximetry, re-evaluation of patient's condition and review of old charts   Care discussed with: admitting provider   After initial E/M assessment, critical care services were subsequently performed that were exclusive of separately billable procedures or treatment.       Medications Ordered in ED Medications  0.9 %  sodium chloride infusion (has no administration in time range)  insulin aspart (novoLOG) injection 10 Units (has no administration in time range)  sodium chloride 0.9 % bolus 500 mL (has no administration in time range)  sodium zirconium cyclosilicate (LOKELMA) packet 10 g (has no administration in time range)    ED Course  I have reviewed the triage vital signs and the nursing notes.  Pertinent labs & imaging results that were available during my care of the patient were reviewed by me and considered in my medical decision making (see chart for details).    MDM Rules/Calculators/A&P                          History provided by patient with additional history obtained from chart review.    Presenting  with chest pain and nausea. Afebrile, HDS on ED arrival. Non toxic appearing. Chest pain not reproducible on exam, has bilateral CVA tenderness. Patient on cardiac monitor. EKG shows prolonged QT which is longer compared to prior EKGs. No STEMI. . Will avoid any QT prolonging medications. I viewed pt's chest xray and it does not suggest acute infectious processes. Agree with radiologist impression.  Glucose on arrival is 473. Anemia panel collected. CBC with leukocytosis 22, hemoglobin 4, normal platelets. Type and screen resulted. 2 units of blood ordered.CMP with multiple abnormalities including hyponatremia 131, hyperkalemia 6.4 with slight hemolysis, bicarb 11, BUN/Cr 81/7.12, GFR 7, anion gap high end of normal at 15. UA without signs of infection, has 5 ketones, no RBC. Covid test is negative. VBG with acidosis 7.2. Pregnancy test is negative. Fecal occult negative, no gross melena on exam.  10 units of subcutaneus insulin ordered. Troponin incidentally not ordered with initial labs. Will be added to work up. Discussed case with ED attending Dr. Eulis Foster who agrees with plan to admit. Consulted on call nephrologist Dr. Jonnie Finner who recommends hyperkalemia treatment, lokelma 10 mg BID, renal diet, and nephrology will see patient in consult tomorrow morning. Spoke with Dr. Nevada Crane with hospitalist service who agrees to assume care of patient and bring into the hospital for further evaluation and management.      Portions of this note were generated with Lobby[employer]. Dictation errors may occur despite best attempts at proofreading.    Final Clinical Impression(s) / ED Diagnoses Final diagnoses:  Anemia, unspecified type  Hyperglycemia  AKI (acute kidney injury) ([place])  Atypical chest pain    Rx / DC Orders ED Discharge Orders    None       Lewanda Rife 01/02/21 2356    Daleen Bo, MD 01/03/21 [phone]

## 2021-01-02 NOTE — ED Triage Notes (Addendum)
Pt reports having missed blood transfusion due to staffing issues at transfusion clinic. Pt has hx of ESRD. Patient stated that symptoms started last night and believes that symptoms are related to missing transfusion. Pt informed by primary nephrologist to seek emergency care, in the event symptoms: chest pain, persistent pain and nausea. Patient A&Ox4, VSS, and skin pale.

## 2021-01-03 ENCOUNTER — Inpatient Hospital Stay (HOSPITAL_COMMUNITY): Payer: No Typology Code available for payment source

## 2021-01-03 ENCOUNTER — Encounter (HOSPITAL_COMMUNITY): Payer: Self-pay | Admitting: Internal Medicine

## 2021-01-03 DIAGNOSIS — R079 Chest pain, unspecified: Secondary | ICD-10-CM

## 2021-01-03 DIAGNOSIS — Z9641 Presence of insulin pump (external) (internal): Secondary | ICD-10-CM

## 2021-01-03 DIAGNOSIS — D631 Anemia in chronic kidney disease: Secondary | ICD-10-CM

## 2021-01-03 DIAGNOSIS — N185 Chronic kidney disease, stage 5: Secondary | ICD-10-CM

## 2021-01-03 DIAGNOSIS — D649 Anemia, unspecified: Secondary | ICD-10-CM

## 2021-01-03 DIAGNOSIS — E109 Type 1 diabetes mellitus without complications: Secondary | ICD-10-CM

## 2021-01-03 DIAGNOSIS — E875 Hyperkalemia: Secondary | ICD-10-CM

## 2021-01-03 DIAGNOSIS — I5032 Chronic diastolic (congestive) heart failure: Secondary | ICD-10-CM

## 2021-01-03 DIAGNOSIS — I10 Essential (primary) hypertension: Secondary | ICD-10-CM

## 2021-01-03 LAB — BASIC METABOLIC PANEL
Anion gap: 13 (ref 5–15)
BUN: 79 mg/dL — ABNORMAL HIGH (ref 6–20)
CO2: 17 mmol/L — ABNORMAL LOW (ref 22–32)
Calcium: 6.4 mg/dL — CL (ref 8.9–10.3)
Chloride: 108 mmol/L (ref 98–111)
Creatinine, Ser: 6.74 mg/dL — ABNORMAL HIGH (ref 0.44–1.00)
GFR, Estimated: 8 mL/min — ABNORMAL LOW (ref >60)
Glucose, Bld: 159 mg/dL — ABNORMAL HIGH (ref 70–99)
Potassium: 4.2 mmol/L (ref 3.5–5.1)
Sodium: 138 mmol/L (ref 135–145)

## 2021-01-03 LAB — LIPID PANEL
Cholesterol: 132 mg/dL (ref 0–200)
HDL: 64 mg/dL (ref >40)
LDL Cholesterol: 54 mg/dL (ref 0–99)
Total CHOL/HDL Ratio: 2.1 RATIO
Triglycerides: 68 mg/dL (ref <150)
VLDL: 14 mg/dL (ref 0–40)

## 2021-01-03 LAB — RENAL FUNCTION PANEL
Albumin: 2.9 g/dL — ABNORMAL LOW (ref 3.5–5.0)
Anion gap: 12 (ref 5–15)
BUN: 77 mg/dL — ABNORMAL HIGH (ref 6–20)
CO2: 18 mmol/L — ABNORMAL LOW (ref 22–32)
Calcium: 6.4 mg/dL — CL (ref 8.9–10.3)
Chloride: 107 mmol/L (ref 98–111)
Creatinine, Ser: 6.77 mg/dL — ABNORMAL HIGH (ref 0.44–1.00)
GFR, Estimated: 8 mL/min — ABNORMAL LOW (ref >60)
Glucose, Bld: 160 mg/dL — ABNORMAL HIGH (ref 70–99)
Phosphorus: 6.2 mg/dL — ABNORMAL HIGH (ref 2.5–4.6)
Potassium: 4.2 mmol/L (ref 3.5–5.1)
Sodium: 137 mmol/L (ref 135–145)

## 2021-01-03 LAB — HEMOGLOBIN A1C
Hgb A1c MFr Bld: 7.4 % — ABNORMAL HIGH (ref 4.8–5.6)
Mean Plasma Glucose: 165.68 mg/dL

## 2021-01-03 LAB — GLUCOSE, CAPILLARY
Glucose-Capillary: 317 mg/dL — ABNORMAL HIGH (ref 70–99)
Glucose-Capillary: 29 mg/dL — CL (ref 70–99)
Glucose-Capillary: 90 mg/dL (ref 70–99)
Glucose-Capillary: 105 mg/dL — ABNORMAL HIGH (ref 70–99)
Glucose-Capillary: 139 mg/dL — ABNORMAL HIGH (ref 70–99)
Glucose-Capillary: 184 mg/dL — ABNORMAL HIGH (ref 70–99)
Glucose-Capillary: 154 mg/dL — ABNORMAL HIGH (ref 70–99)
Glucose-Capillary: 72 mg/dL (ref 70–99)

## 2021-01-03 LAB — ECHOCARDIOGRAM COMPLETE
AR max vel: 1.74 cm2
AV Area VTI: 1.84 cm2
AV Area mean vel: 1.92 cm2
AV Mean grad: 3 mmHg
AV Peak grad: 4.8 mmHg
Ao pk vel: 1.1 m/s
Area-P 1/2: 4.49 cm2
Calc EF: 45.4 %
Height: 65 in
MV M vel: 6.11 m/s
MV Peak grad: 149.1 mmHg
MV VTI: 1.24 cm2
Radius: 0.4 cm
S' Lateral: 3 cm
Single Plane A2C EF: 42.5 %
Single Plane A4C EF: 49.7 %
Weight: 2525.59 oz

## 2021-01-03 LAB — CBC
HCT: 29 % — ABNORMAL LOW (ref 36.0–46.0)
Hemoglobin: 10.1 g/dL — ABNORMAL LOW (ref 12.0–15.0)
MCH: 26.8 pg (ref 26.0–34.0)
MCHC: 34.8 g/dL (ref 30.0–36.0)
MCV: 76.9 fL — ABNORMAL LOW (ref 80.0–100.0)
Platelets: 233 10*3/uL (ref 150–400)
RBC: 3.77 MIL/uL — ABNORMAL LOW (ref 3.87–5.11)
RDW: 15 % (ref 11.5–15.5)
WBC: 12.5 10*3/uL — ABNORMAL HIGH (ref 4.0–10.5)
nRBC: 0 % (ref 0.0–0.2)

## 2021-01-03 LAB — BETA-HYDROXYBUTYRIC ACID: Beta-Hydroxybutyric Acid: 0.16 mmol/L (ref 0.05–0.27)

## 2021-01-03 LAB — TROPONIN I (HIGH SENSITIVITY)
Troponin I (High Sensitivity): 8 ng/L (ref <18)
Troponin I (High Sensitivity): 8 ng/L (ref <18)

## 2021-01-03 LAB — MAGNESIUM: Magnesium: 2 mg/dL (ref 1.7–2.4)

## 2021-01-03 LAB — HIV ANTIBODY (ROUTINE TESTING W REFLEX): HIV Screen 4th Generation wRfx: NONREACTIVE

## 2021-01-03 LAB — MRSA PCR SCREENING: MRSA by PCR: NEGATIVE

## 2021-01-03 MED ORDER — INSULIN ASPART 100 UNIT/ML IJ SOLN
7.0000 [IU] | Freq: Once | INTRAMUSCULAR | Status: AC
  Administered 2021-01-03: 7 [IU] via SUBCUTANEOUS

## 2021-01-03 MED ORDER — FUROSEMIDE 40 MG PO TABS: 40.0000 mg | ORAL_TABLET | Freq: Every day | ORAL | 1 refills | Status: DC

## 2021-01-03 MED ORDER — DEXTROSE 50 % IV SOLN: 25.0000 mL | Freq: Once | INTRAVENOUS | Status: AC

## 2021-01-03 MED ORDER — CALCIUM GLUCONATE-NACL 2-0.675 GM/100ML-% IV SOLN
2.0000 g | Freq: Once | INTRAVENOUS | Status: AC
  Administered 2021-01-03: 2000 mg via INTRAVENOUS
  Filled 2021-01-03: qty 100

## 2021-01-03 MED ORDER — DEXTROSE 50 % IV SOLN
INTRAVENOUS | Status: AC
  Administered 2021-01-03: 25 mL
  Filled 2021-01-03: qty 50

## 2021-01-03 NOTE — Progress Notes (Addendum)
Inpatient Diabetes Program Recommendations  AACE/ADA: New Consensus Statement on Inpatient Glycemic Control   Target Ranges:  Prepandial:   less than 140 mg/dL      Peak postprandial:   less than 180 mg/dL (1-2 hours)      Critically ill patients:  140 - 180 mg/dL  Results for Kimberly [name], Kimberly [name] (MRN [id number]) as of 01/03/2021 12:13  Ref. Range 01/02/2021 18:23 01/02/2021 21:16 01/02/2021 23:02 01/03/2021 01:07 01/03/2021 04:05 01/03/2021 04:21 01/03/2021 05:29 01/03/2021 06:34 01/03/2021 07:46 01/03/2021 09:35 01/03/2021 11:59  Glucose-Capillary Latest Ref Range: 70 - 99 mg/dL 473 (H) 537 (HH)  Novolog 10 units'@21'$ :43 536 (HH)  Novolog 5 units'@23'$ :26  Lantus 10 units@ 00:40 317 (H)  Novolog 7 units '@1'$ :52 29 (LL) 90 105 (H) 139 (H) 184 (H)  Novolog 3 units 154 (H) 72    Review of Glycemic Control  Diabetes history: DM1; makes NO insulin, requires basal, correction, and carb coverage insulin Outpatient Diabetes medications: Medtronic insulin pump with Humalog Current orders for Inpatient glycemic control: Novolog 0-15 units TID with meals, Novolog 0-5 units QHS  Inpatient Diabetes Program Recommendations:    Insulin: Please consider ordering Lantus 12 units QHS, decrease Novolog correction to 0-9 units TID with meals, and order Novolog 3 units TID with meals for meal coverage if patient eats at least 50% of meals.  NOTE: Noted consult for Diabetes Coordinator. Diabetes Coordinator is not on campus over the weekend but available by pager from 8am to 5pm for questions or concerns. Chart reviewed. Noted patient was recently inpatient 09/29/20-09/30/20 and was seen by inpatient diabetes coordinator on 09/29/20. Per note on 09/29/20 by Marni Griffon, RD, Diabetes Coordinator patient is followed by Dr. Chalmers Cater (Endocrinologist) and pump settings were noted to be:   Basal rates 0.625 units/ hr 1 unit/10 grams of CHO 1 unit drops blood sugars approximately 75 mg/dL- Goal =150 mg/dL  Patient admitted 01/02/21 with chest pain,  hyperkalemia, severe anemia, severe anion gap metabolic acidosis, hypocalcemia, and hyperglycemia. Initial lab glucose 459 mg/dl on 01/02/21. Patient received Lantus 10 units at 00:40 on 01/03/21. Noted hypoglycemia which was likely due to Novolog dosages and the fact that Novolog was given so closely together.    Thanks, Barnie Alderman, RN, MSN, CDE Diabetes Coordinator Inpatient Diabetes Program [phone] (Team Pager from 8am to 5pm)

## 2021-01-03 NOTE — Plan of Care (Signed)
  Problem: Education: Goal: Knowledge of General Education information will improve Description: Including pain rating scale, medication(s)/side effects and non-pharmacologic comfort measures Outcome: Completed/Met   Problem: Activity: Goal: Risk for activity intolerance will decrease Outcome: Completed/Met   Problem: Pain Managment: Goal: General experience of comfort will improve Outcome: Completed/Met   Problem: Safety: Goal: Ability to remain free from injury will improve Outcome: Completed/Met   Problem: Skin Integrity: Goal: Risk for impaired skin integrity will decrease Outcome: Completed/Met

## 2021-01-03 NOTE — Consult Note (Signed)
Renal Service Consult Note [facility]  Kimberly [name] 01/03/2021 Sol Blazing, MD Requesting Physician: Dr. Cyndia Skeeters  Reason for Consult: Renal failure HPI: The patient is a 30 y.o. year-old w/ hx of IDDM type 1, CKD stage 5, hx of preeclampsia w/o severe features, hx sickle cell trait presented to ED due to feeling weak and fatigued w/ DOE, chest pain and nausea. She was supposed to get prbc transfusion in OP setting this week but due to staffing it didn't happen. In ED Hb was 4.0 (it was 7.9 on 12/31/20 two days prior). Pt denied any bleeding or change in stools. Creat was 7 (recent baseline around 5). +having her menses, last was 4/27. Last tranfusion was in 2021. K+ was up at 6.4. Pt was admitted.  Asked to see for renal failure and hyperkalemia.   Pt got usual ED meds for hyperkalemia and also lokelma and 2u prbc's overnight. She is feeling a lot better w/ relief of DOE / SOB this am.  IVF"s were started at 75 cc/hr. Creat down to 6.7 this am, Hb up to 10.1.  Making urine. K+ down to 4.2 today. Ca 6.4, corrects to 7.2.   Pt is f/b Dr Johnney Ou at [facility]. They are trying to get KP transplant, however w/ her recent rise in labs she says that their last discussion was that she might need dialysis first.  Does not have access.    ROS  no joint pain   no HA  no blurry vision  no rash  no dysuria  no difficulty voiding  no change in urine color    Past Medical History  Past Medical History:  Diagnosis Date  . Diabetes mellitus without complication ([facility])   . Preeclampsia   . Preeclampsia without severe features   . Sickle cell trait [facility])    Past Surgical History  Past Surgical History:  Procedure Laterality Date  . ESOPHAGOGASTRODUODENOSCOPY  08/2018   [facility] [place] and a gastric enptying study   . WISDOM TOOTH EXTRACTION     Family History  Family History  Problem Relation Age of Onset  . Lupus Mother   . Aneurysm Mother   . Colon polyps Maternal Aunt         great aunt  . Colon cancer Neg Hx   . Esophageal cancer Neg Hx    Social History  reports that she has never smoked. She has never used smokeless tobacco. She reports that she does not drink alcohol and does not use drugs. Allergies  Allergies  Allergen Reactions  . Metoclopramide Other (See Comments)    tardive dyskinesia per outside provider note Other reaction(s): Other (See Comments) ta disconsia   Home medications Prior to Admission medications   Medication Sig Start Date End Date Taking? Authorizing Provider  amLODipine (NORVASC) 5 MG tablet Take 5 mg by mouth daily. 09/23/20  Yes [provider]  calcitRIOL (ROCALTROL) 0.25 MCG capsule Take 0.25 mcg by mouth daily. 12/08/20  Yes [provider]  calcium carbonate (TUMS - DOSED IN MG ELEMENTAL CALCIUM) 500 MG chewable tablet Chew 2 tablets by mouth 3 (three) times daily.   Yes [provider]  chlorthalidone (HYGROTON) 25 MG tablet Take 12.5 mg by mouth daily.   Yes [provider]  cyanocobalamin (,VITAMIN B-12,) 1000 MCG/ML injection Inject 1 mL into the muscle every 30 (thirty) days. 06/03/20  Yes [provider]  etonogestrel (NEXPLANON) 68 MG IMPL implant Inject 1 each into the skin  once.   Yes [provider]  hydrALAZINE (APRESOLINE) 25 MG tablet Take 25 mg by mouth 3 (three) times daily. 09/23/20  Yes [provider]  Insulin Human (INSULIN PUMP) SOLN Inject into the skin. Humalog   Yes [provider]  insulin lispro (HUMALOG) 100 UNIT/ML injection Inject 3-6 Units into the skin 3 (three) times daily before meals. Via insulin pump; INJECT 100 UNITS MAX DAILY DOSEAGE VIA INSULIN PUMP UNDER THE SKIN   Yes [provider]  insulin lispro (HUMALOG) 100 UNIT/ML injection 0-9 Units TID and 0-5 units at bedside as per sliding scale 09/30/20 09/30/21 Yes Pokhrel, Laxman, MD  metoprolol tartrate (LOPRESSOR) 50 MG tablet Take 1 tablet (50 mg total) by mouth  2 (two) times daily. 03/23/18  Yes Patrecia Pour, Christean Grief, MD  sodium bicarbonate 650 MG tablet Take 650 mg by mouth 2 (two) times daily. 07/15/20  Yes [provider]  BAYER CONTOUR NEXT TEST test strip USE AS DIRECTED TO TEST 6 TO 8 TIMES DAILY Patient taking differently: 1 each by Other route. 10/01/14   Woodroe Mode, MD  Blood Glucose Monitoring Suppl (CONTOUR NEXT EZ MONITOR) W/DEVICE KIT 1 each by Does not apply route 6 (six) times daily. Provide lancets for testing six times daily. Type I DM with diabetes on Insulin Pump  DX 648.03 06/17/13   Woodroe Mode, MD  insulin glargine (LANTUS) 100 UNIT/ML injection Inject 0.22 mLs (22 Units total) into the skin daily. Patient not taking: Reported on 01/02/2021 10/01/20 10/31/20  Flora Lipps, MD     Vitals:   01/03/21 0100 01/03/21 0340 01/03/21 0400 01/03/21 0743  BP: (!) 146/84 135/86 (!) 141/85 (!) 162/96  Pulse: 82 79 79 78  Resp: 10 14 14 17   Temp:  (!) 97.5 F (36.4 C)  98.1 F (36.7 C)  TempSrc:  Oral  Oral  SpO2: 100% 100% 99% 100%  Weight:      Height:       Exam Gen alert, no distress No rash, cyanosis or gangrene Sclera anicteric, throat clear  No jvd or bruits Chest clear bilat to bases, no rales/ wheezing RRR no MRG Abd soft ntnd no mass or ascites +bs GU normal MS no joint effusions or deformity Ext trace pretib edema, no wounds or ulcers Neuro is alert, Ox 3 , nf       Home meds:  - norvasc 5/ hctz 25 qd/ hydralazine 25 tid/ metoprolol 50 bid  - lantus insulin 22u qd/ insulin pump humalog/ insulin humalog ac tid 0-9 u  - sod bicarb 650 bid   - prn's/ vitamins/ supplements     UA 5/7 - prot > 300, no rbc/ wbc    CXR 5/7 - IMPRESSION: No active disease.    Date                          Creat               eGFR ml/min   2012- 2015  0.47- 0.56 > 60    2016- 2019   0.72- 1.27 > 60   2020   1.04- 1.50 54- >60   2021   1.68- 2.92 24- 46, stage IIIB-IV   Feb 2022  4.10- 4.74 12- 14, stage V    12/31/20  7.46  7   01/02/21  7.12  7   01/03/21  6.74  8      Assessment/ Plan: 1.  AKi on CKD V - b/l creat 5's per pt, eGFR 10- 14 ml/min approx. Creat 7.2 here. Suspect element of vol depletion. No uremic signs or symptoms. Creat down 6.7 today w/ IVF"s and prbc's. No new suggestions, cont IVF's. If pt going to be here for a few days consider [facility]VS consult for permanent access (AVF/ AVG), otherwise this can be done in OP setting. Will follow.  2. Anemia, severe - prob in large part due to severe CKD and menstrual losses. SP 2u prbcs on 5/07. Believe she is getting outpt esa as well. Doing better today.  3. CP/ SOB/ DOE - relieved now w/ higher Hb.  4. HTN - BP's on the higher side, cont home meds 5. Volume - no vol overload, on room air, no edema.       Rob Tevion Laforge  MD 01/03/2021, 10:28 AM  Recent Labs  Lab 01/02/21 1801 01/02/21 1808 01/03/21 0634  WBC 22.0*  --  12.5*  HGB 4.0* 9.5* 10.1*   Recent Labs  Lab 12/31/20 1341 01/02/21 1801 01/02/21 1808 01/03/21 0634  K 5.3* 6.4* 6.0* 4.2  4.2  BUN 72* 81*  --  79*  77*  CREATININE 7.46* 7.12*  --  6.74*  6.77*  CALCIUM 5.5*  5.6* 6.0*  --  6.4*  6.4*  PHOS 7.6*  --   --  6.2*

## 2021-01-03 NOTE — Plan of Care (Signed)
Discussed with patient plan of care, admission questions and CHG wipes with some teach back displayed.  Problem: Education: Goal: Knowledge of General Education information will improve Description: Including pain rating scale, medication(s)/side effects and non-pharmacologic comfort measures Outcome: Progressing

## 2021-01-03 NOTE — Progress Notes (Signed)
Hypoglycemic Event  CBG: 29 (04:05 am)  Treatment: D50 25 mL (12.5 gm)  Symptoms: Shaky  Follow-up CBG: Time:04:21 am CBG Result:90  Possible Reasons for Event: Medication regimen  Comments/MD notified:Dr Nevada Crane

## 2021-01-03 NOTE — Discharge Summary (Signed)
Physician Discharge Summary  Kimberly [name] [id number] DOB: 1990-09-01 DOA: 01/02/2021  PCP: Jacelyn Pi, MD  Admit date: 01/02/2021 Discharge date: 01/03/2021  Admitted From: Home Disposition: Home  Recommendations for Outpatient Follow-up:  1. Follow ups as below. 2. Please obtain CBC/BMP/Mag at follow up 3. Please follow up on the following pending results: None  Home Health: None Equipment/Devices: None  Discharge Condition: Stable CODE STATUS: Full code   Hospital Course: 30 year old F with PMH of DM-1 on insulin pump, CKD-5, ACD and HTN presenting with chest pain, shortness of breath, DOE, nausea and orthopnea and found to have Hgb of 4.0 from 7.02 days prior, hyperkalemia to 6.4 and hypocalcemia.  Patient has no history of GI bleed, melena or hematochezia.  Transfused 2 unit and hemoglobin rose to 9.5.  Repeat hemoglobin was 10 the next morning.  It failed her initial hemoglobin of 4.0 was not accurate.  Nephrology consulted about hyperkalemia and CKD-5.  Patient received Lokelma, and hyperkalemia resolved.  Renal function improved with IV fluid.  She also received IV calcium gluconate for hypocalcemia.  TTE with LVEF of 55 to 60%, G2-DD.  Chlorthalidone changed to Lasix.   On the day of discharge, patient symptoms resolved.  She felt well and ready to go home to follow-up with her nephrologist.  Nephrology cleared patient for discharge  See individual problem list below for more on hospital course. Discharge Diagnoses:  Symptomatic anemia/anemia of renal disease-transfused 2 units.  H&H remained stable after transfusion. Recent Labs    06/03/20 0946 08/07/20 1029 09/29/20 1626 12/31/20 1348 01/02/21 1801 01/02/21 1808 01/03/21 0634  HGB 9.6* 10.2* 10.2* 7.0* 4.0* 9.5* 10.1*  -Outpatient follow-up with nephrology -IV iron and EPO per nephrology. -Recheck CBC at follow-up  Chest pain: Likely symptomatic anemia.  High-sensitivity troponin negative x2.  CKD-5  progressing to ESRD Anion gap metabolic acidosis: Likely due to azotemia Hyperkalemia-resolved after calcium gluconate and Lokelma Chronic hypocalcemia-likely due to renal failure.  Received IV calcium gluconate -Continue home calcitriol -Outpatient follow-up with nephrology   QTC prolongation-likely due to electrolyte arrangement -Avoid QTC prolonging drugs  DM-1 with hypo and hyperglycemia -Continue home insulin pump  Chronic diastolic CHF-TTE as above. -Changed chlorthalidone to p.o. Lasix 40 mg daily -Counseled on sodium and fluid restriction  Essential hypertension: BP slightly elevated. -Continue home amlodipine, hydralazine and metoprolol -Changed chlorthalidone to Lasix    Body mass index is 26.27 kg/m.            Discharge Exam: Vitals:   01/03/21 0400 01/03/21 0743  BP: (!) 141/85 (!) 162/96  Pulse: 79 78  Resp: 14 17  Temp:  98.1 F (36.7 C)  SpO2: 99% 100%    GENERAL: No apparent distress.  Nontoxic. HEENT: MMM.  Vision and hearing grossly intact.  NECK: Supple.  No apparent JVD.  RESP:  No IWOB.  Fair aeration bilaterally. CVS:  RRR. Heart sounds normal.  ABD/GI/GU: Bowel sounds present. Soft. Non tender.  MSK/EXT:  Moves extremities. No apparent deformity. No edema.  SKIN: no apparent skin lesion or wound NEURO: Awake, alert and oriented appropriately.  No apparent focal neuro deficit. PSYCH: Calm. Normal affect.  Discharge Instructions  Discharge Instructions    Call MD for:  difficulty breathing, headache or visual disturbances   Complete by: As directed    Call MD for:  extreme fatigue   Complete by: As directed    Call MD for:  persistant dizziness or light-headedness   Complete by: As directed  Call MD for:  persistant nausea and vomiting   Complete by: As directed    Diet - low sodium heart healthy   Complete by: As directed    Discharge instructions   Complete by: As directed    It has been a pleasure taking care of  you!  You were hospitalized with chest pain, shortness of breath and nausea likely due to anemia and heart failure.  Your symptoms and hemoglobin level improved after blood transfusion.  Your echocardiogram shows heart failure due to stiffening of the heart muscles.  We have changed your fluid medication based on your echocardiogram and your kidney function.  Please review your new medication list and the directions on your medications before you take them.  Please follow-up with your primary care doctor in 1 to 2 weeks to have your levels rechecked.  Also follow-up with your nephrologist as previously planned or sooner if needed.    Take care,   Increase activity slowly   Complete by: As directed      Allergies as of 01/03/2021      Reactions   Metoclopramide Other (See Comments)   tardive dyskinesia per outside provider note Other reaction(s): Other (See Comments) ta disconsia      Medication List    STOP taking these medications   chlorthalidone 25 MG tablet Commonly known as: HYGROTON   insulin glargine 100 UNIT/ML injection Commonly known as: LANTUS     TAKE these medications   amLODipine 5 MG tablet Commonly known as: NORVASC Take 5 mg by mouth daily.   Bayer Contour Next Test test strip Generic drug: glucose blood USE AS DIRECTED TO TEST 6 TO 8 TIMES DAILY What changed: See the new instructions.   calcitRIOL 0.25 MCG capsule Commonly known as: ROCALTROL Take 0.25 mcg by mouth daily.   calcium carbonate 500 MG chewable tablet Commonly known as: TUMS - dosed in mg elemental calcium Chew 2 tablets by mouth 3 (three) times daily.   CONTOUR NEXT EZ MONITOR w/Device Kit 1 each by Does not apply route 6 (six) times daily. Provide lancets for testing six times daily. Type I DM with diabetes on Insulin Pump  DX 648.03   cyanocobalamin 1000 MCG/ML injection Commonly known as: (VITAMIN B-12) Inject 1 mL into the muscle every 30 (thirty) days.   etonogestrel 68 MG Impl  implant Commonly known as: NEXPLANON Inject 1 each into the skin once.   furosemide 40 MG tablet Commonly known as: Lasix Take 1 tablet (40 mg total) by mouth daily.   hydrALAZINE 25 MG tablet Commonly known as: APRESOLINE Take 25 mg by mouth 3 (three) times daily.   insulin lispro 100 UNIT/ML injection Commonly known as: HUMALOG Inject 3-6 Units into the skin 3 (three) times daily before meals. Via insulin pump; INJECT 100 UNITS MAX DAILY DOSEAGE VIA INSULIN PUMP UNDER THE SKIN   insulin lispro 100 UNIT/ML injection Commonly known as: HUMALOG 0-9 Units TID and 0-5 units at bedside as per sliding scale   insulin pump Soln Inject into the skin. Humalog   metoprolol tartrate 50 MG tablet Commonly known as: LOPRESSOR Take 1 tablet (50 mg total) by mouth 2 (two) times daily.   sodium bicarbonate 650 MG tablet Take 650 mg by mouth 2 (two) times daily.       Consultations:  Nephrology  Procedures/Studies:  2D Echo on 01/03/21 1. Left ventricular ejection fraction, by estimation, is 55 to 60%. The  left ventricle has normal function. The left  ventricle has no regional  wall motion abnormalities. There is moderate left ventricular hypertrophy.  Left ventricular diastolic  parameters are consistent with Grade II diastolic dysfunction  (pseudonormalization). The average left ventricular global longitudinal  strain is -13.4 %. The global longitudinal strain is abnormal.  2. Right ventricular systolic function is normal. The right ventricular  size is mildly enlarged. There is normal pulmonary artery systolic  pressure. The estimated right ventricular systolic pressure is 54.6 mmHg.  3. Left atrial size was mildly dilated.  4. The mitral valve is normal in structure. Mild to moderate mitral valve  regurgitation. No evidence of mitral stenosis.  5. Tricuspid valve regurgitation is mild to moderate.  6. The aortic valve is tricuspid. Aortic valve regurgitation is trivial.   No aortic stenosis is present.  7. The inferior vena cava is dilated in size with >50% respiratory  variability, suggesting right atrial pressure of 8 mmHg.    US RENAL  Result Date: 12/08/2020 CLINICAL DATA:  Acute kidney injury, chronic kidney disease stage 3 EXAM: RENAL / URINARY TRACT ULTRASOUND COMPLETE COMPARISON:  12/01/2019 FINDINGS: Right Kidney: Renal measurements: 9.8 x 3.9 x 3.4 cm = volume: 107 mL. Mildly increased echotexture. No mass or hydronephrosis. Left Kidney: Renal measurements: 9.8 x 6.5 x 5.4 cm = volume: 182 mL. Mildly increased echotexture. No mass or hydronephrosis. Bladder: Appears normal for degree of bladder distention. Other: None. IMPRESSION: No acute findings.  No hydronephrosis. Mildly increased echotexture throughout the kidneys. Electronically Signed   By: Rolm Baptise M.D.   On: 12/08/2020 11:44   DG Chest Portable 1 View  Result Date: 01/02/2021 CLINICAL DATA:  Chest pain and nausea EXAM: PORTABLE CHEST 1 VIEW COMPARISON:  03/19/2018 FINDINGS: The heart size and mediastinal contours are within normal limits. Both lungs are clear. The visualized skeletal structures are unremarkable. IMPRESSION: No active disease. Electronically Signed   By: Inez Catalina M.D.   On: 01/02/2021 16:58   ECHOCARDIOGRAM COMPLETE  Result Date: 01/03/2021    ECHOCARDIOGRAM REPORT   Patient Name:   Kimberly [name] Date of Exam: 01/03/2021 Medical Rec #:  [id number]         Height:       65.0 in Accession #:    [id number]        Weight:       157.8 lb Date of Birth:  1990/11/30         BSA:          1.789 m Patient Age:    30 years          BP:           141/85 mmHg Patient Gender: F                 HR:           79 bpm. Exam Location:  Inpatient Procedure: 2D Echo, Cardiac Doppler, Color Doppler and Strain Analysis Indications:    Chest pain  History:        Patient has no prior history of Echocardiogram examinations.                 Risk Factors:Diabetes.  Sonographer:    [place]  Referring Phys: [id number] [place]  1. Left ventricular ejection fraction, by estimation, is 55 to 60%. The left ventricle has normal function. The left ventricle has no regional wall motion abnormalities. There is moderate left ventricular hypertrophy. Left ventricular diastolic parameters are consistent with  Grade II diastolic dysfunction (pseudonormalization). The average left ventricular global longitudinal strain is -13.4 %. The global longitudinal strain is abnormal.  2. Right ventricular systolic function is normal. The right ventricular size is mildly enlarged. There is normal pulmonary artery systolic pressure. The estimated right ventricular systolic pressure is 88.5 mmHg.  3. Left atrial size was mildly dilated.  4. The mitral valve is normal in structure. Mild to moderate mitral valve regurgitation. No evidence of mitral stenosis.  5. Tricuspid valve regurgitation is mild to moderate.  6. The aortic valve is tricuspid. Aortic valve regurgitation is trivial. No aortic stenosis is present.  7. The inferior vena cava is dilated in size with >50% respiratory variability, suggesting right atrial pressure of 8 mmHg. FINDINGS  Left Ventricle: Left ventricular ejection fraction, by estimation, is 55 to 60%. The left ventricle has normal function. The left ventricle has no regional wall motion abnormalities. The average left ventricular global longitudinal strain is -13.4 %. The global longitudinal strain is abnormal. The left ventricular internal cavity size was normal in size. There is moderate left ventricular hypertrophy. Left ventricular diastolic parameters are consistent with Grade II diastolic dysfunction (pseudonormalization). Right Ventricle: The right ventricular size is mildly enlarged. No increase in right ventricular wall thickness. Right ventricular systolic function is normal. There is normal pulmonary artery systolic pressure. The tricuspid regurgitant velocity is 2.58  m/s, and  with an assumed right atrial pressure of 8 mmHg, the estimated right ventricular systolic pressure is 02.7 mmHg. Left Atrium: Left atrial size was mildly dilated. Right Atrium: Right atrial size was normal in size. Pericardium: There is no evidence of pericardial effusion. Mitral Valve: The mitral valve is normal in structure. Mild to moderate mitral valve regurgitation. No evidence of mitral valve stenosis. MV peak gradient, 9.6 mmHg. The mean mitral valve gradient is 5.0 mmHg. Tricuspid Valve: The tricuspid valve is normal in structure. Tricuspid valve regurgitation is mild to moderate. No evidence of tricuspid stenosis. Aortic Valve: The aortic valve is tricuspid. Aortic valve regurgitation is trivial. No aortic stenosis is present. Aortic valve mean gradient measures 3.0 mmHg. Aortic valve peak gradient measures 4.8 mmHg. Aortic valve area, by VTI measures 1.84 cm. Pulmonic Valve: The pulmonic valve was normal in structure. Pulmonic valve regurgitation is mild. No evidence of pulmonic stenosis. Aorta: The aortic root is normal in size and structure. Venous: The inferior vena cava is dilated in size with greater than 50% respiratory variability, suggesting right atrial pressure of 8 mmHg. IAS/Shunts: No atrial level shunt detected by color flow Doppler.  LEFT VENTRICLE PLAX 2D LVIDd:         4.20 cm     Diastology LVIDs:         3.00 cm     LV e' medial:    5.87 cm/s LV PW:         1.50 cm     LV E/e' medial:  23.3 LV IVS:        1.50 cm     LV e' lateral:   7.94 cm/s LVOT diam:     1.80 cm     LV E/e' lateral: 17.3 LV SV:         41 LV SV Index:   23          2D Longitudinal Strain LVOT Area:     2.54 cm    2D Strain GLS Avg:     -13.4 %  LV Volumes (MOD) LV vol d, MOD A2C: 66.6  ml LV vol d, MOD A4C: 72.0 ml LV vol s, MOD A2C: 38.3 ml LV vol s, MOD A4C: 36.2 ml LV SV MOD A2C:     28.3 ml LV SV MOD A4C:     72.0 ml LV SV MOD BP:      31.8 ml RIGHT VENTRICLE RV S prime:     13.60 cm/s TAPSE (M-mode): 2.5 cm LEFT  ATRIUM             Index       RIGHT ATRIUM           Index LA diam:        3.60 cm 2.01 cm/m  RA Area:     15.10 cm LA Vol (A2C):   81.2 ml 45.39 ml/m RA Volume:   38.30 ml  21.41 ml/m LA Vol (A4C):   61.4 ml 34.32 ml/m LA Biplane Vol: 70.7 ml 39.52 ml/m  AORTIC VALVE                   PULMONIC VALVE AV Area (Vmax):    1.74 cm    PV Vmax:       0.71 m/s AV Area (Vmean):   1.92 cm    PV Vmean:      58.500 cm/s AV Area (VTI):     1.84 cm    PV VTI:        0.193 m AV Vmax:           110.00 cm/s PV Peak grad:  2.0 mmHg AV Vmean:          74.800 cm/s PV Mean grad:  1.0 mmHg AV VTI:            0.221 m AV Peak Grad:      4.8 mmHg AV Mean Grad:      3.0 mmHg LVOT Vmax:         75.00 cm/s LVOT Vmean:        56.400 cm/s LVOT VTI:          0.160 m LVOT/AV VTI ratio: 0.72  AORTA Ao Root diam: 2.90 cm Ao Asc diam:  2.80 cm MITRAL VALVE                 TRICUSPID VALVE MV Area (PHT): 4.49 cm      TR Peak grad:   26.6 mmHg MV Area VTI:   1.24 cm      TR Vmax:        258.00 cm/s MV Peak grad:  9.6 mmHg MV Mean grad:  5.0 mmHg      SHUNTS MV Vmax:       1.55 m/s      Systemic VTI:  0.16 m MV Vmean:      103.0 cm/s    Systemic Diam: 1.80 cm MV Decel Time: 169 msec MR Peak grad:    149.1 mmHg MR Mean grad:    105.0 mmHg MR Vmax:         610.50 cm/s MR Vmean:        484.0 cm/s MR PISA:         1.01 cm MR PISA Eff ROA: 4 mm MR PISA Radius:  0.40 cm MV E velocity: 137.00 cm/s MV A velocity: 81.50 cm/s MV E/A ratio:  1.68 Cherlynn Kaiser MD Electronically signed by Cherlynn Kaiser MD Signature Date/Time: 01/03/2021/11:50:42 AM    Final         The results of significant diagnostics from  this hospitalization (including imaging, microbiology, ancillary and laboratory) are listed below for reference.     Microbiology: Recent Results (from the past 240 hour(s))  Resp Panel by RT-PCR (Flu A&B, Covid) Nasopharyngeal Swab     Status: None   Collection Time: 01/02/21  5:24 PM   Specimen: Nasopharyngeal Swab;  Nasopharyngeal(NP) swabs in vial transport medium  Result Value Ref Range Status   SARS Coronavirus 2 by RT PCR NEGATIVE NEGATIVE Final    Comment: (NOTE) SARS-CoV-2 target nucleic acids are NOT DETECTED.  The SARS-CoV-2 RNA is generally detectable in upper respiratory specimens during the acute phase of infection. The lowest concentration of SARS-CoV-2 viral copies this assay can detect is 138 copies/mL. A negative result does not preclude SARS-Cov-2 infection and should not be used as the sole basis for treatment or other patient management decisions. A negative result may occur with  improper specimen collection/handling, submission of specimen other than nasopharyngeal swab, presence of viral mutation(s) within the areas targeted by this assay, and inadequate number of viral copies(<138 copies/mL). A negative result must be combined with clinical observations, patient history, and epidemiological information. The expected result is Negative.  Fact Sheet for Patients:  EntrepreneurPulse.com.au  Fact Sheet for Healthcare Providers:  IncredibleEmployment.be  This test is no t yet approved or cleared by the [place] FDA and  has been authorized for detection and/or diagnosis of SARS-CoV-2 by FDA under an Emergency Use Authorization (EUA). This EUA will remain  in effect (meaning this test can be used) for the duration of the COVID-19 declaration under Section 564(b)(1) of the Act, 21 U.S.C.section 360bbb-3(b)(1), unless the authorization is terminated  or revoked sooner.       Influenza A by PCR NEGATIVE NEGATIVE Final   Influenza B by PCR NEGATIVE NEGATIVE Final    Comment: (NOTE) The Xpert Xpress SARS-CoV-2/FLU/RSV plus assay is intended as an aid in the diagnosis of influenza from Nasopharyngeal swab specimens and should not be used as a sole basis for treatment. Nasal washings and aspirates are unacceptable for Xpert Xpress  SARS-CoV-2/FLU/RSV testing.  Fact Sheet for Patients: EntrepreneurPulse.com.au  Fact Sheet for Healthcare Providers: IncredibleEmployment.be  This test is not yet approved or cleared by the [place] FDA and has been authorized for detection and/or diagnosis of SARS-CoV-2 by FDA under an Emergency Use Authorization (EUA). This EUA will remain in effect (meaning this test can be used) for the duration of the COVID-19 declaration under Section 564(b)(1) of the Act, 21 U.S.C. section 360bbb-3(b)(1), unless the authorization is terminated or revoked.  Performed at [facility], [place] [address], [place], [place] [address]   MRSA PCR Screening     Status: None   Collection Time: 01/03/21 12:05 AM   Specimen: Nasal Mucosa; Nasopharyngeal  Result Value Ref Range Status   MRSA by PCR NEGATIVE NEGATIVE Final    Comment:        The GeneXpert MRSA Assay (FDA approved for NASAL specimens only), is one component of a comprehensive MRSA colonization surveillance program. It is not intended to diagnose MRSA infection nor to guide or monitor treatment for MRSA infections. Performed at [facility], [place] [address], [place], [place] [address]      Labs:  CBC: Recent Labs  Lab 12/31/20 1348 01/02/21 1801 01/02/21 1808 01/03/21 0634  WBC  --  22.0*  --  12.5*  NEUTROABS  --  18.7*  --   --   HGB 7.0* 4.0* 9.5* 10.1*  HCT  --  12.0* 28.0* 29.0*  MCV  --  78.9*  --  76.9*  PLT  --  296  --  233   BMP &GFR Recent Labs  Lab 12/31/20 1341 01/02/21 1801 01/02/21 1808 01/03/21 0634  NA 136 131* 133* 138  137  K 5.3* 6.4* 6.0* 4.2  4.2  CL 108 105  --  108  107  CO2 17* 11*  --  17*  18*  GLUCOSE 250* 459*  --  159*  160*  BUN 72* 81*  --  79*  77*  CREATININE 7.46* 7.12*  --  6.74*  6.77*  CALCIUM 5.5*  5.6* 6.0*  --  6.4*  6.4*  MG  --   --   --  2.0  PHOS 7.6*  --   --  6.2*   Estimated Creatinine Clearance:  11.8 mL/min (A) (by C-G formula based on SCr of 6.77 mg/dL (H)). Liver & Pancreas: Recent Labs  Lab 12/31/20 1341 01/02/21 1801 01/03/21 0634  AST  --  42*  --   ALT  --  29  --   ALKPHOS  --  128*  --   BILITOT  --  1.0  --   PROT  --  6.5  --   ALBUMIN 2.7* 3.1* 2.9*   No results for input(s): LIPASE, AMYLASE in the last 168 hours. No results for input(s): AMMONIA in the last 168 hours. Diabetic: Recent Labs    01/03/21 0634  HGBA1C 7.4*   Recent Labs  Lab 01/03/21 0529 01/03/21 0634 01/03/21 0746 01/03/21 0935 01/03/21 1159  GLUCAP 105* 139* 184* 154* 72   Cardiac Enzymes: No results for input(s): CKTOTAL, CKMB, CKMBINDEX, TROPONINI in the last 168 hours. No results for input(s): PROBNP in the last 8760 hours. Coagulation Profile: No results for input(s): INR, PROTIME in the last 168 hours. Thyroid Function Tests: No results for input(s): TSH, T4TOTAL, FREET4, T3FREE, THYROIDAB in the last 72 hours. Lipid Profile: Recent Labs    01/03/21 0634  CHOL 132  HDL 64  LDLCALC 54  TRIG 68  CHOLHDL 2.1   Anemia Panel: Recent Labs    01/02/21 1801 01/02/21 1807  VITAMINB12  --  288  FOLATE  --  26.2  FERRITIN  --  57  TIBC  --  358  IRON  --  68  RETICCTPCT 2.5  --    Urine analysis:    Component Value Date/Time   COLORURINE STRAW (A) 01/02/2021 1807   APPEARANCEUR HAZY (A) 01/02/2021 1807   LABSPEC 1.009 01/02/2021 1807   PHURINE 5.0 01/02/2021 1807   GLUCOSEU >=500 (A) 01/02/2021 1807   HGBUR SMALL (A) 01/02/2021 1807   BILIRUBINUR NEGATIVE 01/02/2021 1807   KETONESUR 5 (A) 01/02/2021 1807   PROTEINUR >=300 (A) 01/02/2021 1807   UROBILINOGEN 0.2 03/21/2015 2010   NITRITE NEGATIVE 01/02/2021 1807   LEUKOCYTESUR NEGATIVE 01/02/2021 1807   Sepsis Labs: Invalid input(s): PROCALCITONIN, LACTICIDVEN   Time coordinating discharge: 40 minutes  SIGNED:  Mercy Riding, MD  [facility] 01/03/2021, 6:51 PM  If 7PM-7AM, please contact  night-coverage [email or web address]

## 2021-01-03 NOTE — Progress Notes (Signed)
*  PRELIMINARY RESULTS* Echocardiogram 2D Echocardiogram has been performed with strain imaging.  Luisa Hart RDCS 01/03/2021, 9:02 AM

## 2021-01-05 LAB — BPAM RBC
Blood Product Expiration Date: 202205112359
Blood Product Expiration Date: 202205112359
Blood Product Expiration Date: 202205282359
Blood Product Expiration Date: 202205282359
ISSUE DATE / TIME: 202205072052
ISSUE DATE / TIME: 202205080022
ISSUE DATE / TIME: 202205090932
Unit Type and Rh: 600
Unit Type and Rh: 600
Unit Type and Rh: 6200
Unit Type and Rh: 6200

## 2021-01-05 LAB — TYPE AND SCREEN
ABO/RH(D): A POS
Antibody Screen: NEGATIVE
Unit division: 0
Unit division: 0
Unit division: 0
Unit division: 0

## 2021-01-06 ENCOUNTER — Encounter (HOSPITAL_COMMUNITY): Payer: No Typology Code available for payment source

## 2021-01-07 ENCOUNTER — Encounter (HOSPITAL_COMMUNITY): Payer: No Typology Code available for payment source

## 2021-01-11 ENCOUNTER — Inpatient Hospital Stay (HOSPITAL_COMMUNITY)
Admission: EM | Admit: 2021-01-11 | Discharge: 2021-01-15 | DRG: 673 | Disposition: A | Discharge status: Home / Self Care | Payer: No Typology Code available for payment source | Attending: Internal Medicine | Admitting: Internal Medicine

## 2021-01-11 ENCOUNTER — Emergency Department (HOSPITAL_COMMUNITY): Payer: No Typology Code available for payment source

## 2021-01-11 ENCOUNTER — Encounter (HOSPITAL_COMMUNITY): Payer: Self-pay | Admitting: *Deleted

## 2021-01-11 ENCOUNTER — Other Ambulatory Visit: Payer: Self-pay

## 2021-01-11 DIAGNOSIS — D631 Anemia in chronic kidney disease: Secondary | ICD-10-CM | POA: Diagnosis present

## 2021-01-11 DIAGNOSIS — E1159 Type 2 diabetes mellitus with other circulatory complications: Secondary | ICD-10-CM | POA: Diagnosis not present

## 2021-01-11 DIAGNOSIS — I953 Hypotension of hemodialysis: Secondary | ICD-10-CM | POA: Diagnosis not present

## 2021-01-11 DIAGNOSIS — Z832 Family history of diseases of the blood and blood-forming organs and certain disorders involving the immune mechanism: Secondary | ICD-10-CM | POA: Diagnosis not present

## 2021-01-11 DIAGNOSIS — M898X9 Other specified disorders of bone, unspecified site: Secondary | ICD-10-CM | POA: Diagnosis present

## 2021-01-11 DIAGNOSIS — E1022 Type 1 diabetes mellitus with diabetic chronic kidney disease: Secondary | ICD-10-CM | POA: Diagnosis present

## 2021-01-11 DIAGNOSIS — E160 Drug-induced hypoglycemia without coma: Secondary | ICD-10-CM | POA: Diagnosis present

## 2021-01-11 DIAGNOSIS — Z9641 Presence of insulin pump (external) (internal): Secondary | ICD-10-CM | POA: Diagnosis present

## 2021-01-11 DIAGNOSIS — G9341 Metabolic encephalopathy: Secondary | ICD-10-CM | POA: Diagnosis present

## 2021-01-11 DIAGNOSIS — D573 Sickle-cell trait: Secondary | ICD-10-CM | POA: Diagnosis present

## 2021-01-11 DIAGNOSIS — Z79899 Other long term (current) drug therapy: Secondary | ICD-10-CM

## 2021-01-11 DIAGNOSIS — E875 Hyperkalemia: Secondary | ICD-10-CM | POA: Diagnosis present

## 2021-01-11 DIAGNOSIS — K219 Gastro-esophageal reflux disease without esophagitis: Secondary | ICD-10-CM | POA: Diagnosis present

## 2021-01-11 DIAGNOSIS — Z794 Long term (current) use of insulin: Secondary | ICD-10-CM

## 2021-01-11 DIAGNOSIS — E1065 Type 1 diabetes mellitus with hyperglycemia: Secondary | ICD-10-CM | POA: Diagnosis present

## 2021-01-11 DIAGNOSIS — I120 Hypertensive chronic kidney disease with stage 5 chronic kidney disease or end stage renal disease: Principal | ICD-10-CM | POA: Diagnosis present

## 2021-01-11 DIAGNOSIS — Z8371 Family history of colonic polyps: Secondary | ICD-10-CM | POA: Diagnosis not present

## 2021-01-11 DIAGNOSIS — N189 Chronic kidney disease, unspecified: Secondary | ICD-10-CM

## 2021-01-11 DIAGNOSIS — M79601 Pain in right arm: Secondary | ICD-10-CM | POA: Diagnosis not present

## 2021-01-11 DIAGNOSIS — E1069 Type 1 diabetes mellitus with other specified complication: Secondary | ICD-10-CM | POA: Diagnosis present

## 2021-01-11 DIAGNOSIS — Z20822 Contact with and (suspected) exposure to covid-19: Secondary | ICD-10-CM | POA: Diagnosis present

## 2021-01-11 DIAGNOSIS — N2581 Secondary hyperparathyroidism of renal origin: Secondary | ICD-10-CM | POA: Diagnosis present

## 2021-01-11 DIAGNOSIS — E1021 Type 1 diabetes mellitus with diabetic nephropathy: Secondary | ICD-10-CM | POA: Diagnosis present

## 2021-01-11 DIAGNOSIS — Z888 Allergy status to other drugs, medicaments and biological substances status: Secondary | ICD-10-CM

## 2021-01-11 DIAGNOSIS — Z992 Dependence on renal dialysis: Secondary | ICD-10-CM

## 2021-01-11 DIAGNOSIS — T383X5A Adverse effect of insulin and oral hypoglycemic [antidiabetic] drugs, initial encounter: Secondary | ICD-10-CM | POA: Diagnosis present

## 2021-01-11 DIAGNOSIS — I152 Hypertension secondary to endocrine disorders: Secondary | ICD-10-CM | POA: Diagnosis present

## 2021-01-11 DIAGNOSIS — N185 Chronic kidney disease, stage 5: Secondary | ICD-10-CM | POA: Diagnosis not present

## 2021-01-11 DIAGNOSIS — N186 End stage renal disease: Secondary | ICD-10-CM | POA: Diagnosis present

## 2021-01-11 DIAGNOSIS — E162 Hypoglycemia, unspecified: Secondary | ICD-10-CM

## 2021-01-11 DIAGNOSIS — M7989 Other specified soft tissue disorders: Secondary | ICD-10-CM | POA: Diagnosis not present

## 2021-01-11 DIAGNOSIS — R509 Fever, unspecified: Secondary | ICD-10-CM

## 2021-01-11 LAB — COMPREHENSIVE METABOLIC PANEL
ALT: 10 U/L (ref 0–44)
AST: 10 U/L — ABNORMAL LOW (ref 15–41)
Albumin: 3.1 g/dL — ABNORMAL LOW (ref 3.5–5.0)
Alkaline Phosphatase: 91 U/L (ref 38–126)
Anion gap: 13 (ref 5–15)
BUN: 61 mg/dL — ABNORMAL HIGH (ref 6–20)
CO2: 19 mmol/L — ABNORMAL LOW (ref 22–32)
Calcium: 6.1 mg/dL — CL (ref 8.9–10.3)
Chloride: 108 mmol/L (ref 98–111)
Creatinine, Ser: 6.97 mg/dL — ABNORMAL HIGH (ref 0.44–1.00)
GFR, Estimated: 7 mL/min — ABNORMAL LOW (ref >60)
Glucose, Bld: 44 mg/dL — CL (ref 70–99)
Potassium: 3.2 mmol/L — ABNORMAL LOW (ref 3.5–5.1)
Sodium: 140 mmol/L (ref 135–145)
Total Bilirubin: 0.4 mg/dL (ref 0.3–1.2)
Total Protein: 6.9 g/dL (ref 6.5–8.1)

## 2021-01-11 LAB — PROTIME-INR
INR: 1 (ref 0.8–1.2)
Prothrombin Time: 13.2 seconds (ref 11.4–15.2)

## 2021-01-11 LAB — CBC WITH DIFFERENTIAL/PLATELET
Abs Immature Granulocytes: 0.04 10*3/uL (ref 0.00–0.07)
Basophils Absolute: 0 10*3/uL (ref 0.0–0.1)
Basophils Relative: 0 %
Eosinophils Absolute: 0.3 10*3/uL (ref 0.0–0.5)
Eosinophils Relative: 4 %
HCT: 30.8 % — ABNORMAL LOW (ref 36.0–46.0)
Hemoglobin: 10.3 g/dL — ABNORMAL LOW (ref 12.0–15.0)
Immature Granulocytes: 1 %
Lymphocytes Relative: 20 %
Lymphs Abs: 1.7 10*3/uL (ref 0.7–4.0)
MCH: 26.3 pg (ref 26.0–34.0)
MCHC: 33.4 g/dL (ref 30.0–36.0)
MCV: 78.6 fL — ABNORMAL LOW (ref 80.0–100.0)
Monocytes Absolute: 0.7 10*3/uL (ref 0.1–1.0)
Monocytes Relative: 8 %
Neutro Abs: 6.1 10*3/uL (ref 1.7–7.7)
Neutrophils Relative %: 67 %
Platelets: 232 10*3/uL (ref 150–400)
RBC: 3.92 MIL/uL (ref 3.87–5.11)
RDW: 15.5 % (ref 11.5–15.5)
WBC: 8.9 10*3/uL (ref 4.0–10.5)
nRBC: 0 % (ref 0.0–0.2)

## 2021-01-11 LAB — CBG MONITORING, ED
Glucose-Capillary: 167 mg/dL — ABNORMAL HIGH (ref 70–99)
Glucose-Capillary: 32 mg/dL — CL (ref 70–99)
Glucose-Capillary: 240 mg/dL — ABNORMAL HIGH (ref 70–99)
Glucose-Capillary: 414 mg/dL — ABNORMAL HIGH (ref 70–99)
Glucose-Capillary: 419 mg/dL — ABNORMAL HIGH (ref 70–99)
Glucose-Capillary: 391 mg/dL — ABNORMAL HIGH (ref 70–99)
Glucose-Capillary: 302 mg/dL — ABNORMAL HIGH (ref 70–99)

## 2021-01-11 LAB — I-STAT BETA HCG BLOOD, ED (MC, WL, AP ONLY): I-stat hCG, quantitative: <5 m[IU]/mL (ref <5)

## 2021-01-11 LAB — TYPE AND SCREEN
ABO/RH(D): A POS
Antibody Screen: NEGATIVE

## 2021-01-11 LAB — RESP PANEL BY RT-PCR (FLU A&B, COVID) ARPGX2
Influenza A by PCR: NEGATIVE
Influenza B by PCR: NEGATIVE
SARS Coronavirus 2 by RT PCR: NEGATIVE

## 2021-01-11 LAB — MAGNESIUM: Magnesium: 2 mg/dL (ref 1.7–2.4)

## 2021-01-11 LAB — GLUCOSE, CAPILLARY: Glucose-Capillary: 227 mg/dL — ABNORMAL HIGH (ref 70–99)

## 2021-01-11 MED ORDER — CALCIUM GLUCONATE-NACL 2-0.675 GM/100ML-% IV SOLN
2.0000 g | Freq: Once | INTRAVENOUS | Status: AC
  Administered 2021-01-11: 2000 mg via INTRAVENOUS
  Filled 2021-01-11: qty 100

## 2021-01-11 MED ORDER — FUROSEMIDE 20 MG PO TABS: 40.0000 mg | ORAL_TABLET | Freq: Every day | ORAL | Status: DC

## 2021-01-11 MED ORDER — INSULIN ASPART 100 UNIT/ML IJ SOLN: 0.0000 [IU] | Freq: Three times a day (TID) | INTRAMUSCULAR | Status: DC

## 2021-01-11 MED ORDER — DEXTROSE 50 % IV SOLN
1.0000 | Freq: Once | INTRAVENOUS | Status: AC
  Administered 2021-01-11: 50 mL via INTRAVENOUS
  Filled 2021-01-11: qty 50

## 2021-01-11 MED ORDER — CALCIUM CARBONATE ANTACID 500 MG PO CHEW
2.0000 | CHEWABLE_TABLET | Freq: Three times a day (TID) | ORAL | Status: DC
  Administered 2021-01-11 – 2021-01-15 (×11): 400 mg via ORAL
  Filled 2021-01-11 (×11): qty 2

## 2021-01-11 MED ORDER — AMLODIPINE BESYLATE 5 MG PO TABS
5.0000 mg | ORAL_TABLET | Freq: Every day | ORAL | Status: DC
  Administered 2021-01-12 – 2021-01-15 (×4): 5 mg via ORAL
  Filled 2021-01-11 (×4): qty 1

## 2021-01-11 MED ORDER — HEPARIN SODIUM (PORCINE) 5000 UNIT/ML IJ SOLN
5000.0000 [IU] | Freq: Three times a day (TID) | INTRAMUSCULAR | Status: DC
  Administered 2021-01-11: 5000 [IU] via SUBCUTANEOUS
  Filled 2021-01-11: qty 1

## 2021-01-11 MED ORDER — HYDRALAZINE HCL 25 MG PO TABS
25.0000 mg | ORAL_TABLET | Freq: Three times a day (TID) | ORAL | Status: DC
  Administered 2021-01-11 – 2021-01-13 (×4): 25 mg via ORAL
  Filled 2021-01-11 (×4): qty 1

## 2021-01-11 MED ORDER — CALCITRIOL 0.25 MCG PO CAPS: 0.2500 ug | ORAL_CAPSULE | Freq: Every day | ORAL | Status: DC

## 2021-01-11 MED ORDER — METOPROLOL TARTRATE 50 MG PO TABS
50.0000 mg | ORAL_TABLET | Freq: Two times a day (BID) | ORAL | Status: DC
  Administered 2021-01-11 – 2021-01-15 (×8): 50 mg via ORAL
  Filled 2021-01-11 (×8): qty 1

## 2021-01-11 MED ORDER — CHLORHEXIDINE GLUCONATE CLOTH 2 % EX PADS
6.0000 | MEDICATED_PAD | Freq: Every day | CUTANEOUS | Status: DC
  Administered 2021-01-12 – 2021-01-15 (×4): 6 via TOPICAL

## 2021-01-11 MED ORDER — SODIUM BICARBONATE 650 MG PO TABS
650.0000 mg | ORAL_TABLET | Freq: Two times a day (BID) | ORAL | Status: DC
  Administered 2021-01-11 – 2021-01-15 (×8): 650 mg via ORAL
  Filled 2021-01-11 (×9): qty 1

## 2021-01-11 MED ORDER — CALCITRIOL 0.25 MCG PO CAPS
0.5000 ug | ORAL_CAPSULE | Freq: Every day | ORAL | Status: DC
  Administered 2021-01-12 – 2021-01-15 (×4): 0.5 ug via ORAL
  Filled 2021-01-11 (×4): qty 2

## 2021-01-11 NOTE — ED Notes (Signed)
Pt CBG 414 - Dr. Posey Pronto notified via secure chat and paged via [facility]

## 2021-01-11 NOTE — ED Notes (Addendum)
Per Dr. Posey Pronto DC novolog and restart pt on her pump - pt pump adjusts the rate for her based on what her CBG is. With a cbg of 414 pump gives 5.3 units of insulin. Pt can give herself half of that. Per Dr. Posey Pronto tell pt to give herself half and then keep OJ and crackers at bedside

## 2021-01-11 NOTE — Progress Notes (Signed)
Inpatient Diabetes Program Recommendations  AACE/ADA: New Consensus Statement on Inpatient Glycemic Control (2015)  Target Ranges:  Prepandial:   less than 140 mg/dL      Peak postprandial:   less than 180 mg/dL (1-2 hours)      Critically ill patients:  140 - 180 mg/dL   Lab Results  Component Value Date   GLUCAP 167 (H) 01/11/2021   HGBA1C 7.4 (H) 01/03/2021    Review of Glycemic Control Results for Kimberly [name], Kimberly [name] (MRN [id number]) as of 01/11/2021 13:24  Ref. Range 01/11/2021 11:56 01/11/2021 12:28  Glucose-Capillary Latest Ref Range: 70 - 99 mg/dL 32 (LL) 167 (H)   Diabetes history: DM1; makes NO insulin, requires basal, correction, and carb coverage insulin Outpatient Diabetes medications: Medtronic insulin pump with Humalog Current orders for Inpatient glycemic control:None  Inpatient Diabetes Program Recommendations: When CBGs stable, consider: Lantus 12 units qd Novolog 3 units tid meal coverage when patient is eating 50% meals Novolog 0-9 units q 4 hrs. While NPO then tid + Novolog 0-5 units hs  Patient in emergency room with hypoglycemia. Patient was just discharged from the hospital 01/03/21. Patient's insulin pump per RN Babs Bertin is now off of patient. Perprevious Epic note, patient sees endocrinologist Dr. Chalmers Cater with last documented pump settings of:  Basal rates 0.625 units/ hr 1 unit/10grams of CHO 1 unit drops blood sugars approximately 75 mg/dL- Goal =150 mg/dL  Thank you, Nani Gasser. Lashonne Shull, RN, MSN, CDE  Diabetes Coordinator Inpatient Glycemic Control Team Team Pager [phone] (8am-5pm) 01/11/2021 1:34 PM

## 2021-01-11 NOTE — ED Provider Notes (Signed)
[facility]ERGENCY DEPARTMENT Provider Note   CSN: [id number] Arrival date & time: 01/11/21  1023     History Chief Complaint  Patient presents with  . Altered Mental Status  . Weakness    Kimberly [name] is a 30 y.o. female.  Presented to ER with concern for altered mental status, generalized weakness.  History limited due to patient's mental status change.  Level 5 caveat.  Completed chart review, recent admission for anemia, CKD, hyperkalemia.  History of type 1 diabetes on insulin pump.  Additional history obtained later after patient was more responsive, states that she has been doing okay over the weekend, no acute complaints today.  States that she has had generally poor appetite, did not eat breakfast this morning.  States that on Friday when she saw her nephrologist, she was told to come to ER to check in on Monday for initiation of dialysis.  HPI     Past Medical History:  Diagnosis Date  . Diabetes mellitus without complication ([place])   . Preeclampsia   . Preeclampsia without severe features   . Sickle cell trait [facility])     Patient Active Problem List   Diagnosis Date Noted  . ESRD (end stage renal disease) ([place]) 01/11/2021  . Hypoglycemia due to insulin 01/11/2021  . Chest pain 01/02/2021  . Diabetic ketoacidosis associated with type 1 diabetes mellitus ([place]) 09/29/2020  . CKD (chronic kidney disease) stage 4, GFR 15-29 ml/min (HCC) 08/07/2020  . Anemia in chronic kidney disease 08/07/2020  . Overweight (BMI 25.0-29.9) 11/30/2019  . Acute lower UTI 11/29/2019  . Acute renal failure superimposed on stage 4 chronic kidney disease ([place]) 11/29/2019  . Hypertension associated with diabetes ([place]) 11/29/2019  . Hypokalemia 09/06/2018  . Hypertensive urgency 09/06/2018  . Leukocytosis 09/06/2018  . GERD (gastroesophageal reflux disease) 09/06/2018  . Gastroparesis   . Intractable nausea and vomiting 08/29/2018  . Microcytic anemia  08/29/2018  . CIN III (cervical intraepithelial neoplasia grade III) with severe dysplasia 09/09/2017  . DKA, type 1 ([place]) 03/17/2015  . Diabetic nephropathy associated with type 1 diabetes mellitus ([place]) 03/17/2015  . Type 1 diabetes mellitus with diabetic nephropathy ([place]) 05/25/2013    Past Surgical History:  Procedure Laterality Date  . ESOPHAGOGASTRODUODENOSCOPY  08/2018   [facility] [place] and a gastric enptying study   . WISDOM TOOTH EXTRACTION       OB History    Gravida  1   Para  1   Term      Preterm  1   AB      Living  1     SAB      IAB      Ectopic      Multiple      Live Births  1           Family History  Problem Relation Age of Onset  . Lupus Mother   . Aneurysm Mother   . Colon polyps Maternal Aunt        great aunt  . Colon cancer Neg Hx   . Esophageal cancer Neg Hx     Social History   Tobacco Use  . Smoking status: Never Smoker  . Smokeless tobacco: Never Used  Vaping Use  . Vaping Use: Never used  Substance Use Topics  . Alcohol use: No  . Drug use: No    Home Medications Prior to Admission medications   Medication Sig Start Date End Date Taking? Authorizing  Provider  amLODipine (NORVASC) 5 MG tablet Take 5 mg by mouth daily. 09/23/20  Yes [provider]  BAYER CONTOUR NEXT TEST test strip USE AS DIRECTED TO TEST 6 TO 8 TIMES DAILY Patient taking differently: 1 each by Other route. 10/01/14  Yes Woodroe Mode, MD  Blood Glucose Monitoring Suppl (CONTOUR NEXT EZ MONITOR) W/DEVICE KIT 1 each by Does not apply route 6 (six) times daily. Provide lancets for testing six times daily. Type I DM with diabetes on Insulin Pump  DX 648.03 06/17/13  Yes Woodroe Mode, MD  calcitRIOL (ROCALTROL) 0.25 MCG capsule Take 0.25 mcg by mouth daily. 12/08/20  Yes [provider]  calcium carbonate (TUMS - DOSED IN MG ELEMENTAL CALCIUM) 500 MG chewable tablet Chew 2 tablets by mouth 3 (three) times daily.   Yes [provider]  cyanocobalamin (,VITAMIN B-12,) 1000 MCG/ML injection Inject 1 mL into the muscle every 30 (thirty) days. 06/03/20  Yes [provider]  etonogestrel (NEXPLANON) 68 MG IMPL implant Inject 1 each into the skin once.   Yes [provider]  furosemide (LASIX) 40 MG tablet Take 1 tablet (40 mg total) by mouth daily. 01/03/21 07/02/21 Yes Mercy Riding, MD  hydrALAZINE (APRESOLINE) 25 MG tablet Take 25 mg by mouth 3 (three) times daily. 09/23/20  Yes [provider]  Insulin Human (INSULIN PUMP) SOLN Inject into the skin. Humalog   Yes [provider]  insulin lispro (HUMALOG) 100 UNIT/ML injection 0-9 Units TID and 0-5 units at bedside as per sliding scale Patient taking differently: Inject 0-6 Units into the skin See admin instructions. 0-6 Units three times a day per sliding scale; Via insulin pump; INJECT 100 UNITS MAX DAILY DOSEAGE VIA INSULIN PUMP UNDER THE SKIN. 09/30/20 09/30/21 Yes Pokhrel, Laxman, MD  metoprolol tartrate (LOPRESSOR) 50 MG tablet Take 1 tablet (50 mg total) by mouth 2 (two) times daily. 03/23/18  Yes Patrecia Pour, Christean Grief, MD  sodium bicarbonate 650 MG tablet Take 650 mg by mouth 2 (two) times daily. 07/15/20  Yes [provider]    Allergies    Metoclopramide  Review of Systems   Review of Systems  Constitutional: Positive for fatigue.    Physical Exam Updated Vital Signs BP 135/86   Pulse 73   Temp (!) 97.5 F (36.4 C) (Oral)   Resp 15   Ht 5' 5"  (1.651 m)   Wt 71.6 kg   LMP 01/08/2021   SpO2 100%   BMI 26.27 kg/m   Physical Exam Vitals and nursing note reviewed.  Constitutional:      General: She is not in acute distress.    Appearance: She is well-developed.     Comments: Lethargic, arouses to voice, answers basic questions appropriately but does not provide complex historical details  HENT:     Head: Normocephalic and atraumatic.  Eyes:     Conjunctiva/sclera: Conjunctivae normal.  Cardiovascular:      Rate and Rhythm: Normal rate and regular rhythm.     Heart sounds: No murmur heard.   Pulmonary:     Effort: Pulmonary effort is normal. No respiratory distress.     Breath sounds: Normal breath sounds.  Abdominal:     Palpations: Abdomen is soft.     Tenderness: There is no abdominal tenderness.  Musculoskeletal:        General: No deformity or signs of injury.     Cervical back: Neck supple.  Skin:    General: Skin is  warm and dry.  Neurological:     Mental Status: She is alert.     Comments: Lethargic but readily arouses to voice     ED Results / Procedures / Treatments   Labs (all labs ordered are listed, but only abnormal results are displayed) Labs Reviewed  COMPREHENSIVE METABOLIC PANEL - Abnormal; Notable for the following components:      Result Value   Potassium 3.2 (*)    CO2 19 (*)    Glucose, Bld 44 (*)    BUN 61 (*)    Creatinine, Ser 6.97 (*)    Calcium 6.1 (*)    Albumin 3.1 (*)    AST 10 (*)    GFR, Estimated 7 (*)    All other components within normal limits  CBC WITH DIFFERENTIAL/PLATELET - Abnormal; Notable for the following components:   Hemoglobin 10.3 (*)    HCT 30.8 (*)    MCV 78.6 (*)    All other components within normal limits  CBG MONITORING, ED - Abnormal; Notable for the following components:   Glucose-Capillary 32 (*)    All other components within normal limits  CBG MONITORING, ED - Abnormal; Notable for the following components:   Glucose-Capillary 167 (*)    All other components within normal limits  CBG MONITORING, ED - Abnormal; Notable for the following components:   Glucose-Capillary 240 (*)    All other components within normal limits  RESP PANEL BY RT-PCR (FLU A&B, COVID) ARPGX2  MAGNESIUM  PROTIME-INR  URINALYSIS, ROUTINE W REFLEX MICROSCOPIC  RAPID URINE DRUG SCREEN, HOSP PERFORMED  PARATHYROID HORMONE, INTACT (NO CA)  PHOSPHORUS  IRON AND TIBC  FERRITIN  I-STAT BETA HCG BLOOD, ED ([facility], [facility], [facility] ONLY)  CBG  MONITORING, ED  CBG MONITORING, ED  CBG MONITORING, ED  CBG MONITORING, ED  TYPE AND SCREEN    EKG EKG Interpretation  Date/Time:  Monday Jan 11 2021 12:11:35 EDT Ventricular Rate:  65 PR Interval:  150 QRS Duration: 110 QT Interval:  556 QTC Calculation: 579 R Axis:   27 Text Interpretation: Sinus rhythm Probable left atrial enlargement Borderline repolarization abnormality Prolonged QT interval Confirmed by Madalyn Rob [phone]) on 01/11/2021 1:02:32 PM   Radiology DG Chest Portable 1 View  Result Date: 01/11/2021 CLINICAL DATA:  Abnormal breath sounds. Altered mental status and weakness. EXAM: PORTABLE CHEST 1 VIEW COMPARISON:  01/02/2021. FINDINGS: Trachea is midline. Heart is enlarged, stable. Lungs are somewhat low in volume but clear. No pleural fluid. IMPRESSION: No acute findings. Electronically Signed   By: Lorin Picket M.D.   On: 01/11/2021 13:40    Procedures Procedures   Medications Ordered in ED Medications  amLODipine (NORVASC) tablet 5 mg (has no administration in time range)  calcium carbonate (TUMS - dosed in mg elemental calcium) chewable tablet 400 mg of elemental calcium (has no administration in time range)  hydrALAZINE (APRESOLINE) tablet 25 mg (has no administration in time range)  sodium bicarbonate tablet 650 mg (has no administration in time range)  metoprolol tartrate (LOPRESSOR) tablet 50 mg (has no administration in time range)  insulin aspart (novoLOG) injection 0-9 Units (has no administration in time range)  heparin injection 5,000 Units (0 Units Subcutaneous Hold 01/12/21 0600)  calcium gluconate 2 g/ 100 mL sodium chloride IVPB (has no administration in time range)  calcitRIOL (ROCALTROL) capsule 0.5 mcg (has no administration in time range)  Chlorhexidine Gluconate Cloth 2 % PADS 6 each (has no administration in time range)  dextrose 50 %  solution 50 mL (50 mLs Intravenous Given 01/11/21 1210)    ED Course  I have reviewed the triage  vital signs and the nursing notes.  Pertinent labs & imaging results that were available during my care of the patient were reviewed by me and considered in my medical decision making (see chart for details).  Clinical Course as of 01/11/21 1638  Mon Jan 11, 2021  1322 Johnney Ou [RD]  1335 D/w Jonnie Finner - does not see indication to admit for dialysis - he will review her chart and call back with final recs [RD]    Clinical Course User Index [RD] Lucrezia Starch, MD   MDM Rules/Calculators/A&P                         30 year old lady with history of diabetes, CKD, ACD, hypertension presents to ER with concern for generalized weakness.  When I evaluated patient, she was lethargic but arousable.  Glucose significantly low.  Given p.o. and D50.  Her mental status quickly improved.  Labs are concerning for significant chronic kidney disease but not significantly worse than when she was recently admitted.  Hemoglobin is stable.  No further episodes of hypoglycemia.  I discussed the case with Dr. Burnett Sheng who then discussed with Dr. Osborne Casco.  Dr. Osborne Casco states he recommends patient be admitted to the hospitalist service for coordination of initiation of dialysis.  He states he will reach out to IR to discuss access. D/w Posey Pronto who will admit.   Final Clinical Impression(s) / ED Diagnoses Final diagnoses:  Chronic kidney disease, unspecified CKD stage  Hypoglycemia    Rx / DC Orders ED Discharge Orders    None       Lucrezia Starch, MD 01/11/21 [phone]

## 2021-01-11 NOTE — ED Notes (Addendum)
Patients insulin pump reading to give 4.9 units of insulin. Patient pump to give half 2.45 units of insulin.

## 2021-01-11 NOTE — ED Triage Notes (Signed)
Pt reports being sent here by nephrology, having generalized fatigue and weakness.

## 2021-01-11 NOTE — Progress Notes (Signed)
Patient's cbg 227, patient's own insulin pump reading 2.6 units as dose recomendation. Patient has given her self 1.3 units at this time. Will continue to monitor patient.

## 2021-01-11 NOTE — Consult Note (Signed)
Nephrology Consult   Requesting provider: Madalyn Rob, MD Service requesting consult: ER Reason for consult: ESRD   Assessment/Recommendations: Kimberly [name] is a/an 30 y.o. female with a past medical history CKD 5, DM1, HTN  who present w/ new ESRD  CKD 5 now considered ESRD: Progressive renal failure likely diabetic kidney disease now progressed to ESRD needing to start dialysis -IR consulted for [facility]; n.p.o. at midnight.  Appreciate help -No emergent indication for dialysis plan to start tomorrow -Plan for daily dialysis for 3 days straight starting tomorrow -Involve renal coordinator and education team tomorrow -Consult VVS tomorrow  Volume Status: Appears acceptable.  Hold home Lasix  Hypertension: Continue hydralazine and amlodipine as ordered  Hypoglycemia: Likely related to renal failure and insulin excess.  Continue to monitor while inpatient.  Anemia: Hemoglobin stable at 10.3.  Recently significantly decreased.  Check iron stores.  Consider ESA.  Hypocalcemia/secondary hyperparathyroidism: Likely related to secondary hyperparathyroidism with metabolic bone disease secondary to CKD.  Increase calcitriol to 0.5 MCG daily.  Obtain PTH.  Follow-up phosphorus.  2 g of calcium gluconate now  Type 1 diabetes: Management per primary team.  Uncontrolled with hyperglycemia as above   Recommendations conveyed to primary service.    [facility] 01/11/2021 4:27 PM   _____________________________________________________________________________________ CC: ESRD  History of Present Illness: Kimberly [name] is a/an 30 y.o. female with a past medical history of CKD 5, DM1, HTN who presents with chronic uremia.  The patient presented to the hospital today at the request of her outside nephrologist, Dr. Johnney Ou.  Has a longstanding history of type 1 diabetes that has been hard to control.  She presented to the hospital about a week ago with  chest pain and shortness of breath.  She was found to have hyperkalemia and a hemoglobin of 4.  Patient received Lokelma and hyperkalemia resolved.  Her creatinine was higher than baseline and improved with fluids as well as transfusions.  Patient improved throughout the hospitalization and was discharged.  However, patient called Dr. Johnney Ou and after discussions regarding her ongoing symptoms of mild uremia with likely nausea and poor p.o. intake it was felt she would benefit from starting dialysis.  It was not felt that this could occur outpatient as her insurance refused to cover the cost of dialysis initiation on the outpatient side.  Therefore it was recommended that she come to the hospital.  She does have some interest in peritoneal dialysis as well as transplant in the future.  The plan is to start intermittent dialysis for now.  On arrival to the emergency department she was somnolent and found to have hypoglycemia.  Her mental status improved with dextrose.  She has not been having hypoglycemia at home per patient.  She has an insulin pump.   Medications:  Current Facility-Administered Medications  Medication Dose Route Frequency Provider Last Rate Last Admin  . [START ON 01/12/2021] amLODipine (NORVASC) tablet 5 mg  5 mg Oral Daily Para Skeans, MD      . Derrill Memo ON 01/12/2021] calcitRIOL (ROCALTROL) capsule 0.25 mcg  0.25 mcg Oral Daily Florina Ou V, MD      . calcium carbonate (TUMS - dosed in mg elemental calcium) chewable tablet 400 mg of elemental calcium  2 tablet Oral TID Para Skeans, MD      . Derrill Memo ON 01/12/2021] furosemide (LASIX) tablet 40 mg  40 mg Oral Daily Para Skeans, MD      . heparin  injection 5,000 Units  5,000 Units Subcutaneous Q8H Florina Ou V, MD      . hydrALAZINE (APRESOLINE) tablet 25 mg  25 mg Oral TID Para Skeans, MD      . insulin aspart (novoLOG) injection 0-9 Units  0-9 Units Subcutaneous TID WC Florina Ou V, MD      . metoprolol tartrate (LOPRESSOR)  tablet 50 mg  50 mg Oral BID Florina Ou V, MD      . sodium bicarbonate tablet 650 mg  650 mg Oral BID Para Skeans, MD       Current Outpatient Medications  Medication Sig Dispense Refill  . amLODipine (NORVASC) 5 MG tablet Take 5 mg by mouth daily.    Marland Kitchen BAYER CONTOUR NEXT TEST test strip USE AS DIRECTED TO TEST 6 TO 8 TIMES DAILY (Patient taking differently: 1 each by Other route.) 1 each 2  . Blood Glucose Monitoring Suppl (CONTOUR NEXT EZ MONITOR) W/DEVICE KIT 1 each by Does not apply route 6 (six) times daily. Provide lancets for testing six times daily. Type I DM with diabetes on Insulin Pump  DX 648.03 1 kit 6  . calcitRIOL (ROCALTROL) 0.25 MCG capsule Take 0.25 mcg by mouth daily.    . calcium carbonate (TUMS - DOSED IN MG ELEMENTAL CALCIUM) 500 MG chewable tablet Chew 2 tablets by mouth 3 (three) times daily.    . cyanocobalamin (,VITAMIN B-12,) 1000 MCG/ML injection Inject 1 mL into the muscle every 30 (thirty) days.    Marland Kitchen etonogestrel (NEXPLANON) 68 MG IMPL implant Inject 1 each into the skin once.    . furosemide (LASIX) 40 MG tablet Take 1 tablet (40 mg total) by mouth daily. 90 tablet 1  . hydrALAZINE (APRESOLINE) 25 MG tablet Take 25 mg by mouth 3 (three) times daily.    . Insulin Human (INSULIN PUMP) SOLN Inject into the skin. Humalog    . insulin lispro (HUMALOG) 100 UNIT/ML injection 0-9 Units TID and 0-5 units at bedside as per sliding scale (Patient taking differently: Inject 0-6 Units into the skin See admin instructions. 0-6 Units three times a day per sliding scale; Via insulin pump; INJECT 100 UNITS MAX DAILY DOSEAGE VIA INSULIN PUMP UNDER THE SKIN.) 10 mL 3  . metoprolol tartrate (LOPRESSOR) 50 MG tablet Take 1 tablet (50 mg total) by mouth 2 (two) times daily. 60 tablet 0  . sodium bicarbonate 650 MG tablet Take 650 mg by mouth 2 (two) times daily.       ALLERGIES Metoclopramide  MEDICAL HISTORY Past Medical History:  Diagnosis Date  . Diabetes mellitus without  complication ([place])   . Preeclampsia   . Preeclampsia without severe features   . Sickle cell trait ([place])      SOCIAL HISTORY Social History   Socioeconomic History  . Marital status: Single    Spouse name: n/a  . Number of children: 0  . Years of education: college  . Highest education level: Not on file  Occupational History  . Occupation: Stage manager: [employer]  Tobacco Use  . Smoking status: Never Smoker  . Smokeless tobacco: Never Used  Vaping Use  . Vaping Use: Never used  Substance and Sexual Activity  . Alcohol use: No  . Drug use: No  . Sexual activity: Yes    Partners: Male    Birth control/protection: Condom  Other Topics Concern  . Not on file  Social History Narrative   Lives alone.  Her father lives in [place], her brother in [place].  Student at [employer].   Social Determinants of Health   Financial Resource Strain: Not on file  Food Insecurity: Not on file  Transportation Needs: Not on file  Physical Activity: Not on file  Stress: Not on file  Social Connections: Not on file  Intimate Partner Violence: Not on file     FAMILY HISTORY Family History  Problem Relation Age of Onset  . Lupus Mother   . Aneurysm Mother   . Colon polyps Maternal Aunt        great aunt  . Colon cancer Neg Hx   . Esophageal cancer Neg Hx       Review of Systems: 12 systems reviewed Otherwise as per HPI, all other systems reviewed and negative  Physical Exam: Vitals:   01/11/21 1500 01/11/21 1530  BP: (!) 154/97 135/86  Pulse: 71 73  Resp: (!) 21 15  Temp:    SpO2: 100% 100%   No intake/output data recorded. No intake or output data in the 24 hours ending 01/11/21 1627 General: well-appearing, no acute distress HEENT: anicteric sclera, oropharynx clear without lesions CV: Normal rate, no audible murmurs Lungs: clear to auscultation bilaterally, normal work of breathing Abd: soft, non-tender,  non-distended Skin: no visible lesions or rashes Psych: alert, engaged, appropriate mood and affect Musculoskeletal: no obvious deformities Neuro: normal speech, no gross focal deficits   Test Results Reviewed Lab Results  Component Value Date   NA 140 01/11/2021   K 3.2 (L) 01/11/2021   CL 108 01/11/2021   CO2 19 (L) 01/11/2021   BUN 61 (H) 01/11/2021   CREATININE 6.97 (H) 01/11/2021   CALCIUM 6.1 (LL) 01/11/2021   ALBUMIN 3.1 (L) 01/11/2021   PHOS 6.2 (H) 01/03/2021     I have reviewed all relevant outside healthcare records related to the patient's current hospitalization

## 2021-01-11 NOTE — ED Provider Notes (Signed)
Emergency Medicine Provider Triage Evaluation Note  Kimberly [name] , a 30 y.o. female  was evaluated in triage.  Pt complains of coming for labs, per urology. Patient with AMS in triage, somnolent, slurring speech. CBG 32 - 1 amp D50 ordered. Patient with DM type 1.   Review of Systems  Positive: AMS, BLE edema Negative: ROS difficult to complete given patient's mental status  Physical Exam  BP 132/89 (BP Location: Right Arm)   Pulse 68   Temp (!) 97.5 F (36.4 C) (Oral)   Resp 16   SpO2 100%  Gen:   Somnolent, no distress   Resp:  Normal effort  MSK:   Moves extremities without difficulty  Other:  AMS   Medical Decision Making  Medically screening exam initiated at 11:52 AM.  Appropriate orders placed.  Kimberly [name] was informed that the remainder of the evaluation will be completed by another provider, this initial triage assessment does not replace that evaluation, and the importance of remaining in the ED until their evaluation is complete.  Patient to be next for room, charge nurse notified, basic labs ordered.   This chart was dictated using voice recognition software, Dragon. Despite the best efforts of this provider to proofread and correct errors, errors may still occur which can change documentation meaning.    Emeline Darling, PA-C 01/11/21 1200    Lucrezia Starch, MD 01/12/21 [phone]

## 2021-01-11 NOTE — H&P (Signed)
History and Physical    Kimberly [name]:[id number] DOB: 05-29-1991 DOA: 01/11/2021  PCP: Jacelyn Pi, MD    Patient coming from:  Nephrology   Chief Complaint:  Abnormal renal function  HPI: Kimberly [name] is a 30 y.o. female seen in ed with complaints of hypoglycemia s/p  1 amp of D50.  Patient has a history of type 1 diabetes managed on insulin pump.pt went to see her nephrologist on Friday and was told to go to hospital and pt came on Monday.  Pt states she has not been sob or chest pain.   Pt has past medical history of chronic kidney disease, anemia, hyperkalemia, type 1 diabetes mellitus on insulin pump, sickle cell trait, preeclampsia, DKA, GERD.  ED Course:  Vitals:   01/11/21 1400 01/11/21 1430 01/11/21 1500 01/11/21 1530  BP: 130/86 (!) 142/90 (!) 154/97 135/86  Pulse: 70 71 71 73  Resp: 13 16 (!) 21 15  Temp:      TempSrc:      SpO2: 100% 100% 100% 100%  Weight:      Height:      In ed pt is alert and oriented. Labs show: mild hypokalemia/ 6.1 creatinine / and normal lft.  Cbc shows hb o f 10.3 and o/w normal. Review of Systems:  Review of Systems  Constitutional: Negative.   Eyes: Negative.   Respiratory: Negative.   Cardiovascular: Negative.   Gastrointestinal: Negative for abdominal pain, blood in stool, constipation, diarrhea and vomiting.  Genitourinary: Negative.   Musculoskeletal: Negative.   Neurological: Negative.   Endo/Heme/Allergies: Negative.        Except for hypoglycemia.  All other systems reviewed and are negative.  Past Medical History:  Diagnosis Date  . Diabetes mellitus without complication ([place])   . Preeclampsia   . Preeclampsia without severe features   . Sickle cell trait [facility])     Past Surgical History:  Procedure Laterality Date  . ESOPHAGOGASTRODUODENOSCOPY  08/2018   [facility] and a gastric enptying study   . WISDOM TOOTH EXTRACTION       reports that she has never smoked. She has never used  smokeless tobacco. She reports that she does not drink alcohol and does not use drugs.  Allergies  Allergen Reactions  . Metoclopramide Other (See Comments)    tardive dyskinesia per outside provider note Other reaction(s): Other (See Comments) ta disconsia    Family History  Problem Relation Age of Onset  . Lupus Mother   . Aneurysm Mother   . Colon polyps Maternal Aunt        great aunt  . Colon cancer Neg Hx   . Esophageal cancer Neg Hx     Prior to Admission medications   Medication Sig Start Date End Date Taking? Authorizing Provider  amLODipine (NORVASC) 5 MG tablet Take 5 mg by mouth daily. 09/23/20   [provider]  BAYER CONTOUR NEXT TEST test strip USE AS DIRECTED TO TEST 6 TO 8 TIMES DAILY Patient taking differently: 1 each by Other route. 10/01/14   Woodroe Mode, MD  Blood Glucose Monitoring Suppl (CONTOUR NEXT EZ MONITOR) W/DEVICE KIT 1 each by Does not apply route 6 (six) times daily. Provide lancets for testing six times daily. Type I DM with diabetes on Insulin Pump  DX 648.03 06/17/13   Woodroe Mode, MD  calcitRIOL (ROCALTROL) 0.25 MCG capsule Take 0.25 mcg by mouth daily. 12/08/20   [provider]  calcium carbonate (TUMS -  DOSED IN MG ELEMENTAL CALCIUM) 500 MG chewable tablet Chew 2 tablets by mouth 3 (three) times daily.    [provider]  cyanocobalamin (,VITAMIN B-12,) 1000 MCG/ML injection Inject 1 mL into the muscle every 30 (thirty) days. 06/03/20   [provider]  etonogestrel (NEXPLANON) 68 MG IMPL implant Inject 1 each into the skin once.    [provider]  furosemide (LASIX) 40 MG tablet Take 1 tablet (40 mg total) by mouth daily. 01/03/21 07/02/21  Mercy Riding, MD  hydrALAZINE (APRESOLINE) 25 MG tablet Take 25 mg by mouth 3 (three) times daily. 09/23/20   [provider]  Insulin Human (INSULIN PUMP) SOLN Inject into the skin. Humalog    [provider]  insulin lispro (HUMALOG) 100  UNIT/ML injection Inject 3-6 Units into the skin 3 (three) times daily before meals. Via insulin pump; INJECT 100 UNITS MAX DAILY DOSEAGE VIA INSULIN PUMP UNDER THE SKIN    [provider]  insulin lispro (HUMALOG) 100 UNIT/ML injection 0-9 Units TID and 0-5 units at bedside as per sliding scale 09/30/20 09/30/21  Pokhrel, Corrie Mckusick, MD  metoprolol tartrate (LOPRESSOR) 50 MG tablet Take 1 tablet (50 mg total) by mouth 2 (two) times daily. 03/23/18   Doreatha Lew, MD  sodium bicarbonate 650 MG tablet Take 650 mg by mouth 2 (two) times daily. 07/15/20   [provider]    Physical Exam: Vitals:   01/11/21 1400 01/11/21 1430 01/11/21 1500 01/11/21 1530  BP: 130/86 (!) 142/90 (!) 154/97 135/86  Pulse: 70 71 71 73  Resp: 13 16 (!) 21 15  Temp:      TempSrc:      SpO2: 100% 100% 100% 100%  Weight:      Height:       Physical Exam Vitals and nursing note reviewed.  Constitutional:      General: She is not in acute distress.    Appearance: Normal appearance. She is not ill-appearing, toxic-appearing or diaphoretic.  HENT:     Head: Normocephalic and atraumatic.     Right Ear: External ear normal.     Left Ear: External ear normal.     Nose: Nose normal.     Mouth/Throat:     Mouth: Mucous membranes are moist.  Eyes:     Extraocular Movements: Extraocular movements intact.     Pupils: Pupils are equal, round, and reactive to light.  Cardiovascular:     Rate and Rhythm: Normal rate and regular rhythm.     Pulses: Normal pulses.     Heart sounds: Normal heart sounds.  Pulmonary:     Effort: Pulmonary effort is normal.     Breath sounds: Normal breath sounds.  Abdominal:     General: Bowel sounds are normal. There is no distension.     Palpations: Abdomen is soft. There is no mass.     Tenderness: There is no abdominal tenderness. There is no guarding.     Hernia: No hernia is present.  Musculoskeletal:     Right lower leg: Edema present.     Left lower leg: Edema  present.  Skin:    General: Skin is warm.  Neurological:     General: No focal deficit present.     Mental Status: She is alert and oriented to person, place, and time.  Psychiatric:        Mood and Affect: Mood normal.        Behavior: Behavior normal.  Labs on Admission: I have personally reviewed following labs and imaging studies  No results for input(s): CKTOTAL, CKMB, TROPONINI in the last 72 hours. Lab Results  Component Value Date   WBC 8.9 01/11/2021   HGB 10.3 (L) 01/11/2021   HCT 30.8 (L) 01/11/2021   MCV 78.6 (L) 01/11/2021   PLT 232 01/11/2021    Recent Labs  Lab 01/11/21 1153  NA 140  K 3.2*  CL 108  CO2 19*  BUN 61*  CREATININE 6.97*  CALCIUM 6.1*  PROT 6.9  BILITOT 0.4  ALKPHOS 91  ALT 10  AST 10*  GLUCOSE 44*   Lab Results  Component Value Date   CHOL 132 01/03/2021   HDL 64 01/03/2021   LDLCALC 54 01/03/2021   TRIG 68 01/03/2021   Urinalysis    Component Value Date/Time   COLORURINE STRAW (A) 01/02/2021 1807   APPEARANCEUR HAZY (A) 01/02/2021 1807   LABSPEC 1.009 01/02/2021 1807   PHURINE 5.0 01/02/2021 1807   GLUCOSEU >=500 (A) 01/02/2021 1807   HGBUR SMALL (A) 01/02/2021 1807   BILIRUBINUR NEGATIVE 01/02/2021 1807   KETONESUR 5 (A) 01/02/2021 1807   PROTEINUR >=300 (A) 01/02/2021 1807   UROBILINOGEN 0.2 03/21/2015 2010   NITRITE NEGATIVE 01/02/2021 1807   LEUKOCYTESUR NEGATIVE 01/02/2021 1807   COVID-19 Labs  No results for input(s): DDIMER, FERRITIN, LDH, CRP in the last 72 hours.  Lab Results  Component Value Date   SARSCOV2NAA NEGATIVE 01/02/2021   [place] NEGATIVE 09/29/2020   [place] NEGATIVE 06/03/2020   [place] NEGATIVE 11/29/2019    Radiological Exams on Admission: DG Chest Portable 1 View  Result Date: 01/11/2021 CLINICAL DATA:  Abnormal breath sounds. Altered mental status and weakness. EXAM: PORTABLE CHEST 1 VIEW COMPARISON:  01/02/2021. FINDINGS: Trachea is midline. Heart is enlarged, stable.  Lungs are somewhat low in volume but clear. No pleural fluid. IMPRESSION: No acute findings. Electronically Signed   By: Lorin Picket M.D.   On: 01/11/2021 13:40    EKG: Independently reviewed.  Sinus rhythm 65, left atrial enlargement, PR 150, prolonged QTC at 579.  Echocardiogram 01/03/2021: IMPRESSIONS  1. Left ventricular ejection fraction, by estimation, is 55 to 60%. The  left ventricle has normal function. The left ventricle has no regional  wall motion abnormalities. There is moderate left ventricular hypertrophy.  Left ventricular diastolic  parameters are consistent with Grade II diastolic dysfunction  (pseudonormalization). The average left ventricular global longitudinal  strain is -13.4 %. The global longitudinal strain is abnormal.  2. Right ventricular systolic function is normal. The right ventricular  size is mildly enlarged. There is normal pulmonary artery systolic  pressure. The estimated right ventricular systolic pressure is 86.7 mmHg.  3. Left atrial size was mildly dilated.  4. The mitral valve is normal in structure. Mild to moderate mitral valve  regurgitation. No evidence of mitral stenosis.  5. Tricuspid valve regurgitation is mild to moderate.  6. The aortic valve is tricuspid. Aortic valve regurgitation is trivial.  No aortic stenosis is present.  7. The inferior vena cava is dilated in size with >50% respiratory  variability, suggesting right atrial pressure of 8 mmHg.   Assessment/Plan Principal Problem:   ESRD (end stage renal disease) ([place]) Active Problems:   Type 1 diabetes mellitus with diabetic nephropathy (H[facility])   GERD (gastroesophageal reflux disease)   Hypertension associated with diabetes ([place])   Hypoglycemia due to insulin   ESRD: Pt seen in ed and plan is for admission for evaluation of  HD catheter. Nephrology has been consulted and they will consult vascular per edmd.  Avoid nephrotoxic meds. Renally dose all meds. Cont  calcitriol, lasix, hydralazine,   DM I: PRN SSI/ glycemic protocol. Resume insulin pump.  GERD:  iv ppi.  HTN: Resume home regimen and continue hydralazine and lasix.    Hypoglycemia: Cont with acucheck twice a twice a day and with insulin.    DVT prophylaxis:  Heparin  Code Status:  Full code  Family Communication:  Antonela, Freiman (Father)  [phone] ([place])  Disposition Plan:  Home.  Consults called:  Nephrology Dr. Janan Halter.  Admission status: Inpatient.   Para Skeans MD [facility] [phone] How to contact the [facility] Attending or Consulting provider [place] or covering provider during after hours [place], for this patient.    1. Check the care team in [facility] and look for a) attending/consulting [place] provider listed and b) the [place] team listed 2. Log into [email or web address] and use [place]'s universal password to access. If you do not have the password, please contact the hospital operator. 3. Locate the [facility] provider you are looking for under [facility] and page to a number that you can be directly reached. 4. If you still have difficulty reaching the provider, please page the [facility] (Director on Call) for the Hospitalists listed on amion for assistance. [email or web address] Password [facility] 01/11/2021, 4:14 PM

## 2021-01-12 ENCOUNTER — Inpatient Hospital Stay (HOSPITAL_COMMUNITY): Payer: No Typology Code available for payment source

## 2021-01-12 DIAGNOSIS — N185 Chronic kidney disease, stage 5: Secondary | ICD-10-CM

## 2021-01-12 DIAGNOSIS — E1021 Type 1 diabetes mellitus with diabetic nephropathy: Secondary | ICD-10-CM

## 2021-01-12 DIAGNOSIS — N186 End stage renal disease: Secondary | ICD-10-CM

## 2021-01-12 DIAGNOSIS — I152 Hypertension secondary to endocrine disorders: Secondary | ICD-10-CM

## 2021-01-12 DIAGNOSIS — E1159 Type 2 diabetes mellitus with other circulatory complications: Secondary | ICD-10-CM

## 2021-01-12 HISTORY — PX: IR US GUIDE VASC ACCESS RIGHT: IMG2390

## 2021-01-12 HISTORY — PX: IR FLUORO GUIDE CV LINE RIGHT: IMG2283

## 2021-01-12 LAB — GLUCOSE, CAPILLARY
Glucose-Capillary: 128 mg/dL — ABNORMAL HIGH (ref 70–99)
Glucose-Capillary: 169 mg/dL — ABNORMAL HIGH (ref 70–99)
Glucose-Capillary: 202 mg/dL — ABNORMAL HIGH (ref 70–99)
Glucose-Capillary: 248 mg/dL — ABNORMAL HIGH (ref 70–99)
Glucose-Capillary: 365 mg/dL — ABNORMAL HIGH (ref 70–99)
Glucose-Capillary: 95 mg/dL (ref 70–99)

## 2021-01-12 LAB — HEPATITIS B SURFACE ANTIBODY,QUALITATIVE: Hep B S Ab: NONREACTIVE

## 2021-01-12 LAB — CBC
HCT: 29.1 % — ABNORMAL LOW (ref 36.0–46.0)
Hemoglobin: 9.7 g/dL — ABNORMAL LOW (ref 12.0–15.0)
MCH: 25.6 pg — ABNORMAL LOW (ref 26.0–34.0)
MCHC: 33.3 g/dL (ref 30.0–36.0)
MCV: 76.8 fL — ABNORMAL LOW (ref 80.0–100.0)
Platelets: 233 10*3/uL (ref 150–400)
RBC: 3.79 MIL/uL — ABNORMAL LOW (ref 3.87–5.11)
RDW: 15.3 % (ref 11.5–15.5)
WBC: 10.1 10*3/uL (ref 4.0–10.5)
nRBC: 0 % (ref 0.0–0.2)

## 2021-01-12 LAB — RENAL FUNCTION PANEL
Albumin: 2.9 g/dL — ABNORMAL LOW (ref 3.5–5.0)
Anion gap: 14 (ref 5–15)
BUN: 70 mg/dL — ABNORMAL HIGH (ref 6–20)
CO2: 17 mmol/L — ABNORMAL LOW (ref 22–32)
Calcium: 6.3 mg/dL — CL (ref 8.9–10.3)
Chloride: 105 mmol/L (ref 98–111)
Creatinine, Ser: 6.86 mg/dL — ABNORMAL HIGH (ref 0.44–1.00)
GFR, Estimated: 8 mL/min — ABNORMAL LOW (ref >60)
Glucose, Bld: 349 mg/dL — ABNORMAL HIGH (ref 70–99)
Phosphorus: 6.7 mg/dL — ABNORMAL HIGH (ref 2.5–4.6)
Potassium: 4.4 mmol/L (ref 3.5–5.1)
Sodium: 136 mmol/L (ref 135–145)

## 2021-01-12 LAB — IRON AND TIBC
Iron: 55 ug/dL (ref 28–170)
Saturation Ratios: 16 % (ref 10.4–31.8)
TIBC: 354 ug/dL (ref 250–450)
UIBC: 299 ug/dL

## 2021-01-12 LAB — MRSA PCR SCREENING: MRSA by PCR: NEGATIVE

## 2021-01-12 LAB — PHOSPHORUS: Phosphorus: 7.3 mg/dL — ABNORMAL HIGH (ref 2.5–4.6)

## 2021-01-12 LAB — HEPATITIS B CORE ANTIBODY, TOTAL: Hep B Core Total Ab: NONREACTIVE

## 2021-01-12 LAB — HEPATITIS B SURFACE ANTIGEN: Hepatitis B Surface Ag: NONREACTIVE

## 2021-01-12 LAB — FERRITIN: Ferritin: 71 ng/mL (ref 11–307)

## 2021-01-12 MED ORDER — INSULIN PUMP
Freq: Three times a day (TID) | SUBCUTANEOUS | Status: DC
  Administered 2021-01-12: 3.1 via SUBCUTANEOUS
  Administered 2021-01-14: 1.3 via SUBCUTANEOUS
  Filled 2021-01-12: qty 1

## 2021-01-12 MED ORDER — HEPARIN SODIUM (PORCINE) 1000 UNIT/ML IJ SOLN
INTRAMUSCULAR | Status: AC
  Filled 2021-01-12: qty 1

## 2021-01-12 MED ORDER — FENTANYL CITRATE (PF) 100 MCG/2ML IJ SOLN
INTRAMUSCULAR | Status: AC | PRN
  Administered 2021-01-12: 50 ug via INTRAVENOUS

## 2021-01-12 MED ORDER — FENTANYL CITRATE (PF) 100 MCG/2ML IJ SOLN
INTRAMUSCULAR | Status: AC
  Filled 2021-01-12: qty 2

## 2021-01-12 MED ORDER — MIDAZOLAM HCL 2 MG/2ML IJ SOLN
INTRAMUSCULAR | Status: AC
  Filled 2021-01-12: qty 2

## 2021-01-12 MED ORDER — CEFAZOLIN SODIUM-DEXTROSE 2-4 GM/100ML-% IV SOLN
INTRAVENOUS | Status: AC
  Administered 2021-01-12: 2 g via INTRAVENOUS
  Filled 2021-01-12: qty 100

## 2021-01-12 MED ORDER — LIDOCAINE-EPINEPHRINE 1 %-1:100000 IJ SOLN
INTRAMUSCULAR | Status: AC | PRN
  Administered 2021-01-12: 10 mL

## 2021-01-12 MED ORDER — LIDOCAINE-EPINEPHRINE 1 %-1:100000 IJ SOLN
INTRAMUSCULAR | Status: AC
  Filled 2021-01-12: qty 1

## 2021-01-12 MED ORDER — CEFAZOLIN SODIUM-DEXTROSE 2-4 GM/100ML-% IV SOLN
2.0000 g | INTRAVENOUS | Status: AC
  Administered 2021-01-12: 2 g via INTRAVENOUS
  Filled 2021-01-12: qty 100

## 2021-01-12 MED ORDER — POTASSIUM CHLORIDE 20 MEQ PO PACK
40.0000 meq | PACK | Freq: Once | ORAL | Status: AC
  Administered 2021-01-12: 40 meq via ORAL
  Filled 2021-01-12: qty 2

## 2021-01-12 MED ORDER — MIDAZOLAM HCL 2 MG/2ML IJ SOLN
INTRAMUSCULAR | Status: AC | PRN
  Administered 2021-01-12: 1 mg via INTRAVENOUS

## 2021-01-12 MED ORDER — SODIUM CHLORIDE 0.9 % IV SOLN
250.0000 mg | Freq: Every day | INTRAVENOUS | Status: AC
  Administered 2021-01-12 – 2021-01-15 (×4): 250 mg via INTRAVENOUS
  Filled 2021-01-12 (×5): qty 20

## 2021-01-12 MED ORDER — HEPARIN SODIUM (PORCINE) 5000 UNIT/ML IJ SOLN
5000.0000 [IU] | Freq: Three times a day (TID) | INTRAMUSCULAR | Status: DC
  Administered 2021-01-13 – 2021-01-15 (×4): 5000 [IU] via SUBCUTANEOUS
  Filled 2021-01-12 (×6): qty 1

## 2021-01-12 MED ORDER — HEPARIN SODIUM (PORCINE) 1000 UNIT/ML IJ SOLN
INTRAMUSCULAR | Status: AC
  Administered 2021-01-12: 3200 [IU]
  Filled 2021-01-12: qty 3

## 2021-01-12 MED ORDER — CEFAZOLIN SODIUM-DEXTROSE 2-4 GM/100ML-% IV SOLN
2.0000 g | Freq: Once | INTRAVENOUS | Status: AC
  Filled 2021-01-12: qty 100

## 2021-01-12 NOTE — Consult Note (Signed)
Chief Complaint: Patient was seen in consultation today for tunneled dialysis catheter placement Chief Complaint  Patient presents with  . Altered Mental Status  . Weakness   at the request of Dr Joylene Grapes  Supervising Physician: Sandi Mariscal  Patient Status: [facility] - In-pt  History of Present Illness: Kimberly [name] is a 30 y.o. female   Progressive renal failure Diabetic renal disease CKD 5; DM; HTN New ESRD  Had presented to [facility] week ago with SOB and CP Hyperkalemia and Hg 4 Improved with treatment and dc'd But, called Nephrology about N/V; Uremia sxs; poor intake Admitted 5/16- general weakness  Nephrology has seen pt Requesting tunn dialysis catheter to initiate dialysis   Past Medical History:  Diagnosis Date  . Diabetes mellitus without complication ([place])   . Preeclampsia   . Preeclampsia without severe features   . Sickle cell trait [facility])     Past Surgical History:  Procedure Laterality Date  . ESOPHAGOGASTRODUODENOSCOPY  08/2018   [facility] and a gastric enptying study   . WISDOM TOOTH EXTRACTION      Allergies: Metoclopramide  Medications: Prior to Admission medications   Medication Sig Start Date End Date Taking? Authorizing Provider  amLODipine (NORVASC) 5 MG tablet Take 5 mg by mouth daily. 09/23/20  Yes [provider]  BAYER CONTOUR NEXT TEST test strip USE AS DIRECTED TO TEST 6 TO 8 TIMES DAILY Patient taking differently: 1 each by Other route. 10/01/14  Yes Woodroe Mode, MD  Blood Glucose Monitoring Suppl (CONTOUR NEXT EZ MONITOR) W/DEVICE KIT 1 each by Does not apply route 6 (six) times daily. Provide lancets for testing six times daily. Type I DM with diabetes on Insulin Pump  DX 648.03 06/17/13  Yes Woodroe Mode, MD  calcitRIOL (ROCALTROL) 0.25 MCG capsule Take 0.25 mcg by mouth daily. 12/08/20  Yes [provider]  calcium carbonate (TUMS - DOSED IN MG ELEMENTAL CALCIUM) 500 MG chewable tablet Chew 2  tablets by mouth 3 (three) times daily.   Yes [provider]  cyanocobalamin (,VITAMIN B-12,) 1000 MCG/ML injection Inject 1 mL into the muscle every 30 (thirty) days. 06/03/20  Yes [provider]  etonogestrel (NEXPLANON) 68 MG IMPL implant Inject 1 each into the skin once.   Yes [provider]  furosemide (LASIX) 40 MG tablet Take 1 tablet (40 mg total) by mouth daily. 01/03/21 07/02/21 Yes Mercy Riding, MD  hydrALAZINE (APRESOLINE) 25 MG tablet Take 25 mg by mouth 3 (three) times daily. 09/23/20  Yes [provider]  Insulin Human (INSULIN PUMP) SOLN Inject into the skin. Humalog   Yes [provider]  insulin lispro (HUMALOG) 100 UNIT/ML injection 0-9 Units TID and 0-5 units at bedside as per sliding scale Patient taking differently: Inject 0-6 Units into the skin See admin instructions. 0-6 Units three times a day per sliding scale; Via insulin pump; INJECT 100 UNITS MAX DAILY DOSEAGE VIA INSULIN PUMP UNDER THE SKIN. 09/30/20 09/30/21 Yes Pokhrel, Laxman, MD  metoprolol tartrate (LOPRESSOR) 50 MG tablet Take 1 tablet (50 mg total) by mouth 2 (two) times daily. 03/23/18  Yes Patrecia Pour, Christean Grief, MD  sodium bicarbonate 650 MG tablet Take 650 mg by mouth 2 (two) times daily. 07/15/20  Yes [provider]     Family History  Problem Relation Age of Onset  . Lupus Mother   . Aneurysm Mother   . Colon polyps Maternal Aunt        great  aunt  . Colon cancer Neg Hx   . Esophageal cancer Neg Hx     Social History   Socioeconomic History  . Marital status: Single    Spouse name: n/a  . Number of children: 0  . Years of education: college  . Highest education level: Not on file  Occupational History  . Occupation: Stage manager: [employer]  Tobacco Use  . Smoking status: Never Smoker  . Smokeless tobacco: Never Used  Vaping Use  . Vaping Use: Never used  Substance and Sexual Activity  . Alcohol use: No   . Drug use: No  . Sexual activity: Yes    Partners: Male    Birth control/protection: Condom  Other Topics Concern  . Not on file  Social History Narrative   Lives alone.  Her father lives in [place], her brother in [place].  Student at [employer].   Social Determinants of Health   Financial Resource Strain: Not on file  Food Insecurity: Not on file  Transportation Needs: Not on file  Physical Activity: Not on file  Stress: Not on file  Social Connections: Not on file    Review of Systems: A 12 point ROS discussed and pertinent positives are indicated in the HPI above.  All other systems are negative.  Review of Systems  Constitutional: Positive for activity change and fatigue. Negative for fever.  Respiratory: Positive for shortness of breath.   Cardiovascular: Negative for chest pain.  Gastrointestinal: Positive for nausea. Negative for abdominal pain.  Neurological: Positive for weakness.  Psychiatric/Behavioral: Negative for behavioral problems and confusion.    Vital Signs: BP 119/80 (BP Location: Right Arm)   Pulse 82   Temp 97.7 F (36.5 C) (Oral)   Resp 17   Ht _0  (1.651 m)   Wt 164 lb 3.9 oz (74.5 kg)   LMP 01/08/2021   SpO2 95%   BMI 27.33 kg/m   Physical Exam Vitals reviewed.  HENT:     Mouth/Throat:     Mouth: Mucous membranes are moist.  Cardiovascular:     Rate and Rhythm: Normal rate and regular rhythm.     Heart sounds: Normal heart sounds.  Pulmonary:     Effort: Pulmonary effort is normal.     Breath sounds: Normal breath sounds.  Abdominal:     Palpations: Abdomen is soft.  Musculoskeletal:        General: Normal range of motion.  Skin:    General: Skin is warm.  Neurological:     Mental Status: She is alert and oriented to person, place, and time.  Psychiatric:        Behavior: Behavior normal.     Imaging: DG Chest Portable 1 View  Result Date: 01/11/2021 CLINICAL DATA:  Abnormal breath sounds.  Altered mental status and weakness. EXAM: PORTABLE CHEST 1 VIEW COMPARISON:  01/02/2021. FINDINGS: Trachea is midline. Heart is enlarged, stable. Lungs are somewhat low in volume but clear. No pleural fluid. IMPRESSION: No acute findings. Electronically Signed   By: Lorin Picket M.D.   On: 01/11/2021 13:40   DG Chest Portable 1 View  Result Date: 01/02/2021 CLINICAL DATA:  Chest pain and nausea EXAM: PORTABLE CHEST 1 VIEW COMPARISON:  03/19/2018 FINDINGS: The heart size and mediastinal contours are within normal limits. Both lungs are clear. The visualized skeletal structures are unremarkable. IMPRESSION: No active disease. Electronically Signed   By: Inez Catalina M.D.   On:  01/02/2021 16:58   ECHOCARDIOGRAM COMPLETE  Result Date: 01/03/2021    ECHOCARDIOGRAM REPORT   Patient Name:   SAREN CORKERN Date of Exam: 01/03/2021 Medical Rec #:  [id number]         Height:       65.0 in Accession #:    [id number]        Weight:       157.8 lb Date of Birth:  23-Sep-1990         BSA:          1.789 m Patient Age:    30 years          BP:           141/85 mmHg Patient Gender: F                 HR:           79 bpm. Exam Location:  Inpatient Procedure: 2D Echo, Cardiac Doppler, Color Doppler and Strain Analysis Indications:    Chest pain  History:        Patient has no prior history of Echocardiogram examinations.                 Risk Factors:Diabetes.  Sonographer:    [place] Referring Phys: [id number] [place]  1. Left ventricular ejection fraction, by estimation, is 55 to 60%. The left ventricle has normal function. The left ventricle has no regional wall motion abnormalities. There is moderate left ventricular hypertrophy. Left ventricular diastolic parameters are consistent with Grade II diastolic dysfunction (pseudonormalization). The average left ventricular global longitudinal strain is -13.4 %. The global longitudinal strain is abnormal.  2. Right ventricular systolic function is  normal. The right ventricular size is mildly enlarged. There is normal pulmonary artery systolic pressure. The estimated right ventricular systolic pressure is 24.8 mmHg.  3. Left atrial size was mildly dilated.  4. The mitral valve is normal in structure. Mild to moderate mitral valve regurgitation. No evidence of mitral stenosis.  5. Tricuspid valve regurgitation is mild to moderate.  6. The aortic valve is tricuspid. Aortic valve regurgitation is trivial. No aortic stenosis is present.  7. The inferior vena cava is dilated in size with >50% respiratory variability, suggesting right atrial pressure of 8 mmHg. FINDINGS  Left Ventricle: Left ventricular ejection fraction, by estimation, is 55 to 60%. The left ventricle has normal function. The left ventricle has no regional wall motion abnormalities. The average left ventricular global longitudinal strain is -13.4 %. The global longitudinal strain is abnormal. The left ventricular internal cavity size was normal in size. There is moderate left ventricular hypertrophy. Left ventricular diastolic parameters are consistent with Grade II diastolic dysfunction (pseudonormalization). Right Ventricle: The right ventricular size is mildly enlarged. No increase in right ventricular wall thickness. Right ventricular systolic function is normal. There is normal pulmonary artery systolic pressure. The tricuspid regurgitant velocity is 2.58  m/s, and with an assumed right atrial pressure of 8 mmHg, the estimated right ventricular systolic pressure is 25.0 mmHg. Left Atrium: Left atrial size was mildly dilated. Right Atrium: Right atrial size was normal in size. Pericardium: There is no evidence of pericardial effusion. Mitral Valve: The mitral valve is normal in structure. Mild to moderate mitral valve regurgitation. No evidence of mitral valve stenosis. MV peak gradient, 9.6 mmHg. The mean mitral valve gradient is 5.0 mmHg. Tricuspid Valve: The tricuspid valve is normal in  structure. Tricuspid valve regurgitation is mild to  moderate. No evidence of tricuspid stenosis. Aortic Valve: The aortic valve is tricuspid. Aortic valve regurgitation is trivial. No aortic stenosis is present. Aortic valve mean gradient measures 3.0 mmHg. Aortic valve peak gradient measures 4.8 mmHg. Aortic valve area, by VTI measures 1.84 cm. Pulmonic Valve: The pulmonic valve was normal in structure. Pulmonic valve regurgitation is mild. No evidence of pulmonic stenosis. Aorta: The aortic root is normal in size and structure. Venous: The inferior vena cava is dilated in size with greater than 50% respiratory variability, suggesting right atrial pressure of 8 mmHg. IAS/Shunts: No atrial level shunt detected by color flow Doppler.  LEFT VENTRICLE PLAX 2D LVIDd:         4.20 cm     Diastology LVIDs:         3.00 cm     LV e' medial:    5.87 cm/s LV PW:         1.50 cm     LV E/e' medial:  23.3 LV IVS:        1.50 cm     LV e' lateral:   7.94 cm/s LVOT diam:     1.80 cm     LV E/e' lateral: 17.3 LV SV:         41 LV SV Index:   23          2D Longitudinal Strain LVOT Area:     2.54 cm    2D Strain GLS Avg:     -13.4 %  LV Volumes (MOD) LV vol d, MOD A2C: 66.6 ml LV vol d, MOD A4C: 72.0 ml LV vol s, MOD A2C: 38.3 ml LV vol s, MOD A4C: 36.2 ml LV SV MOD A2C:     28.3 ml LV SV MOD A4C:     72.0 ml LV SV MOD BP:      31.8 ml RIGHT VENTRICLE RV S prime:     13.60 cm/s TAPSE (M-mode): 2.5 cm LEFT ATRIUM             Index       RIGHT ATRIUM           Index LA diam:        3.60 cm 2.01 cm/m  RA Area:     15.10 cm LA Vol (A2C):   81.2 ml 45.39 ml/m RA Volume:   38.30 ml  21.41 ml/m LA Vol (A4C):   61.4 ml 34.32 ml/m LA Biplane Vol: 70.7 ml 39.52 ml/m  AORTIC VALVE                   PULMONIC VALVE AV Area (Vmax):    1.74 cm    PV Vmax:       0.71 m/s AV Area (Vmean):   1.92 cm    PV Vmean:      58.500 cm/s AV Area (VTI):     1.84 cm    PV VTI:        0.193 m AV Vmax:           110.00 cm/s PV Peak grad:  2.0 mmHg AV  Vmean:          74.800 cm/s PV Mean grad:  1.0 mmHg AV VTI:            0.221 m AV Peak Grad:      4.8 mmHg AV Mean Grad:      3.0 mmHg LVOT Vmax:         75.00 cm/s  LVOT Vmean:        56.400 cm/s LVOT VTI:          0.160 m LVOT/AV VTI ratio: 0.72  AORTA Ao Root diam: 2.90 cm Ao Asc diam:  2.80 cm MITRAL VALVE                 TRICUSPID VALVE MV Area (PHT): 4.49 cm      TR Peak grad:   26.6 mmHg MV Area VTI:   1.24 cm      TR Vmax:        258.00 cm/s MV Peak grad:  9.6 mmHg MV Mean grad:  5.0 mmHg      SHUNTS MV Vmax:       1.55 m/s      Systemic VTI:  0.16 m MV Vmean:      103.0 cm/s    Systemic Diam: 1.80 cm MV Decel Time: 169 msec MR Peak grad:    149.1 mmHg MR Mean grad:    105.0 mmHg MR Vmax:         610.50 cm/s MR Vmean:        484.0 cm/s MR PISA:         1.01 cm MR PISA Eff ROA: 4 mm MR PISA Radius:  0.40 cm MV E velocity: 137.00 cm/s MV A velocity: 81.50 cm/s MV E/A ratio:  1.68 Cherlynn Kaiser MD Electronically signed by Cherlynn Kaiser MD Signature Date/Time: 01/03/2021/11:50:42 AM    Final     Labs:  CBC: Recent Labs    09/29/20 1626 12/31/20 1348 01/02/21 1801 01/02/21 1808 01/03/21 0634 01/11/21 1153  WBC 11.0*  --  22.0*  --  12.5* 8.9  HGB 10.2*   < > 4.0* 9.5* 10.1* 10.3*  HCT 31.2*  --  12.0* 28.0* 29.0* 30.8*  PLT 304  --  296  --  233 232   < > = values in this interval not displayed.    COAGS: Recent Labs    01/11/21 1153  INR 1.0    BMP: Recent Labs    01/02/21 1801 01/02/21 1808 01/03/21 0634 01/11/21 1153 01/12/21 0259  NA 131* 133* 138  137 140 136  K 6.4* 6.0* 4.2  4.2 3.2* 4.4  CL 105  --  108  107 108 105  CO2 11*  --  17*  18* 19* 17*  GLUCOSE 459*  --  159*  160* 44* 349*  BUN 81*  --  79*  77* 61* 70*  CALCIUM 6.0*  --  6.4*  6.4* 6.1* 6.3*  CREATININE 7.12*  --  6.74*  6.77* 6.97* 6.86*  GFRNONAA 7*  --  8*  8* 7* 8*    LIVER FUNCTION TESTS: Recent Labs    06/03/20 1106 12/31/20 1341 01/02/21 1801 01/03/21 0634  01/11/21 1153 01/12/21 0259  BILITOT 0.4  --  1.0  --  0.4  --   AST 22  --  42*  --  10*  --   ALT 14  --  29  --  10  --   ALKPHOS 100  --  128*  --  91  --   PROT 6.7  --  6.5  --  6.9  --   ALBUMIN 2.7*   < > 3.1* 2.9* 3.1* 2.9*   < > = values in this interval not displayed.    TUMOR MARKERS: No results for input(s): AFPTM, CEA, CA199, CHROMGRNA in the last 8760 hours.  Assessment and  Plan:  Progression of renal failure; new ESRD Uremic sxs To initiate dialysis per Nephrology Scheduled for tunneled HD catheter placement Risks and benefits discussed with the patient including, but not limited to bleeding, infection, vascular injury, pneumothorax which may require chest tube placement, air embolism or even death  All of the patient's questions were answered, patient is agreeable to proceed. Consent signed and in chart.   Thank you for this interesting consult.  I greatly enjoyed meeting Kimberly [name] and look forward to participating in their care.  A copy of this report was sent to the requesting provider on this date.  Electronically Signed: Lavonia Drafts, PA-C 01/12/2021, 7:00 AM   I spent a total of 20 Minutes    in face to face in clinical consultation, greater than 50% of which was counseling/coordinating care for tunneled dialysis catheter placement

## 2021-01-12 NOTE — Sedation Documentation (Signed)
Pt is awake alert and oriented. Consent signed, pt is npo. Preparing pt for procedure at this time. Pt assisted self to IR table for procedure without difficulty

## 2021-01-12 NOTE — Progress Notes (Signed)
Dr. Sidney Ace notified of patient's calcium level of 6.3. Calcium carbonate PO currently scheduled TID, No changes in current plan of care received from MD at this time. Will continue to monitor.

## 2021-01-12 NOTE — Consult Note (Addendum)
Hospital Consult  VASCULAR SURGERY ASSESSMENT & PLAN:   END-STAGE RENAL DISEASE: We have been asked to place hemodialysis access.  She is right-handed.  Her vein mapping is pending.  She is having a tunneled dialysis catheter placed today by interventional radiology.  Her surgery has been scheduled for Thursday with Dr. Donzetta [name].  RENAL: If possible do not dialyze the patient on Thursday as she is scheduled for surgery that day.  01/12/2021 10:34 AM  Kimberly Mayo, MD    Reason for Consult:  dialysis access Requesting Physician:  Kimberly [name] MRN #:  [id number]  History of Present Illness: This is a 30 y.o. female with new ESRD in need of dialysis access.  She presented to the hospital a day ago with CP and SOB as well as mild uremia with nausea and poor po intake.  She was found to have hyperkalemia and a hgb of 4.   She will be going to Interventional Radiology for [facility] placement.  [facility] is consulted for permanent access placement.   The pt is not on a statin for cholesterol management.  The pt is not on a daily aspirin.   Other AC:  none The pt is on BB, CCB for hypertension.   The pt is diabetic.   Tobacco hx:  never  Past Medical History:  Diagnosis Date  . Diabetes mellitus without complication ([place])   . Preeclampsia   . Preeclampsia without severe features   . Sickle cell trait [facility])     Past Surgical History:  Procedure Laterality Date  . ESOPHAGOGASTRODUODENOSCOPY  08/2018   [facility] and a gastric enptying study   . WISDOM TOOTH EXTRACTION      Allergies  Allergen Reactions  . Metoclopramide Other (See Comments)    tardive dyskinesia per outside provider note     Prior to Admission medications   Medication Sig Start Date End Date Taking? Authorizing Provider  amLODipine (NORVASC) 5 MG tablet Take 5 mg by mouth daily. 09/23/20  Yes [provider]  BAYER CONTOUR NEXT TEST test strip USE AS DIRECTED TO TEST 6 TO 8 TIMES DAILY Patient taking  differently: 1 each by Other route. 10/01/14  Yes Woodroe Mode, MD  Blood Glucose Monitoring Suppl (CONTOUR NEXT EZ MONITOR) W/DEVICE KIT 1 each by Does not apply route 6 (six) times daily. Provide lancets for testing six times daily. Type I DM with diabetes on Insulin Pump  DX 648.03 06/17/13  Yes Woodroe Mode, MD  calcitRIOL (ROCALTROL) 0.25 MCG capsule Take 0.25 mcg by mouth daily. 12/08/20  Yes [provider]  calcium carbonate (TUMS - DOSED IN MG ELEMENTAL CALCIUM) 500 MG chewable tablet Chew 2 tablets by mouth 3 (three) times daily.   Yes [provider]  cyanocobalamin (,VITAMIN B-12,) 1000 MCG/ML injection Inject 1 mL into the muscle every 30 (thirty) days. 06/03/20  Yes [provider]  etonogestrel (NEXPLANON) 68 MG IMPL implant Inject 1 each into the skin once.   Yes [provider]  furosemide (LASIX) 40 MG tablet Take 1 tablet (40 mg total) by mouth daily. 01/03/21 07/02/21 Yes Mercy Riding, MD  hydrALAZINE (APRESOLINE) 25 MG tablet Take 25 mg by mouth 3 (three) times daily. 09/23/20  Yes [provider]  Insulin Human (INSULIN PUMP) SOLN Inject into the skin. Humalog   Yes [provider]  insulin lispro (HUMALOG) 100 UNIT/ML injection 0-9 Units TID and 0-5 units at bedside as per sliding scale Patient taking differently: Inject 0-6  Units into the skin See admin instructions. 0-6 Units three times a day per sliding scale; Via insulin pump; INJECT 100 UNITS MAX DAILY DOSEAGE VIA INSULIN PUMP UNDER THE SKIN. 09/30/20 09/30/21 Yes Pokhrel, Laxman, MD  metoprolol tartrate (LOPRESSOR) 50 MG tablet Take 1 tablet (50 mg total) by mouth 2 (two) times daily. 03/23/18  Yes Patrecia Pour, Christean Grief, MD  sodium bicarbonate 650 MG tablet Take 650 mg by mouth 2 (two) times daily. 07/15/20  Yes [provider]    Social History   Socioeconomic History  . Marital status: Single    Spouse name: n/a  . Number of children: 0  . Years of education:  college  . Highest education level: Not on file  Occupational History  . Occupation: Stage manager: [employer]  Tobacco Use  . Smoking status: Never Smoker  . Smokeless tobacco: Never Used  Vaping Use  . Vaping Use: Never used  Substance and Sexual Activity  . Alcohol use: No  . Drug use: No  . Sexual activity: Yes    Partners: Male    Birth control/protection: Condom  Other Topics Concern  . Not on file  Social History Narrative   Lives alone.  Her father lives in [place], her brother in [place].  Student at [employer].   Social Determinants of Health   Financial Resource Strain: Not on file  Food Insecurity: Not on file  Transportation Needs: Not on file  Physical Activity: Not on file  Stress: Not on file  Social Connections: Not on file  Intimate Partner Violence: Not on file    Family History  Problem Relation Age of Onset  . Lupus Mother   . Aneurysm Mother   . Colon polyps Maternal Aunt        great aunt  . Colon cancer Neg Hx   . Esophageal cancer Neg Hx     ROS: [x]  Positive   [ ]  Negative   [ ]  All sytems reviewed and are negative  Cardiac: [x]  chest pain/pressure [x]  dyspnea   Vascular: []  pain in legs while walking []  non-healing ulcers []  hx of DVT []  swelling in legs  Pulmonary: []  asthma/wheezing []  home O2  Neurologic: []  hx of CVA []  mini stroke   Hematologic: []  hx of cancer  Endocrine:   [x]  diabetes   GI []  GERD [x]  nausea  GU: [x]  CKD/renal failure [x]  HD  Psychiatric: []  anxiety []  depression  Musculoskeletal: []  arthritis []  joint pain  Integumentary: []  rashes []  ulcers  Constitutional: []  fever  []  chills   Physical Examination  Vitals:   01/12/21 0330 01/12/21 0751  BP: 119/80 (!) 148/95  Pulse: 82 78  Resp: 17 18  Temp: 97.7 F (36.5 C) 98.7 F (37.1 C)  SpO2: 95% 99%   Body mass index is 27.33 kg/m.  General:  WDWN in NAD Gait:  Not observed HENT: WNL, normocephalic Pulmonary: normal non-labored breathing Cardiac: regular, without Murmur; without carotid bruits Skin: without rashes Vascular Exam/Pulses:  Right Left  Radial 2+ (normal) 2+ (normal)  Ulnar 2+ (normal) 2+ (normal)  Brachial 2+ (normal) 2+ (normal)   Extremities: without ischemic changes, without Gangrene , without cellulitis; without open wounds; IV in left hand Musculoskeletal: no muscle wasting or atrophy  Neurologic: A&O X 3; speech is fluent/normal Psychiatric:  The pt has Normal affect.   CBC    Component Value Date/Time   WBC 8.9 01/11/2021 1153  RBC 3.92 01/11/2021 1153   HGB 10.3 (L) 01/11/2021 1153   HCT 30.8 (L) 01/11/2021 1153   PLT 232 01/11/2021 1153   MCV 78.6 (L) 01/11/2021 1153   MCH 26.3 01/11/2021 1153   MCHC 33.4 01/11/2021 1153   RDW 15.5 01/11/2021 1153   LYMPHSABS 1.7 01/11/2021 1153   MONOABS 0.7 01/11/2021 1153   EOSABS 0.3 01/11/2021 1153   BASOSABS 0.0 01/11/2021 1153    BMET    Component Value Date/Time   NA 136 01/12/2021 0259   K 4.4 01/12/2021 0259   CL 105 01/12/2021 0259   CO2 17 (L) 01/12/2021 0259   GLUCOSE 349 (H) 01/12/2021 0259   BUN 70 (H) 01/12/2021 0259   CREATININE 6.86 (H) 01/12/2021 0259   CREATININE 0.48 (L) 10/07/2013 0834   CALCIUM 6.3 (LL) 01/12/2021 0259   CALCIUM 5.6 (LL) 12/31/2020 1341   GFRNONAA 8 (L) 01/12/2021 0259   GFRAA 26 (L) 12/03/2019 0153    COAGS: Lab Results  Component Value Date   INR 1.0 01/11/2021   INR 1.12 03/18/2015     Non-Invasive Vascular Imaging:   BUE Vein Mapping ordered   ASSESSMENT/PLAN: This is a 30 y.o. female with new ESRD in need of permanent dialysis access.  She is scheduled to get tunneled dialysis catheter in IR today.  -pt is right hand dominant -will plan for left arm access tomorrow.  I have ordered vein mapping. -will restrict left upper extremity and remove IV out of left hand -I discussed access (fistula vs graft) with  pt and possibility of it not maturing, steal syndrome and that access does not last forever and will need procedures in the future whether it is intervention or new access.  Also discussed with pt that if the basilic was used, it would require 2nd procedure for transposition.  Pt expressed understanding.   Leontine Locket, PA-C [facility] [phone]

## 2021-01-12 NOTE — Progress Notes (Signed)
PROGRESS NOTE        PATIENT DETAILS Name: Kimberly [name] Age: 30 y.o. Sex: female Date of Birth: 05-07-1991 Admit Date: 01/11/2021 Admitting Physician Para Skeans, MD [id number], Mindi Curling, MD  Brief Narrative: Patient is a 30 y.o. female with a history of CKD stage V, DM-1 on insulin pump, HTN-presented with altered mental status/weakness-thought to be due to progression of CKD stage V to ESRD-subsequently admitted to the hospitalist service.  See below for further details.  Significant events: 5/16>> admit for lethargy/AMS-due to progression of CKD stage V to ESRD.  Significant studies: 5/8>> Echo: EF 55-60%, moderate LVH, grade 2 diastolic dysfunction. 5/16>> chest x-ray: No pneumonia  Antimicrobial therapy: None  Microbiology data: 5/16>> influenza/COVID PCR: Negative  Procedures : None  Consults: Nephrology, IR, vascular surgery  DVT Prophylaxis : heparin injection 5,000 Units Start: 01/13/21 0600 SCDs Start: 01/11/21 1611   Subjective: Awake/alert-no major issues overnight.   Assessment/Plan: Lethargy/mild metabolic encephalopathy: In the setting of uremia-improved with supportive care.    Progression of CKD stage V to ESRD: Nephrology following-plans to start hemodialysis once TDC placed by IR.  Vascular surgery following for more permanent access.  HTN: BP reasonable-continue metoprolol/hydralazine/amlodipine.  Anemia: Due to CKD-IV iron/ESA per nephrology.  DM-1: Continue insulin pump-she is completely awake and alert-diabetic coordinator will reach out to her primary endocrinologist for further adjustment recommendations now that patient is ESRD.  Recent Labs    01/12/21 0347 01/12/21 0611 01/12/21 0753  GLUCAP 365* 248* 202*    Diet: Diet Order            Diet NPO time specified Except for: Sips with Meds  Diet effective midnight                  Code Status: Full code  Family Communication: None at  bedside  Disposition Plan: Status is: Inpatient  Remains inpatient appropriate because:Inpatient level of care appropriate due to severity of illness   Dispo: The patient is from: Home              Anticipated d/c is to: Home              Patient currently is not medically stable to d/c.   Difficult to place patient No    Barriers to Discharge: New ESRD-initiating HD-we will need outpatient HD arrangements before discharge.  Antimicrobial agents: Anti-infectives (From admission, onward)   Start     Dose/Rate Route Frequency Ordered Stop   01/12/21 0800  ceFAZolin (ANCEF) IVPB 2g/100 mL premix        2 g 200 mL/hr over 30 Minutes Intravenous To Radiology 01/12/21 0709 01/12/21 0903       Time spent: 35 minutes-Greater than 50% of this time was spent in counseling, explanation of diagnosis, planning of further management, and coordination of care.  MEDICATIONS: Scheduled Meds: . amLODipine  5 mg Oral Daily  . calcitRIOL  0.5 mcg Oral Daily  . calcium carbonate  2 tablet Oral TID  . Chlorhexidine Gluconate Cloth  6 each Topical Q0600  . [START ON 01/13/2021] heparin  5,000 Units Subcutaneous Q8H  . hydrALAZINE  25 mg Oral TID  . insulin pump   Subcutaneous TID WC, HS, 0200  . metoprolol tartrate  50 mg Oral BID  . sodium bicarbonate  650 mg Oral BID   Continuous Infusions: .  ferric gluconate (FERRLECIT/NULECIT) IV     PRN Meds:.   PHYSICAL EXAM: Vital signs: Vitals:   01/11/21 2301 01/11/21 2351 01/12/21 0330 01/12/21 0751  BP: (!) 164/98  119/80 (!) 148/95  Pulse: 80 81 82 78  Resp: '18  17 18  '$ Temp: 97.6 F (36.4 C)  97.7 F (36.5 C) 98.7 F (37.1 C)  TempSrc: Oral  Oral Oral  SpO2: 100%  95% 99%  Weight: 74.5 kg     Height: '5\' 5"'$  (1.651 m)      Filed Weights   01/11/21 1220 01/11/21 2301  Weight: 71.6 kg 74.5 kg   Body mass index is 27.33 kg/m.   Gen Exam:Alert awake-not in any distress HEENT:atraumatic, normocephalic Chest: B/L clear to  auscultation anteriorly CVS:S1S2 regular Abdomen:soft non tender, non distended Extremities:no edema Neurology: Non focal Skin: no rash  I have personally reviewed following labs and imaging studies  LABORATORY DATA: CBC: Recent Labs  Lab 01/11/21 1153  WBC 8.9  NEUTROABS 6.1  HGB 10.3*  HCT 30.8*  MCV 78.6*  PLT [id number]    Basic Metabolic Panel: Recent Labs  Lab 01/11/21 1153 01/11/21 2303 01/12/21 0259  NA 140  --  136  K 3.2*  --  4.4  CL 108  --  105  CO2 19*  --  17*  GLUCOSE 44*  --  349*  BUN 61*  --  70*  CREATININE 6.97*  --  6.86*  CALCIUM 6.1*  --  6.3*  MG 2.0  --   --   PHOS  --  7.3* 6.7*    GFR: Estimated Creatinine Clearance: 11.9 mL/min (A) (by C-G formula based on SCr of 6.86 mg/dL (H)).  Liver Function Tests: Recent Labs  Lab 01/11/21 1153 01/12/21 0259  AST 10*  --   ALT 10  --   ALKPHOS 91  --   BILITOT 0.4  --   PROT 6.9  --   ALBUMIN 3.1* 2.9*   No results for input(s): LIPASE, AMYLASE in the last 168 hours. No results for input(s): AMMONIA in the last 168 hours.  Coagulation Profile: Recent Labs  Lab 01/11/21 1153  INR 1.0    Cardiac Enzymes: No results for input(s): CKTOTAL, CKMB, CKMBINDEX, TROPONINI in the last 168 hours.  BNP (last 3 results) No results for input(s): PROBNP in the last 8760 hours.  Lipid Profile: No results for input(s): CHOL, HDL, LDLCALC, TRIG, CHOLHDL, LDLDIRECT in the last 72 hours.  Thyroid Function Tests: No results for input(s): TSH, T4TOTAL, FREET4, T3FREE, THYROIDAB in the last 72 hours.  Anemia Panel: Recent Labs    01/11/21 2303  FERRITIN 71  TIBC 354  IRON 55    Urine analysis:    Component Value Date/Time   COLORURINE STRAW (A) 01/02/2021 1807   APPEARANCEUR HAZY (A) 01/02/2021 1807   LABSPEC 1.009 01/02/2021 1807   PHURINE 5.0 01/02/2021 1807   GLUCOSEU >=500 (A) 01/02/2021 1807   HGBUR SMALL (A) 01/02/2021 1807   BILIRUBINUR NEGATIVE 01/02/2021 1807   KETONESUR 5 (A)  01/02/2021 1807   PROTEINUR >=300 (A) 01/02/2021 1807   UROBILINOGEN 0.2 03/21/2015 2010   NITRITE NEGATIVE 01/02/2021 1807   LEUKOCYTESUR NEGATIVE 01/02/2021 1807    Sepsis Labs: Lactic Acid, Venous    Component Value Date/Time   LATICACIDVEN 1.93 (H) 05/21/2018 1143    MICROBIOLOGY: Recent Results (from the past 240 hour(s))  Resp Panel by RT-PCR (Flu A&B, Covid) Nasopharyngeal Swab     Status: None  Collection Time: 01/02/21  5:24 PM   Specimen: Nasopharyngeal Swab; Nasopharyngeal(NP) swabs in vial transport medium  Result Value Ref Range Status   SARS Coronavirus 2 by RT PCR NEGATIVE NEGATIVE Final    Comment: (NOTE) SARS-CoV-2 target nucleic acids are NOT DETECTED.  The SARS-CoV-2 RNA is generally detectable in upper respiratory specimens during the acute phase of infection. The lowest concentration of SARS-CoV-2 viral copies this assay can detect is 138 copies/mL. A negative result does not preclude SARS-Cov-2 infection and should not be used as the sole basis for treatment or other patient management decisions. A negative result may occur with  improper specimen collection/handling, submission of specimen other than nasopharyngeal swab, presence of viral mutation(s) within the areas targeted by this assay, and inadequate number of viral copies(<138 copies/mL). A negative result must be combined with clinical observations, patient history, and epidemiological information. The expected result is Negative.  Fact Sheet for Patients:  EntrepreneurPulse.com.au  Fact Sheet for Healthcare Providers:  IncredibleEmployment.be  This test is no t yet approved or cleared by the [place] FDA and  has been authorized for detection and/or diagnosis of SARS-CoV-2 by FDA under an Emergency Use Authorization (EUA). This EUA will remain  in effect (meaning this test can be used) for the duration of the COVID-19 declaration under Section  564(b)(1) of the Act, 21 U.S.C.section 360bbb-3(b)(1), unless the authorization is terminated  or revoked sooner.       Influenza A by PCR NEGATIVE NEGATIVE Final   Influenza B by PCR NEGATIVE NEGATIVE Final    Comment: (NOTE) The Xpert Xpress SARS-CoV-2/FLU/RSV plus assay is intended as an aid in the diagnosis of influenza from Nasopharyngeal swab specimens and should not be used as a sole basis for treatment. Nasal washings and aspirates are unacceptable for Xpert Xpress SARS-CoV-2/FLU/RSV testing.  Fact Sheet for Patients: EntrepreneurPulse.com.au  Fact Sheet for Healthcare Providers: IncredibleEmployment.be  This test is not yet approved or cleared by the [place] FDA and has been authorized for detection and/or diagnosis of SARS-CoV-2 by FDA under an Emergency Use Authorization (EUA). This EUA will remain in effect (meaning this test can be used) for the duration of the COVID-19 declaration under Section 564(b)(1) of the Act, 21 U.S.C. section 360bbb-3(b)(1), unless the authorization is terminated or revoked.  Performed at [facility], [place] [address], [place], [place] [address]   MRSA PCR Screening     Status: None   Collection Time: 01/03/21 12:05 AM   Specimen: Nasal Mucosa; Nasopharyngeal  Result Value Ref Range Status   MRSA by PCR NEGATIVE NEGATIVE Final    Comment:        The GeneXpert MRSA Assay (FDA approved for NASAL specimens only), is one component of a comprehensive MRSA colonization surveillance program. It is not intended to diagnose MRSA infection nor to guide or monitor treatment for MRSA infections. Performed at [facility], [place] [address] [place], [place] [address]   Resp Panel by RT-PCR (Flu A&B, Covid) Nasopharyngeal Swab     Status: None   Collection Time: 01/11/21  3:05 PM   Specimen: Nasopharyngeal Swab; Nasopharyngeal(NP) swabs in vial transport medium  Result Value Ref Range Status    SARS Coronavirus 2 by RT PCR NEGATIVE NEGATIVE Final    Comment: (NOTE) SARS-CoV-2 target nucleic acids are NOT DETECTED.  The SARS-CoV-2 RNA is generally detectable in upper respiratory specimens during the acute phase of infection. The lowest concentration of SARS-CoV-2 viral copies this assay can detect  is 138 copies/mL. A negative result does not preclude SARS-Cov-2 infection and should not be used as the sole basis for treatment or other patient management decisions. A negative result may occur with  improper specimen collection/handling, submission of specimen other than nasopharyngeal swab, presence of viral mutation(s) within the areas targeted by this assay, and inadequate number of viral copies(<138 copies/mL). A negative result must be combined with clinical observations, patient history, and epidemiological information. The expected result is Negative.  Fact Sheet for Patients:  EntrepreneurPulse.com.au  Fact Sheet for Healthcare Providers:  IncredibleEmployment.be  This test is no t yet approved or cleared by the [place] FDA and  has been authorized for detection and/or diagnosis of SARS-CoV-2 by FDA under an Emergency Use Authorization (EUA). This EUA will remain  in effect (meaning this test can be used) for the duration of the COVID-19 declaration under Section 564(b)(1) of the Act, 21 U.S.C.section 360bbb-3(b)(1), unless the authorization is terminated  or revoked sooner.       Influenza A by PCR NEGATIVE NEGATIVE Final   Influenza B by PCR NEGATIVE NEGATIVE Final    Comment: (NOTE) The Xpert Xpress SARS-CoV-2/FLU/RSV plus assay is intended as an aid in the diagnosis of influenza from Nasopharyngeal swab specimens and should not be used as a sole basis for treatment. Nasal washings and aspirates are unacceptable for Xpert Xpress SARS-CoV-2/FLU/RSV testing.  Fact Sheet for  Patients: EntrepreneurPulse.com.au  Fact Sheet for Healthcare Providers: IncredibleEmployment.be  This test is not yet approved or cleared by the [place] FDA and has been authorized for detection and/or diagnosis of SARS-CoV-2 by FDA under an Emergency Use Authorization (EUA). This EUA will remain in effect (meaning this test can be used) for the duration of the COVID-19 declaration under Section 564(b)(1) of the Act, 21 U.S.C. section 360bbb-3(b)(1), unless the authorization is terminated or revoked.  Performed at [facility], [place] [name] [place], [place] [address]   MRSA PCR Screening     Status: None   Collection Time: 01/11/21 10:28 PM   Specimen: Nasal Mucosa; Nasopharyngeal  Result Value Ref Range Status   MRSA by PCR NEGATIVE NEGATIVE Final    Comment:        The GeneXpert MRSA Assay (FDA approved for NASAL specimens only), is one component of a comprehensive MRSA colonization surveillance program. It is not intended to diagnose MRSA infection nor to guide or monitor treatment for MRSA infections. Performed at [facility], [place] [address] [place], [place] [address]     RADIOLOGY STUDIES/RESULTS: DG Chest Portable 1 View  Result Date: 01/11/2021 CLINICAL DATA:  Abnormal breath sounds. Altered mental status and weakness. EXAM: PORTABLE CHEST 1 VIEW COMPARISON:  01/02/2021. FINDINGS: Trachea is midline. Heart is enlarged, stable. Lungs are somewhat low in volume but clear. No pleural fluid. IMPRESSION: No acute findings. Electronically Signed   By: Lorin Picket M.D.   On: 01/11/2021 13:40     LOS: 1 day   Oren Binet, MD  [facility]    To contact the attending provider between 7A-7P or the covering provider during after hours 7P-7A, please log into the web site [email or web address] and access using universal [place] password for that web site. If you do not have the password, please call the  hospital operator.  01/12/2021, 11:25 AM

## 2021-01-12 NOTE — Sedation Documentation (Signed)
Pt tolerated procedure very well. Total time: 13 mins 50 mcg fentanyl 1 mg versed 2 gm ancef  Report given to floor RN

## 2021-01-12 NOTE — Sedation Documentation (Signed)
Vital signs stable. Procedure started 

## 2021-01-12 NOTE — Sedation Documentation (Signed)
Vital signs stable. 

## 2021-01-12 NOTE — Procedures (Signed)
Pre-procedure Diagnosis: ESRD Post-procedure Diagnosis: Same  Successful placement of tunneled HD catheter with tips terminating within the superior aspect of the right atrium.    Complications: None Immediate EBL: Trace  The catheter is ready for immediate use.   Jay Francoise Chojnowski, MD Pager #: [phone]   

## 2021-01-12 NOTE — Progress Notes (Signed)
Patient leaving floor for dialysis at this time.

## 2021-01-12 NOTE — Progress Notes (Signed)
Upper extremity vein mapping completed. Refer to "CV Proc" under chart review to view preliminary results.  01/12/2021 1:53 PM Kelby Aline., MHA, RVT, RDCS, RDMS

## 2021-01-12 NOTE — Progress Notes (Signed)
Nephrology Follow-Up Consult note   Assessment/Recommendations: Kimberly [name] is a/an 30 y.o. female with a past medical history significant for CKD 5, DM1, HTN  who present w/ new ESRD  CKD 5 now considered ESRD: Progressive renal failure likely diabetic kidney disease now progressed to ESRD needing to start dialysis -IR consulted for [facility]; npo today and likely start HD today -No emergent indication for dialysis but plan to start today -Plan for daily dialysis for 3 days straight starting tomorrow -Involve renal coordinator and education team tomorrow -Consult VVS tomorrow  Volume Status: Appears acceptable.  Hold home Lasix  Hypertension: Continue hydralazine and amlodipine as ordered  Anemia: Hemoglobin stable at 10.3.  Recently significantly decreased.  Iron sat 16 and ferritin 71. Iron '250mg'$  x4 ordered.  Consider ESA.  Hypocalcemia/secondary hyperparathyroidism: Likely related to secondary hyperparathyroidism with metabolic bone disease secondary to CKD.  Increaseed calcitriol to 0.5 MCG daily.  Obtain PTH.  Phos 6.7; likely to improve with hd; consider binder.   Type 1 diabetes: Management per primary team.  Uncontrolled with hyperglycemia intermittently    Recommendations conveyed to primary service.    [facility] 01/12/2021 11:10 AM  ___________________________________________________________  CC: ESRD  Interval History/Subjective: Patient with no complaints today. NPO for [facility] placement.   Medications:  Current Facility-Administered Medications  Medication Dose Route Frequency Provider Last Rate Last Admin  . amLODipine (NORVASC) tablet 5 mg  5 mg Oral Daily Para Skeans, MD   5 mg at 01/12/21 0824  . calcitRIOL (ROCALTROL) capsule 0.5 mcg  0.5 mcg Oral Daily Reesa Chew, MD   0.5 mcg at 01/12/21 [id number]  . calcium carbonate (TUMS - dosed in mg elemental calcium) chewable tablet 400 mg of elemental calcium  2 tablet  Oral TID Para Skeans, MD   400 mg of elemental calcium at 01/12/21 0823  . Chlorhexidine Gluconate Cloth 2 % PADS 6 each  6 each Topical Q0600 Reesa Chew, MD   6 each at 01/12/21 0016  . [START ON 01/13/2021] heparin injection 5,000 Units  5,000 Units Subcutaneous Q8H Monia Sabal, PA-C      . hydrALAZINE (APRESOLINE) tablet 25 mg  25 mg Oral TID Para Skeans, MD   25 mg at 01/12/21 0824  . insulin pump   Subcutaneous TID WC, HS, 0200 Mansy, Arvella Merles, MD   Given at 01/12/21 [phone]  . metoprolol tartrate (LOPRESSOR) tablet 50 mg  50 mg Oral BID Para Skeans, MD   50 mg at 01/12/21 0824  . sodium bicarbonate tablet 650 mg  650 mg Oral BID Para Skeans, MD   650 mg at 01/12/21 [id number]      Review of Systems: 10 systems reviewed and negative except per interval history/subjective  Physical Exam: Vitals:   01/12/21 0330 01/12/21 0751  BP: 119/80 (!) 148/95  Pulse: 82 78  Resp: 17 18  Temp: 97.7 F (36.5 C) 98.7 F (37.1 C)  SpO2: 95% 99%   No intake/output data recorded. No intake or output data in the 24 hours ending 01/12/21 1110 Constitutional: well-appearing, no acute distress ENMT: ears and nose without scars or lesions, MMM CV: normal rate, trace edema in ble Respiratory: bilateral chest rise, normal work of breathing Gastrointestinal: soft, non-tender, no palpable masses or hernias Skin: no visible lesions or rashes Psych: alert, judgement/insight appropriate, appropriate mood and affect   Test Results I personally reviewed new and old clinical labs and radiology tests Lab Results  Component Value Date   NA 136 01/12/2021   K 4.4 01/12/2021   CL 105 01/12/2021   CO2 17 (L) 01/12/2021   BUN 70 (H) 01/12/2021   CREATININE 6.86 (H) 01/12/2021   CALCIUM 6.3 (LL) 01/12/2021   ALBUMIN 2.9 (L) 01/12/2021   PHOS 6.7 (H) 01/12/2021

## 2021-01-12 NOTE — Progress Notes (Signed)
Dr. Sidney Ace states okay for NPO order to be changed to NPO except sips with medications order.

## 2021-01-12 NOTE — Progress Notes (Addendum)
Tele called to inform RN of high QTB reading.Dr. Sidney Ace notified of patient's QTCB being greater than 500, with the most recent reading at 511. Written orders received to medicate with 40 mEq of potassium PO x 1. Will medicate as ordered and continue to monitor.

## 2021-01-12 NOTE — Progress Notes (Signed)
Patient's current CBG is 365. Patient has own insulin pump inserted in RLQ abomen. Patient has given herself 1.5 units at this time.

## 2021-01-12 NOTE — Sedation Documentation (Signed)
Report called to floor RN

## 2021-01-12 NOTE — Progress Notes (Signed)
Spoke with Dr. Sidney Ace in regards to needing orders for insulin pump. MD states patient has permission to use own insulin pump and that patient should continue to give herself half of insulin pumps recommended dose. Will continue to monitor.

## 2021-01-12 NOTE — Progress Notes (Signed)
Patient arrived to unit, patient bathed and telemetry monitor applied. Vitals obtained. Insulin pump in place to RLQ of abdomen. Patient instructed on NPO status after 0000 and oriented to room and call light, she verbalizes understanding. Call light within reach, will continue to monitor.

## 2021-01-12 NOTE — Sedation Documentation (Signed)
Patient is resting comfortably. 

## 2021-01-12 NOTE — Progress Notes (Signed)
Inpatient Diabetes Program Recommendations  AACE/ADA: New Consensus Statement on Inpatient Glycemic Control (2015)  Target Ranges:  Prepandial:   less than 140 mg/dL      Peak postprandial:   less than 180 mg/dL (1-2 hours)      Critically ill patients:  140 - 180 mg/dL   Lab Results  Component Value Date   GLUCAP 202 (H) 01/12/2021   HGBA1C 7.4 (H) 01/03/2021    Review of Glycemic Control  Diabetes history: DM1 Outpatient Diabetes medications: Insulin Pump 630 G Current orders for Inpatient glycemic control: Insulin Pump  Inpatient Diabetes Program Recommendations:   Spoke with patient and discussed event of hypoglycemia on admission. Patient states she was NPO in the ED waiting room and went low prior to getting back to a room to be seen. Patient saw endocrinologist Dr. Chalmers Cater last Thursday and agrees to call today to give update on starting hemodialysis and inquire if recommends further insulin pump adjustment @ this time. Patient knowledgeable and compliant with appointments and diabetes.  Thank you, Nani Gasser. Eean Buss, RN, MSN, CDE  Diabetes Coordinator Inpatient Glycemic Control Team Team Pager [phone] (8am-5pm) 01/12/2021 8:58 AM

## 2021-01-12 NOTE — Progress Notes (Signed)
Renal Navigator and HD RN Coordinator/K. Moore met with patient briefly until she was picked up for vein mapping. Prior to leaving her room, patient was pleasant and welcoming of visit. We introduced selves and explained roles/reason for visit. Patient states she is feeling ok and was sent here by her Nephrologist/Dr. Johnney Ou, with whom she has followed for the past 3 years. She seems to have some knowledge about starting HD, including renal diet, etc. Navigator inquired about her address noted in Epic. She states, "that is just my mailing address. I live here." She provides [address] [place], [place], [place] as her physical address. We did not get much farther in the visit before she had to leave. After this, Navigator spoke with Dr. Johnney Ou to see about her discussions regarding modality have been with this patient. Per Dr. Johnney Ou, patient had been planning PD, however, told Dr. Johnney Ou that she will be moving in two weeks, so she will not be a candidate for the Transitional Care Unit (TCU). Therefore, Navigator will set her up for outpatient HD at the clinic closest to her address here with a seat to accommodate a new patient and she will need to request a transfer from her clinic to a clinic near her new address once she has moved. Dr. Johnney Ou notes that she and patient were also hoping she would be able to get moved/settled before she needed to start HD, but of course, this was not the case.  Navigator will submit referral to [facility] and follow up with patient.  Alphonzo Cruise, [place] Renal Navigator [phone]

## 2021-01-13 DIAGNOSIS — K219 Gastro-esophageal reflux disease without esophagitis: Secondary | ICD-10-CM

## 2021-01-13 LAB — GLUCOSE, CAPILLARY
Glucose-Capillary: 179 mg/dL — ABNORMAL HIGH (ref 70–99)
Glucose-Capillary: 124 mg/dL — ABNORMAL HIGH (ref 70–99)
Glucose-Capillary: 64 mg/dL — ABNORMAL LOW (ref 70–99)
Glucose-Capillary: 153 mg/dL — ABNORMAL HIGH (ref 70–99)
Glucose-Capillary: 89 mg/dL (ref 70–99)

## 2021-01-13 LAB — RAPID URINE DRUG SCREEN, HOSP PERFORMED
Amphetamines: NOT DETECTED
Barbiturates: NOT DETECTED
Benzodiazepines: POSITIVE — AB
Cocaine: NOT DETECTED
Opiates: NOT DETECTED
Tetrahydrocannabinol: NOT DETECTED

## 2021-01-13 LAB — RENAL FUNCTION PANEL
Albumin: 2.6 g/dL — ABNORMAL LOW (ref 3.5–5.0)
Anion gap: 10 (ref 5–15)
BUN: 39 mg/dL — ABNORMAL HIGH (ref 6–20)
CO2: 23 mmol/L (ref 22–32)
Calcium: 6.9 mg/dL — ABNORMAL LOW (ref 8.9–10.3)
Chloride: 106 mmol/L (ref 98–111)
Creatinine, Ser: 4.89 mg/dL — ABNORMAL HIGH (ref 0.44–1.00)
GFR, Estimated: 11 mL/min — ABNORMAL LOW (ref >60)
Glucose, Bld: 150 mg/dL — ABNORMAL HIGH (ref 70–99)
Phosphorus: 3.4 mg/dL (ref 2.5–4.6)
Potassium: 3.5 mmol/L (ref 3.5–5.1)
Sodium: 139 mmol/L (ref 135–145)

## 2021-01-13 LAB — URINALYSIS, ROUTINE W REFLEX MICROSCOPIC
Bilirubin Urine: NEGATIVE
Glucose, UA: 50 mg/dL — AB
Ketones, ur: NEGATIVE mg/dL
Leukocytes,Ua: NEGATIVE
Nitrite: NEGATIVE
Protein, ur: 100 mg/dL — AB
Specific Gravity, Urine: 1.011 (ref 1.005–1.030)
pH: 6 (ref 5.0–8.0)

## 2021-01-13 LAB — CBC
HCT: 28.3 % — ABNORMAL LOW (ref 36.0–46.0)
Hemoglobin: 9.5 g/dL — ABNORMAL LOW (ref 12.0–15.0)
MCH: 26 pg (ref 26.0–34.0)
MCHC: 33.6 g/dL (ref 30.0–36.0)
MCV: 77.3 fL — ABNORMAL LOW (ref 80.0–100.0)
Platelets: 157 10*3/uL (ref 150–400)
RBC: 3.66 MIL/uL — ABNORMAL LOW (ref 3.87–5.11)
RDW: 15.1 % (ref 11.5–15.5)
WBC: 11.4 10*3/uL — ABNORMAL HIGH (ref 4.0–10.5)
nRBC: 0 % (ref 0.0–0.2)

## 2021-01-13 LAB — PARATHYROID HORMONE, INTACT (NO CA): PTH: 157 pg/mL — ABNORMAL HIGH (ref 15–65)

## 2021-01-13 MED ORDER — CEFAZOLIN SODIUM-DEXTROSE 2-4 GM/100ML-% IV SOLN: 2.0000 g | INTRAVENOUS | Status: DC

## 2021-01-13 MED ORDER — SODIUM CHLORIDE 0.9% FLUSH: 10.0000 mL | INTRAVENOUS | Status: DC | PRN

## 2021-01-13 MED ORDER — SODIUM CHLORIDE 0.9% FLUSH
10.0000 mL | Freq: Two times a day (BID) | INTRAVENOUS | Status: DC
  Administered 2021-01-13 – 2021-01-15 (×4): 10 mL

## 2021-01-13 NOTE — Progress Notes (Signed)
PROGRESS NOTE        PATIENT DETAILS Name: Kimberly [name] Age: 30 y.o. Sex: female Date of Birth: [birth year] Admit Date: 01/11/2021 Admitting Physician Para Skeans, MD [id number], Mindi Curling, MD  Brief Narrative: Patient is a 30 y.o. female with a history of CKD stage V, DM-1 on insulin pump, HTN-presented with altered mental status/weakness-thought to be due to progression of CKD stage V to ESRD-subsequently admitted to the hospitalist service.  See below for further details.  Significant events: 5/16>> admit for lethargy/AMS-due to progression of CKD stage V to ESRD.  Significant studies: 5/8>> Echo: EF 55-60%, moderate LVH, grade 2 diastolic dysfunction. 5/16>> chest x-ray: No pneumonia  Antimicrobial therapy: None  Microbiology data: 5/16>> influenza/COVID PCR: Negative  Procedures : None  Consults: Nephrology, IR, vascular surgery  DVT Prophylaxis : heparin injection 5,000 Units Start: 01/13/21 0600 SCDs Start: 01/11/21 1611   Subjective: No major events overnight-tolerated dialysis fairly well per patient.  Volume status is improving.  Assessment/Plan: Lethargy/mild metabolic encephalopathy: Resolved with initiation of HD.  Progression of CKD stage V to ESRD: Nephrology following-plans to start hemodialysis once TDC placed by IR.  Vascular surgery planning on AV fistula placement tomorrow  HTN: BP reasonable-continue metoprolol/hydralazine/amlodipine.  Anemia: Due to CKD-IV iron/ESA per nephrology.  DM-1: Mild episode of hypoglycemia earlier this morning-otherwise CBG stable-patient reached out to her primary endocrinologist-Dr. Doree Barthel was advised to continue her usual regimen on her insulin pump.  Continue to watch closely.  Recent Labs    01/13/21 0634 01/13/21 0812 01/13/21 0925  GLUCAP 124* 64* 153*    Diet: Diet Order            Diet NPO time specified Except for: Sips with Meds  Diet effective midnight            Diet renal/carb modified with fluid restriction Diet-HS Snack? Nothing; Fluid restriction: 1500 mL Fluid; Room service appropriate? Yes; Fluid consistency: Thin  Diet effective now                  Code Status: Full code  Family Communication: None at bedside  Disposition Plan: Status is: Inpatient  Remains inpatient appropriate because:Inpatient level of care appropriate due to severity of illness   Dispo: The patient is from: Home              Anticipated d/c is to: Home              Patient currently is not medically stable to d/c.   Difficult to place patient No    Barriers to Discharge: New ESRD-initiating HD-we will need outpatient HD arrangements before discharge.  Antimicrobial agents: Anti-infectives (From admission, onward)   Start     Dose/Rate Route Frequency Ordered Stop   01/14/21 0000  ceFAZolin (ANCEF) IVPB 2g/100 mL premix       Note to Pharmacy: Send with pt to OR   2 g 200 mL/hr over 30 Minutes Intravenous On call 01/13/21 0612 01/15/21 0000   01/12/21 1715  ceFAZolin (ANCEF) IVPB 2g/100 mL premix        2 g 200 mL/hr over 30 Minutes Intravenous  Once 01/12/21 1626 01/12/21 1658   01/12/21 0800  ceFAZolin (ANCEF) IVPB 2g/100 mL premix        2 g 200 mL/hr over 30 Minutes Intravenous To Radiology 01/12/21 0709 01/12/21 0903  Time spent: 25 minutes-Greater than 50% of this time was spent in counseling, explanation of diagnosis, planning of further management, and coordination of care.  MEDICATIONS: Scheduled Meds: . amLODipine  5 mg Oral Daily  . calcitRIOL  0.5 mcg Oral Daily  . calcium carbonate  2 tablet Oral TID  . Chlorhexidine Gluconate Cloth  6 each Topical Q0600  . heparin  5,000 Units Subcutaneous Q8H  . insulin pump   Subcutaneous TID WC, HS, 0200  . metoprolol tartrate  50 mg Oral BID  . sodium bicarbonate  650 mg Oral BID   Continuous Infusions: . [START ON 01/14/2021]  ceFAZolin (ANCEF) IV    . ferric gluconate  (FERRLECIT/NULECIT) IV 250 mg (01/13/21 0911)   PRN Meds:.   PHYSICAL EXAM: Vital signs: Vitals:   01/12/21 2310 01/12/21 2353 01/12/21 2355 01/13/21 0300  BP: (!) 161/92 (!) 149/94  118/77  Pulse: 90  85 82  Resp: 18   18  Temp: 98 F (36.7 C)   98.7 F (37.1 C)  TempSrc: Oral   Oral  SpO2: 96%   97%  Weight:      Height:       Filed Weights   01/11/21 2301 01/12/21 1940 01/12/21 2147  Weight: 74.5 kg 74.9 kg 75.6 kg   Body mass index is 27.73 kg/m.   Gen Exam:Alert awake-not in any distress HEENT:atraumatic, normocephalic Chest: B/L clear to auscultation anteriorly CVS:S1S2 regular Abdomen:soft non tender, non distended Extremities:no edema Neurology: Non focal Skin: no rash  I have personally reviewed following labs and imaging studies  LABORATORY DATA: CBC: Recent Labs  Lab 01/11/21 1153 01/12/21 1948 01/13/21 0109  WBC 8.9 10.1 11.4*  NEUTROABS 6.1  --   --   HGB 10.3* 9.7* 9.5*  HCT 30.8* 29.1* 28.3*  MCV 78.6* 76.8* 77.3*  PLT 232 233 829    Basic Metabolic Panel: Recent Labs  Lab 01/11/21 1153 01/11/21 2303 01/12/21 0259 01/13/21 0109  NA 140  --  136 139  K 3.2*  --  4.4 3.5  CL 108  --  105 106  CO2 19*  --  17* 23  GLUCOSE 44*  --  349* 150*  BUN 61*  --  70* 39*  CREATININE 6.97*  --  6.86* 4.89*  CALCIUM 6.1*  --  6.3* 6.9*  MG 2.0  --   --   --   PHOS  --  7.3* 6.7* 3.4    GFR: Estimated Creatinine Clearance: 16.8 mL/min (A) (by C-G formula based on SCr of 4.89 mg/dL (H)).  Liver Function Tests: Recent Labs  Lab 01/11/21 1153 01/12/21 0259 01/13/21 0109  AST 10*  --   --   ALT 10  --   --   ALKPHOS 91  --   --   BILITOT 0.4  --   --   PROT 6.9  --   --   ALBUMIN 3.1* 2.9* 2.6*   No results for input(s): LIPASE, AMYLASE in the last 168 hours. No results for input(s): AMMONIA in the last 168 hours.  Coagulation Profile: Recent Labs  Lab 01/11/21 1153  INR 1.0    Cardiac Enzymes: No results for input(s):  CKTOTAL, CKMB, CKMBINDEX, TROPONINI in the last 168 hours.  BNP (last 3 results) No results for input(s): PROBNP in the last 8760 hours.  Lipid Profile: No results for input(s): CHOL, HDL, LDLCALC, TRIG, CHOLHDL, LDLDIRECT in the last 72 hours.  Thyroid Function Tests: No results for input(s): TSH,  T4TOTAL, FREET4, T3FREE, THYROIDAB in the last 72 hours.  Anemia Panel: Recent Labs    01/11/21 2303  FERRITIN 71  TIBC 354  IRON 55    Urine analysis:    Component Value Date/Time   COLORURINE YELLOW 01/13/2021 0357   APPEARANCEUR CLEAR 01/13/2021 0357   LABSPEC 1.011 01/13/2021 0357   PHURINE 6.0 01/13/2021 0357   GLUCOSEU 50 (A) 01/13/2021 0357   HGBUR MODERATE (A) 01/13/2021 0357   BILIRUBINUR NEGATIVE 01/13/2021 Spray 01/13/2021 0357   PROTEINUR 100 (A) 01/13/2021 0357   UROBILINOGEN 0.2 03/21/2015 2010   NITRITE NEGATIVE 01/13/2021 0357   LEUKOCYTESUR NEGATIVE 01/13/2021 0357    Sepsis Labs: Lactic Acid, Venous    Component Value Date/Time   LATICACIDVEN 1.93 (H) 05/21/2018 1143    MICROBIOLOGY: Recent Results (from the past 240 hour(s))  Resp Panel by RT-PCR (Flu A&B, Covid) Nasopharyngeal Swab     Status: None   Collection Time: 01/11/21  3:05 PM   Specimen: Nasopharyngeal Swab; Nasopharyngeal(NP) swabs in vial transport medium  Result Value Ref Range Status   SARS Coronavirus 2 by RT PCR NEGATIVE NEGATIVE Final    Comment: (NOTE) SARS-CoV-2 target nucleic acids are NOT DETECTED.  The SARS-CoV-2 RNA is generally detectable in upper respiratory specimens during the acute phase of infection. The lowest concentration of SARS-CoV-2 viral copies this assay can detect is 138 copies/mL. A negative result does not preclude SARS-Cov-2 infection and should not be used as the sole basis for treatment or other patient management decisions. A negative result may occur with  improper specimen collection/handling, submission of specimen other than  nasopharyngeal swab, presence of viral mutation(s) within the areas targeted by this assay, and inadequate number of viral copies(<138 copies/mL). A negative result must be combined with clinical observations, patient history, and epidemiological information. The expected result is Negative.  Fact Sheet for Patients:  EntrepreneurPulse.com.au  Fact Sheet for Healthcare Providers:  IncredibleEmployment.be  This test is no t yet approved or cleared by the [place] FDA and  has been authorized for detection and/or diagnosis of SARS-CoV-2 by FDA under an Emergency Use Authorization (EUA). This EUA will remain  in effect (meaning this test can be used) for the duration of the COVID-19 declaration under Section 564(b)(1) of the Act, 21 U.S.C.section 360bbb-3(b)(1), unless the authorization is terminated  or revoked sooner.       Influenza A by PCR NEGATIVE NEGATIVE Final   Influenza B by PCR NEGATIVE NEGATIVE Final    Comment: (NOTE) The Xpert Xpress SARS-CoV-2/FLU/RSV plus assay is intended as an aid in the diagnosis of influenza from Nasopharyngeal swab specimens and should not be used as a sole basis for treatment. Nasal washings and aspirates are unacceptable for Xpert Xpress SARS-CoV-2/FLU/RSV testing.  Fact Sheet for Patients: EntrepreneurPulse.com.au  Fact Sheet for Healthcare Providers: IncredibleEmployment.be  This test is not yet approved or cleared by the [place] FDA and has been authorized for detection and/or diagnosis of SARS-CoV-2 by FDA under an Emergency Use Authorization (EUA). This EUA will remain in effect (meaning this test can be used) for the duration of the COVID-19 declaration under Section 564(b)(1) of the Act, 21 U.S.C. section 360bbb-3(b)(1), unless the authorization is terminated or revoked.  Performed at [facility], [place] [address] [place], [place] [address]    MRSA PCR Screening     Status: None   Collection Time: 01/11/21 10:28 PM   Specimen: Nasal Mucosa; Nasopharyngeal  Result Value Ref  Range Status   MRSA by PCR NEGATIVE NEGATIVE Final    Comment:        The GeneXpert MRSA Assay (FDA approved for NASAL specimens only), is one component of a comprehensive MRSA colonization surveillance program. It is not intended to diagnose MRSA infection nor to guide or monitor treatment for MRSA infections. Performed at [facility], [place] [address], [place], [place] [address]     RADIOLOGY STUDIES/RESULTS: IR Fluoro Guide CV Line Right  Result Date: 01/13/2021 INDICATION: Development of ESRD, in need of intravenous access for the initiation of hemodialysis. EXAM: TUNNELED CENTRAL VENOUS HEMODIALYSIS CATHETER PLACEMENT WITH ULTRASOUND AND FLUOROSCOPIC GUIDANCE MEDICATIONS: The patient is admitted to the hospital and receiving antibiotics. The antibiotic was given in an appropriate time interval prior to skin puncture. ANESTHESIA/SEDATION: Moderate (conscious) sedation was employed during this procedure. A total of Versed 1 mg and Fentanyl 50 mcg was administered intravenously. Moderate Sedation Time: 14 minutes. The patient's level of consciousness and vital signs were monitored continuously by radiology nursing throughout the procedure under my direct supervision. FLUOROSCOPY TIME:  18 seconds (3 mGy). COMPLICATIONS: None immediate. PROCEDURE: Informed written consent was obtained from the patient after a discussion of the risks, benefits, and alternatives to treatment. Questions regarding the procedure were encouraged and answered. The right neck and chest were prepped with chlorhexidine in a sterile fashion, and a sterile drape was applied covering the operative field. Maximum barrier sterile technique with sterile gowns and gloves were used for the procedure. A timeout was performed prior to the initiation of the procedure. After creating a small  venotomy incision, a micropuncture kit was utilized to access the internal jugular vein. Real-time ultrasound guidance was utilized for vascular access including the acquisition of a permanent ultrasound image documenting patency of the accessed vessel. The microwire was utilized to measure appropriate catheter length. A stiff Glidewire was advanced to the level of the IVC and the micropuncture sheath was exchanged for a peel-away sheath. A Palindrome tunneled hemodialysis catheter measuring 19 cm from tip to cuff was tunneled in a retrograde fashion from the anterior chest wall to the venotomy incision. The catheter was then placed through the peel-away sheath with tips ultimately positioned within the superior aspect of the right atrium. Final catheter positioning was confirmed and documented with a spot radiographic image. The catheter aspirates and flushes normally. The catheter was flushed with appropriate volume heparin dwells. The catheter exit site was secured with a 0-Prolene retention suture. The venotomy incision was closed with Dermabond and Steri-strips. Dressings were applied. The patient tolerated the procedure well without immediate post procedural complication. IMPRESSION: Successful placement of 19 cm tip to cuff tunneled hemodialysis catheter via the right internal jugular vein with tips terminating within the superior aspect of the right atrium. The catheter is ready for immediate use. Electronically Signed   By: Sandi Mariscal M.D.   On: 01/13/2021 08:24   IR US Guide Vasc Access Right  Result Date: 01/13/2021 INDICATION: Development of ESRD, in need of intravenous access for the initiation of hemodialysis. EXAM: TUNNELED CENTRAL VENOUS HEMODIALYSIS CATHETER PLACEMENT WITH ULTRASOUND AND FLUOROSCOPIC GUIDANCE MEDICATIONS: The patient is admitted to the hospital and receiving antibiotics. The antibiotic was given in an appropriate time interval prior to skin puncture. ANESTHESIA/SEDATION:  Moderate (conscious) sedation was employed during this procedure. A total of Versed 1 mg and Fentanyl 50 mcg was administered intravenously. Moderate Sedation Time: 14 minutes. The patient's level of consciousness and vital signs were monitored  continuously by radiology nursing throughout the procedure under my direct supervision. FLUOROSCOPY TIME:  18 seconds (3 mGy). COMPLICATIONS: None immediate. PROCEDURE: Informed written consent was obtained from the patient after a discussion of the risks, benefits, and alternatives to treatment. Questions regarding the procedure were encouraged and answered. The right neck and chest were prepped with chlorhexidine in a sterile fashion, and a sterile drape was applied covering the operative field. Maximum barrier sterile technique with sterile gowns and gloves were used for the procedure. A timeout was performed prior to the initiation of the procedure. After creating a small venotomy incision, a micropuncture kit was utilized to access the internal jugular vein. Real-time ultrasound guidance was utilized for vascular access including the acquisition of a permanent ultrasound image documenting patency of the accessed vessel. The microwire was utilized to measure appropriate catheter length. A stiff Glidewire was advanced to the level of the IVC and the micropuncture sheath was exchanged for a peel-away sheath. A Palindrome tunneled hemodialysis catheter measuring 19 cm from tip to cuff was tunneled in a retrograde fashion from the anterior chest wall to the venotomy incision. The catheter was then placed through the peel-away sheath with tips ultimately positioned within the superior aspect of the right atrium. Final catheter positioning was confirmed and documented with a spot radiographic image. The catheter aspirates and flushes normally. The catheter was flushed with appropriate volume heparin dwells. The catheter exit site was secured with a 0-Prolene retention suture.  The venotomy incision was closed with Dermabond and Steri-strips. Dressings were applied. The patient tolerated the procedure well without immediate post procedural complication. IMPRESSION: Successful placement of 19 cm tip to cuff tunneled hemodialysis catheter via the right internal jugular vein with tips terminating within the superior aspect of the right atrium. The catheter is ready for immediate use. Electronically Signed   By: Sandi Mariscal M.D.   On: 01/13/2021 08:24   VAS [place] UPPER EXT VEIN MAPPING (PRE-OP AVF)  Result Date: 01/12/2021 UPPER EXTREMITY VEIN MAPPING Patient Name:  Kimberly [name]  Date of Exam:   01/12/2021 Medical Rec #: [id number]          Accession #:    [id number] Date of Birth: 05-01-1991          Patient Gender: F Patient Age:   30Y Exam Location:  [facility] Procedure:      VAS [place] UPPER EXT VEIN MAPPING (PRE-OP AVF) Referring Phys: [id number] SAMANTHA J RHYNE --------------------------------------------------------------------------------  Indications: Pre-access. History: CKD.  Limitations: Vein size Comparison Study: No prior study Performing Technologist: Maudry Mayhew MHA, RDMS, RVT, RDCS  Examination Guidelines: A complete evaluation includes B-mode imaging, spectral Doppler, color Doppler, and power Doppler as needed of all accessible portions of each vessel. Bilateral testing is considered an integral part of a complete examination. Limited examinations for reoccurring indications may be performed as noted. +-----------------+-------------+----------+--------------+ Right Cephalic   Diameter (cm)Depth (cm)   Findings    +-----------------+-------------+----------+--------------+ Shoulder             0.05                              +-----------------+-------------+----------+--------------+ Prox upper arm       0.07                              +-----------------+-------------+----------+--------------+ Mid upper arm  not  visualized +-----------------+-------------+----------+--------------+ Dist upper arm                          not visualized +-----------------+-------------+----------+--------------+ Antecubital fossa                       not visualized +-----------------+-------------+----------+--------------+ Prox forearm                            not visualized +-----------------+-------------+----------+--------------+ Mid forearm                             not visualized +-----------------+-------------+----------+--------------+ Dist forearm                            not visualized +-----------------+-------------+----------+--------------+ Wrist                                   not visualized +-----------------+-------------+----------+--------------+ +-----------------+-------------+----------+--------------+ Right Basilic    Diameter (cm)Depth (cm)   Findings    +-----------------+-------------+----------+--------------+ Mid upper arm        0.18                              +-----------------+-------------+----------+--------------+ Dist upper arm       0.23                              +-----------------+-------------+----------+--------------+ Antecubital fossa                       not visualized +-----------------+-------------+----------+--------------+ Prox forearm                            not visualized +-----------------+-------------+----------+--------------+ Mid forearm                             not visualized +-----------------+-------------+----------+--------------+ Distal forearm                          not visualized +-----------------+-------------+----------+--------------+ Wrist                                   not visualized +-----------------+-------------+----------+--------------+ +-----------------+-------------+----------+--------------+ Left Cephalic    Diameter (cm)Depth (cm)   Findings     +-----------------+-------------+----------+--------------+ Shoulder             0.06                              +-----------------+-------------+----------+--------------+ Prox upper arm       0.07                              +-----------------+-------------+----------+--------------+ Mid upper arm        0.08                              +-----------------+-------------+----------+--------------+ Dist upper arm  not visualized +-----------------+-------------+----------+--------------+ Antecubital fossa                       not visualized +-----------------+-------------+----------+--------------+ Prox forearm                            not visualized +-----------------+-------------+----------+--------------+ Mid forearm                             not visualized +-----------------+-------------+----------+--------------+ Dist forearm                            not visualized +-----------------+-------------+----------+--------------+ Wrist                                   not visualized +-----------------+-------------+----------+--------------+ +-----------------+-------------+----------+--------------+ Left Basilic     Diameter (cm)Depth (cm)   Findings    +-----------------+-------------+----------+--------------+ Mid upper arm        0.21                              +-----------------+-------------+----------+--------------+ Dist upper arm       0.19                              +-----------------+-------------+----------+--------------+ Antecubital fossa                       not visualized +-----------------+-------------+----------+--------------+ Prox forearm                            not visualized +-----------------+-------------+----------+--------------+ Mid forearm                             not visualized +-----------------+-------------+----------+--------------+ Distal forearm                           not visualized +-----------------+-------------+----------+--------------+ Wrist                                   not visualized +-----------------+-------------+----------+--------------+ *See table(s) above for measurements and observations.  Diagnosing physician: Deitra Mayo MD Electronically signed by Deitra Mayo MD on 01/12/2021 at 3:45:12 PM.    Final      LOS: 2 days   Oren Binet, MD  [facility]    To contact the attending provider between 7A-7P or the covering provider during after hours 7P-7A, please log into the web site [email or web address] and access using universal [place] password for that web site. If you do not have the password, please call the hospital operator.  01/13/2021, 2:14 PM

## 2021-01-13 NOTE — Progress Notes (Signed)
   VASCULAR SURGERY ASSESSMENT & PLAN:   ESRD: Based on her vein map yesterday, she does not appear to be a candidate for an AV fistula.  She is scheduled for placement of a left arm AV graft tomorrow.  The IV has been removed from her left arm.  I have written preop orders.  I have again discussed the procedure with her and answered all her questions.  RENAL: Please do not dialyze patient tomorrow as she is scheduled for surgery in the morning.  SUBJECTIVE:   No specific complaints.  PHYSICAL EXAM:   Vitals:   01/12/21 2310 01/12/21 2353 01/12/21 2355 01/13/21 0300  BP: (!) 161/92 (!) 149/94  118/77  Pulse: 90  85 82  Resp: 18   18  Temp: 98 F (36.7 C)   98.7 F (37.1 C)  TempSrc: Oral   Oral  SpO2: 96%   97%  Weight:      Height:       Palpable left radial pulse.  DATA:   VEIN MAP: I have independently interpreted her vein map.  She does not appear to have a usable cephalic vein or basilic vein on either side.  Lab Results  Component Value Date   WBC 11.4 (H) 01/13/2021   HGB 9.5 (L) 01/13/2021   HCT 28.3 (L) 01/13/2021   MCV 77.3 (L) 01/13/2021   PLT 157 01/13/2021   Lab Results  Component Value Date   CREATININE 4.89 (H) 01/13/2021    CBG (last 3)  Recent Labs    01/12/21 1725 01/12/21 2348 01/13/21 0307  GLUCAP 95 128* 179*    PROBLEM LIST:    Principal Problem:   ESRD (end stage renal disease) ([place]) Active Problems:   Type 1 diabetes mellitus with diabetic nephropathy ([facility])   GERD (gastroesophageal reflux disease)   Hypertension associated with diabetes ([place])   Hypoglycemia due to insulin   CURRENT MEDS:   . amLODipine  5 mg Oral Daily  . calcitRIOL  0.5 mcg Oral Daily  . calcium carbonate  2 tablet Oral TID  . Chlorhexidine Gluconate Cloth  6 each Topical Q0600  . heparin  5,000 Units Subcutaneous Q8H  . hydrALAZINE  25 mg Oral TID  . insulin pump   Subcutaneous TID WC, HS, 0200  . metoprolol tartrate  50 mg Oral BID  . sodium  bicarbonate  650 mg Oral BID    Deitra Mayo Office: [phone] 01/13/2021

## 2021-01-13 NOTE — Progress Notes (Signed)
Nephrology Follow-Up Consult note   Assessment/Recommendations: Kimberly [name] is a/an 30 y.o. female with a past medical history significant for CKD 5, DM1, HTN  who present w/ new ESRD  CKD 5 now considered ESRD: Progressive renal failure likely diabetic kidney disease now progressed to ESRD needing to start dialysis -IR placed TDC on 5/17, appreciate help -Second of 3 initiation dialysis sessions today.  Next dialysis Friday -Undergoing AVG tomorrow; appreciate help from Dr. Scot Dock with [facility] -Involve renal coordinator and education team  Volume Status: Appears acceptable.  Hold home Lasix. Volume mgmt with hd  Hypertension: Hypotension with dialysis.  Will decrease stop hydralazine.  Continue amlodipine  Anemia: Hemoglobin stable at 10.3.  Recently significantly decreased.  Iron sat 16 and ferritin 71. Iron '250mg'$  x4 ordered.  Consider ESA.  Hypocalcemia/secondary hyperparathyroidism: Likely related to secondary hyperparathyroidism with metabolic bone disease secondary to CKD.  Increaseed calcitriol to 0.5 MCG daily.  Obtain PTH.  Phos normal after hd  Type 1 diabetes: Management per primary team.  Uncontrolled with hyperglycemia intermittently    Recommendations conveyed to primary service.    [facility] 01/13/2021 11:30 AM  ___________________________________________________________  CC: ESRD  Interval History/Subjective: Patient feels well today.  Received TDC yesterday.  Also underwent dialysis with mild hypotension but otherwise no significant issues.  Has some edema but feels like her volume status is pretty good.   Medications:  Current Facility-Administered Medications  Medication Dose Route Frequency Provider Last Rate Last Admin  . amLODipine (NORVASC) tablet 5 mg  5 mg Oral Daily Para Skeans, MD   5 mg at 01/13/21 0906  . calcitRIOL (ROCALTROL) capsule 0.5 mcg  0.5 mcg Oral Daily Reesa Chew, MD   0.5 mcg at  01/13/21 0906  . calcium carbonate (TUMS - dosed in mg elemental calcium) chewable tablet 400 mg of elemental calcium  2 tablet Oral TID Para Skeans, MD   400 mg of elemental calcium at 01/13/21 0906  . [START ON 01/14/2021] ceFAZolin (ANCEF) IVPB 2g/100 mL premix  2 g Intravenous On Call Angelia Mould, MD      . Chlorhexidine Gluconate Cloth 2 % PADS 6 each  6 each Topical Q0600 Reesa Chew, MD   6 each at 01/13/21 0542  . ferric gluconate (FERRLECIT) 250 mg in sodium chloride 0.9 % 100 mL IVPB  250 mg Intravenous Daily Reesa Chew, MD 120 mL/hr at 01/13/21 0911 250 mg at 01/13/21 0911  . heparin injection 5,000 Units  5,000 Units Subcutaneous Q8H Monia Sabal, PA-C   5,000 Units at 01/13/21 [id number]  . hydrALAZINE (APRESOLINE) tablet 25 mg  25 mg Oral TID Para Skeans, MD   25 mg at 01/13/21 0906  . insulin pump   Subcutaneous TID WC, HS, 0200 Mansy, Arvella Merles, MD   Given at 01/13/21 0327  . metoprolol tartrate (LOPRESSOR) tablet 50 mg  50 mg Oral BID Para Skeans, MD   50 mg at 01/13/21 0906  . sodium bicarbonate tablet 650 mg  650 mg Oral BID Para Skeans, MD   650 mg at 01/13/21 [id number]      Review of Systems: 10 systems reviewed and negative except per interval history/subjective  Physical Exam: Vitals:   01/12/21 2355 01/13/21 0300  BP:  118/77  Pulse: 85 82  Resp:  18  Temp:  98.7 F (37.1 C)  SpO2:  97%   No intake/output data recorded.  Intake/Output Summary (Last 24  hours) at 01/13/2021 1130 Last data filed at 01/13/2021 0600 Gross per 24 hour  Intake 718.68 ml  Output -415 ml  Net 1133.68 ml   Constitutional: well-appearing, no acute distress ENMT: ears and nose without scars or lesions, MMM CV: normal rate, trace edema in ble Respiratory: bilateral chest rise, normal work of breathing Gastrointestinal: soft, non-tender, no palpable masses or hernias Skin: no visible lesions or rashes Psych: alert, judgement/insight appropriate, appropriate mood and  affect   Test Results I personally reviewed new and old clinical labs and radiology tests Lab Results  Component Value Date   NA 139 01/13/2021   K 3.5 01/13/2021   CL 106 01/13/2021   CO2 23 01/13/2021   BUN 39 (H) 01/13/2021   CREATININE 4.89 (H) 01/13/2021   CALCIUM 6.9 (L) 01/13/2021   ALBUMIN 2.6 (L) 01/13/2021   PHOS 3.4 01/13/2021

## 2021-01-13 NOTE — Progress Notes (Signed)
Renal Navigator met with patient to follow up from conversation yesterday that was prematurely ended when she needed to be taken for procedure. Patient was again pleasant and welcoming.  Navigator discussed conversation with Dr. Johnney Ou yesterday, and patient confirms plans to move on January 30, 2021 to [facility], [place] to be close to family, who can support her since she is starting HD. She provided the address of where she will be living once she moves as: [address] [place], [place] [address].  Patient is eager to discharge from the hospital. She had HD yesterday, is scheduled for HD again today, and is scheduled for AVG tomorrow morning. Navigator will attempt to get things arranged at an outpatient HD clinic on a MWF schedule, if possible, so that she may be able to discharge tomorrow after surgery and start at outpatient clinic, tx #3, on Friday. Navigator submitted referral to [facility] and requested [facility], as this is the closest to patient's home in [place]. Navigator spoke with clinic manager to explain patient's situation.  Patient is interested in PD and has questions about whether she can transition to this now that she has started HD. Navigator informed her that she can, as long as she is transferred to a clinic who can accommodate this. Navigator told clinic manager at [facility] patient's future wishes for PD as well.  Navigator will continue to follow closely. Plan above is currently still tentative. Navigator will need to check with [facility] regarding Medical Director acceptance and Financial Clearance before patient can be discharged.   Alphonzo Cruise, [place] [facility]al Navigator [phone]

## 2021-01-13 NOTE — H&P (View-Only) (Signed)
   VASCULAR SURGERY ASSESSMENT & PLAN:   ESRD: Based on her vein map yesterday, she does not appear to be a candidate for an AV fistula.  She is scheduled for placement of a left arm AV graft tomorrow.  The IV has been removed from her left arm.  I have written preop orders.  I have again discussed the procedure with her and answered all her questions.  RENAL: Please do not dialyze patient tomorrow as she is scheduled for surgery in the morning.  SUBJECTIVE:   No specific complaints.  PHYSICAL EXAM:   Vitals:   01/12/21 2310 01/12/21 2353 01/12/21 2355 01/13/21 0300  BP: (!) 161/92 (!) 149/94  118/77  Pulse: 90  85 82  Resp: 18   18  Temp: 98 F (36.7 C)   98.7 F (37.1 C)  TempSrc: Oral   Oral  SpO2: 96%   97%  Weight:      Height:       Palpable left radial pulse.  DATA:   VEIN MAP: I have independently interpreted her vein map.  She does not appear to have a usable cephalic vein or basilic vein on either side.  Lab Results  Component Value Date   WBC 11.4 (H) 01/13/2021   HGB 9.5 (L) 01/13/2021   HCT 28.3 (L) 01/13/2021   MCV 77.3 (L) 01/13/2021   PLT 157 01/13/2021   Lab Results  Component Value Date   CREATININE 4.89 (H) 01/13/2021    CBG (last 3)  Recent Labs    01/12/21 1725 01/12/21 2348 01/13/21 0307  GLUCAP 95 128* 179*    PROBLEM LIST:    Principal Problem:   ESRD (end stage renal disease) ([place]) Active Problems:   Type 1 diabetes mellitus with diabetic nephropathy ([facility])   GERD (gastroesophageal reflux disease)   Hypertension associated with diabetes ([place])   Hypoglycemia due to insulin   CURRENT MEDS:   . amLODipine  5 mg Oral Daily  . calcitRIOL  0.5 mcg Oral Daily  . calcium carbonate  2 tablet Oral TID  . Chlorhexidine Gluconate Cloth  6 each Topical Q0600  . heparin  5,000 Units Subcutaneous Q8H  . hydrALAZINE  25 mg Oral TID  . insulin pump   Subcutaneous TID WC, HS, 0200  . metoprolol tartrate  50 mg Oral BID  . sodium  bicarbonate  650 mg Oral BID    Deitra Mayo Office: [phone] 01/13/2021

## 2021-01-14 ENCOUNTER — Inpatient Hospital Stay (HOSPITAL_COMMUNITY): Payer: No Typology Code available for payment source | Admitting: Certified Registered"

## 2021-01-14 ENCOUNTER — Encounter (HOSPITAL_COMMUNITY)
Admission: EM | Disposition: A | Discharge status: Home / Self Care | Payer: Self-pay | Source: Home / Self Care | Attending: Internal Medicine

## 2021-01-14 ENCOUNTER — Encounter (HOSPITAL_COMMUNITY): Payer: Self-pay | Admitting: Internal Medicine

## 2021-01-14 ENCOUNTER — Inpatient Hospital Stay (HOSPITAL_COMMUNITY): Payer: No Typology Code available for payment source

## 2021-01-14 DIAGNOSIS — M7989 Other specified soft tissue disorders: Secondary | ICD-10-CM

## 2021-01-14 DIAGNOSIS — M79601 Pain in right arm: Secondary | ICD-10-CM

## 2021-01-14 HISTORY — PX: AV FISTULA PLACEMENT: SHX1204

## 2021-01-14 LAB — RENAL FUNCTION PANEL
Albumin: 2.6 g/dL — ABNORMAL LOW (ref 3.5–5.0)
Anion gap: 6 (ref 5–15)
BUN: 19 mg/dL (ref 6–20)
CO2: 28 mmol/L (ref 22–32)
Calcium: 8.1 mg/dL — ABNORMAL LOW (ref 8.9–10.3)
Chloride: 101 mmol/L (ref 98–111)
Creatinine, Ser: 3.56 mg/dL — ABNORMAL HIGH (ref 0.44–1.00)
GFR, Estimated: 17 mL/min — ABNORMAL LOW (ref >60)
Glucose, Bld: 211 mg/dL — ABNORMAL HIGH (ref 70–99)
Phosphorus: 2.9 mg/dL (ref 2.5–4.6)
Potassium: 3.2 mmol/L — ABNORMAL LOW (ref 3.5–5.1)
Sodium: 135 mmol/L (ref 135–145)

## 2021-01-14 LAB — GLUCOSE, CAPILLARY
Glucose-Capillary: 152 mg/dL — ABNORMAL HIGH (ref 70–99)
Glucose-Capillary: 187 mg/dL — ABNORMAL HIGH (ref 70–99)
Glucose-Capillary: 208 mg/dL — ABNORMAL HIGH (ref 70–99)
Glucose-Capillary: 161 mg/dL — ABNORMAL HIGH (ref 70–99)
Glucose-Capillary: 208 mg/dL — ABNORMAL HIGH (ref 70–99)
Glucose-Capillary: 295 mg/dL — ABNORMAL HIGH (ref 70–99)
Glucose-Capillary: 239 mg/dL — ABNORMAL HIGH (ref 70–99)

## 2021-01-14 SURGERY — ARTERIOVENOUS (AV) FISTULA CREATION
Anesthesia: Monitor Anesthesia Care | Site: Arm Upper | Laterality: Left

## 2021-01-14 MED ORDER — HYDROMORPHONE HCL 1 MG/ML IJ SOLN
1.0000 mg | Freq: Once | INTRAMUSCULAR | Status: AC
  Administered 2021-01-14: 1 mg via INTRAVENOUS
  Filled 2021-01-14: qty 1

## 2021-01-14 MED ORDER — CHLORHEXIDINE GLUCONATE 0.12 % MT SOLN
OROMUCOSAL | Status: AC
  Administered 2021-01-14: 15 mL via OROMUCOSAL
  Filled 2021-01-14: qty 15

## 2021-01-14 MED ORDER — LIDOCAINE-EPINEPHRINE 1 %-1:100000 IJ SOLN
INTRAMUSCULAR | Status: DC | PRN
  Administered 2021-01-14: 10 mL

## 2021-01-14 MED ORDER — MORPHINE SULFATE (PF) 2 MG/ML IV SOLN
1.0000 mg | INTRAVENOUS | Status: DC | PRN
  Administered 2021-01-14 – 2021-01-15 (×4): 1 mg via INTRAVENOUS
  Filled 2021-01-14 (×4): qty 1

## 2021-01-14 MED ORDER — SODIUM CHLORIDE 0.9 % IV SOLN
INTRAVENOUS | Status: DC | PRN
  Administered 2021-01-14: 500 mL

## 2021-01-14 MED ORDER — 0.9 % SODIUM CHLORIDE (POUR BTL) OPTIME
TOPICAL | Status: DC | PRN
  Administered 2021-01-14: 1000 mL

## 2021-01-14 MED ORDER — LABETALOL HCL 5 MG/ML IV SOLN
10.0000 mg | INTRAVENOUS | Status: AC | PRN
  Administered 2021-01-14: 10 mg via INTRAVENOUS

## 2021-01-14 MED ORDER — MIDAZOLAM HCL 5 MG/5ML IJ SOLN
INTRAMUSCULAR | Status: DC | PRN
  Administered 2021-01-14: 2 mg via INTRAVENOUS

## 2021-01-14 MED ORDER — FENTANYL CITRATE (PF) 100 MCG/2ML IJ SOLN: 25.0000 ug | INTRAMUSCULAR | Status: DC | PRN

## 2021-01-14 MED ORDER — LABETALOL HCL 5 MG/ML IV SOLN
INTRAVENOUS | Status: AC
  Administered 2021-01-14: 10 mg via INTRAVENOUS
  Filled 2021-01-14: qty 4

## 2021-01-14 MED ORDER — MIDAZOLAM HCL 2 MG/2ML IJ SOLN
INTRAMUSCULAR | Status: AC
  Filled 2021-01-14: qty 2

## 2021-01-14 MED ORDER — PROPOFOL 10 MG/ML IV BOLUS
INTRAVENOUS | Status: AC
  Filled 2021-01-14: qty 40

## 2021-01-14 MED ORDER — CHLORHEXIDINE GLUCONATE 0.12 % MT SOLN: 15.0000 mL | Freq: Once | OROMUCOSAL | Status: AC

## 2021-01-14 MED ORDER — PROPOFOL 500 MG/50ML IV EMUL
INTRAVENOUS | Status: DC | PRN
  Administered 2021-01-14: 80 ug/kg/min via INTRAVENOUS

## 2021-01-14 MED ORDER — TRIMETHOBENZAMIDE HCL 100 MG/ML IM SOLN
200.0000 mg | Freq: Once | INTRAMUSCULAR | Status: AC
  Administered 2021-01-14: 200 mg via INTRAMUSCULAR
  Filled 2021-01-14: qty 2

## 2021-01-14 MED ORDER — FENTANYL CITRATE (PF) 250 MCG/5ML IJ SOLN
INTRAMUSCULAR | Status: AC
  Filled 2021-01-14: qty 5

## 2021-01-14 MED ORDER — ORAL CARE MOUTH RINSE
15.0000 mL | Freq: Once | OROMUCOSAL | Status: AC
Start: 2021-01-14 — End: 2021-01-14

## 2021-01-14 MED ORDER — CEFAZOLIN SODIUM-DEXTROSE 2-4 GM/100ML-% IV SOLN
2.0000 g | INTRAVENOUS | Status: AC
  Administered 2021-01-14: 2 g via INTRAVENOUS
  Filled 2021-01-14: qty 100

## 2021-01-14 MED ORDER — HEPARIN SODIUM (PORCINE) 1000 UNIT/ML IJ SOLN: 1000.0000 [IU] | INTRAMUSCULAR | Status: DC | PRN

## 2021-01-14 MED ORDER — FENTANYL CITRATE (PF) 100 MCG/2ML IJ SOLN
INTRAMUSCULAR | Status: DC | PRN
  Administered 2021-01-14: 25 ug via INTRAVENOUS
  Administered 2021-01-14: 50 ug via INTRAVENOUS

## 2021-01-14 MED ORDER — ONDANSETRON HCL 4 MG/2ML IJ SOLN
INTRAMUSCULAR | Status: AC
  Filled 2021-01-14: qty 4

## 2021-01-14 MED ORDER — PROPOFOL 10 MG/ML IV BOLUS
INTRAVENOUS | Status: DC | PRN
  Administered 2021-01-14 (×2): 20 mg via INTRAVENOUS

## 2021-01-14 MED ORDER — HEPARIN SODIUM (PORCINE) 1000 UNIT/ML IJ SOLN
INTRAMUSCULAR | Status: AC
  Administered 2021-01-14: 3200 [IU] via INTRAVENOUS
  Filled 2021-01-14: qty 4

## 2021-01-14 MED ORDER — HYDROCODONE-ACETAMINOPHEN 5-325 MG PO TABS
1.0000 | ORAL_TABLET | ORAL | Status: DC | PRN
  Administered 2021-01-14 – 2021-01-15 (×2): 1 via ORAL
  Filled 2021-01-14 (×2): qty 1

## 2021-01-14 MED ORDER — SODIUM CHLORIDE 0.9 % IV SOLN: INTRAVENOUS | Status: DC

## 2021-01-14 SURGICAL SUPPLY — 31 items
ARMBAND PINK RESTRICT EXTREMIT (MISCELLANEOUS) ×2 IMPLANT
CANISTER SUCT 3000ML PPV (MISCELLANEOUS) ×2 IMPLANT
CLIP LIGATING EXTRA MED SLVR (CLIP) ×2 IMPLANT
CLIP LIGATING EXTRA SM BLUE (MISCELLANEOUS) ×2 IMPLANT
CLIP VESOCCLUDE MED 6/CT (CLIP) ×2 IMPLANT
CLIP VESOCCLUDE SM WIDE 6/CT (CLIP) ×2 IMPLANT
COVER PROBE W GEL 5X96 (DRAPES) IMPLANT
COVER WAND RF STERILE (DRAPES) ×2 IMPLANT
DECANTER SPIKE VIAL GLASS SM (MISCELLANEOUS) ×1 IMPLANT
DERMABOND ADVANCED (GAUZE/BANDAGES/DRESSINGS) ×1
DERMABOND ADVANCED .7 DNX12 (GAUZE/BANDAGES/DRESSINGS) ×1 IMPLANT
ELECT REM PT RETURN 9FT ADLT (ELECTROSURGICAL) ×2
ELECTRODE REM PT RTRN 9FT ADLT (ELECTROSURGICAL) ×1 IMPLANT
GLOVE BIO SURGEON STRL SZ7.5 (GLOVE) ×2 IMPLANT
GOWN STRL REUS W/ TWL LRG LVL3 (GOWN DISPOSABLE) ×2 IMPLANT
GOWN STRL REUS W/ TWL XL LVL3 (GOWN DISPOSABLE) ×1 IMPLANT
GOWN STRL REUS W/TWL LRG LVL3 (GOWN DISPOSABLE) ×2
GOWN STRL REUS W/TWL XL LVL3 (GOWN DISPOSABLE) ×1
INSERT FOGARTY SM (MISCELLANEOUS) IMPLANT
KIT BASIN OR (CUSTOM PROCEDURE TRAY) ×2 IMPLANT
KIT TURNOVER KIT B (KITS) ×2 IMPLANT
NS IRRIG 1000ML POUR BTL (IV SOLUTION) ×2 IMPLANT
PACK CV ACCESS (CUSTOM PROCEDURE TRAY) ×2 IMPLANT
PAD ARMBOARD 7.5X6 YLW CONV (MISCELLANEOUS) ×4 IMPLANT
SUT MNCRL AB 4-0 PS2 18 (SUTURE) ×2 IMPLANT
SUT PROLENE 6 0 BV (SUTURE) ×2 IMPLANT
SUT VIC AB 3-0 SH 27 (SUTURE) ×1
SUT VIC AB 3-0 SH 27X BRD (SUTURE) ×1 IMPLANT
TOWEL GREEN STERILE (TOWEL DISPOSABLE) ×2 IMPLANT
UNDERPAD 30X36 HEAVY ABSORB (UNDERPADS AND DIAPERS) ×2 IMPLANT
WATER STERILE IRR 1000ML POUR (IV SOLUTION) ×2 IMPLANT

## 2021-01-14 NOTE — Progress Notes (Signed)
Upper extremity venous has been completed.   Preliminary results in CV Proc.   Abram Sander 01/14/2021 10:25 AM

## 2021-01-14 NOTE — Progress Notes (Signed)
Renal Navigator attempted to follow up with patient to confirm her outpatient HD clinic information and seat, but she was not back to her room yet from surgery. Navigator will follow up tomorrow, since she is not discharging today and may still be sleepy from anesthesia this afternoon. Schedule letter left at patient's bedside for her information.  Alphonzo Cruise, [place] Renal Navigator [phone]

## 2021-01-14 NOTE — Transfer of Care (Signed)
Immediate Anesthesia Transfer of Care Note  Patient: Kimberly [name]  Procedure(s) Performed: ARTERIOVENOUS (AV) FISTULA CREATION (Left Arm Upper)  Patient Location: PACU  Anesthesia Type:MAC  Level of Consciousness: awake and patient cooperative  Airway & Oxygen Therapy: Patient Spontanous Breathing and Patient connected to nasal cannula oxygen  Post-op Assessment: Report given to RN, Post -op Vital signs reviewed and stable and Patient moving all extremities  Post vital signs: Reviewed and stable  Last Vitals:  Vitals Value Taken Time  BP 146/69 01/14/21 1417  Temp    Pulse 102 01/14/21 1417  Resp 17 01/14/21 1417  SpO2 100 % 01/14/21 1417  Vitals shown include unvalidated device data.  Last Pain:  Vitals:   01/14/21 1300  TempSrc: Oral  PainSc:       Patients Stated Pain Goal: 3 (60/15/61 5379)  Complications: No complications documented.

## 2021-01-14 NOTE — Progress Notes (Signed)
Patient leaving floor for dialysis at this time.

## 2021-01-14 NOTE — Progress Notes (Signed)
Patient complaining of nausea and has vomited x one after being medicated with Dilaudid for pain. Spoke with Dr. Tonie Griffith, telephone orders received to medicate with Trimethobenzamide IM. Patient medicated, will continue to monitor.

## 2021-01-14 NOTE — Progress Notes (Signed)
Notified Nephrologist on pt.'s BP continue to go up and ordered to increase the goal to 1L and UF time by 10mns. Pt is NAD

## 2021-01-14 NOTE — Progress Notes (Addendum)
[facility] has been accepted [facility] on a MWF schedule with a seat time of 11:15am. If cleared for discharge after permanent access placement today, she can start there tomorrow and needs to arrive at 10:30am. I will need to complete some paperwork with her this afternoon in order for her to be able to start in the clinic tomorrow due to patient load at the clinic tomorrow.  Nephrologist clears patient to start outpatient tomorrow. Navigator discussed with Attending, who states patient will need to remain hospitalized at least overnight due to RUE swelling and to restart insulin pump. In this case, we will plan for HD in the hospital on 5/20 and tentatively plan for outpatient start on Monday, 01/18/21. She needs to arrive to [facility] at 10:15am. I will review this plan with patient.  Alphonzo Cruise, [place] [facility]al Navigator [phone]

## 2021-01-14 NOTE — Progress Notes (Signed)
PROGRESS NOTE        PATIENT DETAILS Name: Kimberly [name] Age: 30 y.o. Sex: female Date of Birth: [birth year] Admit Date: 01/11/2021 Admitting Physician Para Skeans, MD VOH:YWVPX, Mindi Curling, MD  Brief Narrative: Patient is a 30 y.o. female with a history of CKD stage V, DM-1 on insulin pump, HTN-presented with altered mental status/weakness-thought to be due to progression of CKD stage V to ESRD-subsequently admitted to the hospitalist service.  See below for further details.  Significant events: 5/16>> admit for lethargy/AMS-due to progression of CKD stage V to ESRD.  Significant studies: 5/8>> Echo: EF 55-60%, moderate LVH, grade 2 diastolic dysfunction. 5/16>> chest x-ray: No pneumonia 5/19>> right upper extremity Doppler: No DVT.  Antimicrobial therapy: None  Microbiology data: 5/16>> influenza/COVID PCR: Negative  Procedures : 5/17>> tunneled hemodialysis catheter placement by IR  Consults: Nephrology, IR, vascular surgery  DVT Prophylaxis : heparin injection 5,000 Units Start: 01/13/21 0600 SCDs Start: 01/11/21 1611   Subjective: IV infiltrated her right elbow area yesterday-pain and swelling right arm area.  Assessment/Plan: Lethargy/mild metabolic encephalopathy: Resolved with initiation of HD.  Progression of CKD stage V to ESRD: Nephrology following-undergoing HD at the discretion of nephrology.  Vascular surgery planning AV fistula placement today.  HTN: BP fluctuating-continue metoprolol/hydralazine/amlodipine-reassess after HD.    Anemia: Due to CKD-IV iron/ESA per nephrology.  Right arm swelling: Likely due to IV infiltration-supportive care-Doppler negative for DVT.  DM-1: No further episodes of hypoglycemia-currently n.p.o. for AV fistula placement.  Claims she talked to her primary endocrinologist-Dr. Doree Barthel was told to continue with her usual insulin pump regimen.  She will hold the insulin pump before she goes to  the OR today-and can be resumed post operatively when she is awake and alert.  Recent Labs    01/14/21 0419 01/14/21 0653 01/14/21 0752  GLUCAP 152* 187* 208*    Diet: Diet Order            Diet NPO time specified Except for: Sips with Meds  Diet effective midnight                  Code Status: Full code  Family Communication: None at bedside  Disposition Plan: Status is: Inpatient  Remains inpatient appropriate because:Inpatient level of care appropriate due to severity of illness   Dispo: The patient is from: Home              Anticipated d/c is to: Home              Patient currently is not medically stable to d/c.   Difficult to place patient No    Barriers to Discharge: New ESRD-initiating HD-we will need outpatient HD arrangements before discharge.  Antimicrobial agents: Anti-infectives (From admission, onward)   Start     Dose/Rate Route Frequency Ordered Stop   01/14/21 0600  ceFAZolin (ANCEF) IVPB 2g/100 mL premix       Note to Pharmacy: Send with pt to OR   2 g 200 mL/hr over 30 Minutes Intravenous To Short Stay 01/14/21 0535 01/15/21 0630   01/14/21 0000  ceFAZolin (ANCEF) IVPB 2g/100 mL premix  Status:  Discontinued       Note to Pharmacy: Send with pt to OR   2 g 200 mL/hr over 30 Minutes Intravenous On call 01/13/21 0612 01/14/21 0535   01/12/21 1715  ceFAZolin (  ANCEF) IVPB 2g/100 mL premix        2 g 200 mL/hr over 30 Minutes Intravenous  Once 01/12/21 1626 01/12/21 1658   01/12/21 0800  ceFAZolin (ANCEF) IVPB 2g/100 mL premix        2 g 200 mL/hr over 30 Minutes Intravenous To Radiology 01/12/21 0709 01/12/21 0903       Time spent: 25 minutes-Greater than 50% of this time was spent in counseling, explanation of diagnosis, planning of further management, and coordination of care.  MEDICATIONS: Scheduled Meds: . amLODipine  5 mg Oral Daily  . calcitRIOL  0.5 mcg Oral Daily  . calcium carbonate  2 tablet Oral TID  . Chlorhexidine  Gluconate Cloth  6 each Topical Q0600  . heparin  5,000 Units Subcutaneous Q8H  . insulin pump   Subcutaneous TID WC, HS, 0200  . metoprolol tartrate  50 mg Oral BID  . sodium bicarbonate  650 mg Oral BID  . sodium chloride flush  10-40 mL Intracatheter Q12H   Continuous Infusions: .  ceFAZolin (ANCEF) IV    . ferric gluconate (FERRLECIT/NULECIT) IV Stopped (01/13/21 1015)   PRN Meds:.   PHYSICAL EXAM: Vital signs: Vitals:   01/14/21 0330 01/14/21 0426 01/14/21 0444 01/14/21 1153  BP: (!) 171/101 (!) 164/89  (!) 171/93  Pulse: 86 87 87 88  Resp: 18 17  20   Temp: 98.6 F (37 C) 100 F (37.8 C)  98.7 F (37.1 C)  TempSrc: Oral Oral  Oral  SpO2: 99% 100%  97%  Weight:      Height:       Filed Weights   01/12/21 1940 01/12/21 2147 01/14/21 0020  Weight: 74.9 kg 75.6 kg 74.3 kg   Body mass index is 27.26 kg/m.   Gen Exam:Alert awake-not in any distress HEENT:atraumatic, normocephalic Chest: B/L clear to auscultation anteriorly CVS:S1S2 regular Abdomen:soft non tender, non distended Extremities:no edema.  Right arm slightly swollen and tender. Neurology: Non focal Skin: no rash  I have personally reviewed following labs and imaging studies  LABORATORY DATA: CBC: Recent Labs  Lab 01/11/21 1153 01/12/21 1948 01/13/21 0109  WBC 8.9 10.1 11.4*  NEUTROABS 6.1  --   --   HGB 10.3* 9.7* 9.5*  HCT 30.8* 29.1* 28.3*  MCV 78.6* 76.8* 77.3*  PLT 232 233 903    Basic Metabolic Panel: Recent Labs  Lab 01/11/21 1153 01/11/21 2303 01/12/21 0259 01/13/21 0109 01/14/21 0559  NA 140  --  136 139 135  K 3.2*  --  4.4 3.5 3.2*  CL 108  --  105 106 101  CO2 19*  --  17* 23 28  GLUCOSE 44*  --  349* 150* 211*  BUN 61*  --  70* 39* 19  CREATININE 6.97*  --  6.86* 4.89* 3.56*  CALCIUM 6.1*  --  6.3* 6.9* 8.1*  MG 2.0  --   --   --   --   PHOS  --  7.3* 6.7* 3.4 2.9    GFR: Estimated Creatinine Clearance: 22.9 mL/min (A) (by C-G formula based on SCr of 3.56 mg/dL  (H)).  Liver Function Tests: Recent Labs  Lab 01/11/21 1153 01/12/21 0259 01/13/21 0109 01/14/21 0559  AST 10*  --   --   --   ALT 10  --   --   --   ALKPHOS 91  --   --   --   BILITOT 0.4  --   --   --  PROT 6.9  --   --   --   ALBUMIN 3.1* 2.9* 2.6* 2.6*   No results for input(s): LIPASE, AMYLASE in the last 168 hours. No results for input(s): AMMONIA in the last 168 hours.  Coagulation Profile: Recent Labs  Lab 01/11/21 1153  INR 1.0    Cardiac Enzymes: No results for input(s): CKTOTAL, CKMB, CKMBINDEX, TROPONINI in the last 168 hours.  BNP (last 3 results) No results for input(s): PROBNP in the last 8760 hours.  Lipid Profile: No results for input(s): CHOL, HDL, LDLCALC, TRIG, CHOLHDL, LDLDIRECT in the last 72 hours.  Thyroid Function Tests: No results for input(s): TSH, T4TOTAL, FREET4, T3FREE, THYROIDAB in the last 72 hours.  Anemia Panel: Recent Labs    01/11/21 2303  FERRITIN 71  TIBC 354  IRON 55    Urine analysis:    Component Value Date/Time   COLORURINE YELLOW 01/13/2021 0357   APPEARANCEUR CLEAR 01/13/2021 0357   LABSPEC 1.011 01/13/2021 0357   PHURINE 6.0 01/13/2021 0357   GLUCOSEU 50 (A) 01/13/2021 0357   HGBUR MODERATE (A) 01/13/2021 0357   BILIRUBINUR NEGATIVE 01/13/2021 [place] 01/13/2021 0357   PROTEINUR 100 (A) 01/13/2021 0357   UROBILINOGEN 0.2 03/21/2015 2010   NITRITE NEGATIVE 01/13/2021 0357   LEUKOCYTESUR NEGATIVE 01/13/2021 0357    Sepsis Labs: Lactic Acid, Venous    Component Value Date/Time   LATICACIDVEN 1.93 (H) 05/21/2018 1143    MICROBIOLOGY: Recent Results (from the past 240 hour(s))  Resp Panel by RT-PCR (Flu A&B, Covid) Nasopharyngeal Swab     Status: None   Collection Time: 01/11/21  3:05 PM   Specimen: Nasopharyngeal Swab; Nasopharyngeal(NP) swabs in vial transport medium  Result Value Ref Range Status   SARS Coronavirus 2 by RT PCR NEGATIVE NEGATIVE Final    Comment:  (NOTE) SARS-CoV-2 target nucleic acids are NOT DETECTED.  The SARS-CoV-2 RNA is generally detectable in upper respiratory specimens during the acute phase of infection. The lowest concentration of SARS-CoV-2 viral copies this assay can detect is 138 copies/mL. A negative result does not preclude SARS-Cov-2 infection and should not be used as the sole basis for treatment or other patient management decisions. A negative result may occur with  improper specimen collection/handling, submission of specimen other than nasopharyngeal swab, presence of viral mutation(s) within the areas targeted by this assay, and inadequate number of viral copies(<138 copies/mL). A negative result must be combined with clinical observations, patient history, and epidemiological information. The expected result is Negative.  Fact Sheet for Patients:  EntrepreneurPulse.com.au  Fact Sheet for Healthcare Providers:  IncredibleEmployment.be  This test is no t yet approved or cleared by the [place] FDA and  has been authorized for detection and/or diagnosis of SARS-CoV-2 by FDA under an Emergency Use Authorization (EUA). This EUA will remain  in effect (meaning this test can be used) for the duration of the COVID-19 declaration under Section 564(b)(1) of the Act, 21 U.S.C.section 360bbb-3(b)(1), unless the authorization is terminated  or revoked sooner.       Influenza A by PCR NEGATIVE NEGATIVE Final   Influenza B by PCR NEGATIVE NEGATIVE Final    Comment: (NOTE) The Xpert Xpress SARS-CoV-2/FLU/RSV plus assay is intended as an aid in the diagnosis of influenza from Nasopharyngeal swab specimens and should not be used as a sole basis for treatment. Nasal washings and aspirates are unacceptable for Xpert Xpress SARS-CoV-2/FLU/RSV testing.  Fact Sheet for Patients: EntrepreneurPulse.com.au  Fact Sheet for Healthcare  Providers: IncredibleEmployment.be  This test is not yet approved or cleared by the [place] and has been authorized for detection and/or diagnosis of SARS-CoV-2 by FDA under an Emergency Use Authorization (EUA). This EUA will remain in effect (meaning this test can be used) for the duration of the COVID-19 declaration under Section 564(b)(1) of the Act, 21 U.S.C. section 360bbb-3(b)(1), unless the authorization is terminated or revoked.  Performed at [facility], [place] [address], [place], [place] [address]   MRSA PCR Screening     Status: None   Collection Time: 01/11/21 10:28 PM   Specimen: Nasal Mucosa; Nasopharyngeal  Result Value Ref Range Status   MRSA by PCR NEGATIVE NEGATIVE Final    Comment:        The GeneXpert MRSA Assay (FDA approved for NASAL specimens only), is one component of a comprehensive MRSA colonization surveillance program. It is not intended to diagnose MRSA infection nor to guide or monitor treatment for MRSA infections. Performed at [facility], [place] [address], [place], [place] [address]     RADIOLOGY STUDIES/RESULTS: IR Fluoro Guide CV Line Right  Result Date: 01/13/2021 INDICATION: Development of ESRD, in need of intravenous access for the initiation of hemodialysis. EXAM: TUNNELED CENTRAL VENOUS HEMODIALYSIS CATHETER PLACEMENT WITH ULTRASOUND AND FLUOROSCOPIC GUIDANCE MEDICATIONS: The patient is admitted to the hospital and receiving antibiotics. The antibiotic was given in an appropriate time interval prior to skin puncture. ANESTHESIA/SEDATION: Moderate (conscious) sedation was employed during this procedure. A total of Versed 1 mg and Fentanyl 50 mcg was administered intravenously. Moderate Sedation Time: 14 minutes. The patient's level of consciousness and vital signs were monitored continuously by radiology nursing throughout the procedure under my direct supervision. FLUOROSCOPY TIME:  18 seconds (3 mGy).  COMPLICATIONS: None immediate. PROCEDURE: Informed written consent was obtained from the patient after a discussion of the risks, benefits, and alternatives to treatment. Questions regarding the procedure were encouraged and answered. The right neck and chest were prepped with chlorhexidine in a sterile fashion, and a sterile drape was applied covering the operative field. Maximum barrier sterile technique with sterile gowns and gloves were used for the procedure. A timeout was performed prior to the initiation of the procedure. After creating a small venotomy incision, a micropuncture kit was utilized to access the internal jugular vein. Real-time ultrasound guidance was utilized for vascular access including the acquisition of a permanent ultrasound image documenting patency of the accessed vessel. The microwire was utilized to measure appropriate catheter length. A stiff Glidewire was advanced to the level of the IVC and the micropuncture sheath was exchanged for a peel-away sheath. A Palindrome tunneled hemodialysis catheter measuring 19 cm from tip to cuff was tunneled in a retrograde fashion from the anterior chest wall to the venotomy incision. The catheter was then placed through the peel-away sheath with tips ultimately positioned within the superior aspect of the right atrium. Final catheter positioning was confirmed and documented with a spot radiographic image. The catheter aspirates and flushes normally. The catheter was flushed with appropriate volume heparin dwells. The catheter exit site was secured with a 0-Prolene retention suture. The venotomy incision was closed with Dermabond and Steri-strips. Dressings were applied. The patient tolerated the procedure well without immediate post procedural complication. IMPRESSION: Successful placement of 19 cm tip to cuff tunneled hemodialysis catheter via the right internal jugular vein with tips terminating within the superior aspect of the right atrium. The  catheter is ready for immediate use. Electronically  Signed   By: Sandi Mariscal M.D.   On: 01/13/2021 08:24   IR US Guide Vasc Access Right  Result Date: 01/13/2021 INDICATION: Development of ESRD, in need of intravenous access for the initiation of hemodialysis. EXAM: TUNNELED CENTRAL VENOUS HEMODIALYSIS CATHETER PLACEMENT WITH ULTRASOUND AND FLUOROSCOPIC GUIDANCE MEDICATIONS: The patient is admitted to the hospital and receiving antibiotics. The antibiotic was given in an appropriate time interval prior to skin puncture. ANESTHESIA/SEDATION: Moderate (conscious) sedation was employed during this procedure. A total of Versed 1 mg and Fentanyl 50 mcg was administered intravenously. Moderate Sedation Time: 14 minutes. The patient's level of consciousness and vital signs were monitored continuously by radiology nursing throughout the procedure under my direct supervision. FLUOROSCOPY TIME:  18 seconds (3 mGy). COMPLICATIONS: None immediate. PROCEDURE: Informed written consent was obtained from the patient after a discussion of the risks, benefits, and alternatives to treatment. Questions regarding the procedure were encouraged and answered. The right neck and chest were prepped with chlorhexidine in a sterile fashion, and a sterile drape was applied covering the operative field. Maximum barrier sterile technique with sterile gowns and gloves were used for the procedure. A timeout was performed prior to the initiation of the procedure. After creating a small venotomy incision, a micropuncture kit was utilized to access the internal jugular vein. Real-time ultrasound guidance was utilized for vascular access including the acquisition of a permanent ultrasound image documenting patency of the accessed vessel. The microwire was utilized to measure appropriate catheter length. A stiff Glidewire was advanced to the level of the IVC and the micropuncture sheath was exchanged for a peel-away sheath. A Palindrome tunneled  hemodialysis catheter measuring 19 cm from tip to cuff was tunneled in a retrograde fashion from the anterior chest wall to the venotomy incision. The catheter was then placed through the peel-away sheath with tips ultimately positioned within the superior aspect of the right atrium. Final catheter positioning was confirmed and documented with a spot radiographic image. The catheter aspirates and flushes normally. The catheter was flushed with appropriate volume heparin dwells. The catheter exit site was secured with a 0-Prolene retention suture. The venotomy incision was closed with Dermabond and Steri-strips. Dressings were applied. The patient tolerated the procedure well without immediate post procedural complication. IMPRESSION: Successful placement of 19 cm tip to cuff tunneled hemodialysis catheter via the right internal jugular vein with tips terminating within the superior aspect of the right atrium. The catheter is ready for immediate use. Electronically Signed   By: Sandi Mariscal M.D.   On: 01/13/2021 08:24   VAS [place] UPPER EXT VEIN MAPPING (PRE-OP AVF)  Result Date: 01/12/2021 UPPER EXTREMITY VEIN MAPPING Patient Name:  TALIYAH WATROUS  Date of Exam:   01/12/2021 Medical Rec #: [id number]          Accession #:    [id number] Date of Birth: [birth year]          Patient Gender: F Patient Age:   30Y Exam Location:  [facility] Procedure:      VAS [place] UPPER EXT VEIN MAPPING (PRE-OP AVF) Referring Phys: [id number] SAMANTHA J RHYNE --------------------------------------------------------------------------------  Indications: Pre-access. History: CKD.  Limitations: Vein size Comparison Study: No prior study Performing Technologist: Maudry Mayhew MHA, RDMS, RVT, RDCS  Examination Guidelines: A complete evaluation includes B-mode imaging, spectral Doppler, color Doppler, and power Doppler as needed of all accessible portions of each vessel. Bilateral testing is considered an integral part of a complete  examination. Limited examinations for reoccurring indications  may be performed as noted. +-----------------+-------------+----------+--------------+ Right Cephalic   Diameter (cm)Depth (cm)   Findings    +-----------------+-------------+----------+--------------+ Shoulder             0.05                              +-----------------+-------------+----------+--------------+ Prox upper arm       0.07                              +-----------------+-------------+----------+--------------+ Mid upper arm                           not visualized +-----------------+-------------+----------+--------------+ Dist upper arm                          not visualized +-----------------+-------------+----------+--------------+ Antecubital fossa                       not visualized +-----------------+-------------+----------+--------------+ Prox forearm                            not visualized +-----------------+-------------+----------+--------------+ Mid forearm                             not visualized +-----------------+-------------+----------+--------------+ Dist forearm                            not visualized +-----------------+-------------+----------+--------------+ Wrist                                   not visualized +-----------------+-------------+----------+--------------+ +-----------------+-------------+----------+--------------+ Right Basilic    Diameter (cm)Depth (cm)   Findings    +-----------------+-------------+----------+--------------+ Mid upper arm        0.18                              +-----------------+-------------+----------+--------------+ Dist upper arm       0.23                              +-----------------+-------------+----------+--------------+ Antecubital fossa                       not visualized +-----------------+-------------+----------+--------------+ Prox forearm                            not visualized  +-----------------+-------------+----------+--------------+ Mid forearm                             not visualized +-----------------+-------------+----------+--------------+ Distal forearm                          not visualized +-----------------+-------------+----------+--------------+ Wrist                                   not visualized +-----------------+-------------+----------+--------------+ +-----------------+-------------+----------+--------------+ Left Cephalic    Diameter (cm)Depth (cm)   Findings    +-----------------+-------------+----------+--------------+  Shoulder             0.06                              +-----------------+-------------+----------+--------------+ Prox upper arm       0.07                              +-----------------+-------------+----------+--------------+ Mid upper arm        0.08                              +-----------------+-------------+----------+--------------+ Dist upper arm                          not visualized +-----------------+-------------+----------+--------------+ Antecubital fossa                       not visualized +-----------------+-------------+----------+--------------+ Prox forearm                            not visualized +-----------------+-------------+----------+--------------+ Mid forearm                             not visualized +-----------------+-------------+----------+--------------+ Dist forearm                            not visualized +-----------------+-------------+----------+--------------+ Wrist                                   not visualized +-----------------+-------------+----------+--------------+ +-----------------+-------------+----------+--------------+ Left Basilic     Diameter (cm)Depth (cm)   Findings    +-----------------+-------------+----------+--------------+ Mid upper arm        0.21                               +-----------------+-------------+----------+--------------+ Dist upper arm       0.19                              +-----------------+-------------+----------+--------------+ Antecubital fossa                       not visualized +-----------------+-------------+----------+--------------+ Prox forearm                            not visualized +-----------------+-------------+----------+--------------+ Mid forearm                             not visualized +-----------------+-------------+----------+--------------+ Distal forearm                          not visualized +-----------------+-------------+----------+--------------+ Wrist                                   not visualized +-----------------+-------------+----------+--------------+ *See table(s) above for measurements and observations.  Diagnosing physician: Deitra Mayo MD Electronically signed by Deitra Mayo MD on 01/12/2021 at 3:45:12  PM.    Final    VAS [place] UPPER EXTREMITY VENOUS DUPLEX  Result Date: 01/14/2021 UPPER VENOUS STUDY  Patient Name:  DINARA LUPU  Date of Exam:   01/14/2021 Medical Rec #: [id number]          Accession #:    [id number] Date of Birth: 1990-12-14          Patient Gender: F Patient Age:   68Y Exam Location:  [facility] Procedure:      VAS [place] UPPER EXTREMITY VENOUS DUPLEX Referring Phys: [id number] [place] --------------------------------------------------------------------------------  Indications: Swelling, and Pain Comparison Study: no prior Performing Technologist: Abram Sander RVS  Examination Guidelines: A complete evaluation includes B-mode imaging, spectral Doppler, color Doppler, and power Doppler as needed of all accessible portions of each vessel. Bilateral testing is considered an integral part of a complete examination. Limited examinations for reoccurring indications may be performed as noted.  Right Findings:  +----------+------------+---------+-----------+----------+--------------+ RIGHT     CompressiblePhasicitySpontaneousProperties   Summary     +----------+------------+---------+-----------+----------+--------------+ IJV           Full       Yes       Yes                             +----------+------------+---------+-----------+----------+--------------+ Subclavian    Full       Yes       Yes                             +----------+------------+---------+-----------+----------+--------------+ Axillary      Full       Yes       Yes                             +----------+------------+---------+-----------+----------+--------------+ Brachial      Full       Yes       Yes                             +----------+------------+---------+-----------+----------+--------------+ Radial        Full                                                 +----------+------------+---------+-----------+----------+--------------+ Ulnar         Full                                                 +----------+------------+---------+-----------+----------+--------------+ Cephalic                                            Not visualized +----------+------------+---------+-----------+----------+--------------+ Basilic       Full                                                 +----------+------------+---------+-----------+----------+--------------+  Left Findings: +----------+------------+---------+-----------+----------+-------+ LEFT      CompressiblePhasicitySpontaneousPropertiesSummary +----------+------------+---------+-----------+----------+-------+ Subclavian               Yes       Yes                      +----------+------------+---------+-----------+----------+-------+  Summary:  Right: No evidence of deep vein thrombosis in the upper extremity. No evidence of superficial vein thrombosis in the upper extremity. No evidence of thrombosis in the subclavian.   Left: No evidence of thrombosis in the subclavian.  *See table(s) above for measurements and observations.    Preliminary      LOS: 3 days   Oren Binet, MD  [facility]    To contact the attending provider between 7A-7P or the covering provider during after hours 7P-7A, please log into the web site [email or web address] and access using universal [place] password for that web site. If you do not have the password, please call the hospital operator.  01/14/2021, 12:27 PM

## 2021-01-14 NOTE — Progress Notes (Signed)
Dr. Deatra Canter notified of pts BP 227/91. Ordered received for labetolol.

## 2021-01-14 NOTE — Progress Notes (Signed)
Upon initial rounding, patient complaining of swelling to right upper arm/elbow area that she noticed earlier that day. Patient denies pain to right arm at this time. Skin is warm/dry, right radial pulse is 2+. Increased swelling noted when flushing IV in that area. IV team consult placed and dialysis notified of possible delay in transfer to dialysis due to patient needing new IV. New IV placed in right upper arm by IV team at this time, will continue to monitor.

## 2021-01-14 NOTE — Interval H&P Note (Signed)
History and Physical Interval Note:  01/14/2021 12:30 PM  Kimberly [name]  has presented today for surgery, with the diagnosis of End stage renal disease.  The various methods of treatment have been discussed with the patient and family. After consideration of risks, benefits and other options for treatment, the patient has consented to  Procedure(s): ARTERIOVENOUS (AV) FISTULA CREATION VS GRAFT (Left) as a surgical intervention.  The patient's history has been reviewed, patient examined, no change in status, stable for surgery.  I have reviewed the patient's chart and labs.  Questions were answered to the patient's satisfaction.     Servando Snare

## 2021-01-14 NOTE — Anesthesia Postprocedure Evaluation (Signed)
Anesthesia Post Note  Patient: Kimberly [name]  Procedure(s) Performed: ARTERIOVENOUS (AV) FISTULA CREATION (Left Arm Upper)     Patient location during evaluation: PACU Anesthesia Type: MAC Level of consciousness: awake and alert Pain management: pain level controlled Vital Signs Assessment: post-procedure vital signs reviewed and stable Respiratory status: spontaneous breathing, nonlabored ventilation, respiratory function stable and patient connected to nasal cannula oxygen Cardiovascular status: stable and blood pressure returned to baseline Postop Assessment: no apparent nausea or vomiting Anesthetic complications: no   No complications documented.  Last Vitals:  Vitals:   01/14/21 1305 01/14/21 1417  BP: (!) 196/84   Pulse:    Resp: 18   Temp:  37 C  SpO2: 99%     Last Pain:  Vitals:   01/14/21 1417  TempSrc:   PainSc: 0-No pain                 Tiajuana Amass

## 2021-01-14 NOTE — Progress Notes (Signed)
Dr. Donzetta Matters notified of pt temp at 100.5 at this time. Previous recorded temp 98.7 at 1153 this morning. OK to continue with surgery per Dr. Donzetta Matters.

## 2021-01-14 NOTE — Progress Notes (Signed)
Nephrology Follow-Up Consult note   Assessment/Recommendations: Kimberly [name] is a/an 30 y.o. female with a past medical history significant for CKD 5, DM1, HTN  who present w/ new ESRD  CKD 5 now considered ESRD: Progressive renal failure likely diabetic kidney disease now progressed to ESRD needing to start dialysis -IR placed TDC on 5/17, appreciate help -3 of 3 initiation dialysis sessions tomorrow.  Then maintain MWF schedule -Undergoing AVG today; appreciate help from [facility] -Involve renal coordinator and education team  Volume Status: Appears acceptable.  Hold home Lasix. Volume mgmt with hd  Hypertension: Hypotension with dialysis.  Stopped hydralazine.  Continue amlodipine for now  Anemia: Associated with renal failure.  Ordered IV iron.  Consider ESA  Hypocalcemia/secondary hyperparathyroidism: Likely related to secondary hyperparathyroidism with metabolic bone disease secondary to CKD.  Increaseed calcitriol to 0.5 MCG daily.    High calcium bath.  PTH 157.  Phos normal after hd  Type 1 diabetes: Management per primary team.  Uncontrolled with hyperglycemia intermittently    Recommendations conveyed to primary service.    [facility] 01/14/2021 11:08 AM  ___________________________________________________________  CC: ESRD  Interval History/Subjective: Patient feels okay today.  Tolerated dialysis.  Infiltrated IV on the right arm with bruising which is causing some pain.  Planning for AVF/AVG today   Medications:  Current Facility-Administered Medications  Medication Dose Route Frequency Provider Last Rate Last Admin  . amLODipine (NORVASC) tablet 5 mg  5 mg Oral Daily Para Skeans, MD   5 mg at 01/14/21 0917  . calcitRIOL (ROCALTROL) capsule 0.5 mcg  0.5 mcg Oral Daily Reesa Chew, MD   0.5 mcg at 01/14/21 0917  . calcium carbonate (TUMS - dosed in mg elemental calcium) chewable tablet 400 mg of elemental calcium  2  tablet Oral TID Para Skeans, MD   400 mg of elemental calcium at 01/14/21 0917  . ceFAZolin (ANCEF) IVPB 2g/100 mL premix  2 g Intravenous To SSTC Ghimire, Henreitta Leber, MD      . Chlorhexidine Gluconate Cloth 2 % PADS 6 each  6 each Topical Q0600 Reesa Chew, MD   6 each at 01/14/21 0530  . ferric gluconate (FERRLECIT) 250 mg in sodium chloride 0.9 % 100 mL IVPB  250 mg Intravenous Daily Reesa Chew, MD   Stopped at 01/13/21 1015  . heparin injection 5,000 Units  5,000 Units Subcutaneous Q8H Monia Sabal, PA-C   5,000 Units at 01/13/21 1405  . heparin sodium (porcine) injection 1,000 Units  1,000 Units Intravenous Q dialysis Claudia Desanctis, MD   3,200 Units at 01/14/21 0248  . insulin pump   Subcutaneous TID WC, HS, 0200 Mansy, Arvella Merles, MD   Given at 01/13/21 2326  . metoprolol tartrate (LOPRESSOR) tablet 50 mg  50 mg Oral BID Para Skeans, MD   50 mg at 01/14/21 0917  . morphine 2 MG/ML injection 1 mg  1 mg Intravenous Q4H PRN Jonetta Osgood, MD   1 mg at 01/14/21 1006  . sodium bicarbonate tablet 650 mg  650 mg Oral BID Para Skeans, MD   650 mg at 01/14/21 0917  . sodium chloride flush (NS) 0.9 % injection 10-40 mL  10-40 mL Intracatheter Q12H Ghimire, Henreitta Leber, MD   10 mL at 01/14/21 0918  . sodium chloride flush (NS) 0.9 % injection 10-40 mL  10-40 mL Intracatheter PRN Ghimire, Henreitta Leber, MD  Review of Systems: 10 systems reviewed and negative except per interval history/subjective  Physical Exam: Vitals:   01/14/21 0426 01/14/21 0444  BP: (!) 164/89   Pulse: 87 87  Resp: 17   Temp: 100 F (37.8 C)   SpO2: 100%    No intake/output data recorded.  Intake/Output Summary (Last 24 hours) at 01/14/2021 1108 Last data filed at 01/14/2021 0748 Gross per 24 hour  Intake 0 ml  Output 1000 ml  Net -1000 ml   Constitutional: well-appearing, no acute distress ENMT: ears and nose without scars or lesions, MMM CV: normal rate, edema present in right upper  extremity Respiratory: bilateral chest rise, normal work of breathing Gastrointestinal: soft, non-tender, no palpable masses or hernias Skin: Bruises along the right arm with some surrounding swelling associated with IV infiltration, no visible lesions or rashes Psych: alert, judgement/insight appropriate, appropriate mood and affect   Test Results I personally reviewed new and old clinical labs and radiology tests Lab Results  Component Value Date   NA 135 01/14/2021   K 3.2 (L) 01/14/2021   CL 101 01/14/2021   CO2 28 01/14/2021   BUN 19 01/14/2021   CREATININE 3.56 (H) 01/14/2021   CALCIUM 8.1 (L) 01/14/2021   ALBUMIN 2.6 (L) 01/14/2021   PHOS 2.9 01/14/2021

## 2021-01-14 NOTE — Anesthesia Procedure Notes (Signed)
Procedure Name: MAC Date/Time: 01/14/2021 1:20 PM Performed by: Moshe Salisbury, CRNA Pre-anesthesia Checklist: Patient identified, Emergency Drugs available, Suction available and Patient being monitored Patient Re-evaluated:Patient Re-evaluated prior to induction Oxygen Delivery Method: Nasal cannula Placement Confirmation: positive ETCO2 Dental Injury: Teeth and Oropharynx as per pre-operative assessment

## 2021-01-14 NOTE — Anesthesia Preprocedure Evaluation (Signed)
Anesthesia Evaluation  Patient identified by MRN, date of birth, ID band Patient awake    Reviewed: Allergy & Precautions, NPO status , Patient's Chart, lab work & pertinent test results  Airway Mallampati: III  TM Distance: >3 FB Neck ROM: Full    Dental  (+) Dental Advisory Given   Pulmonary neg pulmonary ROS,    breath sounds clear to auscultation       Cardiovascular hypertension, Pt. on medications and Pt. on home beta blockers  Rhythm:Regular Rate:Normal     Neuro/Psych negative neurological ROS     GI/Hepatic Neg liver ROS, GERD  ,  Endo/Other  diabetes, Type 1, Insulin Dependent  Renal/GU Dialysis and ESRFRenal disease     Musculoskeletal   Abdominal   Peds  Hematology  (+) Sickle cell trait and anemia ,   Anesthesia Other Findings   Reproductive/Obstetrics                             Anesthesia Physical Anesthesia Plan  ASA: III  Anesthesia Plan: MAC   Post-op Pain Management:    Induction:   PONV Risk Score and Plan: 2 and Propofol infusion, Ondansetron and Treatment may vary due to age or medical condition  Airway Management Planned: Natural Airway and Simple Face Mask  Additional Equipment:   Intra-op Plan:   Post-operative Plan:   Informed Consent: I have reviewed the patients History and Physical, chart, labs and discussed the procedure including the risks, benefits and alternatives for the proposed anesthesia with the patient or authorized representative who has indicated his/her understanding and acceptance.       Plan Discussed with: CRNA  Anesthesia Plan Comments:         Anesthesia Quick Evaluation

## 2021-01-14 NOTE — Op Note (Signed)
    Patient name: [name] MRN: [id number] DOB: 1991-01-31 Sex: female  01/14/2021 Pre-operative Diagnosis: esrd Post-operative diagnosis:  Same Surgeon:  Erlene Quan C. Donzetta Matters, MD Assistant: Risa Grill, PA Procedure Performed:  Left arm brachial artery to basilic vein AV fistula creation  Indications: 30 year old female with new onset end-stage renal disease.  She is dialyzing via catheter.  She is indicated for permanent access.  She is right-hand dominant.  Assistant was necessary to expedite the case.  Findings: By ultrasound there was approximately 4 mm basilic vein above the antecubitum.  Brachial artery was healthy.  At completion there was a palpable radial artery pulse strong thrill in the vein.   Procedure:  The patient was identified in the holding area and taken to the operating where she was placed on operative table MAC anesthesia was induced.  She was sterilely prepped draped her left upper extremity usual fashion, antibiotics were minister timeout was called.  Ultrasound was used to identify a suitable basilic vein above the antecubitum.  There is noticed as 1% lidocaine with epinephrine.  A transverse incision was made between the palpable brachial artery pulse and the identified vein.  We dissected down to the vein.  We divided branches during clips and ties.  Marked it for orientation.  Dissected through the deep fascia identified the brachial artery placed Vesseloops around this.  The vein was then doubly clipped distally and transected.  We spatulated serially dilated to 4 mm flushed with heparinized saline and clamped.  The artery was clamped distally proximally opened longitudinally flushed with heparinized saline distally.  The vein was spatulated sewn end-to-side with 6-0 Prolene suture.  Prior to completion without flushing all directions.  Upon completion there was very strong thrill in the vein as well as a palpable radial artery pulse.  These were both confirmed with  Doppler.  Satisfied with this we irrigated the wound.  We obtain hemostasis.  We closed in layers of Vicryl and Monocryl.  Dermabond was placed to level the skin.  She was awakened from anesthesia having tolerated procedure without any complication.  All counts were correct at completion   EBL: 20cc   Erikka Follmer C. Donzetta Matters, MD [facility] Office: [phone] Pager: [phone]

## 2021-01-14 NOTE — Progress Notes (Signed)
Patient back from dialysis at this time. Right arm appears to have increased swelling and patient complaining of increased pain to right arm (pain now 8/10 in intensity). 3+ right radial pulse noted and skin is warm and dry to the right extremity, including fingers. Spoke with Dr. Tonie Griffith, MD notified of situation and background, including recent hemodialysis insertion on 5/17 and infiltrated IV in right arm on evening of 5/18. Telephone orders received to medicate with '1mg'$  IV Dilaudid x 1 dose and obtain vascular ultrasound of right arm. Will medicate as ordered and continue to monitor.

## 2021-01-15 ENCOUNTER — Inpatient Hospital Stay (HOSPITAL_COMMUNITY): Payer: No Typology Code available for payment source

## 2021-01-15 ENCOUNTER — Encounter (HOSPITAL_COMMUNITY): Payer: Self-pay | Admitting: Vascular Surgery

## 2021-01-15 LAB — CBC WITH DIFFERENTIAL/PLATELET
Abs Immature Granulocytes: 0.08 10*3/uL — ABNORMAL HIGH (ref 0.00–0.07)
Basophils Absolute: 0 10*3/uL (ref 0.0–0.1)
Basophils Relative: 0 %
Eosinophils Absolute: 0.1 10*3/uL (ref 0.0–0.5)
Eosinophils Relative: 1 %
HCT: 26.6 % — ABNORMAL LOW (ref 36.0–46.0)
Hemoglobin: 8.8 g/dL — ABNORMAL LOW (ref 12.0–15.0)
Immature Granulocytes: 1 %
Lymphocytes Relative: 8 %
Lymphs Abs: 1.1 10*3/uL (ref 0.7–4.0)
MCH: 26 pg (ref 26.0–34.0)
MCHC: 33.1 g/dL (ref 30.0–36.0)
MCV: 78.7 fL — ABNORMAL LOW (ref 80.0–100.0)
Monocytes Absolute: 1.1 10*3/uL — ABNORMAL HIGH (ref 0.1–1.0)
Monocytes Relative: 8 %
Neutro Abs: 12.1 10*3/uL — ABNORMAL HIGH (ref 1.7–7.7)
Neutrophils Relative %: 82 %
Platelets: 154 10*3/uL (ref 150–400)
RBC: 3.38 MIL/uL — ABNORMAL LOW (ref 3.87–5.11)
RDW: 15.1 % (ref 11.5–15.5)
WBC: 14.6 10*3/uL — ABNORMAL HIGH (ref 4.0–10.5)
nRBC: 0 % (ref 0.0–0.2)

## 2021-01-15 LAB — GLUCOSE, CAPILLARY
Glucose-Capillary: 125 mg/dL — ABNORMAL HIGH (ref 70–99)
Glucose-Capillary: 129 mg/dL — ABNORMAL HIGH (ref 70–99)
Glucose-Capillary: 123 mg/dL — ABNORMAL HIGH (ref 70–99)
Glucose-Capillary: 169 mg/dL — ABNORMAL HIGH (ref 70–99)
Glucose-Capillary: 91 mg/dL (ref 70–99)
Glucose-Capillary: 125 mg/dL — ABNORMAL HIGH (ref 70–99)
Glucose-Capillary: 123 mg/dL — ABNORMAL HIGH (ref 70–99)

## 2021-01-15 LAB — RENAL FUNCTION PANEL
Albumin: 2.5 g/dL — ABNORMAL LOW (ref 3.5–5.0)
Anion gap: 9 (ref 5–15)
BUN: 26 mg/dL — ABNORMAL HIGH (ref 6–20)
CO2: 29 mmol/L (ref 22–32)
Calcium: 7.7 mg/dL — ABNORMAL LOW (ref 8.9–10.3)
Chloride: 101 mmol/L (ref 98–111)
Creatinine, Ser: 4.77 mg/dL — ABNORMAL HIGH (ref 0.44–1.00)
GFR, Estimated: 12 mL/min — ABNORMAL LOW (ref >60)
Glucose, Bld: 98 mg/dL (ref 70–99)
Phosphorus: 4.2 mg/dL (ref 2.5–4.6)
Potassium: 3.8 mmol/L (ref 3.5–5.1)
Sodium: 139 mmol/L (ref 135–145)

## 2021-01-15 MED ORDER — HYDRALAZINE HCL 25 MG PO TABS
25.0000 mg | ORAL_TABLET | Freq: Three times a day (TID) | ORAL | Status: DC
  Administered 2021-01-15 (×2): 25 mg via ORAL
  Filled 2021-01-15 (×2): qty 1

## 2021-01-15 MED ORDER — HEPARIN SODIUM (PORCINE) 1000 UNIT/ML IJ SOLN
INTRAMUSCULAR | Status: AC
  Administered 2021-01-15: 3200 [IU] via INTRAVENOUS
  Filled 2021-01-15: qty 4

## 2021-01-15 MED ORDER — AMLODIPINE BESYLATE 5 MG PO TABS: 10.0000 mg | ORAL_TABLET | Freq: Every day | ORAL | 0 refills | Status: AC

## 2021-01-15 MED ORDER — ACETAMINOPHEN 325 MG PO TABS
650.0000 mg | ORAL_TABLET | Freq: Four times a day (QID) | ORAL | Status: DC | PRN
  Administered 2021-01-15: 650 mg via ORAL
  Filled 2021-01-15: qty 2

## 2021-01-15 MED ORDER — HYDRALAZINE HCL 25 MG PO TABS: 25.0000 mg | ORAL_TABLET | Freq: Three times a day (TID) | ORAL | Status: DC

## 2021-01-15 MED ORDER — HYDROCODONE-ACETAMINOPHEN 5-325 MG PO TABS: 1.0000 | ORAL_TABLET | Freq: Four times a day (QID) | ORAL | 0 refills | Status: AC | PRN

## 2021-01-15 NOTE — Progress Notes (Signed)
Renal Navigator spoke with patient to confirm plans for outpatient HD at [facility] clinic to start on Monday, 5/23. She states she has information left in room by Navigator yesterday and is understanding. She needs to arrive at 10:15am to complete paperwork prior to first treatment that day. Patient drives, but understands recommendation to ask family/friends to transport to first tx. She states she can ask her father. Patient understands that she needs to have HD before she can leave today and Navigator has requested that she be taken to unit as soon as they can accommodate. Patient appreciative of support and assistance.  Navigator updated Renal PA and asked to send orders to outpatient clinic. Clinic and [facility] Admissions updated on plan.  Alphonzo Cruise, [place] Renal Navigator [phone]

## 2021-01-15 NOTE — Progress Notes (Signed)
Nephrology Follow-Up Consult note   Assessment/Recommendations: Kimberly [name] is a/an 30 y.o. female with a past medical history significant for CKD 5, DM1, HTN  who present w/ new ESRD  CKD 5 now considered ESRD: Progressive renal failure likely diabetic kidney disease now progressed to ESRD needing to start dialysis -IR placed TDC on 5/17, appreciate help -3 of 3 initiation dialysis sessions tomorrow.  Then maintain MWF schedule -Status post aVF with Dr. Donzetta Matters on 5/19; appreciate help -[facility]ed [facility] to [facility] Monday; okay for DC today  Volume Status: Appears acceptable.  Hold home Lasix. Volume mgmt with hd  Hypertension: Hypotension with dialysis.  Stopped hydralazine.  Continue amlodipine for now. HTN today but will continue to titrate meds outpatient  Anemia: Associated with renal failure.  Ordered IV iron.  Consider ESA  Hypocalcemia/secondary hyperparathyroidism: Likely related to secondary hyperparathyroidism with metabolic bone disease secondary to CKD.  Increaseed calcitriol to 0.5 MCG daily.    High calcium bath.  PTH 157.  Phos normal after hd  Type 1 diabetes: Management per primary team.  Uncontrolled with hyperglycemia intermittently    Recommendations conveyed to primary service.    [facility] 01/15/2021 12:48 PM  ___________________________________________________________  CC: ESRD  Interval History/Subjective: Patient feeling a little bit better today.  Arm pain is improving.   Medications:  Current Facility-Administered Medications  Medication Dose Route Frequency Provider Last Rate Last Admin  . acetaminophen (TYLENOL) tablet 650 mg  650 mg Oral Q6H PRN Chotiner, Yevonne Aline, MD   650 mg at 01/15/21 0435  . amLODipine (NORVASC) tablet 5 mg  5 mg Oral Daily Barbie Banner, PA-C   5 mg at 01/15/21 0841  . calcitRIOL (ROCALTROL) capsule 0.5 mcg  0.5 mcg Oral Daily Barbie Banner, PA-C   0.5 mcg at  01/15/21 0841  . calcium carbonate (TUMS - dosed in mg elemental calcium) chewable tablet 400 mg of elemental calcium  2 tablet Oral TID Barbie Banner, PA-C   400 mg of elemental calcium at 01/15/21 0840  . Chlorhexidine Gluconate Cloth 2 % PADS 6 each  6 each Topical Q0600 Barbie Banner, PA-C   6 each at 01/15/21 [id number]  . heparin injection 5,000 Units  5,000 Units Subcutaneous Q8H Barbie Banner, PA-C   5,000 Units at 01/15/21 [id number]  . heparin sodium (porcine) injection 1,000 Units  1,000 Units Intravenous Q dialysis Barbie Banner, PA-C   3,200 Units at 01/14/21 0248  . hydrALAZINE (APRESOLINE) tablet 25 mg  25 mg Oral Q8H Chotiner, Yevonne Aline, MD   25 mg at 01/15/21 0438  . HYDROcodone-acetaminophen (NORCO/VICODIN) 5-325 MG per tablet 1 tablet  1 tablet Oral Q4H PRN Barbie Banner, PA-C   1 tablet at 01/15/21 [phone]  . insulin pump   Subcutaneous TID WC, HS, 0200 Barbie Banner, [place]   Given at 01/14/21 2114  . metoprolol tartrate (LOPRESSOR) tablet 50 mg  50 mg Oral BID Barbie Banner, PA-C   50 mg at 01/15/21 0841  . morphine 2 MG/ML injection 1 mg  1 mg Intravenous Q4H PRN Barbie Banner, PA-C   1 mg at 01/15/21 0408  . sodium bicarbonate tablet 650 mg  650 mg Oral BID Barbie Banner, PA-C   650 mg at 01/15/21 0844  . sodium chloride flush (NS) 0.9 % injection 10-40 mL  10-40 mL Intracatheter Q12H Barbie Banner, PA-C   10 mL at 01/15/21 0841  .  sodium chloride flush (NS) 0.9 % injection 10-40 mL  10-40 mL Intracatheter PRN Barbie Banner, PA-C          Review of Systems: 10 systems reviewed and negative except per interval history/subjective  Physical Exam: Vitals:   01/15/21 1217 01/15/21 1230  BP: (!) 181/92 (!) 185/94  Pulse:    Resp:    Temp:    SpO2:     No intake/output data recorded.  Intake/Output Summary (Last 24 hours) at 01/15/2021 1248 Last data filed at 01/15/2021 0400 Gross per 24 hour  Intake 550.17 ml  Output 800 ml  Net -249.83 ml    Constitutional: well-appearing, no acute distress ENMT: ears and nose without scars or lesions, MMM CV: normal rate Respiratory: bilateral chest rise, normal work of breathing Skin: Bruises along the right arm with some surrounding swelling associated with IV infiltration, no other  visible lesions or rashes Psych: alert, judgement/insight appropriate, appropriate mood and affect   Test Results I personally reviewed new and old clinical labs and radiology tests Lab Results  Component Value Date   NA 139 01/15/2021   K 3.8 01/15/2021   CL 101 01/15/2021   CO2 29 01/15/2021   BUN 26 (H) 01/15/2021   CREATININE 4.77 (H) 01/15/2021   CALCIUM 7.7 (L) 01/15/2021   ALBUMIN 2.5 (L) 01/15/2021   PHOS 4.2 01/15/2021

## 2021-01-15 NOTE — Progress Notes (Signed)
to be D/C'd home per MD order.  Discussed with the patient and all questions fully answered.   VSS, Skin clean, dry and intact without evidence of skin break down, no evidence of skin tears noted. IV catheter discontinued intact. Site without signs and symptoms of complications. Dressing and pressure applied.   An After Visit Summary was printed and given to the patient. Patient received prescription.   D/c education completed with patient/family including follow up instructions, medication list, d/c activities limitations if indicated, with other d/c instructions as indicated by MD - patient able to verbalize understanding, all questions fully answered.    Patient instructed to return to ED, call 911, or call MD for any changes in condition.    Patient escorted via Hydro, and D/C home via private auto.   Thane Edu RN

## 2021-01-15 NOTE — Progress Notes (Signed)
Rounded on patient today secondary to plan for discharge to outpatient HD. Patient was found sitting in the bed and agreeable to conversation with myself and the provider. Patient was able to re verbalize schedule for HD, post-surgical follow-up of her AVF, medications, weights and care of HD catheter. Patient did have question about obtaining a stress ball to exercise her L arm. This RN was able to obtain the neccessary ball for exercise. Patient also had questions secondary to urination as it applies to HD. Educated patient that she will likely see a steady decline in her urination throughout her time on HD, but to be sure that she shares any concerns that may arise with her nephrologist. Patient verbalizes an understanding. Plan to go to HD today and discharge.  Dorthey Sawyer, RN  Dialysis Nurse Coordinator [phone]

## 2021-01-15 NOTE — Discharge Summary (Signed)
PATIENT DETAILS Name: Kimberly [name] Age: 30 y.o. Sex: female Date of Birth: 1991-02-01 MRN: [id number]. Admitting Physician: Para Skeans, MD PPI:[id number], Mindi Curling, MD  Admit Date: 01/11/2021 Discharge date: 01/15/2021  Recommendations for Outpatient Follow-up:  1. Follow up with PCP in 1-2 weeks 2. Please obtain CMP/CBC in one week 3. Please ensure patient follows with her outpatient dialysis unit-she is starting outpatient dialysis on 5/23. 4. Please follow blood cultures until final.  Admitted From:  Home   Disposition: [place]: No  Equipment/Devices: None  Discharge Condition: Stable  CODE STATUS: FULL CODE  Diet recommendation:  Diet Order            Diet - low sodium heart healthy           Diet renal/carb modified with fluid restriction Diet-HS Snack? Nothing; Room service appropriate? Yes; Fluid consistency: Thin  Diet effective now                  Brief Narrative: Patient is a 30 y.o. female with a history of CKD stage V, DM-1 on insulin pump, HTN-presented with altered mental status/weakness-thought to be due to progression of CKD stage V to ESRD-subsequently admitted to the hospitalist service.  See below for further details.  Significant events: 5/16>> admit for lethargy/AMS-due to progression of CKD stage V to ESRD.  Significant studies: 5/8>> Echo: EF 55-60%, moderate LVH, grade 2 diastolic dysfunction. 5/16>> chest x-ray: No pneumonia 5/19>> right upper extremity Doppler: No DVT. 5/20>> chest x-ray: No pneumonia  Antimicrobial therapy: None  Microbiology data: 5/16>> influenza/COVID PCR: Negative 5/20>> blood cultures: Pending  Procedures : 5/17>> tunneled hemodialysis catheter placement by IR 5/19>>Left arm brachial artery to basilic vein AV fistula creation  Consults: Nephrology, IR, vascular surgery  Brief Hospital Course: Lethargy/mild metabolic encephalopathy: Resolved with initiation of  HD.  Progression of CKD stage V to ESRD: Evaluated by nephrology-started on hemodialysis.  IR placed tunneled dialysis catheter-vascular surgery placed AV fistula.  She will complete hemodialysis today in the hospital-following which she will be discharged home to start outpatient hemodialysis this coming Monday.  She did have a low-grade fever overnight but clinically appears very stable-and is actually requesting discharge today.  Chest x-ray done this morning was negative-blood cultures have been collected but are pending-given clinical stability-suspect she could be discharged home with her outpatient physicians following blood cultures.  HTN: BP fluctuating-but remains on the higher side-we will increase amlodipine to 10 mg daily-and continue with metoprolol and hydralazine-suspect as she gets used to hemodialysis-she may require further optimization of antihypertensive regimen.     Anemia: Due to CKD-IV iron/ESA per nephrology.  Right arm swelling: Likely due to IV infiltration-supportive care-Doppler negative for DVT.  DM-1: CBG stable over the past 24 hours-she was n.p.o. for quite a while yesterday as she underwent a left AV fistula placement.  She is in close contact with her primary endocrinologist-Dr. Cathleen Corti further optimization of her insulin pump regimen.  I have asked her to call her primary endocrinologist-for further recommendations now that she is ESRD.  Patient is completely awake/alert-and is very familiar with her insulin pump-she is aware of the signs/symptoms of hypoglycemia and needed interventions.   Discharge Diagnoses:  Principal Problem:   ESRD (end stage renal disease) ([place]) Active Problems:   Type 1 diabetes mellitus with diabetic nephropathy (HCC)   GERD (gastroesophageal reflux disease)   Hypertension associated with diabetes ([place])   Hypoglycemia due to insulin   Discharge Instructions:  Activity:  As tolerated   Discharge Instructions    Call MD  for:  redness, tenderness, or signs of infection (pain, swelling, redness, odor or green/yellow discharge around incision site)   Complete by: As directed    Diet - low sodium heart healthy   Complete by: As directed    Discharge instructions   Complete by: As directed    Follow with Primary MD  Jacelyn Pi, MD in 1-2 weeks  Please get a complete blood count and chemistry panel checked by your Primary MD at your next visit, and again as instructed by your Primary MD.  Get Medicines reviewed and adjusted: Please take all your medications with you for your next visit with your Primary MD  Laboratory/radiological data: Please request your Primary MD to go over all hospital tests and procedure/radiological results at the follow up, please ask your Primary MD to get all Hospital records sent to his/her office.  In some cases, they will be blood work, cultures and biopsy results pending at the time of your discharge. Please request that your primary care M.D. follows up on these results.  Also Note the following: If you experience worsening of your admission symptoms, develop shortness of breath, life threatening emergency, suicidal or homicidal thoughts you must seek medical attention immediately by calling 911 or calling your MD immediately  if symptoms less severe.  You must read complete instructions/literature along with all the possible adverse reactions/side effects for all the Medicines you take and that have been prescribed to you. Take any new Medicines after you have completely understood and accpet all the possible adverse reactions/side effects.   Do not drive when taking Pain medications or sleeping medications (Benzodaizepines)  Do not take more than prescribed Pain, Sleep and Anxiety Medications. It is not advisable to combine anxiety,sleep and pain medications without talking with your primary care practitioner  Special Instructions: If you have smoked or chewed Tobacco  in  the last 2 yrs please stop smoking, stop any regular Alcohol  and or any Recreational drug use.  Wear Seat belts while driving.  Please note: You were cared for by a hospitalist during your hospital stay. Once you are discharged, your primary care physician will handle any further medical issues. Please note that NO REFILLS for any discharge medications will be authorized once you are discharged, as it is imperative that you return to your primary care physician (or establish a relationship with a primary care physician if you do not have one) for your post hospital discharge needs so that they can reassess your need for medications and monitor your lab values.   1.)  Please follow-up with outpatient hemodialysis center as instructed this coming Monday.  2.)  Please ask your primary care practitioner or your primary nephrologist to follow blood cultures that were drawn on 5/20.  3.)  Please call you primary endocrinologist-can see if you need adjustment of your insulin regimen now that you are considered end-stage renal disease and on dialysis.   Increase activity slowly   Complete by: As directed    No dressing needed   Complete by: As directed      Allergies as of 01/15/2021      Reactions   Metoclopramide Other (See Comments)   tardive dyskinesia per outside provider note      Medication List    STOP taking these medications   furosemide 40 MG tablet Commonly known as: Lasix     TAKE these medications  amLODipine 5 MG tablet Commonly known as: NORVASC Take 2 tablets (10 mg total) by mouth daily. What changed: how much to take   Bayer Contour Next Test test strip Generic drug: glucose blood USE AS DIRECTED TO TEST 6 TO 8 TIMES DAILY What changed: See the new instructions.   calcitRIOL 0.25 MCG capsule Commonly known as: ROCALTROL Take 0.25 mcg by mouth daily.   calcium carbonate 500 MG chewable tablet Commonly known as: TUMS - dosed in mg elemental calcium Chew 2  tablets by mouth 3 (three) times daily.   CONTOUR NEXT EZ MONITOR w/Device Kit 1 each by Does not apply route 6 (six) times daily. Provide lancets for testing six times daily. Type I DM with diabetes on Insulin Pump  DX 648.03   cyanocobalamin 1000 MCG/ML injection Commonly known as: (VITAMIN B-12) Inject 1 mL into the muscle every 30 (thirty) days.   etonogestrel 68 MG Impl implant Commonly known as: NEXPLANON Inject 1 each into the skin once.   hydrALAZINE 25 MG tablet Commonly known as: APRESOLINE Take 25 mg by mouth 3 (three) times daily.   HYDROcodone-acetaminophen 5-325 MG tablet Commonly known as: NORCO/VICODIN Take 1 tablet by mouth every 6 (six) hours as needed for up to 3 days for moderate pain.   insulin lispro 100 UNIT/ML injection Commonly known as: HUMALOG 0-9 Units TID and 0-5 units at bedside as per sliding scale What changed:   how much to take  how to take this  when to take this  additional instructions   insulin pump Soln Inject into the skin. Humalog   metoprolol tartrate 50 MG tablet Commonly known as: LOPRESSOR Take 1 tablet (50 mg total) by mouth 2 (two) times daily.   sodium bicarbonate 650 MG tablet Take 650 mg by mouth 2 (two) times daily.            Discharge Care Instructions  (From admission, onward)         Start     Ordered   01/15/21 0000  No dressing needed        01/15/21 0915          Follow-up Information    [facility] In 6 weeks.   Specialty: Vascular Surgery Why: Office will call you to arrange your appt (sent) Contact information: [place]       Jacelyn Pi, MD. Schedule an appointment as soon as possible for a visit in 1 week(s).   Specialty: Endocrinology Contact information: [address] [place]  [id number] [phone]        Outpatient hemodialysis Follow up on 01/18/2021.   Why:  Arrive at 10:15 AM.             Allergies  Allergen Reactions  . Metoclopramide Other (See Comments)    tardive dyskinesia per outside provider note       Other Procedures/Studies: IR Fluoro Guide CV Line Right  Result Date: 01/13/2021 INDICATION: Development of ESRD, in need of intravenous access for the initiation of hemodialysis. EXAM: TUNNELED CENTRAL VENOUS HEMODIALYSIS CATHETER PLACEMENT WITH ULTRASOUND AND FLUOROSCOPIC GUIDANCE MEDICATIONS: The patient is admitted to the hospital and receiving antibiotics. The antibiotic was given in an appropriate time interval prior to skin puncture. ANESTHESIA/SEDATION: Moderate (conscious) sedation was employed during this procedure. A total of Versed 1 mg and Fentanyl 50 mcg was administered intravenously. Moderate Sedation Time: 14 minutes. The patient's level of consciousness and vital signs  were monitored continuously by radiology nursing throughout the procedure under my direct supervision. FLUOROSCOPY TIME:  18 seconds (3 mGy). COMPLICATIONS: None immediate. PROCEDURE: Informed written consent was obtained from the patient after a discussion of the risks, benefits, and alternatives to treatment. Questions regarding the procedure were encouraged and answered. The right neck and chest were prepped with chlorhexidine in a sterile fashion, and a sterile drape was applied covering the operative field. Maximum barrier sterile technique with sterile gowns and gloves were used for the procedure. A timeout was performed prior to the initiation of the procedure. After creating a small venotomy incision, a micropuncture kit was utilized to access the internal jugular vein. Real-time ultrasound guidance was utilized for vascular access including the acquisition of a permanent ultrasound image documenting patency of the accessed vessel. The microwire was utilized to measure appropriate catheter length. A stiff Glidewire was advanced to the level of the IVC  and the micropuncture sheath was exchanged for a peel-away sheath. A Palindrome tunneled hemodialysis catheter measuring 19 cm from tip to cuff was tunneled in a retrograde fashion from the anterior chest wall to the venotomy incision. The catheter was then placed through the peel-away sheath with tips ultimately positioned within the superior aspect of the right atrium. Final catheter positioning was confirmed and documented with a spot radiographic image. The catheter aspirates and flushes normally. The catheter was flushed with appropriate volume heparin dwells. The catheter exit site was secured with a 0-Prolene retention suture. The venotomy incision was closed with Dermabond and Steri-strips. Dressings were applied. The patient tolerated the procedure well without immediate post procedural complication. IMPRESSION: Successful placement of 19 cm tip to cuff tunneled hemodialysis catheter via the right internal jugular vein with tips terminating within the superior aspect of the right atrium. The catheter is ready for immediate use. Electronically Signed   By: Sandi Mariscal M.D.   On: 01/13/2021 08:24   IR US Guide Vasc Access Right  Result Date: 01/13/2021 INDICATION: Development of ESRD, in need of intravenous access for the initiation of hemodialysis. EXAM: TUNNELED CENTRAL VENOUS HEMODIALYSIS CATHETER PLACEMENT WITH ULTRASOUND AND FLUOROSCOPIC GUIDANCE MEDICATIONS: The patient is admitted to the hospital and receiving antibiotics. The antibiotic was given in an appropriate time interval prior to skin puncture. ANESTHESIA/SEDATION: Moderate (conscious) sedation was employed during this procedure. A total of Versed 1 mg and Fentanyl 50 mcg was administered intravenously. Moderate Sedation Time: 14 minutes. The patient's level of consciousness and vital signs were monitored continuously by radiology nursing throughout the procedure under my direct supervision. FLUOROSCOPY TIME:  18 seconds (3 mGy).  COMPLICATIONS: None immediate. PROCEDURE: Informed written consent was obtained from the patient after a discussion of the risks, benefits, and alternatives to treatment. Questions regarding the procedure were encouraged and answered. The right neck and chest were prepped with chlorhexidine in a sterile fashion, and a sterile drape was applied covering the operative field. Maximum barrier sterile technique with sterile gowns and gloves were used for the procedure. A timeout was performed prior to the initiation of the procedure. After creating a small venotomy incision, a micropuncture kit was utilized to access the internal jugular vein. Real-time ultrasound guidance was utilized for vascular access including the acquisition of a permanent ultrasound image documenting patency of the accessed vessel. The microwire was utilized to measure appropriate catheter length. A stiff Glidewire was advanced to the level of the IVC and the micropuncture sheath was exchanged for a peel-away sheath. A Palindrome tunneled hemodialysis catheter measuring  19 cm from tip to cuff was tunneled in a retrograde fashion from the anterior chest wall to the venotomy incision. The catheter was then placed through the peel-away sheath with tips ultimately positioned within the superior aspect of the right atrium. Final catheter positioning was confirmed and documented with a spot radiographic image. The catheter aspirates and flushes normally. The catheter was flushed with appropriate volume heparin dwells. The catheter exit site was secured with a 0-Prolene retention suture. The venotomy incision was closed with Dermabond and Steri-strips. Dressings were applied. The patient tolerated the procedure well without immediate post procedural complication. IMPRESSION: Successful placement of 19 cm tip to cuff tunneled hemodialysis catheter via the right internal jugular vein with tips terminating within the superior aspect of the right atrium. The  catheter is ready for immediate use. Electronically Signed   By: Sandi Mariscal M.D.   On: 01/13/2021 08:24   DG CHEST PORT 1 VIEW  Result Date: 01/15/2021 CLINICAL DATA:  Chest pain EXAM: PORTABLE CHEST 1 VIEW COMPARISON:  Jan 11, 2021 FINDINGS: Central catheter tip is at the cavoatrial junction. No pneumothorax. Heart is mildly enlarged with pulmonary vascularity normal. Lungs clear. No bone lesions. IMPRESSION: Central catheter as described without evident pneumothorax. Heart mildly enlarged. Lungs clear. Electronically Signed   By: Lowella Grip III M.D.   On: 01/15/2021 08:19   DG Chest Portable 1 View  Result Date: 01/11/2021 CLINICAL DATA:  Abnormal breath sounds. Altered mental status and weakness. EXAM: PORTABLE CHEST 1 VIEW COMPARISON:  01/02/2021. FINDINGS: Trachea is midline. Heart is enlarged, stable. Lungs are somewhat low in volume but clear. No pleural fluid. IMPRESSION: No acute findings. Electronically Signed   By: Lorin Picket M.D.   On: 01/11/2021 13:40   DG Chest Portable 1 View  Result Date: 01/02/2021 CLINICAL DATA:  Chest pain and nausea EXAM: PORTABLE CHEST 1 VIEW COMPARISON:  03/19/2018 FINDINGS: The heart size and mediastinal contours are within normal limits. Both lungs are clear. The visualized skeletal structures are unremarkable. IMPRESSION: No active disease. Electronically Signed   By: Inez Catalina M.D.   On: 01/02/2021 16:58   ECHOCARDIOGRAM COMPLETE  Result Date: 01/03/2021    ECHOCARDIOGRAM REPORT   Patient Name:   KIJUANA RUPPEL Date of Exam: 01/03/2021 Medical Rec #:  [id number]         Height:       65.0 in Accession #:    [id number]        Weight:       157.8 lb Date of Birth:  11-Jul-1991         BSA:          1.789 m Patient Age:    30 years          BP:           141/85 mmHg Patient Gender: F                 HR:           79 bpm. Exam Location:  Inpatient Procedure: 2D Echo, Cardiac Doppler, Color Doppler and Strain Analysis Indications:    Chest pain   History:        Patient has no prior history of Echocardiogram examinations.                 Risk Factors:Diabetes.  Sonographer:    [place] Referring Phys: [id number] [place]  1. Left ventricular ejection fraction, by estimation, is  55 to 60%. The left ventricle has normal function. The left ventricle has no regional wall motion abnormalities. There is moderate left ventricular hypertrophy. Left ventricular diastolic parameters are consistent with Grade II diastolic dysfunction (pseudonormalization). The average left ventricular global longitudinal strain is -13.4 %. The global longitudinal strain is abnormal.  2. Right ventricular systolic function is normal. The right ventricular size is mildly enlarged. There is normal pulmonary artery systolic pressure. The estimated right ventricular systolic pressure is 22.2 mmHg.  3. Left atrial size was mildly dilated.  4. The mitral valve is normal in structure. Mild to moderate mitral valve regurgitation. No evidence of mitral stenosis.  5. Tricuspid valve regurgitation is mild to moderate.  6. The aortic valve is tricuspid. Aortic valve regurgitation is trivial. No aortic stenosis is present.  7. The inferior vena cava is dilated in size with >50% respiratory variability, suggesting right atrial pressure of 8 mmHg. FINDINGS  Left Ventricle: Left ventricular ejection fraction, by estimation, is 55 to 60%. The left ventricle has normal function. The left ventricle has no regional wall motion abnormalities. The average left ventricular global longitudinal strain is -13.4 %. The global longitudinal strain is abnormal. The left ventricular internal cavity size was normal in size. There is moderate left ventricular hypertrophy. Left ventricular diastolic parameters are consistent with Grade II diastolic dysfunction (pseudonormalization). Right Ventricle: The right ventricular size is mildly enlarged. No increase in right ventricular wall thickness.  Right ventricular systolic function is normal. There is normal pulmonary artery systolic pressure. The tricuspid regurgitant velocity is 2.58  m/s, and with an assumed right atrial pressure of 8 mmHg, the estimated right ventricular systolic pressure is 97.9 mmHg. Left Atrium: Left atrial size was mildly dilated. Right Atrium: Right atrial size was normal in size. Pericardium: There is no evidence of pericardial effusion. Mitral Valve: The mitral valve is normal in structure. Mild to moderate mitral valve regurgitation. No evidence of mitral valve stenosis. MV peak gradient, 9.6 mmHg. The mean mitral valve gradient is 5.0 mmHg. Tricuspid Valve: The tricuspid valve is normal in structure. Tricuspid valve regurgitation is mild to moderate. No evidence of tricuspid stenosis. Aortic Valve: The aortic valve is tricuspid. Aortic valve regurgitation is trivial. No aortic stenosis is present. Aortic valve mean gradient measures 3.0 mmHg. Aortic valve peak gradient measures 4.8 mmHg. Aortic valve area, by VTI measures 1.84 cm. Pulmonic Valve: The pulmonic valve was normal in structure. Pulmonic valve regurgitation is mild. No evidence of pulmonic stenosis. Aorta: The aortic root is normal in size and structure. Venous: The inferior vena cava is dilated in size with greater than 50% respiratory variability, suggesting right atrial pressure of 8 mmHg. IAS/Shunts: No atrial level shunt detected by color flow Doppler.  LEFT VENTRICLE PLAX 2D LVIDd:         4.20 cm     Diastology LVIDs:         3.00 cm     LV e' medial:    5.87 cm/s LV PW:         1.50 cm     LV E/e' medial:  23.3 LV IVS:        1.50 cm     LV e' lateral:   7.94 cm/s LVOT diam:     1.80 cm     LV E/e' lateral: 17.3 LV SV:         41 LV SV Index:   23          2D Longitudinal Strain  LVOT Area:     2.54 cm    2D Strain GLS Avg:     -13.4 %  LV Volumes (MOD) LV vol d, MOD A2C: 66.6 ml LV vol d, MOD A4C: 72.0 ml LV vol s, MOD A2C: 38.3 ml LV vol s, MOD A4C: 36.2 ml  LV SV MOD A2C:     28.3 ml LV SV MOD A4C:     72.0 ml LV SV MOD BP:      31.8 ml RIGHT VENTRICLE RV S prime:     13.60 cm/s TAPSE (M-mode): 2.5 cm LEFT ATRIUM             Index       RIGHT ATRIUM           Index LA diam:        3.60 cm 2.01 cm/m  RA Area:     15.10 cm LA Vol (A2C):   81.2 ml 45.39 ml/m RA Volume:   38.30 ml  21.41 ml/m LA Vol (A4C):   61.4 ml 34.32 ml/m LA Biplane Vol: 70.7 ml 39.52 ml/m  AORTIC VALVE                   PULMONIC VALVE AV Area (Vmax):    1.74 cm    PV Vmax:       0.71 m/s AV Area (Vmean):   1.92 cm    PV Vmean:      58.500 cm/s AV Area (VTI):     1.84 cm    PV VTI:        0.193 m AV Vmax:           110.00 cm/s PV Peak grad:  2.0 mmHg AV Vmean:          74.800 cm/s PV Mean grad:  1.0 mmHg AV VTI:            0.221 m AV Peak Grad:      4.8 mmHg AV Mean Grad:      3.0 mmHg LVOT Vmax:         75.00 cm/s LVOT Vmean:        56.400 cm/s LVOT VTI:          0.160 m LVOT/AV VTI ratio: 0.72  AORTA Ao Root diam: 2.90 cm Ao Asc diam:  2.80 cm MITRAL VALVE                 TRICUSPID VALVE MV Area (PHT): 4.49 cm      TR Peak grad:   26.6 mmHg MV Area VTI:   1.24 cm      TR Vmax:        258.00 cm/s MV Peak grad:  9.6 mmHg MV Mean grad:  5.0 mmHg      SHUNTS MV Vmax:       1.55 m/s      Systemic VTI:  0.16 m MV Vmean:      103.0 cm/s    Systemic Diam: 1.80 cm MV Decel Time: 169 msec MR Peak grad:    149.1 mmHg MR Mean grad:    105.0 mmHg MR Vmax:         610.50 cm/s MR Vmean:        484.0 cm/s MR PISA:         1.01 cm MR PISA Eff ROA: 4 mm MR PISA Radius:  0.40 cm MV E velocity: 137.00 cm/s MV A velocity: 81.50 cm/s MV E/A ratio:  1.68  Cherlynn Kaiser MD Electronically signed by Cherlynn Kaiser MD Signature Date/Time: 01/03/2021/11:50:42 AM    Final    VAS [place] UPPER EXT VEIN MAPPING (PRE-OP AVF)  Result Date: 01/12/2021 UPPER EXTREMITY VEIN MAPPING Patient Name:  Kimberly [name]  Date of Exam:   01/12/2021 Medical Rec #: [id number]          Accession #:    [id number] Date of Birth:  04/11/1991          Patient Gender: F Patient Age:   30Y Exam Location:  [facility] Procedure:      VAS [place] UPPER EXT VEIN MAPPING (PRE-OP AVF) Referring Phys: [id number] SAMANTHA J RHYNE --------------------------------------------------------------------------------  Indications: Pre-access. History: CKD.  Limitations: Vein size Comparison Study: No prior study Performing Technologist: Maudry Mayhew MHA, RDMS, RVT, RDCS  Examination Guidelines: A complete evaluation includes B-mode imaging, spectral Doppler, color Doppler, and power Doppler as needed of all accessible portions of each vessel. Bilateral testing is considered an integral part of a complete examination. Limited examinations for reoccurring indications may be performed as noted. +-----------------+-------------+----------+--------------+ Right Cephalic   Diameter (cm)Depth (cm)   Findings    +-----------------+-------------+----------+--------------+ Shoulder             0.05                              +-----------------+-------------+----------+--------------+ Prox upper arm       0.07                              +-----------------+-------------+----------+--------------+ Mid upper arm                           not visualized +-----------------+-------------+----------+--------------+ Dist upper arm                          not visualized +-----------------+-------------+----------+--------------+ Antecubital fossa                       not visualized +-----------------+-------------+----------+--------------+ Prox forearm                            not visualized +-----------------+-------------+----------+--------------+ Mid forearm                             not visualized +-----------------+-------------+----------+--------------+ Dist forearm                            not visualized +-----------------+-------------+----------+--------------+ Wrist                                   not  visualized +-----------------+-------------+----------+--------------+ +-----------------+-------------+----------+--------------+ Right Basilic    Diameter (cm)Depth (cm)   Findings    +-----------------+-------------+----------+--------------+ Mid upper arm        0.18                              +-----------------+-------------+----------+--------------+ Dist upper arm       0.23                              +-----------------+-------------+----------+--------------+  Antecubital fossa                       not visualized +-----------------+-------------+----------+--------------+ Prox forearm                            not visualized +-----------------+-------------+----------+--------------+ Mid forearm                             not visualized +-----------------+-------------+----------+--------------+ Distal forearm                          not visualized +-----------------+-------------+----------+--------------+ Wrist                                   not visualized +-----------------+-------------+----------+--------------+ +-----------------+-------------+----------+--------------+ Left Cephalic    Diameter (cm)Depth (cm)   Findings    +-----------------+-------------+----------+--------------+ Shoulder             0.06                              +-----------------+-------------+----------+--------------+ Prox upper arm       0.07                              +-----------------+-------------+----------+--------------+ Mid upper arm        0.08                              +-----------------+-------------+----------+--------------+ Dist upper arm                          not visualized +-----------------+-------------+----------+--------------+ Antecubital fossa                       not visualized +-----------------+-------------+----------+--------------+ Prox forearm                            not visualized  +-----------------+-------------+----------+--------------+ Mid forearm                             not visualized +-----------------+-------------+----------+--------------+ Dist forearm                            not visualized +-----------------+-------------+----------+--------------+ Wrist                                   not visualized +-----------------+-------------+----------+--------------+ +-----------------+-------------+----------+--------------+ Left Basilic     Diameter (cm)Depth (cm)   Findings    +-----------------+-------------+----------+--------------+ Mid upper arm        0.21                              +-----------------+-------------+----------+--------------+ Dist upper arm       0.19                              +-----------------+-------------+----------+--------------+ Antecubital fossa  not visualized +-----------------+-------------+----------+--------------+ Prox forearm                            not visualized +-----------------+-------------+----------+--------------+ Mid forearm                             not visualized +-----------------+-------------+----------+--------------+ Distal forearm                          not visualized +-----------------+-------------+----------+--------------+ Wrist                                   not visualized +-----------------+-------------+----------+--------------+ *See table(s) above for measurements and observations.  Diagnosing physician: Deitra Mayo MD Electronically signed by Deitra Mayo MD on 01/12/2021 at 3:45:12 PM.    Final    VAS [place] UPPER EXTREMITY VENOUS DUPLEX  Result Date: 01/14/2021 UPPER VENOUS STUDY  Patient Name:  Kimberly [name]  Date of Exam:   01/14/2021 Medical Rec #: [id number]          Accession #:    [id number] Date of Birth: [birth year]          Patient Gender: F Patient Age:   89Y Exam Location:  [facility]  Procedure:      VAS [place] UPPER EXTREMITY VENOUS DUPLEX Referring Phys: [id number] [place] --------------------------------------------------------------------------------  Indications: Swelling, and Pain Comparison Study: no prior Performing Technologist: Abram Sander RVS  Examination Guidelines: A complete evaluation includes B-mode imaging, spectral Doppler, color Doppler, and power Doppler as needed of all accessible portions of each vessel. Bilateral testing is considered an integral part of a complete examination. Limited examinations for reoccurring indications may be performed as noted.  Right Findings: +----------+------------+---------+-----------+----------+--------------+ RIGHT     CompressiblePhasicitySpontaneousProperties   Summary     +----------+------------+---------+-----------+----------+--------------+ IJV           Full       Yes       Yes                             +----------+------------+---------+-----------+----------+--------------+ Subclavian    Full       Yes       Yes                             +----------+------------+---------+-----------+----------+--------------+ Axillary      Full       Yes       Yes                             +----------+------------+---------+-----------+----------+--------------+ Brachial      Full       Yes       Yes                             +----------+------------+---------+-----------+----------+--------------+ Radial        Full                                                 +----------+------------+---------+-----------+----------+--------------+ Ulnar  Full                                                 +----------+------------+---------+-----------+----------+--------------+ Cephalic                                            Not visualized +----------+------------+---------+-----------+----------+--------------+ Basilic       Full                                                  +----------+------------+---------+-----------+----------+--------------+  Left Findings: +----------+------------+---------+-----------+----------+-------+ LEFT      CompressiblePhasicitySpontaneousPropertiesSummary +----------+------------+---------+-----------+----------+-------+ Subclavian               Yes       Yes                      +----------+------------+---------+-----------+----------+-------+  Summary:  Right: No evidence of deep vein thrombosis in the upper extremity. No evidence of superficial vein thrombosis in the upper extremity. No evidence of thrombosis in the subclavian.  Left: No evidence of thrombosis in the subclavian.  *See table(s) above for measurements and observations.  Diagnosing physician: Servando Snare MD Electronically signed by Servando Snare MD on 01/14/2021 at 3:35:15 PM.    Final      TODAY-DAY OF DISCHARGE:  Subjective:   Kimberly [name] today has no headache,no chest abdominal pain,no new weakness tingling or numbness, feels much better wants to go home today.  Objective:   Blood pressure (!) 184/77, pulse 91, temperature 100.2 F (37.9 C), temperature source Oral, resp. rate 19, height 5' 5"  (1.651 m), weight 73 kg, last menstrual period 01/08/2021, SpO2 100 %.  Intake/Output Summary (Last 24 hours) at 01/15/2021 0916 Last data filed at 01/15/2021 0400 Gross per 24 hour  Intake 550.17 ml  Output 800 ml  Net -249.83 ml   Filed Weights   01/12/21 2147 01/14/21 0020 01/14/21 1535  Weight: 75.6 kg 74.3 kg 73 kg    Exam: Awake Alert, Oriented *3, No new F.N deficits, Normal affect Bernice.AT,PERRAL Supple Neck,No JVD, No cervical lymphadenopathy appriciated.  Symmetrical Chest wall movement, Good air movement bilaterally, CTAB RRR,No Gallops,Rubs or new Murmurs, No Parasternal Heave +ve B.Sounds, Abd Soft, Non tender, No organomegaly appriciated, No rebound -guarding or rigidity. No Cyanosis, Clubbing or edema, No new Rash or  bruise   PERTINENT RADIOLOGIC STUDIES: DG CHEST PORT 1 VIEW  Result Date: 01/15/2021 CLINICAL DATA:  Chest pain EXAM: PORTABLE CHEST 1 VIEW COMPARISON:  Jan 11, 2021 FINDINGS: Central catheter tip is at the cavoatrial junction. No pneumothorax. Heart is mildly enlarged with pulmonary vascularity normal. Lungs clear. No bone lesions. IMPRESSION: Central catheter as described without evident pneumothorax. Heart mildly enlarged. Lungs clear. Electronically Signed   By: Lowella Grip III M.D.   On: 01/15/2021 08:19   VAS [place] UPPER EXTREMITY VENOUS DUPLEX  Result Date: 01/14/2021 UPPER VENOUS STUDY  Patient Name:  Kimberly [name]  Date of Exam:   01/14/2021 Medical Rec #: [id number]          Accession #:    [id number] Date of Birth: 01/09/1991  Patient Gender: F Patient Age:   59Y Exam Location:  [facility] Procedure:      VAS [place] UPPER EXTREMITY VENOUS DUPLEX Referring Phys: [id number] [place] --------------------------------------------------------------------------------  Indications: Swelling, and Pain Comparison Study: no prior Performing Technologist: Abram Sander RVS  Examination Guidelines: A complete evaluation includes B-mode imaging, spectral Doppler, color Doppler, and power Doppler as needed of all accessible portions of each vessel. Bilateral testing is considered an integral part of a complete examination. Limited examinations for reoccurring indications may be performed as noted.  Right Findings: +----------+------------+---------+-----------+----------+--------------+ RIGHT     CompressiblePhasicitySpontaneousProperties   Summary     +----------+------------+---------+-----------+----------+--------------+ IJV           Full       Yes       Yes                             +----------+------------+---------+-----------+----------+--------------+ Subclavian    Full       Yes       Yes                              +----------+------------+---------+-----------+----------+--------------+ Axillary      Full       Yes       Yes                             +----------+------------+---------+-----------+----------+--------------+ Brachial      Full       Yes       Yes                             +----------+------------+---------+-----------+----------+--------------+ Radial        Full                                                 +----------+------------+---------+-----------+----------+--------------+ Ulnar         Full                                                 +----------+------------+---------+-----------+----------+--------------+ Cephalic                                            Not visualized +----------+------------+---------+-----------+----------+--------------+ Basilic       Full                                                 +----------+------------+---------+-----------+----------+--------------+  Left Findings: +----------+------------+---------+-----------+----------+-------+ LEFT      CompressiblePhasicitySpontaneousPropertiesSummary +----------+------------+---------+-----------+----------+-------+ Subclavian               Yes       Yes                      +----------+------------+---------+-----------+----------+-------+  Summary:  Right: No evidence of  deep vein thrombosis in the upper extremity. No evidence of superficial vein thrombosis in the upper extremity. No evidence of thrombosis in the subclavian.  Left: No evidence of thrombosis in the subclavian.  *See table(s) above for measurements and observations.  Diagnosing physician: Servando Snare MD Electronically signed by Servando Snare MD on 01/14/2021 at 3:35:15 PM.    Final      PERTINENT LAB RESULTS: CBC: Recent Labs    01/13/21 0109 01/15/21 0548  WBC 11.4* 14.6*  HGB 9.5* 8.8*  HCT 28.3* 26.6*  PLT 157 154   CMET CMP     Component Value Date/Time   NA 139 01/15/2021  0123   K 3.8 01/15/2021 0123   CL 101 01/15/2021 0123   CO2 29 01/15/2021 0123   GLUCOSE 98 01/15/2021 0123   BUN 26 (H) 01/15/2021 0123   CREATININE 4.77 (H) 01/15/2021 0123   CREATININE 0.48 (L) 10/07/2013 0834   CALCIUM 7.7 (L) 01/15/2021 0123   CALCIUM 5.6 (LL) 12/31/2020 1341   PROT 6.9 01/11/2021 1153   ALBUMIN 2.5 (L) 01/15/2021 0123   AST 10 (L) 01/11/2021 1153   ALT 10 01/11/2021 1153   ALKPHOS 91 01/11/2021 1153   BILITOT 0.4 01/11/2021 1153   GFRNONAA 12 (L) 01/15/2021 0123   GFRAA 26 (L) 12/03/2019 0153    GFR Estimated Creatinine Clearance: 16.9 mL/min (A) (by C-G formula based on SCr of 4.77 mg/dL (H)). No results for input(s): LIPASE, AMYLASE in the last 72 hours. No results for input(s): CKTOTAL, CKMB, CKMBINDEX, TROPONINI in the last 72 hours. Invalid input(s): POCBNP No results for input(s): DDIMER in the last 72 hours. No results for input(s): HGBA1C in the last 72 hours. No results for input(s): CHOL, HDL, LDLCALC, TRIG, CHOLHDL, LDLDIRECT in the last 72 hours. No results for input(s): TSH, T4TOTAL, T3FREE, THYROIDAB in the last 72 hours.  Invalid input(s): FREET3 No results for input(s): VITAMINB12, FOLATE, FERRITIN, TIBC, IRON, RETICCTPCT in the last 72 hours. Coags: No results for input(s): INR in the last 72 hours.  Invalid input(s): PT Microbiology: Recent Results (from the past 240 hour(s))  Resp Panel by RT-PCR (Flu A&B, Covid) Nasopharyngeal Swab     Status: None   Collection Time: 01/11/21  3:05 PM   Specimen: Nasopharyngeal Swab; Nasopharyngeal(NP) swabs in vial transport medium  Result Value Ref Range Status   SARS Coronavirus 2 by RT PCR NEGATIVE NEGATIVE Final    Comment: (NOTE) SARS-CoV-2 target nucleic acids are NOT DETECTED.  The SARS-CoV-2 RNA is generally detectable in upper respiratory specimens during the acute phase of infection. The lowest concentration of SARS-CoV-2 viral copies this assay can detect is 138 copies/mL. A  negative result does not preclude SARS-Cov-2 infection and should not be used as the sole basis for treatment or other patient management decisions. A negative result may occur with  improper specimen collection/handling, submission of specimen other than nasopharyngeal swab, presence of viral mutation(s) within the areas targeted by this assay, and inadequate number of viral copies(<138 copies/mL). A negative result must be combined with clinical observations, patient history, and epidemiological information. The expected result is Negative.  Fact Sheet for Patients:  EntrepreneurPulse.com.au  Fact Sheet for Healthcare Providers:  IncredibleEmployment.be  This test is no t yet approved or cleared by the [place] FDA and  has been authorized for detection and/or diagnosis of SARS-CoV-2 by FDA under an Emergency Use Authorization (EUA). This EUA will remain  in effect (meaning this test can be used) for the  duration of the COVID-19 declaration under Section 564(b)(1) of the Act, 21 U.S.C.section 360bbb-3(b)(1), unless the authorization is terminated  or revoked sooner.       Influenza A by PCR NEGATIVE NEGATIVE Final   Influenza B by PCR NEGATIVE NEGATIVE Final    Comment: (NOTE) The Xpert Xpress SARS-CoV-2/FLU/RSV plus assay is intended as an aid in the diagnosis of influenza from Nasopharyngeal swab specimens and should not be used as a sole basis for treatment. Nasal washings and aspirates are unacceptable for Xpert Xpress SARS-CoV-2/FLU/RSV testing.  Fact Sheet for Patients: EntrepreneurPulse.com.au  Fact Sheet for Healthcare Providers: IncredibleEmployment.be  This test is not yet approved or cleared by the [place] FDA and has been authorized for detection and/or diagnosis of SARS-CoV-2 by FDA under an Emergency Use Authorization (EUA). This EUA will remain in effect (meaning this test can  be used) for the duration of the COVID-19 declaration under Section 564(b)(1) of the Act, 21 U.S.C. section 360bbb-3(b)(1), unless the authorization is terminated or revoked.  Performed at [facility], [place] [address], [place], [place] [address]   MRSA PCR Screening     Status: None   Collection Time: 01/11/21 10:28 PM   Specimen: Nasal Mucosa; Nasopharyngeal  Result Value Ref Range Status   MRSA by PCR NEGATIVE NEGATIVE Final    Comment:        The GeneXpert MRSA Assay (FDA approved for NASAL specimens only), is one component of a comprehensive MRSA colonization surveillance program. It is not intended to diagnose MRSA infection nor to guide or monitor treatment for MRSA infections. Performed at [facility], [place] [address], [place], [place] [address]     FURTHER DISCHARGE INSTRUCTIONS:  Get Medicines reviewed and adjusted: Please take all your medications with you for your next visit with your Primary MD  Laboratory/radiological data: Please request your Primary MD to go over all hospital tests and procedure/radiological results at the follow up, please ask your Primary MD to get all Hospital records sent to his/her office.  In some cases, they will be blood work, cultures and biopsy results pending at the time of your discharge. Please request that your primary care M.D. goes through all the records of your hospital data and follows up on these results.  Also Note the following: If you experience worsening of your admission symptoms, develop shortness of breath, life threatening emergency, suicidal or homicidal thoughts you must seek medical attention immediately by calling 911 or calling your MD immediately  if symptoms less severe.  You must read complete instructions/literature along with all the possible adverse reactions/side effects for all the Medicines you take and that have been prescribed to you. Take any new Medicines after you have completely  understood and accpet all the possible adverse reactions/side effects.   Do not drive when taking Pain medications or sleeping medications (Benzodaizepines)  Do not take more than prescribed Pain, Sleep and Anxiety Medications. It is not advisable to combine anxiety,sleep and pain medications without talking with your primary care practitioner  Special Instructions: If you have smoked or chewed Tobacco  in the last 2 yrs please stop smoking, stop any regular Alcohol  and or any Recreational drug use.  Wear Seat belts while driving.  Please note: You were cared for by a hospitalist during your hospital stay. Once you are discharged, your primary care physician will handle any further medical issues. Please note that NO REFILLS for any discharge medications will be authorized once you  are discharged, as it is imperative that you return to your primary care physician (or establish a relationship with a primary care physician if you do not have one) for your post hospital discharge needs so that they can reassess your need for medications and monitor your lab values.  Total Time spent coordinating discharge including counseling, education and face to face time equals 35 minutes.  SignedOren Binet 01/15/2021 9:16 AM

## 2021-01-15 NOTE — Progress Notes (Addendum)
  Postoperative hemodialysis access     Date of Surgery:  01/14/2021 Surgeon: Donzetta Matters  Subjective:  Sitting up in bed; denies pain in her left hand  PHYSICAL EXAMINATION:  Vitals:   01/15/21 0134 01/15/21 0400  BP:  (!) 184/77  Pulse: 89 91  Resp:    Temp:  100.2 F (37.9 C)  SpO2:  100%    Incision is clean and dry Sensation in digits is intact;  There is  Thrill  The fistula is palpable  The left radial pulse is easily palpable Motor and sensation in tact left hand  ASSESSMENT/PLAN:  Kimberly [name] is a 30 y.o. year old female who is s/p left 1st stage BVT.  -fistula is patent -pt does not have evidence of steal sx -f/u with [facility] in 6 weeks to check maturation of AVF.  If it matures nicely, will plan for 2nd stage BVT.  Our office will arrange appt.  She knows to call if she has any issues before her visit.  -will sign off-call as needed.   Leontine Locket, PA-C [facility] [phone]  I have interviewed the patient and examined the patient. I agree with the findings by the PA.  Her for stage left basilic vein transposition has a good thrill and she has a palpable radial pulse.  We will be available as needed.  Gae Gallop, MD [phone]

## 2021-01-15 NOTE — Progress Notes (Signed)
Notified Dr Joylene Grapes regarding high BP and ordered to increase goal by 52m more. Pt is NAD

## 2021-01-15 NOTE — Discharge Instructions (Signed)
   [facility]  Discharge Instructions  AV Fistula or Graft Surgery for Dialysis Access  Please refer to the following instructions for your post-procedure care. Your surgeon or physician assistant will discuss any changes with you.  Activity  You may drive the day following your surgery, if you are comfortable and no longer taking prescription pain medication. Resume full activity as the soreness in your incision resolves.  Bathing/Showering  You may shower after you go home. Keep your incision dry for 48 hours. Do not soak in a bathtub, hot tub, or swim until the incision heals completely. You may not shower if you have a hemodialysis catheter.  Incision Care  Clean your incision with mild soap and water after 48 hours. Pat the area dry with a clean towel. You do not need a bandage unless otherwise instructed. Do not apply any ointments or creams to your incision. You may have skin glue on your incision. Do not peel it off. It will come off on its own in about one week. Your arm may swell a bit after surgery. To reduce swelling use pillows to elevate your arm so it is above your heart. Your doctor will tell you if you need to lightly wrap your arm with an ACE bandage.  Diet  Resume your normal diet. There are not special food restrictions following this procedure. In order to heal from your surgery, it is CRITICAL to get adequate nutrition. Your body requires vitamins, minerals, and protein. Vegetables are the best source of vitamins and minerals. Vegetables also provide the perfect balance of protein. Processed food has little nutritional value, so try to avoid this.  Medications  Resume taking all of your medications. If your incision is causing pain, you may take over-the counter pain relievers such as acetaminophen (Tylenol). If you were prescribed a stronger pain medication, please be aware these medications can cause nausea and constipation. Prevent  nausea by taking the medication with a snack or meal. Avoid constipation by drinking plenty of fluids and eating foods with high amount of fiber, such as fruits, vegetables, and grains.  Do not take Tylenol if you are taking prescription pain medications.  Follow up Your surgeon may want to see you in the office following your access surgery. If so, this will be arranged at the time of your surgery.  Please call us immediately for any of the following conditions:  . Increased pain, redness, drainage (pus) from your incision site . Fever of 101 degrees or higher . Severe or worsening pain at your incision site . Hand pain or numbness. .  Reduce your risk of vascular disease:  . Stop smoking. If you would like help, call [facility] at 1-800-QUIT-NOW 417-560-9968) or [place] at [phone]  . Manage your cholesterol . Maintain a desired weight . Control your diabetes . Keep your blood pressure down  Dialysis  It will take several weeks to several months for your new dialysis access to be ready for use. Your surgeon will determine when it is okay to use it. Your nephrologist will continue to direct your dialysis. You can continue to use your Permcath until your new access is ready for use.   01/15/2021 Kimberly [name] [id number] 12/19/1990  Surgeon(s): Waynetta Sandy, MD  Procedure(s): Creation of left 1st stage basilic vein transposition  x Do not stick fistula for 12 weeks    If you have any questions, please call the office at [phone].

## 2021-01-16 ENCOUNTER — Telehealth: Payer: Self-pay | Admitting: Nurse Practitioner

## 2021-01-16 NOTE — Telephone Encounter (Signed)
Transition of care contact from inpatient facility  Date of Discharge: 01/15/2021 Date of Contact: 01/16/2021 Method of contact: Phone  Attempted to contact patient to discuss transition of care from inpatient admission. Patient did not answer the phone. Message was left on the patient's voicemail with call back number [phone].

## 2021-01-20 LAB — CULTURE, BLOOD (ROUTINE X 2)
Culture: NO GROWTH
Culture: NO GROWTH
Special Requests: ADEQUATE
Special Requests: ADEQUATE

## 2021-01-21 ENCOUNTER — Other Ambulatory Visit: Payer: Self-pay | Admitting: *Deleted

## 2021-01-21 DIAGNOSIS — N186 End stage renal disease: Secondary | ICD-10-CM

## 2021-01-28 ENCOUNTER — Encounter (HOSPITAL_COMMUNITY): Payer: No Typology Code available for payment source

## 2021-02-01 DIAGNOSIS — R0602 Shortness of breath: Secondary | ICD-10-CM | POA: Insufficient documentation

## 2021-02-01 DIAGNOSIS — D509 Iron deficiency anemia, unspecified: Secondary | ICD-10-CM | POA: Insufficient documentation

## 2021-02-01 DIAGNOSIS — N2581 Secondary hyperparathyroidism of renal origin: Secondary | ICD-10-CM | POA: Insufficient documentation

## 2021-02-01 DIAGNOSIS — Z111 Encounter for screening for respiratory tuberculosis: Secondary | ICD-10-CM | POA: Insufficient documentation

## 2021-02-01 DIAGNOSIS — T7840XA Allergy, unspecified, initial encounter: Secondary | ICD-10-CM | POA: Insufficient documentation

## 2021-02-01 DIAGNOSIS — D689 Coagulation defect, unspecified: Secondary | ICD-10-CM | POA: Insufficient documentation

## 2021-02-01 DIAGNOSIS — Z4931 Encounter for adequacy testing for hemodialysis: Secondary | ICD-10-CM | POA: Insufficient documentation

## 2021-02-01 DIAGNOSIS — T782XXA Anaphylactic shock, unspecified, initial encounter: Secondary | ICD-10-CM | POA: Insufficient documentation

## 2021-02-11 DIAGNOSIS — E8809 Other disorders of plasma-protein metabolism, not elsewhere classified: Secondary | ICD-10-CM | POA: Insufficient documentation

## 2021-02-25 ENCOUNTER — Encounter (HOSPITAL_COMMUNITY): Payer: No Typology Code available for payment source

## 2021-02-26 ENCOUNTER — Ambulatory Visit (HOSPITAL_COMMUNITY)
Admission: RE | Admit: 2021-02-26 | Discharge: 2021-02-26 | Disposition: A | Discharge status: Home / Self Care | Payer: No Typology Code available for payment source | Source: Ambulatory Visit | Attending: Vascular Surgery | Admitting: Vascular Surgery

## 2021-02-26 ENCOUNTER — Telehealth: Payer: Self-pay

## 2021-02-26 ENCOUNTER — Ambulatory Visit (INDEPENDENT_AMBULATORY_CARE_PROVIDER_SITE_OTHER): Payer: No Typology Code available for payment source | Admitting: Physician Assistant

## 2021-02-26 ENCOUNTER — Other Ambulatory Visit: Payer: Self-pay

## 2021-02-26 VITALS — BP 108/73 | HR 89 | Temp 97.4°F | Resp 14 | Ht 65.0 in | Wt 147.0 lb

## 2021-02-26 DIAGNOSIS — N186 End stage renal disease: Secondary | ICD-10-CM | POA: Insufficient documentation

## 2021-02-26 NOTE — Telephone Encounter (Signed)
error 

## 2021-02-26 NOTE — H&P (View-Only) (Signed)
Office Note     CC:  follow up Requesting Provider:  Jacelyn Pi, MD  HPI: Kimberly [name] is a 30 y.o. (Jul 17, 1991) female who presents status post left first stage basilic vein fistula creation by Dr. Donzetta Matters on 01/14/2021.  She denies signs or symptoms of steal syndrome.  She states her incision is well-healed.  She is dialyzing via right IJ [facility] on a Tuesday Thursday Saturday schedule.  She is willing to proceed with second stage basilic vein transposition.  Past Medical History:  Diagnosis Date   Diabetes mellitus without complication ([place])    Preeclampsia    Preeclampsia without severe features    Sickle cell trait [facility])     Past Surgical History:  Procedure Laterality Date   AV FISTULA PLACEMENT Left 01/14/2021   Procedure: ARTERIOVENOUS (AV) FISTULA CREATION;  Surgeon: Waynetta Sandy, MD;  Location: [place];  Service: Vascular;  Laterality: Left;   ESOPHAGOGASTRODUODENOSCOPY  08/2018   [place] and a gastric enptying study    IR FLUORO GUIDE CV LINE RIGHT  01/12/2021   IR US GUIDE VASC ACCESS RIGHT  01/12/2021   WISDOM TOOTH EXTRACTION      Social History   Socioeconomic History   Marital status: Single    Spouse name: n/a   Number of children: 0   Years of education: college   Highest education level: Not on file  Occupational History   Occupation: Stage manager: [employer]  Tobacco Use   Smoking status: Never   Smokeless tobacco: Never  Vaping Use   Vaping Use: Never used  Substance and Sexual Activity   Alcohol use: No   Drug use: No   Sexual activity: Yes    Partners: Male    Birth control/protection: Condom  Other Topics Concern   Not on file  Social History Narrative   Lives alone.  Her father lives in [place], her brother in [place].  Student at [employer].   Social Determinants of Health   Financial Resource Strain: Not on file  Food Insecurity: Not on file   Transportation Needs: Not on file  Physical Activity: Not on file  Stress: Not on file  Social Connections: Not on file  Intimate Partner Violence: Not on file    Family History  Problem Relation Age of Onset   Lupus Mother    Aneurysm Mother    Colon polyps Maternal Aunt        great aunt   Colon cancer Neg Hx    Esophageal cancer Neg Hx     Current Outpatient Medications  Medication Sig Dispense Refill   amLODipine (NORVASC) 5 MG tablet Take 2 tablets (10 mg total) by mouth daily. 30 tablet 0   Blood Glucose Monitoring Suppl (CONTOUR NEXT EZ MONITOR) W/DEVICE KIT 1 each by Does not apply route 6 (six) times daily. Provide lancets for testing six times daily. Type I DM with diabetes on Insulin Pump  DX 648.03 1 kit 6   calcitRIOL (ROCALTROL) 0.25 MCG capsule Take 0.25 mcg by mouth daily.     calcium carbonate (TUMS - DOSED IN MG ELEMENTAL CALCIUM) 500 MG chewable tablet Chew 2 tablets by mouth 3 (three) times daily.     cyanocobalamin (,VITAMIN B-12,) 1000 MCG/ML injection Inject 1 mL into the muscle every 30 (thirty) days.     etonogestrel (NEXPLANON) 68 MG IMPL implant Inject 1 each into the skin once.  hydrALAZINE (APRESOLINE) 25 MG tablet Take 25 mg by mouth 3 (three) times daily.     Insulin Human (INSULIN PUMP) SOLN Inject into the skin. Humalog     insulin lispro (HUMALOG) 100 UNIT/ML injection 0-9 Units TID and 0-5 units at bedside as per sliding scale (Patient taking differently: Inject 0-6 Units into the skin See admin instructions. 0-6 Units three times a day per sliding scale; Via insulin pump; INJECT 100 UNITS MAX DAILY DOSEAGE VIA INSULIN PUMP UNDER THE SKIN.) 10 mL 3   metoprolol tartrate (LOPRESSOR) 50 MG tablet Take 1 tablet (50 mg total) by mouth 2 (two) times daily. 60 tablet 0   sodium bicarbonate 650 MG tablet Take 650 mg by mouth 2 (two) times daily.     BAYER CONTOUR NEXT TEST test strip USE AS DIRECTED TO TEST 6 TO 8 TIMES DAILY (Patient not taking:  Reported on 02/26/2021) 1 each 2   No current facility-administered medications for this visit.    Allergies  Allergen Reactions   Metoclopramide Other (See Comments)    tardive dyskinesia per outside provider note      REVIEW OF SYSTEMS:   [X]  denotes positive finding, [ ]  denotes negative finding Cardiac  Comments:  Chest pain or chest pressure:    Shortness of breath upon exertion:    Short of breath when lying flat:    Irregular heart rhythm:        Vascular    Pain in calf, thigh, or hip brought on by ambulation:    Pain in feet at night that wakes you up from your sleep:     Blood clot in your veins:    Leg swelling:         Pulmonary    Oxygen at home:    Productive cough:     Wheezing:         Neurologic    Sudden weakness in arms or legs:     Sudden numbness in arms or legs:     Sudden onset of difficulty speaking or slurred speech:    Temporary loss of vision in one eye:     Problems with dizziness:         Gastrointestinal    Blood in stool:     Vomited blood:         Genitourinary    Burning when urinating:     Blood in urine:        Psychiatric    Major depression:         Hematologic    Bleeding problems:    Problems with blood clotting too easily:        Skin    Rashes or ulcers:        Constitutional    Fever or chills:      PHYSICAL EXAMINATION:  Vitals:   02/26/21 1153  BP: 108/73  Pulse: 89  Resp: 14  Temp: (!) 97.4 F (36.3 C)  TempSrc: Temporal  SpO2: 96%  Weight: 147 lb 0.8 oz (66.7 kg)  Height: 5' 5"  (1.651 m)    General:  WDWN in NAD; vital signs documented above Gait: Not observed HENT: WNL, normocephalic Pulmonary: normal non-labored breathing Cardiac: regular HR Abdomen: soft, NT, no masses Skin: without rashes Vascular Exam/Pulses:  Right Left  Radial 2+ (normal) 2+ (normal)   Extremities: Left arm incision well-healed; palpable thrill near the arterial anastomosis Musculoskeletal: no muscle wasting or  atrophy  Neurologic: A&O X 3;  No focal  weakness or paresthesias are detected Psychiatric:  The pt has Normal affect.   Non-Invasive Vascular Imaging:   Mature basilic vein fistula left arm    ASSESSMENT/PLAN:: 30 y.o. female status post first stage basilic vein fistula creation  -Patent left arm basilic vein fistula without signs or symptoms of steal syndrome -Based on physical exam and duplex, fistula is ready for second stage of surgery -Plan will be for left arm second stage basilic vein transposition by Dr. Donzetta Matters in the next few weeks -Risk, benefits, and alternatives to access surgery were discussed.   -The patient is aware the risks include but are not limited to: bleeding, infection, steal syndrome, nerve damage, thrombosis, failure to mature, and need for additional procedures.   The patient agrees to proceed with the procedure.    Dagoberto Ligas, PA-C [facility] [phone]  Clinic MD:   Donzetta Matters

## 2021-02-26 NOTE — Progress Notes (Signed)
Office Note     CC:  follow up Requesting Provider:  Jacelyn Pi, MD  HPI: Kimberly [name] is a 30 y.o. (05/29/1991) female who presents status post left first stage basilic vein fistula creation by Dr. Donzetta Matters on 01/14/2021.  She denies signs or symptoms of steal syndrome.  She states her incision is well-healed.  She is dialyzing via right IJ [facility] on a Tuesday Thursday Saturday schedule.  She is willing to proceed with second stage basilic vein transposition.  Past Medical History:  Diagnosis Date   Diabetes mellitus without complication ([place])    Preeclampsia    Preeclampsia without severe features    Sickle cell trait [facility])     Past Surgical History:  Procedure Laterality Date   AV FISTULA PLACEMENT Left 01/14/2021   Procedure: ARTERIOVENOUS (AV) FISTULA CREATION;  Surgeon: Waynetta Sandy, MD;  Location: [place];  Service: Vascular;  Laterality: Left;   ESOPHAGOGASTRODUODENOSCOPY  08/2018   [place] and a gastric enptying study    IR FLUORO GUIDE CV LINE RIGHT  01/12/2021   IR US GUIDE VASC ACCESS RIGHT  01/12/2021   WISDOM TOOTH EXTRACTION      Social History   Socioeconomic History   Marital status: Single    Spouse name: n/a   Number of children: 0   Years of education: college   Highest education level: Not on file  Occupational History   Occupation: Stage manager: [employer]  Tobacco Use   Smoking status: Never   Smokeless tobacco: Never  Vaping Use   Vaping Use: Never used  Substance and Sexual Activity   Alcohol use: No   Drug use: No   Sexual activity: Yes    Partners: Male    Birth control/protection: Condom  Other Topics Concern   Not on file  Social History Narrative   Lives alone.  Her father lives in [place], her brother in [place].  Student at [employer].   Social Determinants of Health   Financial Resource Strain: Not on file  Food Insecurity: Not on file   Transportation Needs: Not on file  Physical Activity: Not on file  Stress: Not on file  Social Connections: Not on file  Intimate Partner Violence: Not on file    Family History  Problem Relation Age of Onset   Lupus Mother    Aneurysm Mother    Colon polyps Maternal Aunt        great aunt   Colon cancer Neg Hx    Esophageal cancer Neg Hx     Current Outpatient Medications  Medication Sig Dispense Refill   amLODipine (NORVASC) 5 MG tablet Take 2 tablets (10 mg total) by mouth daily. 30 tablet 0   Blood Glucose Monitoring Suppl (CONTOUR NEXT EZ MONITOR) W/DEVICE KIT 1 each by Does not apply route 6 (six) times daily. Provide lancets for testing six times daily. Type I DM with diabetes on Insulin Pump  DX 648.03 1 kit 6   calcitRIOL (ROCALTROL) 0.25 MCG capsule Take 0.25 mcg by mouth daily.     calcium carbonate (TUMS - DOSED IN MG ELEMENTAL CALCIUM) 500 MG chewable tablet Chew 2 tablets by mouth 3 (three) times daily.     cyanocobalamin (,VITAMIN B-12,) 1000 MCG/ML injection Inject 1 mL into the muscle every 30 (thirty) days.     etonogestrel (NEXPLANON) 68 MG IMPL implant Inject 1 each into the skin once.  hydrALAZINE (APRESOLINE) 25 MG tablet Take 25 mg by mouth 3 (three) times daily.     Insulin Human (INSULIN PUMP) SOLN Inject into the skin. Humalog     insulin lispro (HUMALOG) 100 UNIT/ML injection 0-9 Units TID and 0-5 units at bedside as per sliding scale (Patient taking differently: Inject 0-6 Units into the skin See admin instructions. 0-6 Units three times a day per sliding scale; Via insulin pump; INJECT 100 UNITS MAX DAILY DOSEAGE VIA INSULIN PUMP UNDER THE SKIN.) 10 mL 3   metoprolol tartrate (LOPRESSOR) 50 MG tablet Take 1 tablet (50 mg total) by mouth 2 (two) times daily. 60 tablet 0   sodium bicarbonate 650 MG tablet Take 650 mg by mouth 2 (two) times daily.     BAYER CONTOUR NEXT TEST test strip USE AS DIRECTED TO TEST 6 TO 8 TIMES DAILY (Patient not taking:  Reported on 02/26/2021) 1 each 2   No current facility-administered medications for this visit.    Allergies  Allergen Reactions   Metoclopramide Other (See Comments)    tardive dyskinesia per outside provider note      REVIEW OF SYSTEMS:   [X]  denotes positive finding, [ ]  denotes negative finding Cardiac  Comments:  Chest pain or chest pressure:    Shortness of breath upon exertion:    Short of breath when lying flat:    Irregular heart rhythm:        Vascular    Pain in calf, thigh, or hip brought on by ambulation:    Pain in feet at night that wakes you up from your sleep:     Blood clot in your veins:    Leg swelling:         Pulmonary    Oxygen at home:    Productive cough:     Wheezing:         Neurologic    Sudden weakness in arms or legs:     Sudden numbness in arms or legs:     Sudden onset of difficulty speaking or slurred speech:    Temporary loss of vision in one eye:     Problems with dizziness:         Gastrointestinal    Blood in stool:     Vomited blood:         Genitourinary    Burning when urinating:     Blood in urine:        Psychiatric    Major depression:         Hematologic    Bleeding problems:    Problems with blood clotting too easily:        Skin    Rashes or ulcers:        Constitutional    Fever or chills:      PHYSICAL EXAMINATION:  Vitals:   02/26/21 1153  BP: 108/73  Pulse: 89  Resp: 14  Temp: (!) 97.4 F (36.3 C)  TempSrc: Temporal  SpO2: 96%  Weight: 147 lb 0.8 oz (66.7 kg)  Height: 5' 5"  (1.651 m)    General:  WDWN in NAD; vital signs documented above Gait: Not observed HENT: WNL, normocephalic Pulmonary: normal non-labored breathing Cardiac: regular HR Abdomen: soft, NT, no masses Skin: without rashes Vascular Exam/Pulses:  Right Left  Radial 2+ (normal) 2+ (normal)   Extremities: Left arm incision well-healed; palpable thrill near the arterial anastomosis Musculoskeletal: no muscle wasting or  atrophy  Neurologic: A&O X 3;  No focal  weakness or paresthesias are detected Psychiatric:  The pt has Normal affect.   Non-Invasive Vascular Imaging:   Mature basilic vein fistula left arm    ASSESSMENT/PLAN:: 30 y.o. female status post first stage basilic vein fistula creation  -Patent left arm basilic vein fistula without signs or symptoms of steal syndrome -Based on physical exam and duplex, fistula is ready for second stage of surgery -Plan will be for left arm second stage basilic vein transposition by Dr. Donzetta Matters in the next few weeks -Risk, benefits, and alternatives to access surgery were discussed.   -The patient is aware the risks include but are not limited to: bleeding, infection, steal syndrome, nerve damage, thrombosis, failure to mature, and need for additional procedures.   The patient agrees to proceed with the procedure.    Dagoberto Ligas, PA-C [facility] [phone]  Clinic MD:   Donzetta Matters

## 2021-03-23 ENCOUNTER — Encounter (HOSPITAL_COMMUNITY): Payer: Self-pay | Admitting: Vascular Surgery

## 2021-03-23 NOTE — Anesthesia Preprocedure Evaluation (Addendum)
Anesthesia Evaluation  Patient identified by MRN, date of birth, ID band Patient awake    Reviewed: Allergy & Precautions, NPO status , Patient's Chart, lab work & pertinent test results, reviewed documented beta blocker date and time   Airway Mallampati: III  TM Distance: >3 FB Neck ROM: Full    Dental no notable dental hx. (+) Teeth Intact, Dental Advisory Given   Pulmonary shortness of breath and with exertion,    Pulmonary exam normal breath sounds clear to auscultation       Cardiovascular hypertension, Pt. on medications and Pt. on home beta blockers +CHF (grade 2 diastolic dysfunction)  Normal cardiovascular exam+ Valvular Problems/Murmurs (mild-mod MR) MR  Rhythm:Regular Rate:Normal  Echo 12/2020: 1. Left ventricular ejection fraction, by estimation, is 55 to 60%. The  left ventricle has normal function. The left ventricle has no regional  wall motion abnormalities. There is moderate left ventricular hypertrophy.  Left ventricular diastolic  parameters are consistent with Grade II diastolic dysfunction  (pseudonormalization). The average left ventricular global longitudinal  strain is -13.4 %. The global longitudinal strain is abnormal.  2. Right ventricular systolic function is normal. The right ventricular  size is mildly enlarged. There is normal pulmonary artery systolic  pressure. The estimated right ventricular systolic pressure is 16.1 mmHg.  3. Left atrial size was mildly dilated.  4. The mitral valve is normal in structure. Mild to moderate mitral valve  regurgitation. No evidence of mitral stenosis.  5. Tricuspid valve regurgitation is mild to moderate.  6. The aortic valve is tricuspid. Aortic valve regurgitation is trivial.  No aortic stenosis is present.  7. The inferior vena cava is dilated in size with >50% respiratory  variability, suggesting right atrial pressure of 8 mmHg.     Neuro/Psych negative neurological ROS  negative psych ROS   GI/Hepatic Neg liver ROS, GERD  Controlled,  Endo/Other  diabetes, Well Controlled, Type 2, Insulin DependentInsulin pump on, FS 230 this AM- has basal rate at 80% currently   Renal/GU ESRF and DialysisRenal disease (T/Th/Sat)Last HD yesterday, being dialyzed through right chest wall catheter  negative genitourinary   Musculoskeletal negative musculoskeletal ROS (+)   Abdominal   Peds  Hematology  (+) Blood dyscrasia, Sickle cell trait and anemia ,   Anesthesia Other Findings   Reproductive/Obstetrics negative OB ROS Urine preg neg                            Anesthesia Physical Anesthesia Plan  ASA: 3  Anesthesia Plan: MAC and Regional   Post-op Pain Management:    Induction:   PONV Risk Score and Plan: 2 and Propofol infusion and TIVA  Airway Management Planned: Natural Airway and Simple Face Mask  Additional Equipment: None  Intra-op Plan:   Post-operative Plan:   Informed Consent: I have reviewed the patients History and Physical, chart, labs and discussed the procedure including the risks, benefits and alternatives for the proposed anesthesia with the patient or authorized representative who has indicated his/her understanding and acceptance.     Dental advisory given  Plan Discussed with: CRNA  Anesthesia Plan Comments:        Anesthesia Quick Evaluation

## 2021-03-23 NOTE — Progress Notes (Signed)
DUE TO COVID-19 ONLY ONE VISITOR IS ALLOWED TO COME WITH YOU AND STAY IN THE WAITING ROOM ONLY DURING PRE OP AND PROCEDURE DAY OF SURGERY.  PCP - Dr Jacelyn Pi Cardiologist - n/a  Chest x-ray - 01/15/21 (1V) EKG - 01/11/21 Stress Test - 01/27/21 ([place]) ECHO - 01/03/21 Cardiac Cath - n/a  Sleep Study -  n/a CPAP - none  Fasting Blood Sugar - 150s-200s Checks Blood Sugar 4 times a day  Patient has a Humalog Insulin Pump.  Instructed patient to reduce all basal rates by 20% at midnight or contact Dr Chalmers Cater for instructions.  If your blood sugar is less than 70 mg/dL, you will need to treat for low blood sugar: Treat a low blood sugar (less than 70 mg/dL) with  cup of clear juice (cranberry or apple), 4 glucose tablets, OR glucose gel. Recheck blood sugar in 15 minutes after treatment (to make sure it is greater than 70 mg/dL). If your blood sugar is not greater than 70 mg/dL on recheck, call [phone] for further instructions.  Anesthesia review: Yes  STOP now taking any Aspirin (unless otherwise instructed by your surgeon), Aleve, Naproxen, Ibuprofen, Motrin, Advil, Goody's, BC's, all herbal medications, fish oil, and all vitamins.   Coronavirus Screening Covid test n/a - Ambulatory Surgery  Do you have any of the following symptoms:  Cough yes/no: No Fever (>100.54F)  yes/no: No Runny nose yes/no: No Sore throat yes/no: No Difficulty breathing/shortness of breath  yes/no: No  Have you traveled in the last 14 days and where? yes/no: No  Patient verbalized understanding of instructions that were given via phone.

## 2021-03-24 ENCOUNTER — Ambulatory Visit (HOSPITAL_COMMUNITY)
Admission: RE | Admit: 2021-03-24 | Discharge: 2021-03-24 | Disposition: A | Discharge status: Home / Self Care | Payer: No Typology Code available for payment source | Attending: Vascular Surgery | Admitting: Vascular Surgery

## 2021-03-24 ENCOUNTER — Ambulatory Visit (HOSPITAL_COMMUNITY): Payer: No Typology Code available for payment source | Admitting: Physician Assistant

## 2021-03-24 ENCOUNTER — Other Ambulatory Visit: Payer: Self-pay

## 2021-03-24 ENCOUNTER — Encounter (HOSPITAL_COMMUNITY)
Admission: RE | Disposition: A | Discharge status: Home / Self Care | Payer: Self-pay | Source: Home / Self Care | Attending: Vascular Surgery

## 2021-03-24 DIAGNOSIS — Z79899 Other long term (current) drug therapy: Secondary | ICD-10-CM | POA: Insufficient documentation

## 2021-03-24 DIAGNOSIS — N185 Chronic kidney disease, stage 5: Secondary | ICD-10-CM | POA: Diagnosis not present

## 2021-03-24 DIAGNOSIS — E1122 Type 2 diabetes mellitus with diabetic chronic kidney disease: Secondary | ICD-10-CM | POA: Insufficient documentation

## 2021-03-24 DIAGNOSIS — Z888 Allergy status to other drugs, medicaments and biological substances status: Secondary | ICD-10-CM | POA: Insufficient documentation

## 2021-03-24 DIAGNOSIS — Z992 Dependence on renal dialysis: Secondary | ICD-10-CM | POA: Diagnosis not present

## 2021-03-24 DIAGNOSIS — T82898A Other specified complication of vascular prosthetic devices, implants and grafts, initial encounter: Secondary | ICD-10-CM | POA: Diagnosis not present

## 2021-03-24 DIAGNOSIS — Z452 Encounter for adjustment and management of vascular access device: Secondary | ICD-10-CM | POA: Diagnosis present

## 2021-03-24 DIAGNOSIS — Z794 Long term (current) use of insulin: Secondary | ICD-10-CM | POA: Insufficient documentation

## 2021-03-24 DIAGNOSIS — N186 End stage renal disease: Secondary | ICD-10-CM | POA: Diagnosis not present

## 2021-03-24 HISTORY — DX: Anemia, unspecified: D64.9

## 2021-03-24 HISTORY — DX: Chronic kidney disease, unspecified: N18.9

## 2021-03-24 HISTORY — PX: BASCILIC VEIN TRANSPOSITION: SHX5742

## 2021-03-24 HISTORY — DX: Myoneural disorder, unspecified: G70.9

## 2021-03-24 LAB — GLUCOSE, CAPILLARY
Glucose-Capillary: 230 mg/dL — ABNORMAL HIGH (ref 70–99)
Glucose-Capillary: 308 mg/dL — ABNORMAL HIGH (ref 70–99)
Glucose-Capillary: 346 mg/dL — ABNORMAL HIGH (ref 70–99)
Glucose-Capillary: 234 mg/dL — ABNORMAL HIGH (ref 70–99)
Glucose-Capillary: 311 mg/dL — ABNORMAL HIGH (ref 70–99)

## 2021-03-24 LAB — POCT I-STAT, CHEM 8
BUN: 30 mg/dL — ABNORMAL HIGH (ref 6–20)
Calcium, Ion: 0.87 mmol/L — CL (ref 1.15–1.40)
Chloride: 100 mmol/L (ref 98–111)
Creatinine, Ser: 5.1 mg/dL — ABNORMAL HIGH (ref 0.44–1.00)
Glucose, Bld: 241 mg/dL — ABNORMAL HIGH (ref 70–99)
HCT: 29 % — ABNORMAL LOW (ref 36.0–46.0)
Hemoglobin: 9.9 g/dL — ABNORMAL LOW (ref 12.0–15.0)
Potassium: 3.8 mmol/L (ref 3.5–5.1)
Sodium: 139 mmol/L (ref 135–145)
TCO2: 26 mmol/L (ref 22–32)

## 2021-03-24 LAB — POCT PREGNANCY, URINE: Preg Test, Ur: NEGATIVE

## 2021-03-24 SURGERY — TRANSPOSITION, VEIN, BASILIC
Anesthesia: Monitor Anesthesia Care | Site: Arm Upper | Laterality: Left

## 2021-03-24 MED ORDER — MEPERIDINE HCL 25 MG/ML IJ SOLN: 6.2500 mg | INTRAMUSCULAR | Status: DC | PRN

## 2021-03-24 MED ORDER — OXYCODONE HCL 5 MG PO TABS: 5.0000 mg | ORAL_TABLET | Freq: Once | ORAL | Status: DC | PRN

## 2021-03-24 MED ORDER — FENTANYL CITRATE (PF) 100 MCG/2ML IJ SOLN
100.0000 ug | Freq: Once | INTRAMUSCULAR | Status: AC
Start: 2021-03-24 — End: 2021-03-24

## 2021-03-24 MED ORDER — 0.9 % SODIUM CHLORIDE (POUR BTL) OPTIME
TOPICAL | Status: DC | PRN
  Administered 2021-03-24: 1000 mL

## 2021-03-24 MED ORDER — HEPARIN 6000 UNIT IRRIGATION SOLUTION
Status: DC | PRN
  Administered 2021-03-24: 1

## 2021-03-24 MED ORDER — OXYCODONE-ACETAMINOPHEN 10-325 MG PO TABS: 1.0000 | ORAL_TABLET | Freq: Four times a day (QID) | ORAL | 0 refills | Status: DC | PRN

## 2021-03-24 MED ORDER — PROPOFOL 1000 MG/100ML IV EMUL
INTRAVENOUS | Status: AC
  Filled 2021-03-24: qty 200

## 2021-03-24 MED ORDER — MIDAZOLAM HCL 5 MG/5ML IJ SOLN
INTRAMUSCULAR | Status: DC | PRN
  Administered 2021-03-24 (×2): 1 mg via INTRAVENOUS

## 2021-03-24 MED ORDER — ACETAMINOPHEN 500 MG PO TABS: 1000.0000 mg | ORAL_TABLET | Freq: Once | ORAL | Status: AC

## 2021-03-24 MED ORDER — HYDROMORPHONE HCL 1 MG/ML IJ SOLN: 0.2500 mg | INTRAMUSCULAR | Status: DC | PRN

## 2021-03-24 MED ORDER — MIDAZOLAM HCL 2 MG/2ML IJ SOLN
INTRAMUSCULAR | Status: AC
  Administered 2021-03-24: 2 mg via INTRAVENOUS
  Filled 2021-03-24: qty 2

## 2021-03-24 MED ORDER — CHLORHEXIDINE GLUCONATE 4 % EX LIQD: 60.0000 mL | Freq: Once | CUTANEOUS | Status: DC

## 2021-03-24 MED ORDER — MIDAZOLAM HCL 2 MG/2ML IJ SOLN
INTRAMUSCULAR | Status: AC
  Filled 2021-03-24: qty 2

## 2021-03-24 MED ORDER — CHLORHEXIDINE GLUCONATE 0.12 % MT SOLN
OROMUCOSAL | Status: AC
  Administered 2021-03-24: 15 mL
  Filled 2021-03-24: qty 15

## 2021-03-24 MED ORDER — ONDANSETRON HCL 4 MG/2ML IJ SOLN
INTRAMUSCULAR | Status: AC
  Filled 2021-03-24: qty 2

## 2021-03-24 MED ORDER — HEPARIN 6000 UNIT IRRIGATION SOLUTION
Status: AC
  Filled 2021-03-24: qty 500

## 2021-03-24 MED ORDER — FENTANYL CITRATE (PF) 100 MCG/2ML IJ SOLN
INTRAMUSCULAR | Status: AC
  Administered 2021-03-24: 100 ug via INTRAVENOUS
  Filled 2021-03-24: qty 2

## 2021-03-24 MED ORDER — CEFAZOLIN SODIUM-DEXTROSE 2-4 GM/100ML-% IV SOLN
2.0000 g | INTRAVENOUS | Status: AC
  Administered 2021-03-24: 2 g via INTRAVENOUS

## 2021-03-24 MED ORDER — LIDOCAINE HCL (CARDIAC) PF 100 MG/5ML IV SOSY
PREFILLED_SYRINGE | INTRAVENOUS | Status: DC | PRN
  Administered 2021-03-24: 50 mg via INTRAVENOUS

## 2021-03-24 MED ORDER — LIDOCAINE-EPINEPHRINE (PF) 1.5 %-1:200000 IJ SOLN
INTRAMUSCULAR | Status: DC | PRN
  Administered 2021-03-24: 30 mL via PERINEURAL

## 2021-03-24 MED ORDER — CEFAZOLIN SODIUM-DEXTROSE 2-4 GM/100ML-% IV SOLN
INTRAVENOUS | Status: AC
  Filled 2021-03-24: qty 100

## 2021-03-24 MED ORDER — ACETAMINOPHEN 500 MG PO TABS
ORAL_TABLET | ORAL | Status: AC
  Administered 2021-03-24: 1000 mg via ORAL
  Filled 2021-03-24: qty 2

## 2021-03-24 MED ORDER — FENTANYL CITRATE (PF) 250 MCG/5ML IJ SOLN
INTRAMUSCULAR | Status: AC
  Filled 2021-03-24: qty 5

## 2021-03-24 MED ORDER — PROPOFOL 500 MG/50ML IV EMUL
INTRAVENOUS | Status: DC | PRN
  Administered 2021-03-24: 50 ug/kg/min via INTRAVENOUS

## 2021-03-24 MED ORDER — OXYCODONE HCL 5 MG/5ML PO SOLN: 5.0000 mg | Freq: Once | ORAL | Status: DC | PRN

## 2021-03-24 MED ORDER — LIDOCAINE-EPINEPHRINE 1 %-1:100000 IJ SOLN
INTRAMUSCULAR | Status: AC
  Filled 2021-03-24: qty 1

## 2021-03-24 MED ORDER — PROMETHAZINE HCL 25 MG/ML IJ SOLN: 6.2500 mg | INTRAMUSCULAR | Status: DC | PRN

## 2021-03-24 MED ORDER — FENTANYL CITRATE (PF) 100 MCG/2ML IJ SOLN
INTRAMUSCULAR | Status: DC | PRN
  Administered 2021-03-24: 50 ug via INTRAVENOUS

## 2021-03-24 MED ORDER — SODIUM CHLORIDE 0.9 % IV SOLN: INTRAVENOUS | Status: DC

## 2021-03-24 MED ORDER — MIDAZOLAM HCL 2 MG/2ML IJ SOLN: 2.0000 mg | Freq: Once | INTRAMUSCULAR | Status: AC

## 2021-03-24 SURGICAL SUPPLY — 39 items
ARMBAND PINK RESTRICT EXTREMIT (MISCELLANEOUS) ×3 IMPLANT
BAG COUNTER SPONGE SURGICOUNT (BAG) ×2 IMPLANT
BAG SURGICOUNT SPONGE COUNTING (BAG) ×1
CANISTER SUCT 3000ML PPV (MISCELLANEOUS) ×3 IMPLANT
CLIP LIGATING EXTRA MED SLVR (CLIP) ×3 IMPLANT
CLIP LIGATING EXTRA SM BLUE (MISCELLANEOUS) ×3 IMPLANT
CLIP VESOCCLUDE MED 6/CT (CLIP) IMPLANT
CLIP VESOCCLUDE SM WIDE 24/CT (CLIP) IMPLANT
CLIP VESOCCLUDE SM WIDE 6/CT (CLIP) IMPLANT
COVER PROBE W GEL 5X96 (DRAPES) ×3 IMPLANT
DERMABOND ADHESIVE PROPEN (GAUZE/BANDAGES/DRESSINGS) ×2
DERMABOND ADVANCED (GAUZE/BANDAGES/DRESSINGS) ×2
DERMABOND ADVANCED .7 DNX12 (GAUZE/BANDAGES/DRESSINGS) ×1 IMPLANT
DERMABOND ADVANCED .7 DNX6 (GAUZE/BANDAGES/DRESSINGS) ×1 IMPLANT
ELECT REM PT RETURN 9FT ADLT (ELECTROSURGICAL) ×3
ELECTRODE REM PT RTRN 9FT ADLT (ELECTROSURGICAL) ×1 IMPLANT
GAUZE 4X4 16PLY ~~LOC~~+RFID DBL (SPONGE) ×3 IMPLANT
GLOVE SURG ENC MOIS LTX SZ7.5 (GLOVE) ×3 IMPLANT
GLOVE SURG UNDER POLY LF SZ6.5 (GLOVE) ×3 IMPLANT
GOWN STRL REUS W/ TWL LRG LVL3 (GOWN DISPOSABLE) ×2 IMPLANT
GOWN STRL REUS W/ TWL XL LVL3 (GOWN DISPOSABLE) ×1 IMPLANT
GOWN STRL REUS W/TWL LRG LVL3 (GOWN DISPOSABLE) ×4
GOWN STRL REUS W/TWL XL LVL3 (GOWN DISPOSABLE) ×2
KIT BASIN OR (CUSTOM PROCEDURE TRAY) ×3 IMPLANT
KIT TURNOVER KIT B (KITS) ×3 IMPLANT
NS IRRIG 1000ML POUR BTL (IV SOLUTION) ×3 IMPLANT
PACK CV ACCESS (CUSTOM PROCEDURE TRAY) ×3 IMPLANT
PAD ARMBOARD 7.5X6 YLW CONV (MISCELLANEOUS) ×6 IMPLANT
SPONGE T-LAP 18X18 ~~LOC~~+RFID (SPONGE) ×3 IMPLANT
SUT MNCRL AB 4-0 PS2 18 (SUTURE) ×6 IMPLANT
SUT PROLENE 6 0 BV (SUTURE) ×3 IMPLANT
SUT SILK 2 0 SH (SUTURE) IMPLANT
SUT SILK 3 0 (SUTURE) ×2
SUT SILK 3-0 18XBRD TIE 12 (SUTURE) ×1 IMPLANT
SUT VIC AB 3-0 SH 27 (SUTURE) ×4
SUT VIC AB 3-0 SH 27X BRD (SUTURE) ×2 IMPLANT
TOWEL GREEN STERILE (TOWEL DISPOSABLE) ×3 IMPLANT
UNDERPAD 30X36 HEAVY ABSORB (UNDERPADS AND DIAPERS) ×3 IMPLANT
WATER STERILE IRR 1000ML POUR (IV SOLUTION) ×3 IMPLANT

## 2021-03-24 NOTE — Progress Notes (Signed)
CBG 346 when arrived to PACU. Pt adjusted insulin pump back to her normal dose. Will reassess in 30 minutes.

## 2021-03-24 NOTE — Discharge Instructions (Signed)
° °  [facility] [place] ° °Discharge Instructions ° °AV Fistula or Graft Surgery for Dialysis Access ° °Please refer to the following instructions for your post-procedure care. Your surgeon or physician assistant will discuss any changes with you. ° °Activity ° °You may drive the day following your surgery, if you are comfortable and no longer taking prescription pain medication. Resume full activity as the soreness in your incision resolves. ° °Bathing/Showering ° °You may shower after you go home. Keep your incision dry for 48 hours. Do not soak in a bathtub, hot tub, or swim until the incision heals completely. You may not shower if you have a hemodialysis catheter. ° °Incision Care ° °Clean your incision with mild soap and water after 48 hours. Pat the area dry with a clean towel. You do not need a bandage unless otherwise instructed. Do not apply any ointments or creams to your incision. You may have skin glue on your incision. Do not peel it off. It will come off on its own in about one week. Your arm may swell a bit after surgery. To reduce swelling use pillows to elevate your arm so it is above your heart. Your doctor will tell you if you need to lightly wrap your arm with an ACE bandage. ° °Diet ° °Resume your normal diet. There are not special food restrictions following this procedure. In order to heal from your surgery, it is CRITICAL to get adequate nutrition. Your body requires vitamins, minerals, and protein. Vegetables are the best source of vitamins and minerals. Vegetables also provide the perfect balance of protein. Processed food has little nutritional value, so try to avoid this. ° °Medications ° °Resume taking all of your medications. If your incision is causing pain, you may take over-the counter pain relievers such as acetaminophen (Tylenol). If you were prescribed a stronger pain medication, please be aware these medications can cause nausea and constipation. Prevent  nausea by taking the medication with a snack or meal. Avoid constipation by drinking plenty of fluids and eating foods with high amount of fiber, such as fruits, vegetables, and grains. Do not take Tylenol if you are taking prescription pain medications. ° ° ° ° °Follow up °Your surgeon may want to see you in the office following your access surgery. If so, this will be arranged at the time of your surgery. ° °Please call us immediately for any of the following conditions: ° °Increased pain, redness, drainage (pus) from your incision site °Fever of 101 degrees or higher °Severe or worsening pain at your incision site °Hand pain or numbness. ° °Reduce your risk of vascular disease: ° °Stop smoking. If you would like help, call [facility] at 1-800-QUIT-NOW (1-800-784-8669) or [facility] at [phone] ° °Manage your cholesterol °Maintain a desired weight °Control your diabetes °Keep your blood pressure down ° °Dialysis ° °It will take several weeks to several months for your new dialysis access to be ready for use. Your surgeon will determine when it is OK to use it. Your nephrologist will continue to direct your dialysis. You can continue to use your Permcath until your new access is ready for use. ° °If you have any questions, please call the office at [phone]. ° °

## 2021-03-24 NOTE — Progress Notes (Signed)
CBG 234. Dr Roanna Banning assessed at bedside and said pt is stable to go home. Pt feels comfortable adjusting insulin pump and monitoring from home.

## 2021-03-24 NOTE — Anesthesia Postprocedure Evaluation (Signed)
Anesthesia Post Note  Patient: [name]  Procedure(s) Performed: LEFT ARM SECOND STAGE BASCILIC VEIN TRANSPOSITION (Left: Arm Upper)     Patient location during evaluation: PACU Anesthesia Type: Regional Level of consciousness: awake and alert Pain management: pain level controlled Vital Signs Assessment: post-procedure vital signs reviewed and stable Respiratory status: spontaneous breathing, nonlabored ventilation, respiratory function stable and patient connected to nasal cannula oxygen Cardiovascular status: stable and blood pressure returned to baseline Postop Assessment: no apparent nausea or vomiting Anesthetic complications: no   No notable events documented.  Last Vitals:  Vitals:   03/24/21 1455 03/24/21 1510  BP: 140/80 131/81  Pulse: 80 80  Resp: 11 13  Temp:  (!) 36.4 C  SpO2: 100% 100%    Last Pain:  Vitals:   03/24/21 1510  TempSrc:   PainSc: 0-No pain                 Carrolyn Hilmes P Amore Ackman

## 2021-03-24 NOTE — Interval H&P Note (Signed)
History and Physical Interval Note:  03/24/2021 11:49 AM  Kimberly [name]  has presented today for surgery, with the diagnosis of ESRD.  The various methods of treatment have been discussed with the patient and family. After consideration of risks, benefits and other options for treatment, the patient has consented to  Procedure(s): LEFT ARM SECOND STAGE [place] (Left) as a surgical intervention.  The patient's history has been reviewed, patient examined, no change in status, stable for surgery.  I have reviewed the patient's chart and labs.  Questions were answered to the patient's satisfaction.     Servando Snare

## 2021-03-24 NOTE — Progress Notes (Signed)
Patient's CBG 308.  Patient entered CBG in insulin pump and it self administered the appropriate insulin to cover CBG 308 at the reduced basal rate.  Spoke with Diabetes Coordinator who is following patient.

## 2021-03-24 NOTE — Transfer of Care (Signed)
Immediate Anesthesia Transfer of Care Note  Patient: Kimberly [name]  Procedure(s) Performed: LEFT ARM SECOND STAGE BASCILIC VEIN TRANSPOSITION (Left: Arm Upper)  Patient Location: PACU  Anesthesia Type:MAC  Level of Consciousness: awake, alert , oriented and patient cooperative  Airway & Oxygen Therapy: Patient Spontanous Breathing  Post-op Assessment: Report given to RN, Post -op Vital signs reviewed and stable and Patient moving all extremities X 4  Post vital signs: Reviewed and stable  Last Vitals:  Vitals Value Taken Time  BP 126/80 03/24/21 1408  Temp    Pulse 80 03/24/21 1408  Resp 11 03/24/21 1408  SpO2 99 % 03/24/21 1408  Vitals shown include unvalidated device data.  Last Pain:  Vitals:   03/24/21 1200  TempSrc:   PainSc: 0-No pain         Complications: No notable events documented.

## 2021-03-24 NOTE — Op Note (Signed)
    Patient name: Kimberly [name] MRN: [id number] DOB: [birth year] Sex: female  03/24/2021 Pre-operative Diagnosis: ESRD Post-operative diagnosis:  Same Surgeon:  Erlene Quan C. Donzetta Matters, MD Assistant: Risa Grill, PA Procedure Performed:  Revision of left arm basilic vein av fistula with transposition  Indications: 30 year old female with end-stage renal disease currently dialyzing via catheter.  She has a previous first stage basilic vein fistula which has matured nicely and she is indicated for revision with transposition.  Findings: Fistula throughout the course measured approximately 1 cm.  At completion there was a strong thrill in the fistula.  The radial artery pulse was palpable with compression of the fistula.   Procedure:  The patient was identified in the holding area and taken to the operating where she was put supine on operating table.  Preoperative block in place.  She was sterilely prepped and draped in left upper extremity usual fashion, antibiotics were ministered timeout was called.  The block was tested noted be intact.  We opened the previous incision near the antecubitum.  We dissected out the fistula back to the level of the anastomosis and marked if orientation.  Through the 2 separate skip incisions we dissected out the entire to the fistula dividing branches between clips and ties.  When the fistula was totally dissected free we marked for orientation in the axilla.  We then clamped near the arteriovenous anastomosis and transected the fistula.  We flushed with heparinized saline clamped and flushed with heparinized saline again he was noted to have no leaks.  We tunneled it laterally maintaining its orientation.  We spatulated both ends of them end-to-end with 6-0 Prolene suture.  Prior completion left lateral directions.  Upon completion there was a very strong thrill in the fistula.  Made a palpable radial artery pulse with compression of the fistula.  We irrigated the wounds and  we can obtain hemostasis we closed in layers of Vicryl Monocryl.  She was awakened from anesthesia having tolerated procedure without any complication.  All counts were correct at completion.  EBL: 100 cc   Terryl Molinelli C. Donzetta Matters, MD [facility] Office: [phone] Pager: [phone]

## 2021-03-24 NOTE — Anesthesia Procedure Notes (Signed)
Anesthesia Regional Block: Supraclavicular block   Pre-Anesthetic Checklist: , timeout performed,  Correct Patient, Correct Site, Correct Laterality,  Correct Procedure, Correct Position, site marked,  Risks and benefits discussed,  Surgical consent,  Pre-op evaluation,  At surgeon's request and post-op pain management  Laterality: Left  Prep: Maximum Sterile Barrier Precautions used, chloraprep       Needles:  Injection technique: Single-shot  Needle Type: Echogenic Stimulator Needle     Needle Length: 9cm  Needle Gauge: 22     Additional Needles:   Procedures:,,,, ultrasound used (permanent image in chart),,    Narrative:  Start time: 03/24/2021 11:50 AM End time: 03/24/2021 11:55 AM Injection made incrementally with aspirations every 5 mL.  Performed by: Personally  Anesthesiologist: Pervis Hocking, DO  Additional Notes: Monitors applied. No increased pain on injection. No increased resistance to injection. Injection made in 5cc increments. Good needle visualization. Patient tolerated procedure well.

## 2021-03-25 ENCOUNTER — Encounter (HOSPITAL_COMMUNITY): Payer: Self-pay | Admitting: Vascular Surgery

## 2021-05-20 ENCOUNTER — Encounter: Payer: Self-pay | Admitting: Physician Assistant

## 2021-05-20 ENCOUNTER — Other Ambulatory Visit: Payer: Self-pay

## 2021-05-20 ENCOUNTER — Ambulatory Visit (INDEPENDENT_AMBULATORY_CARE_PROVIDER_SITE_OTHER): Payer: No Typology Code available for payment source | Admitting: Physician Assistant

## 2021-05-20 VITALS — BP 158/87 | HR 89 | Temp 97.9°F | Resp 20 | Ht 65.0 in | Wt 151.8 lb

## 2021-05-20 DIAGNOSIS — N186 End stage renal disease: Secondary | ICD-10-CM

## 2021-05-20 NOTE — Progress Notes (Signed)
    Postoperative Access Visit   History of Present Illness   Kimberly [name] is a 30 y.o. year old female who presents for postoperative follow-up for:  Revision of left arm basilic vein AV fistula with transposition on 03/24/21 by Dr. Donzetta Matters. The patient's wounds are well healed.  The patient notes no steal symptoms.  The patient is  able to complete their activities of daily living.    She dialyzes currently via a right IJ TDC on Tues/ Thurs/ Sat at the [facility]  Physical Examination   Vitals:   05/20/21 1332  BP: (!) 158/87  Pulse: 89  Resp: 20  Temp: 97.9 F (36.6 C)  TempSrc: Temporal  SpO2: 100%  Weight: 151 lb 12.8 oz (68.9 kg)  Height: '5\' 5"'$  (1.651 m)   Body mass index is 25.26 kg/m.  left arm Incision is well healed, 2+ radial pulse, hand grip is 5/5, sensation in digits is intact, palpable thrill, bruit can be auscultated     Medical Decision Making   Kimberly [name] is a 30 y.o. year old female who presents s/p Revision of left arm basilic vein AV fistula with transposition on 03/24/21 by Dr. Donzetta Matters.  Patent is without signs or symptoms of steal syndrome The patient's access will be ready for use immediately The patient's tunneled dialysis catheter can be removed when Nephrology is comfortable with the performance of the left AV fistula The patient may follow up on a prn basis   Karoline Caldwell, PA-C [facility] [place]: [phone]  Clinic MD: Scot Dock
